# Patient Record
Sex: Female | Born: 1970
Health system: Southern US, Community
[De-identification: ages and names within clinical notes are randomized; demographics above are authoritative.]

## PROBLEM LIST (undated history)

## (undated) DIAGNOSIS — F32A Depression, unspecified: Secondary | ICD-10-CM

## (undated) DIAGNOSIS — N809 Endometriosis, unspecified: Secondary | ICD-10-CM

## (undated) DIAGNOSIS — K219 Gastro-esophageal reflux disease without esophagitis: Secondary | ICD-10-CM

## (undated) DIAGNOSIS — N6019 Diffuse cystic mastopathy of unspecified breast: Secondary | ICD-10-CM

## (undated) DIAGNOSIS — J45909 Unspecified asthma, uncomplicated: Secondary | ICD-10-CM

## (undated) DIAGNOSIS — R51 Headache: Secondary | ICD-10-CM

## (undated) DIAGNOSIS — F419 Anxiety disorder, unspecified: Secondary | ICD-10-CM

## (undated) DIAGNOSIS — R519 Headache, unspecified: Secondary | ICD-10-CM

## (undated) DIAGNOSIS — F329 Major depressive disorder, single episode, unspecified: Secondary | ICD-10-CM

## (undated) HISTORY — PX: EYE SURGERY: SHX253

## (undated) HISTORY — DX: Gastro-esophageal reflux disease without esophagitis: K21.9

## (undated) HISTORY — PX: ABDOMINAL HYSTERECTOMY: SHX81

## (undated) HISTORY — DX: Endometriosis, unspecified: N80.9

## (undated) HISTORY — DX: Unspecified asthma, uncomplicated: J45.909

## (undated) HISTORY — PX: OTHER SURGICAL HISTORY: SHX169

## (undated) HISTORY — DX: Diffuse cystic mastopathy of unspecified breast: N60.19

---

## 2008-10-23 ENCOUNTER — Ambulatory Visit: Payer: Self-pay | Admitting: Family Medicine

## 2009-12-12 DIAGNOSIS — J45909 Unspecified asthma, uncomplicated: Secondary | ICD-10-CM

## 2009-12-12 HISTORY — DX: Unspecified asthma, uncomplicated: J45.909

## 2009-12-16 DIAGNOSIS — K219 Gastro-esophageal reflux disease without esophagitis: Secondary | ICD-10-CM | POA: Insufficient documentation

## 2010-02-02 ENCOUNTER — Ambulatory Visit: Payer: Self-pay | Admitting: Family Medicine

## 2010-05-03 ENCOUNTER — Emergency Department: Payer: Self-pay | Admitting: Emergency Medicine

## 2011-10-31 ENCOUNTER — Ambulatory Visit: Payer: Self-pay | Admitting: Family Medicine

## 2012-07-17 ENCOUNTER — Ambulatory Visit: Payer: Self-pay | Admitting: Family Medicine

## 2012-07-19 HISTORY — PX: UPPER GI ENDOSCOPY: SHX6162

## 2012-09-12 DIAGNOSIS — H16409 Unspecified corneal neovascularization, unspecified eye: Secondary | ICD-10-CM | POA: Insufficient documentation

## 2013-01-07 HISTORY — PX: CORNEAL TRANSPLANT: SHX108

## 2013-10-02 ENCOUNTER — Encounter: Payer: Self-pay | Admitting: General Surgery

## 2013-10-28 ENCOUNTER — Ambulatory Visit: Payer: Self-pay | Admitting: General Surgery

## 2013-11-21 ENCOUNTER — Ambulatory Visit: Payer: Self-pay | Admitting: Family Medicine

## 2013-11-21 LAB — HM MAMMOGRAPHY
HM Mammogram: ABNORMAL
HM Mammogram: ABNORMAL

## 2014-04-09 ENCOUNTER — Ambulatory Visit: Payer: Self-pay | Admitting: Family Medicine

## 2014-08-04 ENCOUNTER — Ambulatory Visit: Payer: Self-pay | Admitting: Family Medicine

## 2014-08-08 LAB — LIPID PANEL
Cholesterol: 197 mg/dL (ref 0–200)
HDL: 58 mg/dL (ref 35–70)
LDL Cholesterol: 124 mg/dL
Triglycerides: 74 mg/dL (ref 40–160)

## 2014-08-08 LAB — HM PAP SMEAR: HM PAP: NORMAL

## 2014-09-29 ENCOUNTER — Ambulatory Visit: Payer: Self-pay | Admitting: Family Medicine

## 2015-03-31 ENCOUNTER — Encounter: Payer: Self-pay | Admitting: Family Medicine

## 2015-05-13 ENCOUNTER — Ambulatory Visit (INDEPENDENT_AMBULATORY_CARE_PROVIDER_SITE_OTHER): Payer: Managed Care, Other (non HMO) | Admitting: Family Medicine

## 2015-05-13 ENCOUNTER — Encounter: Payer: Self-pay | Admitting: Family Medicine

## 2015-05-13 VITALS — BP 122/78 | HR 79 | Temp 98.0°F | Resp 16 | Ht 65.0 in | Wt 123.7 lb

## 2015-05-13 DIAGNOSIS — R350 Frequency of micturition: Secondary | ICD-10-CM | POA: Diagnosis not present

## 2015-05-13 DIAGNOSIS — R1010 Upper abdominal pain, unspecified: Secondary | ICD-10-CM

## 2015-05-13 DIAGNOSIS — R109 Unspecified abdominal pain: Secondary | ICD-10-CM | POA: Insufficient documentation

## 2015-05-13 DIAGNOSIS — R110 Nausea: Secondary | ICD-10-CM

## 2015-05-13 LAB — POCT URINALYSIS DIPSTICK
BILIRUBIN UA: NEGATIVE
GLUCOSE UA: NEGATIVE
KETONES UA: NEGATIVE
Leukocytes, UA: NEGATIVE
Nitrite, UA: NEGATIVE
PH UA: 6
Protein, UA: NEGATIVE
RBC UA: NEGATIVE
Spec Grav, UA: 1.01
Urobilinogen, UA: 0.2

## 2015-05-13 MED ORDER — CIPROFLOXACIN HCL 500 MG PO TABS: 500.0000 mg | ORAL_TABLET | Freq: Two times a day (BID) | ORAL | Status: DC

## 2015-05-13 MED ORDER — NITROFURANTOIN MONOHYD MACRO 100 MG PO CAPS: 100.0000 mg | ORAL_CAPSULE | Freq: Two times a day (BID) | ORAL | Status: DC

## 2015-05-13 MED ORDER — PROMETHAZINE HCL 25 MG PO TABS: 25.0000 mg | ORAL_TABLET | Freq: Two times a day (BID) | ORAL | Status: DC | PRN

## 2015-05-13 NOTE — Patient Instructions (Signed)
Pyelonephritis, Adult °Pyelonephritis is a kidney infection. In general, there are 2 main types of pyelonephritis: °· Infections that come on quickly without any warning (acute pyelonephritis). °· Infections that persist for a long period of time (chronic pyelonephritis). °CAUSES  °Two main causes of pyelonephritis are: °· Bacteria traveling from the bladder to the kidney. This is a problem especially in pregnant women. The urine in the bladder can become filled with bacteria from multiple causes, including: °¨ Inflammation of the prostate gland (prostatitis). °¨ Sexual intercourse in females. °¨ Bladder infection (cystitis). °· Bacteria traveling from the bloodstream to the tissue part of the kidney. °Problems that may increase your risk of getting a kidney infection include: °· Diabetes. °· Kidney stones or bladder stones. °· Cancer. °· Catheters placed in the bladder. °· Other abnormalities of the kidney or ureter. °SYMPTOMS  °· Abdominal pain. °· Pain in the side or flank area. °· Fever. °· Chills. °· Upset stomach. °· Blood in the urine (dark urine). °· Frequent urination. °· Strong or persistent urge to urinate. °· Burning or stinging when urinating. °DIAGNOSIS  °Your caregiver may diagnose your kidney infection based on your symptoms. A urine sample may also be taken. °TREATMENT  °In general, treatment depends on how severe the infection is.  °· If the infection is mild and caught early, your caregiver may treat you with oral antibiotics and send you home. °· If the infection is more severe, the bacteria may have gotten into the bloodstream. This will require intravenous (IV) antibiotics and a hospital stay. Symptoms may include: °¨ High fever. °¨ Severe flank pain. °¨ Shaking chills. °· Even after a hospital stay, your caregiver may require you to be on oral antibiotics for a period of time. °· Other treatments may be required depending upon the cause of the infection. °HOME CARE INSTRUCTIONS  °· Take your  antibiotics as directed. Finish them even if you start to feel better. °· Make an appointment to have your urine checked to make sure the infection is gone. °· Drink enough fluids to keep your urine clear or pale yellow. °· Take medicines for the bladder if you have urgency and frequency of urination as directed by your caregiver. °SEEK IMMEDIATE MEDICAL CARE IF:  °· You have a fever or persistent symptoms for more than 2-3 days. °· You have a fever and your symptoms suddenly get worse. °· You are unable to take your antibiotics or fluids. °· You develop shaking chills. °· You experience extreme weakness or fainting. °· There is no improvement after 2 days of treatment. °MAKE SURE YOU: °· Understand these instructions. °· Will watch your condition. °· Will get help right away if you are not doing well or get worse. °Document Released: 11/28/2005 Document Revised: 05/29/2012 Document Reviewed: 05/04/2011 °[employer]® Patient Information ©2015 [employer]. This information is not intended to replace advice given to you by your health care provider. Make sure you discuss any questions you have with your health care provider. ° °

## 2015-05-13 NOTE — Progress Notes (Signed)
Name: Jessica Carroll   MRN: [id number]    DOB: 12/06/1971   Date:05/13/2015       Progress Note  Subjective  Chief Complaint  Chief Complaint  Patient presents with  . Urinary Tract Infection    3 days    HPI  Jessica Carroll came in today complaining of burning when she voids that started three days ago. Yesterday the symptoms got worse, including urinary frequency and incomplete void. She got up to void three times. No blood in urine but she has noticed back pain and low abdominal pain and nausea.    History  Substance Use Topics  . Smoking status: Never Smoker   . Smokeless tobacco: Never Used  . Alcohol Use: No     Current outpatient prescriptions:  .  albuterol (PROVENTIL HFA;VENTOLIN HFA) 108 (90 BASE) MCG/ACT inhaler, Inhale 2 puffs into the lungs as needed., Disp: , Rfl:  .  ALPRAZolam (XANAX) 0.5 MG tablet, Take 1 tablet by mouth as needed. For sleep, Disp: , Rfl:  .  Cyanocobalamin 2500 MCG SUBL, Place 1 tablet under the tongue daily., Disp: , Rfl:  .  cyclobenzaprine (FLEXERIL) 10 MG tablet, Take 1 tablet by mouth as needed., Disp: , Rfl:  .  dexlansoprazole (DEXILANT) 60 MG capsule, Take 1 tablet by mouth daily., Disp: , Rfl:  .  diclofenac sodium (VOLTAREN) 1 % GEL, Place 1 Bottle onto the skin as needed. Apply to back for pain, Disp: , Rfl:  .  fluticasone (FLONASE) 50 MCG/ACT nasal spray, Place 2 sprays into the nose as needed., Disp: , Rfl:  .  Fluticasone-Salmeterol (ADVAIR) 250-50 MCG/DOSE AEPB, Inhale 2 puffs into the lungs 2 (two) times daily., Disp: , Rfl:  .  ibuprofen (ADVIL,MOTRIN) 800 MG tablet, Take 1 tablet by mouth 3 (three) times daily as needed., Disp: , Rfl:  .  promethazine (PHENERGAN) 25 MG tablet, Take 1 tablet by mouth 2 (two) times daily as needed., Disp: , Rfl:  .  rizatriptan (MAXALT) 10 MG tablet, Take 1 tablet by mouth 2 (two) times daily as needed., Disp: , Rfl:  .  triamcinolone cream (KENALOG) 0.1 %, 1 application as needed., Disp: , Rfl:  .   MULTIPLE VITAMIN PO, Take 1 tablet by mouth daily., Disp: , Rfl:  .  tacrolimus (PROTOPIC) 0.03 % ointment, 1 drop 2 (two) times daily., Disp: , Rfl:  .  valACYclovir (VALTREX) 1000 MG tablet, Take 1 tablet by mouth 2 (two) times daily., Disp: , Rfl:   Allergies  Allergen Reactions  . Penicillins Nausea Only    ROS  Ten systems reviewed and is negative except as mentioned in HPI.  Objective  Filed Vitals:   05/13/15 1411  BP: 122/78  Pulse: 79  Temp: 98 F (36.7 C)  TempSrc: Oral  Resp: 16  Height: 5\' 5"  (1.651 m)  Weight: 123 lb 11.2 oz (56.11 kg)  SpO2: 98%     Physical Exam Constitutional: Patient appears well-developed and well-nourished. No distress.  Neck: Normal range of motion. Neck supple. No JVD present. No thyromegaly present.  Cardiovascular: Normal rate, regular rhythm and normal heart sounds.  No murmur heard. No BLE edema. Pulmonary/Chest: Effort normal and breath sounds normal. No respiratory distress. Abdominal: Soft with tenderness to palpation of epigastric area, suprapubic and also left flank, positive left CVA tenderness Musculoskeletal: Normal range of motion, no joint effusions. No gross deformities Neurological: he is alert and oriented to person, place, and time. No cranial nerve deficit. Coordination, balance,  strength, speech and gait are normal.  Skin: Skin is warm and dry. No rash noted. No erythema.  Psychiatric: Patient has a normal mood and affect. behavior is normal. Judgment and thought content normal.  Recent Results (from the past 2160 hour(s))  POCT urinalysis dipstick     Status: Normal   Collection Time: 05/13/15  2:43 PM  Result Value Ref Range   Color, UA yellow    Clarity, UA clear    Glucose, UA neg    Bilirubin, UA neg    Ketones, UA neg    Spec Grav, UA 1.010    Blood, UA neg    pH, UA 6.0    Protein, UA neg    Urobilinogen, UA 0.2    Nitrite, UA neg    Leukocytes, UA Negative      Assessment & Plan  1. Urinary  frequency Likely UTI, possible pyelonephritis. She has CVA tenderness on left side, nausea and left flank pain during exam. , we will send antibiotics and urine for culture , advised patient to call back if no improvement in 24 hours and we will order CT scan - POCT urinalysis dipstick - Urine culture  2. Flank pain, acute Possible pyelonephritis, versus mass, versus stone, go to [facility] if not better  3. Nausea We will give her prn medication call back if no improvement

## 2015-05-14 ENCOUNTER — Other Ambulatory Visit: Payer: Self-pay | Admitting: Family Medicine

## 2015-05-17 LAB — URINE CULTURE: Organism ID, Bacteria: NO GROWTH

## 2015-05-18 ENCOUNTER — Ambulatory Visit (INDEPENDENT_AMBULATORY_CARE_PROVIDER_SITE_OTHER): Payer: Managed Care, Other (non HMO) | Admitting: Family Medicine

## 2015-05-18 VITALS — BP 116/78 | HR 73 | Temp 98.1°F | Resp 14 | Ht 66.0 in | Wt 124.5 lb

## 2015-05-18 DIAGNOSIS — R110 Nausea: Secondary | ICD-10-CM

## 2015-05-18 DIAGNOSIS — Z3002 Counseling and instruction in natural family planning to avoid pregnancy: Secondary | ICD-10-CM

## 2015-05-18 DIAGNOSIS — M549 Dorsalgia, unspecified: Secondary | ICD-10-CM | POA: Diagnosis not present

## 2015-05-18 DIAGNOSIS — H168 Other keratitis: Secondary | ICD-10-CM | POA: Insufficient documentation

## 2015-05-18 DIAGNOSIS — R39859 Costovertebral (angle) tenderness, unspecified side: Secondary | ICD-10-CM

## 2015-05-18 DIAGNOSIS — H02409 Unspecified ptosis of unspecified eyelid: Secondary | ICD-10-CM | POA: Insufficient documentation

## 2015-05-18 DIAGNOSIS — D229 Melanocytic nevi, unspecified: Secondary | ICD-10-CM | POA: Insufficient documentation

## 2015-05-18 DIAGNOSIS — J45998 Other asthma: Secondary | ICD-10-CM

## 2015-05-18 DIAGNOSIS — E538 Deficiency of other specified B group vitamins: Secondary | ICD-10-CM

## 2015-05-18 DIAGNOSIS — L309 Dermatitis, unspecified: Secondary | ICD-10-CM | POA: Insufficient documentation

## 2015-05-18 DIAGNOSIS — G4700 Insomnia, unspecified: Secondary | ICD-10-CM | POA: Insufficient documentation

## 2015-05-18 DIAGNOSIS — J309 Allergic rhinitis, unspecified: Secondary | ICD-10-CM | POA: Insufficient documentation

## 2015-05-18 DIAGNOSIS — R300 Dysuria: Secondary | ICD-10-CM

## 2015-05-18 DIAGNOSIS — R1012 Left upper quadrant pain: Secondary | ICD-10-CM

## 2015-05-18 DIAGNOSIS — Z947 Corneal transplant status: Secondary | ICD-10-CM | POA: Insufficient documentation

## 2015-05-18 DIAGNOSIS — G43009 Migraine without aura, not intractable, without status migrainosus: Secondary | ICD-10-CM | POA: Insufficient documentation

## 2015-05-18 DIAGNOSIS — F419 Anxiety disorder, unspecified: Secondary | ICD-10-CM | POA: Diagnosis not present

## 2015-05-18 DIAGNOSIS — B9789 Other viral agents as the cause of diseases classified elsewhere: Secondary | ICD-10-CM | POA: Insufficient documentation

## 2015-05-18 DIAGNOSIS — J45909 Unspecified asthma, uncomplicated: Secondary | ICD-10-CM | POA: Insufficient documentation

## 2015-05-18 MED ORDER — MEDROXYPROGESTERONE ACETATE 150 MG/ML IM SUSP: 150.0000 mg | Freq: Once | INTRAMUSCULAR | Status: DC

## 2015-05-18 MED ORDER — RANITIDINE HCL 300 MG PO CAPS: 300.0000 mg | ORAL_CAPSULE | Freq: Every evening | ORAL | Status: DC

## 2015-05-18 NOTE — Patient Instructions (Signed)

## 2015-05-18 NOTE — Progress Notes (Signed)
Name: Jessica Carroll   MRN: [id number]    DOB: 03/27/1971   Date:05/18/2015       Progress Note  Subjective  Chief Complaint  Chief Complaint  Patient presents with  . Medication Refill    3 month F/U  . Anxiety    Stopped Paxil since last visit, unchanged takes Xanax as needed  . Migraine    1 in the past month  . Asthma    Improving  . Urinary Tract Infection    Still having back pain and a little burning    HPI  Dysuria:  Symptoms started 6 days ago, and it was associated with frequency, had back pain and also nausea.  She is on Cipro since last week, still has some discomfort but has improved. She still has right CVA tenderness. No fever  Nausea: having some epigastric pain that radiates to LUQ, associated with nausea after meals. She is taking Dexilant but does not seem to help with symptoms. No previous history of pancreatitis. No change in bowel movements. Pain radiates to her back at times  Asthma Moderate Persistent: it is under control with Advair that she is very compliant, but symptoms present when she skips medications. Denies any recent exacerbation  Depression/anxiety: she failed PHQ9, but she does not want to take an antidepressant, afraid of side effects, she is only taking prn Alprazolam now Prn sleep   Contraception: she was on Depo and stopped because of irregular cycles, changed to ocp's but it caused nausea, discussed IUD and Nexplanon - she wants to try Depo again.   Patient Active Problem List   Diagnosis Date Noted  . B12 deficiency 05/18/2015  . Back ache 05/18/2015  . Asthma, moderate 05/18/2015  . Anxiety 05/18/2015  . Insomnia, persistent 05/18/2015  . Difficult or painful urination 05/18/2015  . Eczema of hand 05/18/2015  . Viral keratitis 05/18/2015  . Common migraine with intractable migraine 05/18/2015  . Nausea 05/18/2015  . Numerous moles 05/18/2015  . Blepharoptosis 05/18/2015  . Allergic rhinitis 05/18/2015  . History of corneal  transplant 05/18/2015  . Gastro-esophageal reflux disease without esophagitis 12/16/2009    Past Surgical History  Procedure Laterality Date  . Upper gi endoscopy  07/19/2012  . Corneal transplant Right 01/07/2013    Family History  Problem Relation Age of Onset  . Adopted: Yes  . Hyperlipidemia Mother   . Hypertension Mother   . Diabetes Father   . Heart failure Father 51    hear attack    History   Social History  . Marital Status: Single    Spouse Name: N/A  . Number of Children: N/A  . Years of Education: 12   Occupational History  . textile    Social History Main Topics  . Smoking status: Never Smoker   . Smokeless tobacco: Never Used  . Alcohol Use: No  . Drug Use: No  . Sexual Activity:    Partners: Male   Other Topics Concern  . Not on file   Social History Narrative     Current outpatient prescriptions:  .  albuterol (PROVENTIL HFA;VENTOLIN HFA) 108 (90 BASE) MCG/ACT inhaler, Inhale 2 puffs into the lungs as needed., Disp: , Rfl:  .  ALPRAZolam (XANAX) 0.5 MG tablet, Take 1 tablet by mouth as needed. For sleep, Disp: , Rfl:  .  ciprofloxacin (CIPRO) 500 MG tablet, Take 1 tablet (500 mg total) by mouth 2 (two) times daily., Disp: 20 tablet, Rfl: 0 .  Cyanocobalamin  2500 MCG SUBL, Place 1 tablet under the tongue daily., Disp: , Rfl:  .  cyclobenzaprine (FLEXERIL) 10 MG tablet, Take 1 tablet by mouth as needed., Disp: , Rfl:  .  dexlansoprazole (DEXILANT) 60 MG capsule, Take 1 tablet by mouth daily., Disp: , Rfl:  .  diclofenac sodium (VOLTAREN) 1 % GEL, Place 1 Bottle onto the skin as needed. Apply to back for pain, Disp: , Rfl:  .  fluticasone (FLONASE) 50 MCG/ACT nasal spray, Place 2 sprays into the nose as needed., Disp: , Rfl:  .  Fluticasone-Salmeterol (ADVAIR) 250-50 MCG/DOSE AEPB, Inhale 2 puffs into the lungs 2 (two) times daily., Disp: , Rfl:  .  ibuprofen (ADVIL,MOTRIN) 800 MG tablet, Take 1 tablet by mouth 3 (three) times daily as needed.,  Disp: , Rfl:  .  MULTIPLE VITAMIN PO, Take 1 tablet by mouth daily., Disp: , Rfl:  .  promethazine (PHENERGAN) 25 MG tablet, Take 1 tablet (25 mg total) by mouth 2 (two) times daily as needed for nausea., Disp: 30 tablet, Rfl: 1 .  rizatriptan (MAXALT) 10 MG tablet, Take 1 tablet by mouth 2 (two) times daily as needed., Disp: , Rfl:  .  tacrolimus (PROTOPIC) 0.03 % ointment, 1 drop 2 (two) times daily., Disp: , Rfl:  .  triamcinolone cream (KENALOG) 0.1 %, 1 application as needed., Disp: , Rfl:  .  valACYclovir (VALTREX) 1000 MG tablet, Take 1 tablet by mouth 2 (two) times daily., Disp: , Rfl:   Allergies  Allergen Reactions  . Penicillins Nausea Only     ROS  Constitutional: Negative for fever or weight change.  Respiratory: Negative for cough and shortness of breath.   Cardiovascular: Negative for chest pain or palpitations.  Gastrointestinal: per hpi  Musculoskeletal: Negative for gait problem or joint swelling. Back pain  Skin: Negative for rash.  Neurological: Negative for dizziness or headache - migraine is under control .  No other specific complaints in a complete review of systems (except as listed in HPI above).  Objective  Filed Vitals:   05/18/15 1426  BP: 116/78  Pulse: 73  Temp: 98.1 F (36.7 C)  TempSrc: Oral  Resp: 14  Height: 5\' 6"  (1.676 m)  Weight: 124 lb 8 oz (56.473 kg)  SpO2: 98%    Physical Exam   Constitutional: Patient appears well-developed and well-nourished. No distress.  HENT: Head: Normocephalic and atraumatic.Nose: Nose normal. Mouth/Throat: Oropharynx is clear and moist. No oropharyngeal exudate.  Eyes: Conjunctivae and EOM are normal. Pupils are equal, round, and reactive to light. No scleral icterus. Ptosis of right eye Neck: Normal range of motion. Neck supple. No JVD present. No thyromegaly present.  Cardiovascular: Normal rate, regular rhythm and normal heart sounds.  No murmur heard. No BLE edema. Pulmonary/Chest: Effort normal and  breath sounds normal. No respiratory distress. Abdominal: Soft, tender on epigastric and LUQ, also Left CVA pain to palpation Musculoskeletal: Normal range of motion, no joint effusions. No gross deformities Neurological: he is alert and oriented to person, place, and time. No cranial nerve deficit. Coordination, balance, strength, speech and gait are normal.  Skin: Skin is warm and dry. No rash noted. No erythema.  Psychiatric: Patient has a normal mood and affect. behavior is normal. Judgment and thought content normal.  Recent Results (from the past 2160 hour(s))  POCT urinalysis dipstick     Status: Normal   Collection Time: 05/13/15  2:43 PM  Result Value Ref Range   Color, UA yellow  Clarity, UA clear    Glucose, UA neg    Bilirubin, UA neg    Ketones, UA neg    Spec Grav, UA 1.010    Blood, UA neg    pH, UA 6.0    Protein, UA neg    Urobilinogen, UA 0.2    Nitrite, UA neg    Leukocytes, UA Negative    Depression screen PHQ 2/9 05/13/2015  Decreased Interest 2  Down, Depressed, Hopeless 1  PHQ - 2 Score 3  Altered sleeping 1  Tired, decreased energy 3  Change in appetite 2  Feeling bad or failure about yourself  0  Trouble concentrating 0  Moving slowly or fidgety/restless 1  Suicidal thoughts 0  PHQ-9 Score 10  Difficult doing work/chores Somewhat difficult     Assessment & Plan    1. Difficult or painful urination She also has CVA tenderness, and nausea, kidney  stone versus pancreatic disease, previous gallbladder US neg for stones - neg urine culture therefore not pyelonephritis - CT Abd and pelvis with contrast; Future  2. Anxiety Stable, does not want antidepressants, taking medication prn   3. Nausea  Continue Dexilant and add Ranitidine qpm for now Check labs - Lipase - CBC - Comprehensive metabolic panel  4. Asthma, moderate Stable   5. B12 deficiency Recheck level, taking otc medication - B12  6. CVA tenderness  - CT Abd Wo & W Cm;  Future  7. Contraception Discussed options, she wants to try taking Depo again for now

## 2015-05-19 ENCOUNTER — Telehealth: Payer: Self-pay | Admitting: Family Medicine

## 2015-05-19 ENCOUNTER — Ambulatory Visit
Admission: RE | Admit: 2015-05-19 | Discharge: 2015-05-19 | Disposition: A | Discharge status: Home / Self Care | Payer: Managed Care, Other (non HMO) | Source: Ambulatory Visit | Attending: Family Medicine | Admitting: Family Medicine

## 2015-05-19 ENCOUNTER — Other Ambulatory Visit: Payer: Self-pay

## 2015-05-19 ENCOUNTER — Telehealth: Payer: Self-pay

## 2015-05-19 VITALS — Ht 65.5 in | Wt 123.0 lb

## 2015-05-19 DIAGNOSIS — I517 Cardiomegaly: Secondary | ICD-10-CM

## 2015-05-19 DIAGNOSIS — R110 Nausea: Secondary | ICD-10-CM

## 2015-05-19 DIAGNOSIS — M545 Low back pain, unspecified: Secondary | ICD-10-CM

## 2015-05-19 DIAGNOSIS — R300 Dysuria: Secondary | ICD-10-CM | POA: Diagnosis not present

## 2015-05-19 DIAGNOSIS — R1013 Epigastric pain: Secondary | ICD-10-CM | POA: Diagnosis present

## 2015-05-19 DIAGNOSIS — I340 Nonrheumatic mitral (valve) insufficiency: Secondary | ICD-10-CM | POA: Insufficient documentation

## 2015-05-19 DIAGNOSIS — R1012 Left upper quadrant pain: Secondary | ICD-10-CM

## 2015-05-19 LAB — CBC
Hematocrit: 33.5 % — ABNORMAL LOW (ref 34.0–46.6)
Hemoglobin: 11.4 g/dL (ref 11.1–15.9)
MCH: 31.3 pg (ref 26.6–33.0)
MCHC: 34 g/dL (ref 31.5–35.7)
MCV: 92 fL (ref 79–97)
Platelets: 199 10*3/uL (ref 150–379)
RBC: 3.64 x10E6/uL — ABNORMAL LOW (ref 3.77–5.28)
RDW: 13.2 % (ref 12.3–15.4)
WBC: 7.2 10*3/uL (ref 3.4–10.8)

## 2015-05-19 LAB — COMPREHENSIVE METABOLIC PANEL
ALT: 12 IU/L (ref 0–32)
AST: 17 IU/L (ref 0–40)
Albumin/Globulin Ratio: 2.1 (ref 1.1–2.5)
Albumin: 4.7 g/dL (ref 3.5–5.5)
Alkaline Phosphatase: 53 IU/L (ref 39–117)
BILIRUBIN TOTAL: 0.7 mg/dL (ref 0.0–1.2)
BUN / CREAT RATIO: 18 (ref 9–23)
BUN: 12 mg/dL (ref 6–24)
CALCIUM: 9.3 mg/dL (ref 8.7–10.2)
CO2: 22 mmol/L (ref 18–29)
CREATININE: 0.67 mg/dL (ref 0.57–1.00)
Chloride: 103 mmol/L (ref 97–108)
GFR calc Af Amer: 124 mL/min/{1.73_m2} (ref >59)
GFR calc non Af Amer: 107 mL/min/{1.73_m2} (ref >59)
GLOBULIN, TOTAL: 2.2 g/dL (ref 1.5–4.5)
Glucose: 98 mg/dL (ref 65–99)
POTASSIUM: 4.1 mmol/L (ref 3.5–5.2)
Sodium: 141 mmol/L (ref 134–144)
Total Protein: 6.9 g/dL (ref 6.0–8.5)

## 2015-05-19 LAB — LIPASE: Lipase: 43 U/L (ref 0–59)

## 2015-05-19 LAB — VITAMIN B12: Vitamin B-12: 335 pg/mL (ref 211–946)

## 2015-05-19 MED ORDER — IOHEXOL 300 MG/ML  SOLN
100.0000 mL | Freq: Once | INTRAMUSCULAR | Status: AC | PRN
  Administered 2015-05-19: 100 mL via INTRAVENOUS

## 2015-05-19 NOTE — Telephone Encounter (Signed)
ITEM COMPLETED BY SHELLEY

## 2015-05-19 NOTE — Telephone Encounter (Signed)
Patient notified all labs normal.

## 2015-05-20 ENCOUNTER — Telehealth: Payer: Self-pay

## 2015-05-20 NOTE — Telephone Encounter (Signed)
Patient notified CT scan normal. But we will setup echo due to cardiomegaly found on CT

## 2015-05-20 NOTE — Telephone Encounter (Signed)
-----   Message from Steele Sizer, MD sent at 05/19/2015 11:58 AM EDT ----- CT was normal, except that she was found to have mild cardiomegaly - heart is a little enlarged. We need to order and Echo, but not likely the cause of her symptoms

## 2015-06-05 ENCOUNTER — Other Ambulatory Visit: Payer: Self-pay | Admitting: Family Medicine

## 2015-06-05 NOTE — Telephone Encounter (Signed)
Patient requesting refill. 

## 2015-06-08 ENCOUNTER — Ambulatory Visit
Admission: RE | Admit: 2015-06-08 | Discharge: 2015-06-08 | Disposition: A | Discharge status: Home / Self Care | Payer: Managed Care, Other (non HMO) | Source: Ambulatory Visit | Attending: Family Medicine | Admitting: Family Medicine

## 2015-06-08 DIAGNOSIS — I517 Cardiomegaly: Secondary | ICD-10-CM

## 2015-06-08 NOTE — Progress Notes (Signed)
*  PRELIMINARY RESULTS* Echocardiogram 2D Echocardiogram has been performed.  Jessica Carroll 06/08/2015, 11:50 AM

## 2015-06-09 ENCOUNTER — Telehealth: Payer: Self-pay

## 2015-06-09 NOTE — Telephone Encounter (Signed)
Patient notified of results.

## 2015-06-11 ENCOUNTER — Telehealth: Payer: Self-pay | Admitting: Family Medicine

## 2015-06-11 NOTE — Telephone Encounter (Signed)
Yes she can continue all medications, incidental finding. Not to worry about that at all. Thank you.

## 2015-06-11 NOTE — Telephone Encounter (Signed)
Please advise 

## 2015-06-11 NOTE — Telephone Encounter (Signed)
PT WANTS TO KNOW IF DUE TO THE FINDINGS ABOUT HER HEART IF SHE CAN STILL HAVE THE DEPO SHOT OR NOT. PLEASE ADVISE

## 2015-06-12 NOTE — Telephone Encounter (Signed)
Patient notified

## 2015-06-16 ENCOUNTER — Ambulatory Visit: Payer: Managed Care, Other (non HMO)

## 2015-06-19 HISTORY — PX: OTHER SURGICAL HISTORY: SHX169

## 2015-08-14 ENCOUNTER — Encounter: Payer: Self-pay | Admitting: Family Medicine

## 2015-08-14 ENCOUNTER — Ambulatory Visit (INDEPENDENT_AMBULATORY_CARE_PROVIDER_SITE_OTHER): Payer: Managed Care, Other (non HMO) | Admitting: Family Medicine

## 2015-08-14 VITALS — BP 116/64 | HR 80 | Temp 98.0°F | Resp 16 | Ht 66.0 in | Wt 120.4 lb

## 2015-08-14 DIAGNOSIS — M67431 Ganglion, right wrist: Secondary | ICD-10-CM | POA: Insufficient documentation

## 2015-08-14 DIAGNOSIS — Z3042 Encounter for surveillance of injectable contraceptive: Secondary | ICD-10-CM | POA: Diagnosis not present

## 2015-08-14 DIAGNOSIS — Z13220 Encounter for screening for lipoid disorders: Secondary | ICD-10-CM

## 2015-08-14 DIAGNOSIS — Z79899 Other long term (current) drug therapy: Secondary | ICD-10-CM | POA: Diagnosis not present

## 2015-08-14 DIAGNOSIS — M67432 Ganglion, left wrist: Secondary | ICD-10-CM

## 2015-08-14 DIAGNOSIS — E538 Deficiency of other specified B group vitamins: Secondary | ICD-10-CM

## 2015-08-14 DIAGNOSIS — Z0000 Encounter for general adult medical examination without abnormal findings: Secondary | ICD-10-CM

## 2015-08-14 DIAGNOSIS — G43009 Migraine without aura, not intractable, without status migrainosus: Secondary | ICD-10-CM | POA: Diagnosis not present

## 2015-08-14 DIAGNOSIS — Z124 Encounter for screening for malignant neoplasm of cervix: Secondary | ICD-10-CM | POA: Diagnosis not present

## 2015-08-14 DIAGNOSIS — B373 Candidiasis of vulva and vagina: Secondary | ICD-10-CM

## 2015-08-14 DIAGNOSIS — G4700 Insomnia, unspecified: Secondary | ICD-10-CM

## 2015-08-14 DIAGNOSIS — K5909 Other constipation: Secondary | ICD-10-CM | POA: Insufficient documentation

## 2015-08-14 DIAGNOSIS — Z01419 Encounter for gynecological examination (general) (routine) without abnormal findings: Secondary | ICD-10-CM

## 2015-08-14 DIAGNOSIS — K5900 Constipation, unspecified: Secondary | ICD-10-CM | POA: Diagnosis not present

## 2015-08-14 DIAGNOSIS — Z1239 Encounter for other screening for malignant neoplasm of breast: Secondary | ICD-10-CM | POA: Diagnosis not present

## 2015-08-14 DIAGNOSIS — Z309 Encounter for contraceptive management, unspecified: Secondary | ICD-10-CM | POA: Insufficient documentation

## 2015-08-14 DIAGNOSIS — B3731 Acute candidiasis of vulva and vagina: Secondary | ICD-10-CM | POA: Insufficient documentation

## 2015-08-14 MED ORDER — RIZATRIPTAN BENZOATE 10 MG PO TABS: 10.0000 mg | ORAL_TABLET | Freq: Two times a day (BID) | ORAL | Status: DC | PRN

## 2015-08-14 MED ORDER — CYANOCOBALAMIN 1000 MCG/ML IJ SOLN
1000.0000 ug | Freq: Once | INTRAMUSCULAR | Status: AC
  Administered 2015-08-14: 1000 ug via INTRAMUSCULAR

## 2015-08-14 MED ORDER — FLUCONAZOLE 150 MG PO TABS
150.0000 mg | ORAL_TABLET | ORAL | Status: DC
Start: 2015-08-14 — End: 2015-10-13

## 2015-08-14 MED ORDER — MEDROXYPROGESTERONE ACETATE 150 MG/ML IM SUSP
150.0000 mg | Freq: Once | INTRAMUSCULAR | Status: AC
  Administered 2015-08-14: 150 mg via INTRAMUSCULAR

## 2015-08-14 MED ORDER — LINACLOTIDE 145 MCG PO CAPS: 145.0000 ug | ORAL_CAPSULE | Freq: Every day | ORAL | Status: DC

## 2015-08-14 MED ORDER — ALPRAZOLAM 0.5 MG PO TABS: 0.5000 mg | ORAL_TABLET | Freq: Every evening | ORAL | Status: DC | PRN

## 2015-08-14 NOTE — Progress Notes (Signed)
Name: Jessica Carroll   MRN: [id number]    DOB: 1971-03-12   Date:08/14/2015       Progress Note  Subjective  Chief Complaint  Chief Complaint  Patient presents with  . Annual Exam  also here for her regular follow up and refill of medications  HPI  Well Woman: she has not started Depo yet, but will get first shot today. Discussed risk of osteoporosis once more. The importance of increasing dietary calcium and vitamin D intake. Cycles are regular, she has some vaginal dryness.  Constipation: she used to take Linzess but has been off medication, stools are hard, Bristol scale of 1-2, and has noticed bloating over the past month, bowel movements are every other day, would like to resume medication  Migraine: she has episodes without aura, about once or twice monthly, Maxalt usually improves the symptoms within a few hours. She has photophobia and phonophobia, she also has nausea at times.   B12 deficiency: taking otc but level was still low and we will start injections  Insomnia: takes Alprazolam prn and is doing well.   Stress: boyfriend recently diagnosed with NASH and mild cirrhosis of the liver this week, mother's dog was diagnosed with cancer and will be put to sleep today. She is upset about it.   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 Chronic constipation 08/14/2015  . Contraception management 08/14/2015  . Yeast vaginitis 08/14/2015  . Cardiomegaly 05/19/2015  . B12 deficiency 05/18/2015  . Back ache 05/18/2015  . Asthma, moderate 05/18/2015  . Anxiety 05/18/2015  . Insomnia, persistent 05/18/2015  . Eczema of hand 05/18/2015  . Viral keratitis  05/18/2015  . Migraine without aura and without status migrainosus, not intractable 05/18/2015  . Numerous moles 05/18/2015  . Allergic rhinitis 05/18/2015  . History of corneal transplant 05/18/2015  . Gastro-esophageal reflux disease without esophagitis 12/16/2009    Past Surgical History  Procedure Laterality Date  . Upper gi endoscopy  07/19/2012  . Corneal transplant Right 01/07/2013  . Eyelid surgery Right June 19, 2015    [facility] by Dr. Norlene Duel    Family History  Problem Relation Age of Onset  . Adopted: Yes  . Hyperlipidemia Mother   . Hypertension Mother   . Diabetes Father   . Heart failure Father 17    hear attack    Social History   Social History  . Marital Status: Single    Spouse Name: N/A  . Number of Children: N/A  . Years of Education: 12   Occupational History  . textile    Social History Main Topics  . Smoking status: Never Smoker   . Smokeless tobacco: Never Used  . Alcohol Use: No  . Drug Use: No  . Sexual Activity:    Partners: Male    Birth Control/ Protection: None   Other Topics Concern  . Not on file   Social History Narrative     Current outpatient prescriptions:  .  albuterol (PROVENTIL HFA;VENTOLIN HFA) 108 (90 BASE) MCG/ACT inhaler, Inhale 2 puffs into the lungs as needed., Disp: , Rfl:  .  ALPRAZolam Duanne Moron)  0.5 MG tablet, Take 1 tablet (0.5 mg total) by mouth at bedtime as needed. For sleep, Disp: 30 tablet, Rfl: 0 .  Cyanocobalamin 2500 MCG SUBL, Place 1 tablet under the tongue daily., Disp: , Rfl:  .  cyclobenzaprine (FLEXERIL) 10 MG tablet, Take 1 tablet by mouth as needed., Disp: , Rfl:  .  DEXILANT 60 MG capsule, take 1 capsule by mouth once daily IN PLACE OF NEXIUM, Disp: 30 capsule, Rfl: 6 .  diclofenac sodium (VOLTAREN) 1 % GEL, Place 1 Bottle onto the skin as needed. Apply to back for pain, Disp: , Rfl:  .  fluconazole (DIFLUCAN) 150 MG tablet, Take 1 tablet (150 mg total) by mouth once a week., Disp: 4 tablet,  Rfl: 2 .  fluticasone (FLONASE) 50 MCG/ACT nasal spray, Place 2 sprays into the nose as needed., Disp: , Rfl:  .  Fluticasone-Salmeterol (ADVAIR) 250-50 MCG/DOSE AEPB, Inhale 2 puffs into the lungs 2 (two) times daily., Disp: , Rfl:  .  ibuprofen (ADVIL,MOTRIN) 800 MG tablet, Take 1 tablet by mouth 3 (three) times daily as needed., Disp: , Rfl:  .  medroxyPROGESTERone (DEPO-PROVERA) 150 MG/ML injection, Inject 1 mL (150 mg total) into the muscle once., Disp: 1 mL, Rfl: 12 .  MULTIPLE VITAMIN PO, Take 1 tablet by mouth daily., Disp: , Rfl:  .  PROCTOFOAM HC rectal foam, , Disp: , Rfl: 0 .  promethazine (PHENERGAN) 25 MG tablet, Take 1 tablet (25 mg total) by mouth 2 (two) times daily as needed for nausea., Disp: 30 tablet, Rfl: 1 .  ranitidine (ZANTAC) 300 MG capsule, Take 1 capsule (300 mg total) by mouth every evening., Disp: 30 capsule, Rfl: 0 .  rizatriptan (MAXALT) 10 MG tablet, Take 1 tablet (10 mg total) by mouth 2 (two) times daily as needed., Disp: 10 tablet, Rfl: 2 .  triamcinolone cream (KENALOG) 0.1 %, 1 application as needed., Disp: , Rfl:  .  valACYclovir (VALTREX) 1000 MG tablet, Take 1 tablet by mouth 2 (two) times daily., Disp: , Rfl:  .  Linaclotide (LINZESS) 145 MCG CAPS capsule, Take 1 capsule (145 mcg total) by mouth daily., Disp: 30 capsule, Rfl: 2  Allergies  Allergen Reactions  . Penicillins Nausea Only     ROS  Constitutional: Negative for fever or weight change.  Respiratory: Negative for cough and shortness of breath.   Cardiovascular: Negative for chest pain or palpitations.  Gastrointestinal: positive  for abdominal pain, and constipation is worse.  Musculoskeletal: Negative for gait problem or joint swelling.  Skin: Negative for rash.  Neurological: Negative for dizziness or headache.  No other specific complaints in a complete review of systems (except as listed in HPI above).   Objective  Filed Vitals:   08/14/15 0821  BP: 116/64  Pulse: 80  Temp:  98 F (36.7 C)  TempSrc: Oral  Resp: 16  Height: 5\' 6"  (1.676 m)  Weight: 120 lb 6.4 oz (54.613 kg)  SpO2: 97%    Body mass index is 19.44 kg/(m^2).  Physical Exam  Constitutional: Patient appears well-developed and well-nourished. No distress.  HENT: Head: Normocephalic and atraumatic. Ears: B TMs ok, no erythema or effusion; Nose: Nose normal. Mouth/Throat: Oropharynx is clear and moist. No oropharyngeal exudate.  Eyes: Conjunctivae and EOM are normal. Pupils are equal, round, and reactive to light. No scleral icterus. Ptosis corrected, small scar on palpebra  Neck: Normal range of motion. Neck supple. No JVD present. No thyromegaly present.  Cardiovascular: Normal rate, regular rhythm and  normal heart sounds.  No murmur heard. No BLE edema. Pulmonary/Chest: Effort normal and breath sounds normal. No respiratory distress. Abdominal: Soft. Bowel sounds are normal, no distension. There is no tenderness. no masses Breast: no lumps or masses, no nipple discharge or rashes FEMALE GENITALIA:  Not done, on her cycle RECTAL: not done Musculoskeletal: Normal range of motion, no joint effusions. No gross deformities Neurological: he is alert and oriented to person, place, and time. No cranial nerve deficit. Coordination, balance, strength, speech and gait are normal.  Skin: Skin is warm and dry. No rash noted. No erythema.  Psychiatric: Patient has a normal mood and affect. behavior is normal. Judgment and thought content normal.  Recent Results (from the past 2160 hour(s))  Lipase     Status: None   Collection Time: 05/18/15  3:42 PM  Result Value Ref Range   Lipase 43 0 - 59 U/L  CBC     Status: Abnormal   Collection Time: 05/18/15  3:42 PM  Result Value Ref Range   WBC 7.2 3.4 - 10.8 x10E3/uL   RBC 3.64 (L) 3.77 - 5.28 x10E6/uL   Hemoglobin 11.4 11.1 - 15.9 g/dL   Hematocrit 33.5 (L) 34.0 - 46.6 %   MCV 92 79 - 97 fL   MCH 31.3 26.6 - 33.0 pg   MCHC 34.0 31.5 - 35.7 g/dL   RDW  13.2 12.3 - 15.4 %   Platelets 199 150 - 379 x10E3/uL  Comprehensive metabolic panel     Status: None   Collection Time: 05/18/15  3:42 PM  Result Value Ref Range   Glucose 98 65 - 99 mg/dL   BUN 12 6 - 24 mg/dL   Creatinine, Ser 0.67 0.57 - 1.00 mg/dL   GFR calc non Af Amer 107 >59 mL/min/1.73   GFR calc Af Amer 124 >59 mL/min/1.73   BUN/Creatinine Ratio 18 9 - 23   Sodium 141 134 - 144 mmol/L   Potassium 4.1 3.5 - 5.2 mmol/L   Chloride 103 97 - 108 mmol/L   CO2 22 18 - 29 mmol/L   Calcium 9.3 8.7 - 10.2 mg/dL   Total Protein 6.9 6.0 - 8.5 g/dL   Albumin 4.7 3.5 - 5.5 g/dL   Globulin, Total 2.2 1.5 - 4.5 g/dL   Albumin/Globulin Ratio 2.1 1.1 - 2.5   Bilirubin Total 0.7 0.0 - 1.2 mg/dL   Alkaline Phosphatase 53 39 - 117 IU/L   AST 17 0 - 40 IU/L   ALT 12 0 - 32 IU/L  B12     Status: None   Collection Time: 05/18/15  3:42 PM  Result Value Ref Range   Vitamin B-12 335 211 - 946 pg/mL    PHQ2/9: Depression screen [facility] 2/9 08/14/2015 05/13/2015  Decreased Interest 0 2  Down, Depressed, Hopeless 0 1  PHQ - 2 Score 0 3  Altered sleeping - 1  Tired, decreased energy - 3  Change in appetite - 2  Feeling bad or failure about yourself  - 0  Trouble concentrating - 0  Moving slowly or fidgety/restless - 1  Suicidal thoughts - 0  PHQ-9 Score - 10  Difficult doing work/chores - Somewhat difficult   Recent bad news, but situational, does not want medication    Fall Risk: Fall Risk  08/14/2015 05/13/2015  Falls in the past year? No No  Risk for fall due to : - Impaired vision      Assessment & Plan  1. Well woman exam  2. Migraine without aura and without status migrainosus, not intractable  - rizatriptan (MAXALT) 10 MG tablet; Take 1 tablet (10 mg total) by mouth 2 (two) times daily as needed.  Dispense: 10 tablet; Refill: 2  3. Insomnia, persistent  - ALPRAZolam (XANAX) 0.5 MG tablet; Take 1 tablet (0.5 mg total) by mouth at bedtime as needed. For sleep  Dispense: 30  tablet; Refill: 0  4. B12 deficiency B12 shot today  5. Chronic constipation Resume medication, if abdominal pain does not resolve return sooner for follow up - Linaclotide (LINZESS) 145 MCG CAPS capsule; Take 1 capsule (145 mcg total) by mouth daily.  Dispense: 30 capsule; Refill: 2  6. Encounter for surveillance of injectable contraceptive -Depo today - Vit D  25 hydroxy (rtn osteoporosis monitoring)  7. Yeast vaginitis  - fluconazole (DIFLUCAN) 150 MG tablet; Take 1 tablet (150 mg total) by mouth once a week.  Dispense: 4 tablet; Refill: 2  8. Cervical cancer screening Normal pap in 07/2014 repeat in 3 years  42. Breast cancer screening  - MM Digital Screening; Future  10. Long-term use of high-risk medication  - Comprehensive metabolic panel - CBC with Differential/Platelet  11. Lipid screening  - Lipid panel

## 2015-08-15 LAB — CBC WITH DIFFERENTIAL/PLATELET
BASOS: 1 %
Basophils Absolute: 0.1 10*3/uL (ref 0.0–0.2)
EOS (ABSOLUTE): 0.1 10*3/uL (ref 0.0–0.4)
Eos: 2 %
HEMATOCRIT: 35.2 % (ref 34.0–46.6)
Hemoglobin: 11.7 g/dL (ref 11.1–15.9)
Immature Grans (Abs): 0 10*3/uL (ref 0.0–0.1)
Immature Granulocytes: 0 %
LYMPHS ABS: 2.2 10*3/uL (ref 0.7–3.1)
Lymphs: 37 %
MCH: 31.4 pg (ref 26.6–33.0)
MCHC: 33.2 g/dL (ref 31.5–35.7)
MCV: 94 fL (ref 79–97)
MONOS ABS: 0.4 10*3/uL (ref 0.1–0.9)
Monocytes: 7 %
Neutrophils Absolute: 3.3 10*3/uL (ref 1.4–7.0)
Neutrophils: 53 %
Platelets: 227 10*3/uL (ref 150–379)
RBC: 3.73 x10E6/uL — ABNORMAL LOW (ref 3.77–5.28)
RDW: 13.9 % (ref 12.3–15.4)
WBC: 6.1 10*3/uL (ref 3.4–10.8)

## 2015-08-15 LAB — COMPREHENSIVE METABOLIC PANEL
ALBUMIN: 4.9 g/dL (ref 3.5–5.5)
ALK PHOS: 53 IU/L (ref 39–117)
ALT: 11 IU/L (ref 0–32)
AST: 17 IU/L (ref 0–40)
Albumin/Globulin Ratio: 2 (ref 1.1–2.5)
BUN / CREAT RATIO: 18 (ref 9–23)
BUN: 12 mg/dL (ref 6–24)
Bilirubin Total: 1.3 mg/dL — ABNORMAL HIGH (ref 0.0–1.2)
CALCIUM: 9.5 mg/dL (ref 8.7–10.2)
CO2: 23 mmol/L (ref 18–29)
CREATININE: 0.67 mg/dL (ref 0.57–1.00)
Chloride: 100 mmol/L (ref 97–108)
GFR calc Af Amer: 124 mL/min/{1.73_m2} (ref >59)
GFR, EST NON AFRICAN AMERICAN: 107 mL/min/{1.73_m2} (ref >59)
Globulin, Total: 2.4 g/dL (ref 1.5–4.5)
Glucose: 97 mg/dL (ref 65–99)
Potassium: 4.2 mmol/L (ref 3.5–5.2)
SODIUM: 140 mmol/L (ref 134–144)
TOTAL PROTEIN: 7.3 g/dL (ref 6.0–8.5)

## 2015-08-15 LAB — LIPID PANEL
CHOLESTEROL TOTAL: 196 mg/dL (ref 100–199)
Chol/HDL Ratio: 3 ratio units (ref 0.0–4.4)
HDL: 65 mg/dL (ref >39)
LDL Calculated: 120 mg/dL — ABNORMAL HIGH (ref 0–99)
Triglycerides: 54 mg/dL (ref 0–149)
VLDL CHOLESTEROL CAL: 11 mg/dL (ref 5–40)

## 2015-08-15 LAB — VITAMIN D 25 HYDROXY (VIT D DEFICIENCY, FRACTURES): Vit D, 25-Hydroxy: 34.5 ng/mL (ref 30.0–100.0)

## 2015-08-18 NOTE — Progress Notes (Signed)
Patient notified

## 2015-09-14 ENCOUNTER — Ambulatory Visit (INDEPENDENT_AMBULATORY_CARE_PROVIDER_SITE_OTHER): Payer: Managed Care, Other (non HMO)

## 2015-09-14 DIAGNOSIS — E538 Deficiency of other specified B group vitamins: Secondary | ICD-10-CM

## 2015-09-14 MED ORDER — CYANOCOBALAMIN 1000 MCG/ML IJ SOLN
1000.0000 ug | Freq: Once | INTRAMUSCULAR | Status: AC
  Administered 2015-09-14: 1000 ug via INTRAMUSCULAR

## 2015-09-30 ENCOUNTER — Ambulatory Visit
Admission: RE | Admit: 2015-09-30 | Discharge: 2015-09-30 | Disposition: A | Discharge status: Home / Self Care | Payer: Managed Care, Other (non HMO) | Source: Ambulatory Visit | Attending: Family Medicine | Admitting: Family Medicine

## 2015-09-30 ENCOUNTER — Ambulatory Visit (INDEPENDENT_AMBULATORY_CARE_PROVIDER_SITE_OTHER): Payer: Managed Care, Other (non HMO) | Admitting: Family Medicine

## 2015-09-30 ENCOUNTER — Encounter: Payer: Self-pay | Admitting: Family Medicine

## 2015-09-30 VITALS — BP 114/62 | HR 66 | Temp 97.6°F | Resp 16 | Ht 66.0 in | Wt 122.9 lb

## 2015-09-30 DIAGNOSIS — M79662 Pain in left lower leg: Secondary | ICD-10-CM | POA: Diagnosis not present

## 2015-09-30 DIAGNOSIS — R252 Cramp and spasm: Secondary | ICD-10-CM | POA: Diagnosis not present

## 2015-09-30 NOTE — Progress Notes (Signed)
Name: Jessica Carroll   MRN: [id number]    DOB: 1971/09/30   Date:09/30/2015       Progress Note  Subjective  Chief Complaint  Chief Complaint  Patient presents with  . Muscle Pain    had a cramp in her left calf on monday, sore tuesday and hardly able to put pressure on it today. has took tylenol and used heat with no relief. Patient describes as tightness and swollen    HPI  Depo side effects: she started on Depo one month ago and has developed recurrent muscle cramps, worse at night, spotting since started on medication.   Left calf pain: pain started abruptly on Monday night, she woke up and was walking her to her room and felt a sharp pain and spasms/cramp on left calf, her mother heard a popping sound, cramp resolved and symptoms went away, however this morning she work up and left calf is tense, tender to touch, and also when walking, no ecchymosis, no redness, feels tight.   Patient Active Problem List   Diagnosis Date Noted  . Chronic constipation 08/14/2015  . Contraception management 08/14/2015  . Yeast vaginitis 08/14/2015  . Bilateral ganglion cysts of wrists 08/14/2015  . Mild mitral regurgitation 05/19/2015  . B12 deficiency 05/18/2015  . Back ache 05/18/2015  . Asthma, moderate 05/18/2015  . Anxiety 05/18/2015  . Insomnia, persistent 05/18/2015  . Eczema of hand 05/18/2015  . Viral keratitis 05/18/2015  . Migraine without aura and without status migrainosus, not intractable 05/18/2015  . Numerous moles 05/18/2015  . Allergic rhinitis 05/18/2015  . History of corneal transplant 05/18/2015  . Gastro-esophageal reflux disease without esophagitis 12/16/2009    Past Surgical History  Procedure Laterality Date  . Upper gi endoscopy  07/19/2012  . Corneal transplant Right 01/07/2013  . Eyelid surgery Right June 19, 2015    [facility] by Dr. Norlene Duel    Family History  Problem Relation Age of Onset  . Adopted: Yes  . Hyperlipidemia Mother   . Hypertension  Mother   . Diabetes Father   . Heart failure Father 84    hear attack    Social History   Social History  . Marital Status: Single    Spouse Name: N/A  . Number of Children: N/A  . Years of Education: 12   Occupational History  . textile    Social History Main Topics  . Smoking status: Never Smoker   . Smokeless tobacco: Never Used  . Alcohol Use: No  . Drug Use: No  . Sexual Activity:    Partners: Male    Birth Control/ Protection: None   Other Topics Concern  . Not on file   Social History Narrative     Current outpatient prescriptions:  .  albuterol (PROVENTIL HFA;VENTOLIN HFA) 108 (90 BASE) MCG/ACT inhaler, Inhale 2 puffs into the lungs as needed., Disp: , Rfl:  .  ALPRAZolam (XANAX) 0.5 MG tablet, Take 1 tablet (0.5 mg total) by mouth at bedtime as needed. For sleep, Disp: 30 tablet, Rfl: 0 .  Cyanocobalamin 2500 MCG SUBL, Place 1 tablet under the tongue daily., Disp: , Rfl:  .  cyclobenzaprine (FLEXERIL) 10 MG tablet, Take 1 tablet by mouth as needed., Disp: , Rfl:  .  diclofenac sodium (VOLTAREN) 1 % GEL, Place 1 Bottle onto the skin as needed. Apply to back for pain, Disp: , Rfl:  .  fluconazole (DIFLUCAN) 150 MG tablet, Take 1 tablet (150 mg total) by mouth  once a week., Disp: 4 tablet, Rfl: 2 .  fluticasone (FLONASE) 50 MCG/ACT nasal spray, Place 2 sprays into the nose as needed., Disp: , Rfl:  .  Fluticasone-Salmeterol (ADVAIR) 250-50 MCG/DOSE AEPB, Inhale 2 puffs into the lungs 2 (two) times daily., Disp: , Rfl:  .  ibuprofen (ADVIL,MOTRIN) 800 MG tablet, Take 1 tablet by mouth 3 (three) times daily as needed., Disp: , Rfl:  .  medroxyPROGESTERone (DEPO-PROVERA) 150 MG/ML injection, Inject 1 mL (150 mg total) into the muscle once., Disp: 1 mL, Rfl: 12 .  MULTIPLE VITAMIN PO, Take 1 tablet by mouth daily., Disp: , Rfl:  .  PROCTOFOAM HC rectal foam, , Disp: , Rfl: 0 .  promethazine (PHENERGAN) 25 MG tablet, Take 1 tablet (25 mg total) by mouth 2 (two) times  daily as needed for nausea., Disp: 30 tablet, Rfl: 1 .  ranitidine (ZANTAC) 300 MG capsule, Take 1 capsule (300 mg total) by mouth every evening., Disp: 30 capsule, Rfl: 0 .  rizatriptan (MAXALT) 10 MG tablet, Take 1 tablet (10 mg total) by mouth 2 (two) times daily as needed., Disp: 10 tablet, Rfl: 2 .  triamcinolone cream (KENALOG) 0.1 %, 1 application as needed., Disp: , Rfl:  .  valACYclovir (VALTREX) 1000 MG tablet, Take 1 tablet by mouth 2 (two) times daily., Disp: , Rfl:   Allergies  Allergen Reactions  . Penicillins Nausea Only     ROS  Constitutional: Negative for fever or weight change.  Respiratory: Negative for cough and shortness of breath.   Cardiovascular: Negative for chest pain or palpitations.  Gastrointestinal: Negative for abdominal pain, no bowel changes.  Musculoskeletal: Positive for gait problem no  joint swelling.  Skin: Negative for rash.  Neurological: Negative for dizziness or headache.  No other specific complaints in a complete review of systems (except as listed in HPI above).  Objective  Filed Vitals:   09/30/15 1153  BP: 114/62  Pulse: 66  Temp: 97.6 F (36.4 C)  TempSrc: Oral  Resp: 16  Height: 5\' 6"  (1.676 m)  Weight: 122 lb 14.4 oz (55.747 kg)  SpO2: 97%    Body mass index is 19.85 kg/(m^2).  Physical Exam  Constitutional: Patient appears well-developed and well-nourished. No distress.  HEENT: head atraumatic, normocephalic, pupils equal and reactive to light,  neck supple, throat within normal limits Cardiovascular: Normal rate, regular rhythm and normal heart sounds.  No murmur heard. No BLE edema. Pulmonary/Chest: Effort normal and breath sounds normal. No respiratory distress. Abdominal: Soft.  There is no tenderness. Psychiatric: Patient has a normal mood and affect. behavior is normal. Judgment and thought content normal. Muscular Skeletal: left calf is tender to touch and dorsiflexion of left foot, no redness , ecchymosis or  increase in warmth.   Recent Results (from the past 2160 hour(s))  Lipid panel     Status: Abnormal   Collection Time: 08/14/15  9:43 AM  Result Value Ref Range   Cholesterol, Total 196 100 - 199 mg/dL   Triglycerides 54 0 - 149 mg/dL   HDL 65 >39 mg/dL    Comment: According to ATP-III Guidelines, HDL-C >59 mg/dL is considered a negative risk factor for CHD.    VLDL Cholesterol Cal 11 5 - 40 mg/dL   LDL Calculated 120 (H) 0 - 99 mg/dL   Chol/HDL Ratio 3.0 0.0 - 4.4 ratio units    Comment:  T. Chol/HDL Ratio                                             Men  Women                               1/2 Avg.Risk  3.4    3.3                                   Avg.Risk  5.0    4.4                                2X Avg.Risk  9.6    7.1                                3X Avg.Risk 23.4   11.0   Comprehensive metabolic panel     Status: Abnormal   Collection Time: 08/14/15  9:43 AM  Result Value Ref Range   Glucose 97 65 - 99 mg/dL   BUN 12 6 - 24 mg/dL   Creatinine, Ser 0.67 0.57 - 1.00 mg/dL   GFR calc non Af Amer 107 >59 mL/min/1.73   GFR calc Af Amer 124 >59 mL/min/1.73   BUN/Creatinine Ratio 18 9 - 23   Sodium 140 134 - 144 mmol/L   Potassium 4.2 3.5 - 5.2 mmol/L   Chloride 100 97 - 108 mmol/L   CO2 23 18 - 29 mmol/L   Calcium 9.5 8.7 - 10.2 mg/dL   Total Protein 7.3 6.0 - 8.5 g/dL   Albumin 4.9 3.5 - 5.5 g/dL   Globulin, Total 2.4 1.5 - 4.5 g/dL   Albumin/Globulin Ratio 2.0 1.1 - 2.5   Bilirubin Total 1.3 (H) 0.0 - 1.2 mg/dL   Alkaline Phosphatase 53 39 - 117 IU/L   AST 17 0 - 40 IU/L   ALT 11 0 - 32 IU/L  CBC with Differential/Platelet     Status: Abnormal   Collection Time: 08/14/15  9:43 AM  Result Value Ref Range   WBC 6.1 3.4 - 10.8 x10E3/uL   RBC 3.73 (L) 3.77 - 5.28 x10E6/uL   Hemoglobin 11.7 11.1 - 15.9 g/dL   Hematocrit 35.2 34.0 - 46.6 %   MCV 94 79 - 97 fL   MCH 31.4 26.6 - 33.0 pg   MCHC 33.2 31.5 - 35.7 g/dL   RDW 13.9 12.3 -  15.4 %   Platelets 227 150 - 379 x10E3/uL   Neutrophils 53 %   Lymphs 37 %   Monocytes 7 %   Eos 2 %   Basos 1 %   Neutrophils Absolute 3.3 1.4 - 7.0 x10E3/uL   Lymphocytes Absolute 2.2 0.7 - 3.1 x10E3/uL   Monocytes Absolute 0.4 0.1 - 0.9 x10E3/uL   EOS (ABSOLUTE) 0.1 0.0 - 0.4 x10E3/uL   Basophils Absolute 0.1 0.0 - 0.2 x10E3/uL   Immature Granulocytes 0 %   Immature Grans (Abs) 0.0 0.0 - 0.1 x10E3/uL  Vit D  25 hydroxy (rtn osteoporosis monitoring)     Status: None   Collection Time: 08/14/15  9:43 AM  Result Value Ref  Range   Vit D, 25-Hydroxy 34.5 30.0 - 100.0 ng/mL    Comment: Vitamin D deficiency has been defined by the [place] practice guideline as a level of serum 25-OH vitamin D less than 20 ng/mL (1,2). The Endocrine Society went on to further define vitamin D insufficiency as a level between 21 and 29 ng/mL (2). 1. IOM (Institute of Medicine). 2010. Dietary reference    intakes for calcium and D. [place]: The    [employer]. 2. Holick MF, Binkley [place], Bischoff-Ferrari HA, et al.    Evaluation, treatment, and prevention of vitamin D    deficiency: an Endocrine Society clinical practice    guideline. JCEM. 2011 Jul; 96(7):1911-30.     PHQ2/9: Depression screen [facility] 2/9 08/14/2015 05/13/2015  Decreased Interest 0 2  Down, Depressed, Hopeless 0 1  PHQ - 2 Score 0 3  Altered sleeping - 1  Tired, decreased energy - 3  Change in appetite - 2  Feeling bad or failure about yourself  - 0  Trouble concentrating - 0  Moving slowly or fidgety/restless - 1  Suicidal thoughts - 0  PHQ-9 Score - 10  Difficult doing work/chores - Somewhat difficult     Fall Risk: Fall Risk  08/14/2015 05/13/2015  Falls in the past year? No No  Risk for fall due to : - Impaired vision    Assessment & Plan  1. Muscle cramp  We will stop Depo, next shot is due in December, but we will change medication   2. Calf pain, left  I was  worried about a muscle rupture, however pain started only two days later, we will rule out DVT ( although unlikely - but Depo can increase risk ) and if negative, use warm compresses, stretch and massage.  - US Venous Img Lower Unilateral Left; Future

## 2015-10-01 ENCOUNTER — Telehealth: Payer: Self-pay

## 2015-10-01 DIAGNOSIS — M79662 Pain in left lower leg: Secondary | ICD-10-CM

## 2015-10-01 NOTE — Telephone Encounter (Signed)
Patient states leg pain unchanged? Normal u/s

## 2015-10-01 NOTE — Telephone Encounter (Signed)
Sending referral to Ortho

## 2015-10-02 ENCOUNTER — Telehealth: Payer: Self-pay

## 2015-10-12 NOTE — Telephone Encounter (Signed)
error 

## 2015-10-13 ENCOUNTER — Ambulatory Visit (INDEPENDENT_AMBULATORY_CARE_PROVIDER_SITE_OTHER): Payer: Managed Care, Other (non HMO) | Admitting: Family Medicine

## 2015-10-13 ENCOUNTER — Encounter: Payer: Self-pay | Admitting: Family Medicine

## 2015-10-13 VITALS — BP 110/70 | HR 68 | Temp 98.8°F | Resp 16 | Ht 66.0 in | Wt 123.4 lb

## 2015-10-13 DIAGNOSIS — Z30011 Encounter for initial prescription of contraceptive pills: Secondary | ICD-10-CM | POA: Diagnosis not present

## 2015-10-13 DIAGNOSIS — B3731 Acute candidiasis of vulva and vagina: Secondary | ICD-10-CM

## 2015-10-13 DIAGNOSIS — B373 Candidiasis of vulva and vagina: Secondary | ICD-10-CM | POA: Diagnosis not present

## 2015-10-13 MED ORDER — FLUCONAZOLE 150 MG PO TABS
150.0000 mg | ORAL_TABLET | ORAL | Status: DC
Start: 2015-10-13 — End: 2016-01-20

## 2015-10-13 MED ORDER — NORGESTIMATE-ETH ESTRADIOL 0.25-35 MG-MCG PO TABS: 1.0000 | ORAL_TABLET | Freq: Every day | ORAL | Status: DC

## 2015-10-13 NOTE — Progress Notes (Signed)
Name: Jessica Carroll   MRN: [id number]    DOB: January 23, 1971   Date:10/13/2015       Progress Note  Subjective  Chief Complaint  Chief Complaint  Patient presents with  . Contraception    Patient is here, wants to stop depo due to abnormal bleeding. Would like to try the birth control pills.    HPI  Contraception: she used to take ocp's, but was forgetting to take it, therefore in September we changed to Depo, but she is not happy with side effects of spotting and also leg cramps. Boyfriend does not want to have a vasectomy.   Vaginitis: recurrent yeast episodes, taking one diflucan about every 5-6 days to control symptoms and needs a refill. Usually has itching and burning sensation, but not at this time. No vaginal discharge  Patient Active Problem List   Diagnosis Date Noted  . Chronic constipation 08/14/2015  . Contraception management 08/14/2015  . Yeast vaginitis 08/14/2015  . Bilateral ganglion cysts of wrists 08/14/2015  . Mild mitral regurgitation 05/19/2015  . B12 deficiency 05/18/2015  . Back ache 05/18/2015  . Asthma, moderate 05/18/2015  . Anxiety 05/18/2015  . Insomnia, persistent 05/18/2015  . Eczema of hand 05/18/2015  . Viral keratitis 05/18/2015  . Migraine without aura and without status migrainosus, not intractable 05/18/2015  . Numerous moles 05/18/2015  . Allergic rhinitis 05/18/2015  . History of corneal transplant 05/18/2015  . Gastro-esophageal reflux disease without esophagitis 12/16/2009    Past Surgical History  Procedure Laterality Date  . Upper gi endoscopy  07/19/2012  . Corneal transplant Right 01/07/2013  . Eyelid surgery Right June 19, 2015    [facility] by Dr. Norlene Duel    Family History  Problem Relation Age of Onset  . Adopted: Yes  . Hyperlipidemia Mother   . Hypertension Mother   . Diabetes Father   . Heart failure Father 73    hear attack    Social History   Social History  . Marital Status: Single    Spouse Name: N/A   . Number of Children: N/A  . Years of Education: 12   Occupational History  . textile    Social History Main Topics  . Smoking status: Never Smoker   . Smokeless tobacco: Never Used  . Alcohol Use: No  . Drug Use: No  . Sexual Activity:    Partners: Male    Birth Control/ Protection: None   Other Topics Concern  . Not on file   Social History Narrative     Current outpatient prescriptions:  .  prednisoLONE acetate (PRED FORTE) 1 % ophthalmic suspension, Administer 1 drop to the right eye daily., Disp: , Rfl:  .  albuterol (PROVENTIL HFA;VENTOLIN HFA) 108 (90 BASE) MCG/ACT inhaler, Inhale 2 puffs into the lungs as needed., Disp: , Rfl:  .  ALPRAZolam (XANAX) 0.5 MG tablet, Take 1 tablet (0.5 mg total) by mouth at bedtime as needed. For sleep, Disp: 30 tablet, Rfl: 0 .  Cyanocobalamin 2500 MCG SUBL, Place 1 tablet under the tongue daily., Disp: , Rfl:  .  cyclobenzaprine (FLEXERIL) 10 MG tablet, Take 1 tablet by mouth as needed., Disp: , Rfl:  .  DEXILANT 60 MG capsule, Take 1 tablet by mouth daily., Disp: , Rfl: 0 .  fluconazole (DIFLUCAN) 150 MG tablet, Take 1 tablet (150 mg total) by mouth once a week. And prn, Disp: 5 tablet, Rfl: 2 .  fluticasone (FLONASE) 50 MCG/ACT nasal spray, Place 2 sprays  into the nose as needed., Disp: , Rfl:  .  Fluticasone-Salmeterol (ADVAIR) 250-50 MCG/DOSE AEPB, Inhale 2 puffs into the lungs 2 (two) times daily., Disp: , Rfl:  .  ibuprofen (ADVIL,MOTRIN) 800 MG tablet, Take 1 tablet by mouth 3 (three) times daily as needed., Disp: , Rfl:  .  MULTIPLE VITAMIN PO, Take 1 tablet by mouth daily., Disp: , Rfl:  .  norgestimate-ethinyl estradiol (ORTHO-CYCLEN, 28,) 0.25-35 MG-MCG tablet, Take 1 tablet by mouth daily., Disp: 1 Package, Rfl: 11 .  PROCTOFOAM HC rectal foam, , Disp: , Rfl: 0 .  promethazine (PHENERGAN) 25 MG tablet, Take 1 tablet (25 mg total) by mouth 2 (two) times daily as needed for nausea., Disp: 30 tablet, Rfl: 1 .  ranitidine  (ZANTAC) 300 MG capsule, Take 1 capsule (300 mg total) by mouth every evening., Disp: 30 capsule, Rfl: 0 .  rizatriptan (MAXALT) 10 MG tablet, Take 1 tablet (10 mg total) by mouth 2 (two) times daily as needed., Disp: 10 tablet, Rfl: 2 .  triamcinolone cream (KENALOG) 0.1 %, 1 application as needed., Disp: , Rfl:  .  valACYclovir (VALTREX) 1000 MG tablet, Take 1 tablet by mouth 2 (two) times daily., Disp: , Rfl:   Allergies  Allergen Reactions  . Penicillins Nausea Only     ROS  Ten systems reviewed and is negative except as mentioned in HPI   Objective  Filed Vitals:   10/13/15 1600  BP: 110/70  Pulse: 68  Temp: 98.8 F (37.1 C)  TempSrc: Oral  Resp: 16  Height: 5\' 6"  (1.676 m)  Weight: 123 lb 6.4 oz (55.974 kg)  SpO2: 96%    Body mass index is 19.93 kg/(m^2).  Physical Exam  Constitutional: Patient appears well-developed and well-nourished.  No distress.  HEENT: head atraumatic, normocephalic, pupils equal and reactive to light,neck supple, throat within normal limits Cardiovascular: Normal rate, regular rhythm and normal heart sounds.  No murmur heard. No BLE edema. Pulmonary/Chest: Effort normal and breath sounds normal. No respiratory distress. Abdominal: Soft.  There is no tenderness. Psychiatric: Patient has a normal mood and affect. behavior is normal. Judgment and thought content normal.  Recent Results (from the past 2160 hour(s))  Lipid panel     Status: Abnormal   Collection Time: 08/14/15  9:43 AM  Result Value Ref Range   Cholesterol, Total 196 100 - 199 mg/dL   Triglycerides 54 0 - 149 mg/dL   HDL 65 >39 mg/dL    Comment: According to ATP-III Guidelines, HDL-C >59 mg/dL is considered a negative risk factor for CHD.    VLDL Cholesterol Cal 11 5 - 40 mg/dL   LDL Calculated 120 (H) 0 - 99 mg/dL   Chol/HDL Ratio 3.0 0.0 - 4.4 ratio units    Comment:                                   T. Chol/HDL Ratio                                             Men   Women                               1/2 Avg.Risk  3.4    3.3  Avg.Risk  5.0    4.4                                2X Avg.Risk  9.6    7.1                                3X Avg.Risk 23.4   11.0   Comprehensive metabolic panel     Status: Abnormal   Collection Time: 08/14/15  9:43 AM  Result Value Ref Range   Glucose 97 65 - 99 mg/dL   BUN 12 6 - 24 mg/dL   Creatinine, Ser 0.67 0.57 - 1.00 mg/dL   GFR calc non Af Amer 107 >59 mL/min/1.73   GFR calc Af Amer 124 >59 mL/min/1.73   BUN/Creatinine Ratio 18 9 - 23   Sodium 140 134 - 144 mmol/L   Potassium 4.2 3.5 - 5.2 mmol/L   Chloride 100 97 - 108 mmol/L   CO2 23 18 - 29 mmol/L   Calcium 9.5 8.7 - 10.2 mg/dL   Total Protein 7.3 6.0 - 8.5 g/dL   Albumin 4.9 3.5 - 5.5 g/dL   Globulin, Total 2.4 1.5 - 4.5 g/dL   Albumin/Globulin Ratio 2.0 1.1 - 2.5   Bilirubin Total 1.3 (H) 0.0 - 1.2 mg/dL   Alkaline Phosphatase 53 39 - 117 IU/L   AST 17 0 - 40 IU/L   ALT 11 0 - 32 IU/L  CBC with Differential/Platelet     Status: Abnormal   Collection Time: 08/14/15  9:43 AM  Result Value Ref Range   WBC 6.1 3.4 - 10.8 x10E3/uL   RBC 3.73 (L) 3.77 - 5.28 x10E6/uL   Hemoglobin 11.7 11.1 - 15.9 g/dL   Hematocrit 35.2 34.0 - 46.6 %   MCV 94 79 - 97 fL   MCH 31.4 26.6 - 33.0 pg   MCHC 33.2 31.5 - 35.7 g/dL   RDW 13.9 12.3 - 15.4 %   Platelets 227 150 - 379 x10E3/uL   Neutrophils 53 %   Lymphs 37 %   Monocytes 7 %   Eos 2 %   Basos 1 %   Neutrophils Absolute 3.3 1.4 - 7.0 x10E3/uL   Lymphocytes Absolute 2.2 0.7 - 3.1 x10E3/uL   Monocytes Absolute 0.4 0.1 - 0.9 x10E3/uL   EOS (ABSOLUTE) 0.1 0.0 - 0.4 x10E3/uL   Basophils Absolute 0.1 0.0 - 0.2 x10E3/uL   Immature Granulocytes 0 %   Immature Grans (Abs) 0.0 0.0 - 0.1 x10E3/uL  Vit D  25 hydroxy (rtn osteoporosis monitoring)     Status: None   Collection Time: 08/14/15  9:43 AM  Result Value Ref Range   Vit D, 25-Hydroxy 34.5 30.0 - 100.0 ng/mL    Comment:  Vitamin D deficiency has been defined by the Institute of Medicine and an Endocrine Society practice guideline as a level of serum 25-OH vitamin D less than 20 ng/mL (1,2). The Endocrine Society went on to further define vitamin D insufficiency as a level between 21 and 29 ng/mL (2). 1. IOM (Institute of Medicine). 2010. Dietary reference    intakes for calcium and D. [place]: The    [employer]. 2. Holick MF, Binkley [place], Bischoff-Ferrari HA, et al.    Evaluation, treatment, and prevention of vitamin D    deficiency: an Endocrine Society clinical practice    guideline.  JCEM. 2011 Jul; 96(7):1911-30.    PHQ2/9: Depression screen [facility] 2/9 08/14/2015 05/13/2015  Decreased Interest 0 2  Down, Depressed, Hopeless 0 1  PHQ - 2 Score 0 3  Altered sleeping - 1  Tired, decreased energy - 3  Change in appetite - 2  Feeling bad or failure about yourself  - 0  Trouble concentrating - 0  Moving slowly or fidgety/restless - 1  Suicidal thoughts - 0  PHQ-9 Score - 10  Difficult doing work/chores - Somewhat difficult    Fall Risk: Fall Risk  08/14/2015 05/13/2015  Falls in the past year? No No  Risk for fall due to : - Impaired vision    Assessment & Plan  1. Encounter for initial prescription of contraceptive pills  - norgestimate-ethinyl estradiol (ORTHO-CYCLEN, 28,) 0.25-35 MG-MCG tablet; Take 1 tablet by mouth daily.  Dispense: 1 Package; Refill: 11  2. Yeast vaginitis  - fluconazole (DIFLUCAN) 150 MG tablet; Take 1 tablet (150 mg total) by mouth once a week. And prn  Dispense: 5 tablet; Refill: 2

## 2015-11-11 ENCOUNTER — Ambulatory Visit: Payer: Managed Care, Other (non HMO) | Admitting: Obstetrics and Gynecology

## 2015-11-13 ENCOUNTER — Ambulatory Visit: Payer: Managed Care, Other (non HMO) | Admitting: Family Medicine

## 2015-12-17 ENCOUNTER — Ambulatory Visit (INDEPENDENT_AMBULATORY_CARE_PROVIDER_SITE_OTHER): Payer: Managed Care, Other (non HMO) | Admitting: Family Medicine

## 2015-12-17 ENCOUNTER — Encounter: Payer: Self-pay | Admitting: Family Medicine

## 2015-12-17 VITALS — BP 108/66 | HR 82 | Temp 98.1°F | Resp 16 | Ht 66.0 in | Wt 123.4 lb

## 2015-12-17 DIAGNOSIS — J45909 Unspecified asthma, uncomplicated: Secondary | ICD-10-CM

## 2015-12-17 DIAGNOSIS — K5900 Constipation, unspecified: Secondary | ICD-10-CM | POA: Diagnosis not present

## 2015-12-17 DIAGNOSIS — Z3009 Encounter for other general counseling and advice on contraception: Secondary | ICD-10-CM

## 2015-12-17 DIAGNOSIS — K5909 Other constipation: Secondary | ICD-10-CM

## 2015-12-17 DIAGNOSIS — Z113 Encounter for screening for infections with a predominantly sexual mode of transmission: Secondary | ICD-10-CM | POA: Diagnosis not present

## 2015-12-17 DIAGNOSIS — D519 Vitamin B12 deficiency anemia, unspecified: Secondary | ICD-10-CM

## 2015-12-17 DIAGNOSIS — G4700 Insomnia, unspecified: Secondary | ICD-10-CM

## 2015-12-17 DIAGNOSIS — K219 Gastro-esophageal reflux disease without esophagitis: Secondary | ICD-10-CM

## 2015-12-17 DIAGNOSIS — R110 Nausea: Secondary | ICD-10-CM

## 2015-12-17 DIAGNOSIS — G43009 Migraine without aura, not intractable, without status migrainosus: Secondary | ICD-10-CM

## 2015-12-17 DIAGNOSIS — J45998 Other asthma: Secondary | ICD-10-CM

## 2015-12-17 DIAGNOSIS — F419 Anxiety disorder, unspecified: Secondary | ICD-10-CM

## 2015-12-17 MED ORDER — ZOLPIDEM TARTRATE 5 MG PO TABS: 5.0000 mg | ORAL_TABLET | Freq: Every evening | ORAL | Status: DC | PRN

## 2015-12-17 MED ORDER — FLUTICASONE-SALMETEROL 250-50 MCG/DOSE IN AEPB: 2.0000 | INHALATION_SPRAY | Freq: Two times a day (BID) | RESPIRATORY_TRACT | Status: DC

## 2015-12-17 MED ORDER — DEXILANT 60 MG PO CPDR: 60.0000 mg | DELAYED_RELEASE_CAPSULE | Freq: Every day | ORAL | Status: DC

## 2015-12-17 MED ORDER — PROMETHAZINE HCL 25 MG PO TABS
25.0000 mg | ORAL_TABLET | Freq: Two times a day (BID) | ORAL | Status: DC | PRN
Start: 2015-12-17 — End: 2016-02-24

## 2015-12-17 MED ORDER — ALPRAZOLAM 0.5 MG PO TABS: 0.5000 mg | ORAL_TABLET | Freq: Two times a day (BID) | ORAL | Status: DC | PRN

## 2015-12-17 MED ORDER — CYANOCOBALAMIN 1000 MCG/ML IJ SOLN
1000.0000 ug | Freq: Once | INTRAMUSCULAR | Status: AC
  Administered 2015-12-17: 1000 ug via INTRAMUSCULAR

## 2015-12-17 MED ORDER — CYCLOBENZAPRINE HCL 10 MG PO TABS: 10.0000 mg | ORAL_TABLET | ORAL | Status: DC | PRN

## 2015-12-17 NOTE — Progress Notes (Signed)
Name: Jessica Carroll   MRN: [id number]    DOB: [birth year]   Date:12/17/2015       Progress Note  Subjective  Chief Complaint  Chief Complaint  Patient presents with  . Medication Management    2 month F/U  . Gastroesophageal Reflux    Well controlled  . Allergic Rhinitis   . Asthma  . Insomnia    Unable to sleep due to stress, sleeping 3 hours nightly  . Stress    Broke up with boyfriend around Christmas for a week and would like to be checked for STD's due to him being with someone else while there were broken up for a week.     HPI  Constipation: she used to take Linzess but has been off medication, and stools are back to normal now, no straining, she has been eating more fiber  Migraine: she has episodes without aura, about once or twice monthly, Maxalt usually improves the symptoms within a few hours. She has photophobia and phonophobia, she also has nausea at times. No recent changes.   B12 deficiency: taking otc but level was still low and we will start injections  Insomnia: she was doing well on Alprazolam, but she has been stressed since Christmas, boyfriend of 12 years broke up with her and she has not been sleeping well, she moved back in with him four days ago, they will go to counseling together. She would like something stronger for sleep  Anxiety: getting worse since Christmas, can't sleep, feeling nauseated like a knot in her stomach, shaky at times, mind is always busy and worried. Also concerned about STI, since he had sex with someone else when they are apart.   Contraception: she would like to see GYN and have tubes tied, not interested on IUD, can't tolerate Depo and did like taking ocp because of nausea   Patient Active Problem List   Diagnosis Date Noted  . Chronic constipation 08/14/2015  . Contraception management 08/14/2015  . Yeast vaginitis 08/14/2015  . Bilateral ganglion cysts of wrists 08/14/2015  . Mild mitral regurgitation 05/19/2015  . B12  deficiency 05/18/2015  . Back ache 05/18/2015  . Asthma, moderate 05/18/2015  . Anxiety 05/18/2015  . Insomnia, persistent 05/18/2015  . Eczema of hand 05/18/2015  . Viral keratitis 05/18/2015  . Migraine without aura and without status migrainosus, not intractable 05/18/2015  . Numerous moles 05/18/2015  . Allergic rhinitis 05/18/2015  . History of corneal transplant 05/18/2015  . Gastro-esophageal reflux disease without esophagitis 12/16/2009    Past Surgical History  Procedure Laterality Date  . Upper gi endoscopy  07/19/2012  . Corneal transplant Right 01/07/2013  . Eyelid surgery Right June 19, 2015    [facility] by Dr. Norlene Duel    Family History  Problem Relation Age of Onset  . Adopted: Yes  . Hyperlipidemia Mother   . Hypertension Mother   . Diabetes Father   . Heart failure Father 34    hear attack    Social History   Social History  . Marital Status: Single    Spouse Name: N/A  . Number of Children: N/A  . Years of Education: 12   Occupational History  . textile    Social History Main Topics  . Smoking status: Never Smoker   . Smokeless tobacco: Never Used  . Alcohol Use: No  . Drug Use: No  . Sexual Activity:    Partners: Male    Patent examiner Protection: None  Other Topics Concern  . Not on file   Social History Narrative     Current outpatient prescriptions:  .  albuterol (PROVENTIL HFA;VENTOLIN HFA) 108 (90 BASE) MCG/ACT inhaler, Inhale 2 puffs into the lungs as needed., Disp: , Rfl:  .  ALPRAZolam (XANAX) 0.5 MG tablet, Take 1 tablet (0.5 mg total) by mouth at bedtime as needed. For sleep, Disp: 30 tablet, Rfl: 0 .  cyclobenzaprine (FLEXERIL) 10 MG tablet, Take 1 tablet by mouth as needed., Disp: , Rfl:  .  DEXILANT 60 MG capsule, Take 1 tablet by mouth daily., Disp: , Rfl: 0 .  fluconazole (DIFLUCAN) 150 MG tablet, Take 1 tablet (150 mg total) by mouth once a week. And prn, Disp: 5 tablet, Rfl: 2 .  fluticasone (FLONASE) 50 MCG/ACT  nasal spray, Place 2 sprays into the nose as needed., Disp: , Rfl:  .  Fluticasone-Salmeterol (ADVAIR) 250-50 MCG/DOSE AEPB, Inhale 2 puffs into the lungs 2 (two) times daily., Disp: , Rfl:  .  ibuprofen (ADVIL,MOTRIN) 800 MG tablet, Take 1 tablet by mouth 3 (three) times daily as needed., Disp: , Rfl:  .  MULTIPLE VITAMIN PO, Take 1 tablet by mouth daily., Disp: , Rfl:  .  prednisoLONE acetate (PRED FORTE) 1 % ophthalmic suspension, Administer 1 drop to the right eye daily., Disp: , Rfl:  .  PROCTOFOAM HC rectal foam, , Disp: , Rfl: 0 .  promethazine (PHENERGAN) 25 MG tablet, Take 1 tablet (25 mg total) by mouth 2 (two) times daily as needed for nausea., Disp: 30 tablet, Rfl: 1 .  ranitidine (ZANTAC) 300 MG capsule, Take 1 capsule (300 mg total) by mouth every evening., Disp: 30 capsule, Rfl: 0 .  rizatriptan (MAXALT) 10 MG tablet, Take 1 tablet (10 mg total) by mouth 2 (two) times daily as needed., Disp: 10 tablet, Rfl: 2 .  tacrolimus (PROTOPIC) 0.03 % ointment, Apply topically., Disp: , Rfl:  .  triamcinolone cream (KENALOG) 0.1 %, 1 application as needed., Disp: , Rfl:  .  valACYclovir (VALTREX) 500 MG tablet, Take 1 tablet by mouth 2 (two) times daily., Disp: , Rfl: 0  Allergies  Allergen Reactions  . Penicillins Nausea Only     ROS  Constitutional: Negative for fever or weight change.  Respiratory: Negative for cough and shortness of breath.   Cardiovascular: Negative for chest pain or palpitations.  Gastrointestinal: Negative for abdominal pain, no bowel changes.  Musculoskeletal: Negative for gait problem or joint swelling.  Skin: Negative for rash.  Neurological: Negative for dizziness or headache.  No other specific complaints in a complete review of systems (except as listed in HPI above).  Objective  Filed Vitals:   12/17/15 1545  BP: 108/66  Pulse: 82  Temp: 98.1 F (36.7 C)  TempSrc: Oral  Resp: 16  Height: 5\' 6"  (1.676 m)  Weight: 123 lb 6.4 oz (55.974 kg)   SpO2: 96%    Body mass index is 19.93 kg/(m^2).  Physical Exam  Constitutional: Patient appears well-developed and well-nourished.  No distress.  HEENT: head atraumatic, normocephalic, pupils equal and reactive to light,, neck supple, throat within normal limits Cardiovascular: Normal rate, regular rhythm and normal heart sounds.  No murmur heard. No BLE edema. Pulmonary/Chest: Effort normal and breath sounds normal. No respiratory distress. Abdominal: Soft.  There is no tenderness. Psychiatric: Patient has a normal mood and affect. behavior is normal. Judgment and thought content normal.   PHQ2/9: Depression screen [facility] 2/9 08/14/2015 05/13/2015  Decreased Interest 0 2  Down, Depressed, Hopeless 0 1  PHQ - 2 Score 0 3  Altered sleeping - 1  Tired, decreased energy - 3  Change in appetite - 2  Feeling bad or failure about yourself  - 0  Trouble concentrating - 0  Moving slowly or fidgety/restless - 1  Suicidal thoughts - 0  PHQ-9 Score - 10  Difficult doing work/chores - Somewhat difficult    Fall Risk: Fall Risk  12/17/2015 08/14/2015 05/13/2015  Falls in the past year? No No No  Risk for fall due to : - - Impaired vision    Functional Status Survey: Is the patient deaf or have difficulty hearing?: No Does the patient have difficulty seeing, even when wearing glasses/contacts?: Yes (glasses) Does the patient have difficulty concentrating, remembering, or making decisions?: No Does the patient have difficulty walking or climbing stairs?: No Does the patient have difficulty dressing or bathing?: No Does the patient have difficulty doing errands alone such as visiting a doctor's office or shopping?: No    Assessment & Plan  1. Insomnia, persistent  - zolpidem (AMBIEN) 5 MG tablet; Take 1 tablet (5 mg total) by mouth at bedtime as needed for sleep.  Dispense: 15 tablet; Refill: 1  2. B12 deficiency anemia  - cyanocobalamin ((VITAMIN B-12)) injection 1,000 mcg; Inject 1 mL  (1,000 mcg total) into the muscle once.  3. Chronic constipation  Continue prn medication   4. Migraine without aura and without status migrainosus, not intractable  - promethazine (PHENERGAN) 25 MG tablet; Take 1 tablet (25 mg total) by mouth 2 (two) times daily as needed for nausea.  Dispense: 30 tablet; Refill: 1  5. Anxiety  Can take prn alprazolam for anxiety   6. Asthma, moderate  Doing well at this time, using Advair twice daily and is under control   7. Gastro-esophageal reflux disease without esophagitis  - DEXILANT 60 MG capsule; Take 1 capsule (60 mg total) by mouth daily.  Dispense: 30 capsule; Refill: 2  8. Encounter for other general counseling or advice on contraception  - Ambulatory referral to Obstetrics / Gynecology  9. Nausea  - promethazine (PHENERGAN) 25 MG tablet; Take 1 tablet (25 mg total) by mouth 2 (two) times daily as needed for nausea.  Dispense: 30 tablet; Refill: 1  10. Routine screening for STI (sexually transmitted infection)  - HIV antibody - RPR - HSV(herpes smplx)abs-1+2(IgG+IgM)-bld - GC/chlamydia probe amp, urine

## 2015-12-18 ENCOUNTER — Other Ambulatory Visit: Payer: Self-pay | Admitting: Family Medicine

## 2015-12-18 ENCOUNTER — Telehealth: Payer: Self-pay

## 2015-12-18 LAB — RPR: RPR Ser Ql: NONREACTIVE

## 2015-12-18 LAB — HIV ANTIBODY (ROUTINE TESTING W REFLEX): HIV Screen 4th Generation wRfx: NONREACTIVE

## 2015-12-18 NOTE — Telephone Encounter (Signed)
Patient called wanting to know about her referral (to have her tubes tied) and was informed that her referral to [facility] has been placed and that she was welcome to call them to schedule an appt.

## 2015-12-19 LAB — HSV(HERPES SMPLX)ABS-I+II(IGG+IGM)-BLD
HSV 2 Glycoprotein G Ab, IgG: 18.1 index — ABNORMAL HIGH (ref 0.00–0.90)
HSVI/II Comb IgM: 1.07 Ratio — ABNORMAL HIGH (ref 0.00–0.90)

## 2015-12-21 LAB — PLEASE NOTE

## 2015-12-22 LAB — GC/CHLAMYDIA PROBE AMP
CHLAMYDIA, DNA PROBE: NEGATIVE
NEISSERIA GONORRHOEAE BY PCR: NEGATIVE

## 2015-12-22 LAB — SPECIMEN STATUS REPORT

## 2015-12-30 ENCOUNTER — Ambulatory Visit (INDEPENDENT_AMBULATORY_CARE_PROVIDER_SITE_OTHER): Payer: Managed Care, Other (non HMO) | Admitting: Obstetrics and Gynecology

## 2015-12-30 ENCOUNTER — Encounter: Payer: Self-pay | Admitting: Obstetrics and Gynecology

## 2015-12-30 VITALS — BP 115/57 | HR 69 | Ht 65.0 in | Wt 123.2 lb

## 2015-12-30 DIAGNOSIS — N941 Unspecified dyspareunia: Secondary | ICD-10-CM | POA: Diagnosis not present

## 2015-12-30 DIAGNOSIS — Z302 Encounter for sterilization: Secondary | ICD-10-CM

## 2015-12-30 DIAGNOSIS — N946 Dysmenorrhea, unspecified: Secondary | ICD-10-CM

## 2015-12-30 NOTE — Progress Notes (Signed)
GYN ENCOUNTER NOTE  Subjective:       Jessica Carroll is a 45 y.o. G0P0000 female is here for gynecologic evaluation of the following issues:  1. Consultation regarding permanent sterilization.     Gynecologic History Menarche-age 52. Cycles monthly. Duration of flow 4 days. History of dysmenorrhea, moderate, colon typically does not take medications; does report perimenstrual low backache without radiation into the buttocks or thighs. History of deep thrusting dyspareunia. No family history of ovarian cancer. No family history of endometriosis No history of abnormal Pap smears. No history of PID/STI Patient's last menstrual period was 12/09/2015. Contraception: Depo-Provera injections Last Pap: Normal.   Obstetric History OB History  Gravida Para Term Preterm AB SAB TAB Ectopic Multiple Living  0 0 0 0 0 0 0 0 0 0         Past Medical History  Diagnosis Date  . Diffuse cystic mastopathy   . Asthma 2011  . GERD (gastroesophageal reflux disease)     Past Surgical History  Procedure Laterality Date  . Upper gi endoscopy  07/19/2012  . Corneal transplant Right 01/07/2013  . Eyelid surgery Right June 19, 2015    [facility] by Dr. Norlene Duel    Current Outpatient Prescriptions on File Prior to Visit  Medication Sig Dispense Refill  . albuterol (PROVENTIL HFA;VENTOLIN HFA) 108 (90 BASE) MCG/ACT inhaler Inhale 2 puffs into the lungs as needed.    . ALPRAZolam (XANAX) 0.5 MG tablet Take 1 tablet (0.5 mg total) by mouth 2 (two) times daily as needed for anxiety. For sleep 30 tablet 0  . DEXILANT 60 MG capsule Take 1 capsule (60 mg total) by mouth daily. 30 capsule 2  . fluconazole (DIFLUCAN) 150 MG tablet Take 1 tablet (150 mg total) by mouth once a week. And prn 5 tablet 2  . fluticasone (FLONASE) 50 MCG/ACT nasal spray Place 2 sprays into the nose as needed.    . Fluticasone-Salmeterol (ADVAIR) 250-50 MCG/DOSE AEPB Inhale 2 puffs into the lungs 2 (two) times daily. 60 each  5  . ibuprofen (ADVIL,MOTRIN) 800 MG tablet Take 1 tablet by mouth 3 (three) times daily as needed.    . MULTIPLE VITAMIN PO Take 1 tablet by mouth daily.    . prednisoLONE acetate (PRED FORTE) 1 % ophthalmic suspension Administer 1 drop to the right eye daily.    Marland Kitchen PROCTOFOAM HC rectal foam   0  . ranitidine (ZANTAC) 300 MG capsule Take 1 capsule (300 mg total) by mouth every evening. 30 capsule 0  . rizatriptan (MAXALT) 10 MG tablet Take 1 tablet (10 mg total) by mouth 2 (two) times daily as needed. 10 tablet 2  . tacrolimus (PROTOPIC) 0.03 % ointment Apply topically.    . triamcinolone cream (KENALOG) 0.1 % 1 application as needed.    . valACYclovir (VALTREX) 500 MG tablet Take 1 tablet by mouth 2 (two) times daily.  0  . zolpidem (AMBIEN) 5 MG tablet Take 1 tablet (5 mg total) by mouth at bedtime as needed for sleep. 15 tablet 1  . cyclobenzaprine (FLEXERIL) 10 MG tablet Take 1 tablet (10 mg total) by mouth as needed. 30 tablet 0  . promethazine (PHENERGAN) 25 MG tablet Take 1 tablet (25 mg total) by mouth 2 (two) times daily as needed for nausea. 30 tablet 1   No current facility-administered medications on file prior to visit.    Allergies  Allergen Reactions  . Penicillins Nausea Only    Social History  Social History  . Marital Status: Single    Spouse Name: N/A  . Number of Children: N/A  . Years of Education: 12   Occupational History  . textile    Social History Main Topics  . Smoking status: Never Smoker   . Smokeless tobacco: Never Used  . Alcohol Use: No  . Drug Use: No  . Sexual Activity:    Partners: Male    Birth Control/ Protection: None   Other Topics Concern  . Not on file   Social History Narrative    Family History  Problem Relation Age of Onset  . Adopted: Yes  . Hyperlipidemia Mother   . Hypertension Mother   . Diabetes Father   . Heart failure Father 57    hear attack  . Cancer Neg Hx     The following portions of the patient's  history were reviewed and updated as appropriate: allergies, current medications, past family history, past medical history, past social history, past surgical history and problem list.  Review of Systems Review of Systems - General ROS: negative for - chills, fatigue, fever, hot flashes, malaise or night sweats Hematological and Lymphatic ROS: negative for - bleeding problems or swollen lymph nodes Gastrointestinal ROS: negative for - abdominal pain, blood in stools, change in bowel habits and nausea/vomiting Musculoskeletal ROS: negative for - joint pain, muscle pain or muscular weakness Genito-Urinary ROS: negative for - change in menstrual cycle, dysmenorrhea, dyspareunia, dysuria, genital discharge, genital ulcers, hematuria, incontinence, irregular/heavy menses, nocturia or pelvic painjj  Objective:   BP 115/57 mmHg  Pulse 69  Ht 5\' 5"  (1.651 m)  Wt 123 lb 3.2 oz (55.883 kg)  BMI 20.50 kg/m2  LMP 12/09/2015 Physical exam-deferred  Assessment:   1.  Patient desires sterilization.  Partner declines vasectomy. 2.  History of dysmenorrhea. 3.  History of deep thrusting dyspareunia   Plan:   1.  Pros and cons of permanent sterilization were reviewed.  Patient is understanding of the benefit of bilateral salpingectomy to reduce risk of ovarian cancer, Specifically Epithelial ovarian tumors. 2.  Patient is scheduled for laparoscopic bilateral salpingectomy on 01/18/2016. 3.  Patient will return for preop appointment on 01/14/2016 4.  Patient understands that at the time of laparoscopy.  We will look for evidence of endometriosis, which could be contributing to her dysmenorrhea and deep thrusting dyspareunia.  Jessica Slim [name], MD  A total of 20 minutes were spent face-to-face with the patient during the encounter with greater than 50% dealing with counseling and coordination of care.  Note: This dictation was prepared with Dragon dictation along with smaller phrase technology. Any  transcriptional errors that result from this process are unintentional.

## 2015-12-30 NOTE — Patient Instructions (Signed)
Laparoscopic Tubal Ligation, Care After Refer to this sheet in the next few weeks. These instructions provide you with information about caring for yourself after your procedure. Your health care provider may also give you more specific instructions. Your treatment has been planned according to current medical practices, but problems sometimes occur. Call your health care provider if you have any problems or questions after your procedure. WHAT TO EXPECT AFTER THE PROCEDURE After your procedure, it is common to have:  Sore throat.  Soreness at the incision site.  Mild cramping.  Tiredness.  Mild nausea or vomiting.  Shoulder pain. HOME CARE INSTRUCTIONS  Rest for the remainder of the day.  Take medicines only as directed by your health care provider. These include over-the-counter medicines and prescription medicines. Do not take aspirin, which can cause bleeding.  Over the next few days, gradually return to your normal activities and your normal diet.  Avoid sexual intercourse for 2 weeks or as directed by your health care provider.  Do not use tampons, and do not douche.  Do not drive or operate heavy machinery while taking pain medicine.  Do not lift anything that is heavier than 5 lb (2.3 kg) for 2 weeks or as directed by your health care provider.  Do not take baths. Take showers only. Ask your health care provider when you can start taking baths.  Take your temperature twice each day and write it down.  Try to have help for your household needs for the first 7-10 days.  There are many different ways to close and cover an incision, including stitches (sutures), skin glue, and adhesive strips. Follow instructions from your health care provider about:  Incision care.  Bandage (dressing) changes and removal.  Incision closure removal.  Check your incision area every day for signs of infection. Watch for:  Redness, swelling, or pain.  Fluid, blood, or pus.  Keep  all follow-up visits as directed by your health care provider. SEEK MEDICAL CARE IF:  You have redness, swelling, or increasing pain in your incision area.  You have fluid, blood, or pus coming from your incision for longer than 1 day.  You notice a bad smell coming from your incision or your dressing.  The edges of your incision break open after the sutures have been removed.  Your pain does not decrease after 2-3 days.  You have a rash.  You repeatedly become dizzy or light-headed.  You have a reaction to your medicine.  Your pain medicine is not helping.  You are constipated. SEEK IMMEDIATE MEDICAL CARE IF:  You have a fever.  You faint.  You have increasing pain in your abdomen.  You have severe pain in one or both of your shoulders.  You have bleeding or drainage from your suture sites or your vagina after surgery.  You have shortness of breath or have difficulty breathing.  You have chest pain or leg pain.  You have ongoing nausea, vomiting, or diarrhea.   This information is not intended to replace advice given to you by your health care provider. Make sure you discuss any questions you have with your health care provider.   Document Released: 06/17/2005 Document Revised: 04/14/2015 Document Reviewed: 03/10/2012 Elsevier Interactive Patient Education 2016 Elsevier Inc. Laparoscopic Tubal Ligation Laparoscopic tubal ligation is a procedure that closes the fallopian tubes at a time other than right after childbirth. When the fallopian tubes are closed, the eggs that are released from the ovaries cannot enter the uterus, and  sperm cannot reach the egg. Tubal ligation is also known as getting your "tubes tied." Tubal ligation is done so you will not be able to get pregnant or have a baby. Although this procedure may be undone (reversed), it should be considered permanent and irreversible. If you want to have future pregnancies, you should not have this  procedure. LET [facility] CARE PROVIDER KNOW ABOUT:  Any allergies you have.  All medicines you are taking, including vitamins, herbs, eye drops, creams, and over-the-counter medicines. This includes any use of steroids, either by mouth or in cream form.  Previous problems you or members of your family have had with the use of anesthetics.  Any blood disorders you have.  Previous surgeries you have had.  Any medical conditions you may have.  Possibility of pregnancy, if this applies.  Any past pregnancies. RISKS AND COMPLICATIONS  Infection.  Bleeding.  Injury to surrounding organs.  Side effects from anesthetics.  Failure of the procedure.  Ectopic pregnancy.  Future regret about having the procedure done. BEFORE THE PROCEDURE  Ask your health care provider about:  Changing or stopping your regular medicines. This is especially important if you are taking diabetes medicines or blood thinners.  Taking medicines such as aspirin and ibuprofen. These medicines can thin your blood. Do not take these medicines before your procedure if your health care provider instructs you not to.  Follow instructions from your health care provider about eating and drinking restrictions.  Plan to have someone take you home after the procedure.  If you go home right after the procedure, plan to have someone with you for 24 hours. PROCEDURE  You will be given one or more of the following:  A medicine that helps you relax (sedative).  A medicine that numbs the area (local anesthetic).  A medicine that makes you fall asleep (general anesthetic).  A medicine that is injected into an area of your body that numbs everything below the injection site (regional anesthetic).  If you have been given general anesthetic, a tube will be put down your throat to help you breathe.  Two small cuts (incisions) will be made in the lower abdominal area and near the belly button.  Your bladder may  be emptied with a small tube (catheter).  Your abdomen will be inflated with a safe gas (carbon dioxide). This will help to give the surgeon room to operate and visualize, and it will help the surgeon to avoid other organs.  A thin, lighted tube (laparoscope) with a camera attached will be inserted into your abdomen through one of the incisions near the belly button. Other small instruments will be inserted through the other abdominal incision.  The fallopian tubes will be tied off or burned (cauterized), or they will be blocked with a clip, ring, or clamp. In many cases, a small portion in the center of each fallopian tube will also be removed.  After the fallopian tubes are blocked, the gas will be released from the abdomen.  The incisions will be closed with stitches (sutures).  A bandage (dressing) will be placed over the incisions. The procedure may vary among health care providers and hospitals. AFTER THE PROCEDURE  Your blood pressure, heart rate, breathing rate, and blood oxygen level will be monitored often until the medicines you were given have worn off.  You will be given pain medicine as needed.  If you had general anesthetic, you may have some mild discomfort in your throat. This is from  the breathing tube that was placed in your throat while you were sleeping.  You may experience discomfort in the shoulder area from some trapped air between your liver and your diaphragm. This sensation is normal, and it will slowly go away on its own.  You will have some mild abdominal discomfort for 3--7 days.   This information is not intended to replace advice given to you by your health care provider. Make sure you discuss any questions you have with your health care provider.   Document Released: 03/06/2001 Document Revised: 04/14/2015 Document Reviewed: 03/10/2012 Elsevier Interactive Patient Education [employer].

## 2016-01-07 ENCOUNTER — Telehealth: Payer: Self-pay

## 2016-01-07 DIAGNOSIS — Z1239 Encounter for other screening for malignant neoplasm of breast: Secondary | ICD-10-CM

## 2016-01-07 NOTE — Telephone Encounter (Signed)
Patient notified

## 2016-01-07 NOTE — Telephone Encounter (Signed)
Patient states she needs a referral to have a Mammogram done, since her work doesn't offer them anymore. She feels like her last one was in October 2015 but is not sure of this.

## 2016-01-07 NOTE — Telephone Encounter (Signed)
She still needs to call them, but I entered the order

## 2016-01-20 ENCOUNTER — Encounter
Admission: RE | Admit: 2016-01-20 | Discharge: 2016-01-20 | Disposition: A | Discharge status: Home / Self Care | Payer: Managed Care, Other (non HMO) | Source: Ambulatory Visit | Attending: Obstetrics and Gynecology | Admitting: Obstetrics and Gynecology

## 2016-01-20 ENCOUNTER — Telehealth: Payer: Self-pay | Admitting: Family Medicine

## 2016-01-20 ENCOUNTER — Ambulatory Visit (INDEPENDENT_AMBULATORY_CARE_PROVIDER_SITE_OTHER): Payer: Managed Care, Other (non HMO) | Admitting: Obstetrics and Gynecology

## 2016-01-20 ENCOUNTER — Encounter: Payer: Self-pay | Admitting: Obstetrics and Gynecology

## 2016-01-20 VITALS — BP 117/70 | HR 73 | Temp 98.4°F | Ht 65.0 in | Wt 124.4 lb

## 2016-01-20 DIAGNOSIS — Z01818 Encounter for other preprocedural examination: Secondary | ICD-10-CM

## 2016-01-20 DIAGNOSIS — Z01812 Encounter for preprocedural laboratory examination: Secondary | ICD-10-CM | POA: Insufficient documentation

## 2016-01-20 DIAGNOSIS — Z302 Encounter for sterilization: Secondary | ICD-10-CM

## 2016-01-20 HISTORY — DX: Headache, unspecified: R51.9

## 2016-01-20 HISTORY — DX: Anxiety disorder, unspecified: F41.9

## 2016-01-20 HISTORY — DX: Headache: R51

## 2016-01-20 LAB — CBC WITH DIFFERENTIAL/PLATELET
BASOS ABS: 0.1 10*3/uL (ref 0–0.1)
BASOS PCT: 1 %
EOS ABS: 0.1 10*3/uL (ref 0–0.7)
EOS PCT: 2 %
HCT: 34.1 % — ABNORMAL LOW (ref 35.0–47.0)
Hemoglobin: 11.2 g/dL — ABNORMAL LOW (ref 12.0–16.0)
LYMPHS PCT: 37 %
Lymphs Abs: 2 10*3/uL (ref 1.0–3.6)
MCH: 31.1 pg (ref 26.0–34.0)
MCHC: 32.8 g/dL (ref 32.0–36.0)
MCV: 94.8 fL (ref 80.0–100.0)
Monocytes Absolute: 0.3 10*3/uL (ref 0.2–0.9)
Monocytes Relative: 6 %
Neutro Abs: 2.8 10*3/uL (ref 1.4–6.5)
Neutrophils Relative %: 54 %
PLATELETS: 191 10*3/uL (ref 150–440)
RBC: 3.59 MIL/uL — AB (ref 3.80–5.20)
RDW: 12.6 % (ref 11.5–14.5)
WBC: 5.3 10*3/uL (ref 3.6–11.0)

## 2016-01-20 LAB — RAPID HIV SCREEN (HIV 1/2 AB+AG)
HIV 1/2 ANTIBODIES: NONREACTIVE
HIV-1 P24 ANTIGEN - HIV24: NONREACTIVE

## 2016-01-20 NOTE — Pre-Procedure Instructions (Signed)
Called for orders. 

## 2016-01-20 NOTE — Telephone Encounter (Signed)
Pt would like a call back about a medication. °

## 2016-01-20 NOTE — Patient Instructions (Signed)
1.  Return in 1 week for postop check 

## 2016-01-20 NOTE — H&P (Signed)
GYN ENCOUNTER NOTE  Subjective:       Jessica Carroll is a 45 y.o. G0P0000 female scheduled for laparoscopic bilateral salpingectomy. Patient seeking permanent sterilization.  Patient with mild headache due to sinus pressures. States neck does feel slightly swollen. Denies fever, chills, night sweats. Denies SOB, DOE, cough. Denies ear pain, nasal congestion, post nasal drip.  Patient with history of asthma, no recent exacerbations or hospitalizations.    Gynecologic History Patient's last menstrual period was 12/06/2015. Menarche-age 68. Cycles monthly. Duration of flow 4 days. History of dysmenorrhea, moderate, colon typically does not take medications; does report perimenstrual low backache without radiation into the buttocks or thighs. History of deep thrusting dyspareunia. No family history of ovarian cancer. No family history of endometriosis No history of abnormal Pap smears. No history of PID/STI Patient's last menstrual period was 12/09/2015. Contraception: Depo-Provera injections Last Pap: Normal.   Obstetric History OB History  Gravida Para Term Preterm AB SAB TAB Ectopic Multiple Living  0 0 0 0 0 0 0 0 0 0         Past Medical History  Diagnosis Date  . Diffuse cystic mastopathy   . Asthma 2011  . GERD (gastroesophageal reflux disease)     Past Surgical History  Procedure Laterality Date  . Upper gi endoscopy  07/19/2012  . Corneal transplant Right 01/07/2013  . Eyelid surgery Right June 19, 2015    [facility] by Dr. Norlene Duel    Current Outpatient Prescriptions on File Prior to Visit  Medication Sig Dispense Refill  . albuterol (PROVENTIL HFA;VENTOLIN HFA) 108 (90 BASE) MCG/ACT inhaler Inhale 2 puffs into the lungs as needed.    . ALPRAZolam (XANAX) 0.5 MG tablet Take 1 tablet (0.5 mg total) by mouth 2 (two) times daily as needed for anxiety. For sleep 30 tablet 0  . cyclobenzaprine (FLEXERIL) 10 MG tablet Take 1 tablet (10 mg total) by mouth as  needed. 30 tablet 0  . DEXILANT 60 MG capsule Take 1 capsule (60 mg total) by mouth daily. 30 capsule 2  . fluticasone (FLONASE) 50 MCG/ACT nasal spray Place 2 sprays into the nose as needed.    . Fluticasone-Salmeterol (ADVAIR) 250-50 MCG/DOSE AEPB Inhale 2 puffs into the lungs 2 (two) times daily. 60 each 5  . ibuprofen (ADVIL,MOTRIN) 800 MG tablet Take 1 tablet by mouth 3 (three) times daily as needed.    . MULTIPLE VITAMIN PO Take 1 tablet by mouth daily.    . prednisoLONE acetate (PRED FORTE) 1 % ophthalmic suspension Administer 1 drop to the right eye daily.    Marland Kitchen PROCTOFOAM HC rectal foam   0  . promethazine (PHENERGAN) 25 MG tablet Take 1 tablet (25 mg total) by mouth 2 (two) times daily as needed for nausea. 30 tablet 1  . ranitidine (ZANTAC) 300 MG capsule Take 1 capsule (300 mg total) by mouth every evening. 30 capsule 0  . rizatriptan (MAXALT) 10 MG tablet Take 1 tablet (10 mg total) by mouth 2 (two) times daily as needed. 10 tablet 2  . tacrolimus (PROTOPIC) 0.03 % ointment Apply topically.    . triamcinolone cream (KENALOG) 0.1 % 1 application as needed.    . valACYclovir (VALTREX) 500 MG tablet Take 1 tablet by mouth 2 (two) times daily.  0  . zolpidem (AMBIEN) 5 MG tablet Take 1 tablet (5 mg total) by mouth at bedtime as needed for sleep. 15 tablet 1   No current facility-administered medications on file prior to  visit.    Allergies  Allergen Reactions  . Penicillins Nausea Only    Social History   Social History  . Marital Status: Single    Spouse Name: N/A  . Number of Children: N/A  . Years of Education: 12   Occupational History  . textile    Social History Main Topics  . Smoking status: Never Smoker   . Smokeless tobacco: Never Used  . Alcohol Use: No  . Drug Use: No  . Sexual Activity:    Partners: Male    Birth Control/ Protection: None   Other Topics Concern  . Not on file   Social History Narrative    Family History  Problem Relation Age of  Onset  . Adopted: Yes  . Hyperlipidemia Mother   . Hypertension Mother   . Diabetes Father   . Heart failure Father 15    hear attack  . Cancer Neg Hx     The following portions of the patient's history were reviewed and updated as appropriate: allergies, current medications, past family history, past medical history, past social history, past surgical history and problem list.  Review of Systems See above.   Objective:   BP 117/70 mmHg  Pulse 73  Ht 5\' 5"  (1.651 m)  Wt 124 lb 6.4 oz (56.427 kg)  BMI 20.70 kg/m2  LMP 12/06/2015 Temp 98.58F CONSTITUTIONAL: Well-developed, well-nourished female in no acute distress.  HENT:  Normocephalic, atraumatic.  NECK: Normal range of motion, supple, no masses.  Normal thyroid. No lymphadenopathy.  THROAT: No evidence of post nasal drip, no erythema.  SKIN: Skin is warm and dry. No rash noted. Not diaphoretic. No erythema. No pallor. [place]: Alert and oriented to person, place, and time.  PSYCHIATRIC: Normal mood and affect. Normal behavior. Normal judgment and thought content. CARDIOVASCULAR: Normal S1, S2. No r/m/g. RESPIRATORY: Clear to auscultation. No wheezing, crackles.  BREASTS: Not Examined ABDOMEN: Deferred till OR PELVIC: Not Examined      Assessment:   1. Patient desires sterilization. Partner declines vasectomy. 2. History of dysmenorrhea. 3. History of deep thrusting dyspareunia  Plan:   1. Follow up for surgery as scheduled -  laparoscopic bilateral salpingectomy 2. Pre op counseling: Patient is to undergo laparoscopic bilateral salpingectomy for Desired sterilization  On 01/25/2016.  She is understanding of the planned procedures and is aware of and is accepting of all surgical risks which include but are not limited to bleeding, infection, pelvic organ injury with need for repair, blood clot disorders, anesthesia risks, etc.  Patient does understand that the salpingectomy will not alter her menstrual cycles.   All questions have been answered.  Informed consent is given.   Cathlean Sauer, PA-S [name], MD   I have seen, interviewed, and examined the patient in conjunction with the [facility].A. student and affirm the diagnosis and management plan. Braley Luckenbaugh A. Justeen Hehr, MD, FACOG  Note: This dictation was prepared with Dragon dictation along with smaller phrase technology. Any transcriptional errors that result from this process are unintentional.

## 2016-01-20 NOTE — Patient Instructions (Signed)
  Your procedure is scheduled on: 01/25/16 Mon  Report to Day Surgery.2nd floor medical mall To find out your arrival time please call [phone] between 1PM - 3PM on 01/22/16 Fri .  Remember: Instructions that are not followed completely may result in serious medical risk, up to and including death, or upon the discretion of your surgeon and anesthesiologist your surgery may need to be rescheduled.    __x__ 1. Do not eat food or drink liquids after midnight. No gum chewing or hard candies.     __x__ 2. No Alcohol for 24 hours before or after surgery.   ____ 3. Bring all medications with you on the day of surgery if instructed.    __x__ 4. Notify your doctor if there is any change in your medical condition     (cold, fever, infections).     Do not wear jewelry, make-up, hairpins, clips or nail polish.  Do not wear lotions, powders, or perfumes. You may wear deodorant.  Do not shave 48 hours prior to surgery. Men may shave face and neck.  Do not bring valuables to the hospital.    [facility] is not responsible for any belongings or valuables.               Contacts, dentures or bridgework may not be worn into surgery.  Leave your suitcase in the car. After surgery it may be brought to your room.  For patients admitted to the hospital, discharge time is determined by your                treatment team.   Patients discharged the day of surgery will not be allowed to drive home.   Please read over the following fact sheets that you were given:      __x__ Take these medicines the morning of surgery with A SIP OF WATER:    1. DEXILANT 60 MG capsule  2. Fluticasone-Salmeterol (ADVAIR) 250-50 MCG/DOSE AEPB  3.   4.  5.  6.  ____ Fleet Enema (as directed)   __x__ Use CHG Soap as directed  ____ Use inhalers on the day of surgery  ____ Stop metformin 2 days prior to surgery    ____ Take 1/2 of usual insulin dose the night before surgery and none on the morning of surgery.    ____ Stop Coumadin/Plavix/aspirin on   _x___ Stop Anti-inflammatories on stop ibuprofen now until after surgery   ____ Stop supplements until after surgery.    ____ Bring C-Pap to the hospital.

## 2016-01-21 ENCOUNTER — Other Ambulatory Visit: Payer: Self-pay

## 2016-01-21 LAB — RPR: RPR Ser Ql: NONREACTIVE

## 2016-01-21 MED ORDER — PROCTOFOAM HC 1-1 % RE FOAM: Freq: Three times a day (TID) | RECTAL | Status: DC

## 2016-01-21 NOTE — Telephone Encounter (Signed)
Patient wanted to see if you would fill this medication since she no longer see Melody Burr at [facility].

## 2016-01-21 NOTE — Progress Notes (Signed)
Preoperative history and physical is completed. Preoperative counseling is completed. See H&P dictation In admission orders module.

## 2016-01-25 ENCOUNTER — Ambulatory Visit
Admission: RE | Admit: 2016-01-25 | Discharge: 2016-01-25 | Disposition: A | Discharge status: Home / Self Care | Payer: Managed Care, Other (non HMO) | Source: Ambulatory Visit | Attending: Obstetrics and Gynecology | Admitting: Obstetrics and Gynecology

## 2016-01-25 ENCOUNTER — Ambulatory Visit: Payer: Managed Care, Other (non HMO) | Admitting: Anesthesiology

## 2016-01-25 ENCOUNTER — Encounter: Payer: Self-pay | Admitting: Obstetrics and Gynecology

## 2016-01-25 ENCOUNTER — Encounter
Admission: RE | Disposition: A | Discharge status: Home / Self Care | Payer: Self-pay | Source: Ambulatory Visit | Attending: Obstetrics and Gynecology

## 2016-01-25 DIAGNOSIS — G8929 Other chronic pain: Secondary | ICD-10-CM | POA: Diagnosis not present

## 2016-01-25 DIAGNOSIS — Z947 Corneal transplant status: Secondary | ICD-10-CM | POA: Insufficient documentation

## 2016-01-25 DIAGNOSIS — J45909 Unspecified asthma, uncomplicated: Secondary | ICD-10-CM | POA: Diagnosis not present

## 2016-01-25 DIAGNOSIS — N9419 Other specified dyspareunia: Secondary | ICD-10-CM

## 2016-01-25 DIAGNOSIS — Z302 Encounter for sterilization: Secondary | ICD-10-CM | POA: Insufficient documentation

## 2016-01-25 DIAGNOSIS — Z7951 Long term (current) use of inhaled steroids: Secondary | ICD-10-CM | POA: Insufficient documentation

## 2016-01-25 DIAGNOSIS — Z8489 Family history of other specified conditions: Secondary | ICD-10-CM | POA: Diagnosis not present

## 2016-01-25 DIAGNOSIS — N809 Endometriosis, unspecified: Secondary | ICD-10-CM | POA: Diagnosis not present

## 2016-01-25 DIAGNOSIS — R51 Headache: Secondary | ICD-10-CM | POA: Insufficient documentation

## 2016-01-25 DIAGNOSIS — N946 Dysmenorrhea, unspecified: Secondary | ICD-10-CM | POA: Diagnosis not present

## 2016-01-25 DIAGNOSIS — K219 Gastro-esophageal reflux disease without esophagitis: Secondary | ICD-10-CM | POA: Diagnosis not present

## 2016-01-25 DIAGNOSIS — N941 Unspecified dyspareunia: Secondary | ICD-10-CM | POA: Diagnosis not present

## 2016-01-25 DIAGNOSIS — R102 Pelvic and perineal pain: Secondary | ICD-10-CM | POA: Diagnosis not present

## 2016-01-25 DIAGNOSIS — Z8249 Family history of ischemic heart disease and other diseases of the circulatory system: Secondary | ICD-10-CM | POA: Insufficient documentation

## 2016-01-25 DIAGNOSIS — Z79899 Other long term (current) drug therapy: Secondary | ICD-10-CM | POA: Insufficient documentation

## 2016-01-25 DIAGNOSIS — Z833 Family history of diabetes mellitus: Secondary | ICD-10-CM | POA: Diagnosis not present

## 2016-01-25 HISTORY — PX: LAPAROSCOPIC BILATERAL SALPINGECTOMY: SHX5889

## 2016-01-25 LAB — POCT PREGNANCY, URINE: Preg Test, Ur: NEGATIVE

## 2016-01-25 SURGERY — SALPINGECTOMY, BILATERAL, LAPAROSCOPIC
Anesthesia: General | Site: Abdomen | Wound class: Clean Contaminated

## 2016-01-25 MED ORDER — MIDAZOLAM HCL 2 MG/2ML IJ SOLN
INTRAMUSCULAR | Status: DC | PRN
  Administered 2016-01-25: 2 mg via INTRAVENOUS

## 2016-01-25 MED ORDER — DEXAMETHASONE SODIUM PHOSPHATE 10 MG/ML IJ SOLN
INTRAMUSCULAR | Status: DC | PRN
  Administered 2016-01-25: 5 mg via INTRAVENOUS

## 2016-01-25 MED ORDER — GLYCOPYRROLATE 0.2 MG/ML IJ SOLN
INTRAMUSCULAR | Status: DC | PRN
  Administered 2016-01-25: 0.6 mg via INTRAVENOUS

## 2016-01-25 MED ORDER — CITRIC ACID-SODIUM CITRATE 334-500 MG/5ML PO SOLN
30.0000 mL | ORAL | Status: DC
  Filled 2016-01-25: qty 30

## 2016-01-25 MED ORDER — FENTANYL CITRATE (PF) 100 MCG/2ML IJ SOLN
INTRAMUSCULAR | Status: DC | PRN
  Administered 2016-01-25: 50 ug via INTRAVENOUS
  Administered 2016-01-25: 25 ug via INTRAVENOUS
  Administered 2016-01-25: 50 ug via INTRAVENOUS
  Administered 2016-01-25: 25 ug via INTRAVENOUS

## 2016-01-25 MED ORDER — LIDOCAINE HCL (CARDIAC) 20 MG/ML IV SOLN
INTRAVENOUS | Status: DC | PRN
  Administered 2016-01-25: 60 mg via INTRAVENOUS

## 2016-01-25 MED ORDER — NEOSTIGMINE METHYLSULFATE 10 MG/10ML IV SOLN
INTRAVENOUS | Status: DC | PRN
  Administered 2016-01-25: 3 mg via INTRAVENOUS

## 2016-01-25 MED ORDER — ROCURONIUM BROMIDE 100 MG/10ML IV SOLN
INTRAVENOUS | Status: DC | PRN
  Administered 2016-01-25: 25 mg via INTRAVENOUS

## 2016-01-25 MED ORDER — OXYCODONE-ACETAMINOPHEN 5-325 MG PO TABS: 1.0000 | ORAL_TABLET | ORAL | Status: DC | PRN

## 2016-01-25 MED ORDER — LACTATED RINGERS IV SOLN
INTRAVENOUS | Status: DC
  Administered 2016-01-25 (×2): via INTRAVENOUS

## 2016-01-25 MED ORDER — IBUPROFEN 800 MG PO TABS: 800.0000 mg | ORAL_TABLET | Freq: Three times a day (TID) | ORAL | Status: DC

## 2016-01-25 MED ORDER — BUPIVACAINE-EPINEPHRINE (PF) 0.5% -1:200000 IJ SOLN
INTRAMUSCULAR | Status: AC
  Filled 2016-01-25: qty 30

## 2016-01-25 MED ORDER — PROPOFOL 10 MG/ML IV BOLUS
INTRAVENOUS | Status: DC | PRN
  Administered 2016-01-25: 100 mg via INTRAVENOUS
  Administered 2016-01-25: 30 mg via INTRAVENOUS

## 2016-01-25 MED ORDER — KETOROLAC TROMETHAMINE 30 MG/ML IJ SOLN
INTRAMUSCULAR | Status: DC | PRN
  Administered 2016-01-25: 30 mg via INTRAVENOUS

## 2016-01-25 MED ORDER — LACTATED RINGERS IV SOLN: INTRAVENOUS | Status: DC

## 2016-01-25 SURGICAL SUPPLY — 31 items
ANCHOR TIS RET SYS 235ML (MISCELLANEOUS) IMPLANT
BLADE SURG SZ11 CARB STEEL (BLADE) ×2 IMPLANT
CANISTER SUCT 1200ML W/VALVE (MISCELLANEOUS) ×2 IMPLANT
CATH ROBINSON RED A/P 16FR (CATHETERS) ×2 IMPLANT
CHLORAPREP W/TINT 26ML (MISCELLANEOUS) ×2 IMPLANT
GLOVE BIO SURGEON STRL SZ8 (GLOVE) ×4 IMPLANT
GLOVE INDICATOR 8.0 STRL GRN (GLOVE) ×2 IMPLANT
GOWN STRL REUS W/ TWL LRG LVL3 (GOWN DISPOSABLE) ×2 IMPLANT
GOWN STRL REUS W/ TWL XL LVL3 (GOWN DISPOSABLE) ×1 IMPLANT
GOWN STRL REUS W/TWL LRG LVL3 (GOWN DISPOSABLE) ×2
GOWN STRL REUS W/TWL XL LVL3 (GOWN DISPOSABLE) ×1
IRRIGATION STRYKERFLOW (MISCELLANEOUS) ×1 IMPLANT
IRRIGATOR STRYKERFLOW (MISCELLANEOUS) ×2
IV LACTATED RINGERS 1000ML (IV SOLUTION) ×2 IMPLANT
KIT RM TURNOVER CYSTO AR (KITS) ×2 IMPLANT
LABEL OR SOLS (LABEL) ×2 IMPLANT
LIQUID BAND (GAUZE/BANDAGES/DRESSINGS) ×2 IMPLANT
NS IRRIG 1000ML POUR BTL (IV SOLUTION) ×2 IMPLANT
NS IRRIG 500ML POUR BTL (IV SOLUTION) ×2 IMPLANT
PACK GYN LAPAROSCOPIC (MISCELLANEOUS) ×2 IMPLANT
PAD PREP 24X41 OB/GYN DISP (PERSONAL CARE ITEMS) ×2 IMPLANT
POUCH ENDO CATCH 10MM SPEC (MISCELLANEOUS) ×2 IMPLANT
SCISSORS METZENBAUM CVD 33 (INSTRUMENTS) ×2 IMPLANT
SHEARS HARMONIC ACE PLUS 36CM (ENDOMECHANICALS) ×4 IMPLANT
SLEEVE ENDOPATH XCEL 5M (ENDOMECHANICALS) ×6 IMPLANT
SUT PLAIN 4 0 FS 2 27 (SUTURE) ×4 IMPLANT
SUT VIC AB 0 UR5 27 (SUTURE) ×2 IMPLANT
TROCAR 5M 150ML BLDLS (TROCAR) ×2 IMPLANT
TROCAR ENDO BLADELESS 11MM (ENDOMECHANICALS) ×2 IMPLANT
TROCAR XCEL NON-BLD 5MMX100MML (ENDOMECHANICALS) ×2 IMPLANT
TUBING INSUFFLATOR HI FLOW (MISCELLANEOUS) ×2 IMPLANT

## 2016-01-25 NOTE — Discharge Instructions (Signed)

## 2016-01-25 NOTE — Op Note (Signed)
Jessica Carroll PROCEDURE DATE: 01/25/2016 10:22 AM  PREOPERATIVE DIAGNOSIS: DESIRES STERILIZATION,DYSMENORRHEA,DYSPAREUNIA POSTOPERATIVE DIAGNOSIS: DESIRES STERILIZATION, CHRONIC PELVIC PAIN, ENDOMETRIOSIS PROCEDURE: Procedure(s): LAPAROSCOPIC BILATERAL SALPINGECTOMY, ADHESIOLYSIS, LAPAROSCOPIC EXCISION/FULGERATION ENDOMETRIOSIS  (N/A) SURGEON:  Dr. Hassell Done [name] ASSISTANT: None ANESTHESIA: General Endotracheal  INDICATIONS: 45 y.o. G0P0000 with history of chronic pelvic pain concerning for endometriosis desiring surgical evaluation and surgical sterilization.   Please see preoperative notes for further details.   FINDINGS:   1. Gynecoid pelvis; excellent uterine descensus with tenaculum traction  2. Small uterus, normal ovaries and fallopian tubes bilaterally.  3. Powder burn implants on the bladder flap, right side, and lower uterine segment, left side were excised and cauterized with Kleppinger bipolar forceps.  4. Left adnexal adhesions were taken down with the Harmonic scalpel.  Normal upper abdomen.   I/O's: Total I/O In: 1000 [I.V.:1000] Out: 400 [Urine:400] SPECIMENS: Peritoneal biopsies 2 (anterior bladder flap, right; lower uterine segment, left COMPLICATIONS: None immediate COUNTS:  YES  PROCEDURE IN DETAIL: The patient was brought to the operating room where she was placed in the supine position.  General endotracheal anesthesia was induced without difficulty.  She was placed in the dorsal lithotomy position using bumblebee stirrups.  A ChloraPrep and Betadine, abdominal, perineal, intravaginal prep and drape was performed in standard fashion.  The timeout was completed.  The red Robison catheter was used to drain the bladder of clear urine.  A weighted speculum was placed into the vagina and a Hulka tenaculum was placed onto the cervix to facilitate uterine manipulation. Bimanual exam demonstrated a gynecoid pelvis and excellent uterine descensus with tenaculum  traction. Laparoscopy was performed in standard fashion.  The Optiview 5 mm trocar and sleeve were placed through a 5 mm subumbilical incision directly into the abdominal pelvic cavity, without evidence of bowel or vascular injury. Two Additional 5 mm ports were placed in the lower quadrants, respectively, under direct visualization.  The above noted findings were photo documented. The Ace Harmonic scalpel was used to take down the left-sided adnexal/bowel adhesions in order to restore normal anatomy in the left adnexa. Using grasping forceps as well as the Ace Harmonic scalpel, right and left fallopian tubes were grasped mobilized and cut with the aid of the Harmonic scalpel along the mesosalpinx. Both specimens were removed through the 5 mm lower abdominal port sites. The powder burn implants on the bladder flap and uterine segment were removed with biopsy forceps. These were sent to pathology. The biopsy sites were cauterized using Kleppinger bipolar forceps. Good hemostasis was noted. Following completion of the procedures, all instrumentation was removed from the abdominal pelvic cavity. Pneumoperitoneum was released. Incisions were closed with 3-0 Monocryl simple interrupted sutures. Telfa/Tegaderm dressings were applied. Patient was then mobilized extubated and taken to recovery in satisfactory condition Estimated blood loss was minimal All instruments,  Needles, sponge counts were verified as correct.  [name], MD [facility]  ADDENDUM:   The patient is an excellent transvaginal hysterectomy candidate.

## 2016-01-25 NOTE — Anesthesia Preprocedure Evaluation (Addendum)
Anesthesia Evaluation  Patient identified by MRN, date of birth, ID band Patient awake    Reviewed: Allergy & Precautions, H&P , NPO status , Patient's Chart, lab work & pertinent test results, reviewed documented beta blocker date and time   History of Anesthesia Complications (+) PONV  Airway Mallampati: II  TM Distance: >3 FB Neck ROM: full    Dental  (+) Teeth Intact   Pulmonary neg pulmonary ROS, asthma ,    Pulmonary exam normal breath sounds clear to auscultation       Cardiovascular negative cardio ROS Normal cardiovascular exam Rhythm:regular Rate:Normal     Neuro/Psych  Headaches, negative neurological ROS  negative psych ROS   GI/Hepatic negative GI ROS, Neg liver ROS, GERD  ,  Endo/Other  negative endocrine ROS  Renal/GU negative Renal ROS  negative genitourinary   Musculoskeletal   Abdominal   Peds  Hematology negative hematology ROS (+)   Anesthesia Other Findings Past Medical History:   Diffuse cystic mastopathy                                    Asthma                                          2011         GERD (gastroesophageal reflux disease)                       Anxiety                                                      Headache                                                   Past Surgical History:   UPPER GI ENDOSCOPY                               07/19/2012   CORNEAL TRANSPLANT                              Right 01/07/2013    eyelid surgery                                  Right July 8, 2*     Comment:[place] by Dr. Norlene Duel BMI    Body Mass Index   20.63 kg/m 2     Reproductive/Obstetrics negative OB ROS                            Anesthesia Physical Anesthesia Plan  ASA: II  Anesthesia Plan: General ETT   Post-op Pain Management:    Induction:   Airway Management Planned:   Additional Equipment:   Intra-op Plan:   Post-operative Plan:  Informed Consent: I have reviewed the patients History and Physical, chart, labs and discussed the procedure including the risks, benefits and alternatives for the proposed anesthesia with the patient or authorized representative who has indicated his/her understanding and acceptance.   Dental Advisory Given  Plan Discussed with: CRNA  Anesthesia Plan Comments:         Anesthesia Quick Evaluation

## 2016-01-25 NOTE — H&P (View-Only) (Signed)
GYN ENCOUNTER NOTE  Subjective:       Jessica Carroll is a 45 y.o. G0P0000 female scheduled for laparoscopic bilateral salpingectomy. Patient seeking permanent sterilization.  Patient with mild headache due to sinus pressures. States neck does feel slightly swollen. Denies fever, chills, night sweats. Denies SOB, DOE, cough. Denies ear pain, nasal congestion, post nasal drip.  Patient with history of asthma, no recent exacerbations or hospitalizations.    Gynecologic History Patient's last menstrual period was 12/06/2015. Menarche-age 33. Cycles monthly. Duration of flow 4 days. History of dysmenorrhea, moderate, colon typically does not take medications; does report perimenstrual low backache without radiation into the buttocks or thighs. History of deep thrusting dyspareunia. No family history of ovarian cancer. No family history of endometriosis No history of abnormal Pap smears. No history of PID/STI Patient's last menstrual period was 12/09/2015. Contraception: Depo-Provera injections Last Pap: Normal.   Obstetric History OB History  Gravida Para Term Preterm AB SAB TAB Ectopic Multiple Living  0 0 0 0 0 0 0 0 0 0         Past Medical History  Diagnosis Date  . Diffuse cystic mastopathy   . Asthma 2011  . GERD (gastroesophageal reflux disease)     Past Surgical History  Procedure Laterality Date  . Upper gi endoscopy  07/19/2012  . Corneal transplant Right 01/07/2013  . Eyelid surgery Right June 19, 2015    [facility] by Dr. Norlene Duel    Current Outpatient Prescriptions on File Prior to Visit  Medication Sig Dispense Refill  . albuterol (PROVENTIL HFA;VENTOLIN HFA) 108 (90 BASE) MCG/ACT inhaler Inhale 2 puffs into the lungs as needed.    . ALPRAZolam (XANAX) 0.5 MG tablet Take 1 tablet (0.5 mg total) by mouth 2 (two) times daily as needed for anxiety. For sleep 30 tablet 0  . cyclobenzaprine (FLEXERIL) 10 MG tablet Take 1 tablet (10 mg total) by mouth as  needed. 30 tablet 0  . DEXILANT 60 MG capsule Take 1 capsule (60 mg total) by mouth daily. 30 capsule 2  . fluticasone (FLONASE) 50 MCG/ACT nasal spray Place 2 sprays into the nose as needed.    . Fluticasone-Salmeterol (ADVAIR) 250-50 MCG/DOSE AEPB Inhale 2 puffs into the lungs 2 (two) times daily. 60 each 5  . ibuprofen (ADVIL,MOTRIN) 800 MG tablet Take 1 tablet by mouth 3 (three) times daily as needed.    . MULTIPLE VITAMIN PO Take 1 tablet by mouth daily.    . prednisoLONE acetate (PRED FORTE) 1 % ophthalmic suspension Administer 1 drop to the right eye daily.    Marland Kitchen PROCTOFOAM HC rectal foam   0  . promethazine (PHENERGAN) 25 MG tablet Take 1 tablet (25 mg total) by mouth 2 (two) times daily as needed for nausea. 30 tablet 1  . ranitidine (ZANTAC) 300 MG capsule Take 1 capsule (300 mg total) by mouth every evening. 30 capsule 0  . rizatriptan (MAXALT) 10 MG tablet Take 1 tablet (10 mg total) by mouth 2 (two) times daily as needed. 10 tablet 2  . tacrolimus (PROTOPIC) 0.03 % ointment Apply topically.    . triamcinolone cream (KENALOG) 0.1 % 1 application as needed.    . valACYclovir (VALTREX) 500 MG tablet Take 1 tablet by mouth 2 (two) times daily.  0  . zolpidem (AMBIEN) 5 MG tablet Take 1 tablet (5 mg total) by mouth at bedtime as needed for sleep. 15 tablet 1   No current facility-administered medications on file prior to  visit.    Allergies  Allergen Reactions  . Penicillins Nausea Only    Social History   Social History  . Marital Status: Single    Spouse Name: N/A  . Number of Children: N/A  . Years of Education: 12   Occupational History  . textile    Social History Main Topics  . Smoking status: Never Smoker   . Smokeless tobacco: Never Used  . Alcohol Use: No  . Drug Use: No  . Sexual Activity:    Partners: Male    Birth Control/ Protection: None   Other Topics Concern  . Not on file   Social History Narrative    Family History  Problem Relation Age of  Onset  . Adopted: Yes  . Hyperlipidemia Mother   . Hypertension Mother   . Diabetes Father   . Heart failure Father 41    hear attack  . Cancer Neg Hx     The following portions of the patient's history were reviewed and updated as appropriate: allergies, current medications, past family history, past medical history, past social history, past surgical history and problem list.  Review of Systems See above.   Objective:   BP 117/70 mmHg  Pulse 73  Ht 5\' 5"  (1.651 m)  Wt 124 lb 6.4 oz (56.427 kg)  BMI 20.70 kg/m2  LMP 12/06/2015 Temp 98.66F CONSTITUTIONAL: Well-developed, well-nourished female in no acute distress.  HENT:  Normocephalic, atraumatic.  NECK: Normal range of motion, supple, no masses.  Normal thyroid. No lymphadenopathy.  THROAT: No evidence of post nasal drip, no erythema.  SKIN: Skin is warm and dry. No rash noted. Not diaphoretic. No erythema. No pallor. [place]: Alert and oriented to person, place, and time.  PSYCHIATRIC: Normal mood and affect. Normal behavior. Normal judgment and thought content. CARDIOVASCULAR: Normal S1, S2. No r/m/g. RESPIRATORY: Clear to auscultation. No wheezing, crackles.  BREASTS: Not Examined ABDOMEN: Deferred till OR PELVIC: Not Examined      Assessment:   1. Patient desires sterilization. Partner declines vasectomy. 2. History of dysmenorrhea. 3. History of deep thrusting dyspareunia  Plan:   1. Follow up for surgery as scheduled -  laparoscopic bilateral salpingectomy 2. Pre op counseling: Patient is to undergo laparoscopic bilateral salpingectomy for Desired sterilization  On 01/25/2016.  She is understanding of the planned procedures and is aware of and is accepting of all surgical risks which include but are not limited to bleeding, infection, pelvic organ injury with need for repair, blood clot disorders, anesthesia risks, etc.  Patient does understand that the salpingectomy will not alter her menstrual cycles.   All questions have been answered.  Informed consent is given.   Cathlean Sauer, PA-S [name], MD   I have seen, interviewed, and examined the patient in conjunction with the [facility].A. student and affirm the diagnosis and management plan. Yajaira Doffing A. Merla Sawka, MD, FACOG  Note: This dictation was prepared with Dragon dictation along with smaller phrase technology. Any transcriptional errors that result from this process are unintentional.

## 2016-01-25 NOTE — Transfer of Care (Signed)
Immediate Anesthesia Transfer of Care Note  Patient: ONA TELLEFSEN  Procedure(s) Performed: Procedure(s): LAPAROSCOPIC BILATERAL SALPINGECTOMY, ADHESIOLYSIS, LAPAROSCOPIC EXCISION/FULGERATION ENDOMETRIOSIS  (N/A)  Patient Location: PACU  Anesthesia Type:General  Level of Consciousness: awake, alert  and oriented  Airway & Oxygen Therapy: Patient Spontanous Breathing and Patient connected to nasal cannula oxygen  Post-op Assessment: Report given to RN and Post -op Vital signs reviewed and stable  Post vital signs: Reviewed and stable  Last Vitals:  Filed Vitals:   01/25/16 0744  BP: 121/62  Pulse: 67  Temp: 36.6 C  Resp: 16    Complications: No apparent anesthesia complications

## 2016-01-25 NOTE — Interval H&P Note (Signed)
History and Physical Interval Note:  01/25/2016 8:26 AM  Jessica Carroll  has presented today for surgery, with the diagnosis of DESIRES STERILIZATION,DYSMENORRHEA,DYSPAREUNIA  The various methods of treatment have been discussed with the patient and family. After consideration of risks, benefits and other options for treatment, the patient has consented to  Procedure(s): LAPAROSCOPIC BILATERAL SALPINGECTOMY (Bilateral) as a surgical intervention .  The patient's history has been reviewed, patient examined, no change in status, stable for surgery.  I have reviewed the patient's chart and labs.  Questions were answered to the patient's satisfaction.     Hassell Done A Dequavious Harshberger

## 2016-01-27 LAB — SURGICAL PATHOLOGY

## 2016-01-28 NOTE — Anesthesia Postprocedure Evaluation (Signed)
Anesthesia Post Note  Patient: Jessica Carroll  Procedure(s) Performed: Procedure(s) (LRB): LAPAROSCOPIC BILATERAL SALPINGECTOMY, ADHESIOLYSIS, LAPAROSCOPIC EXCISION/FULGERATION ENDOMETRIOSIS  (N/A)  Patient location during evaluation: PACU Anesthesia Type: General Level of consciousness: awake and alert Pain management: pain level controlled Vital Signs Assessment: post-procedure vital signs reviewed and stable Respiratory status: spontaneous breathing, nonlabored ventilation, respiratory function stable and patient connected to nasal cannula oxygen Cardiovascular status: blood pressure returned to baseline and stable Postop Assessment: no signs of nausea or vomiting Anesthetic complications: no    Last Vitals:  Filed Vitals:   01/25/16 1106 01/25/16 1114  BP: 102/61 102/59  Pulse: 59 53  Temp: 36.6 C 35.6 C  Resp: 16 136    Last Pain:  Filed Vitals:   01/26/16 1632  PainSc: 0-No pain                 Molli Barrows

## 2016-02-03 ENCOUNTER — Encounter: Payer: Self-pay | Admitting: Obstetrics and Gynecology

## 2016-02-03 ENCOUNTER — Ambulatory Visit (INDEPENDENT_AMBULATORY_CARE_PROVIDER_SITE_OTHER): Payer: Managed Care, Other (non HMO) | Admitting: Obstetrics and Gynecology

## 2016-02-03 VITALS — BP 113/74 | HR 73 | Wt 122.3 lb

## 2016-02-03 DIAGNOSIS — N941 Unspecified dyspareunia: Secondary | ICD-10-CM

## 2016-02-03 DIAGNOSIS — N809 Endometriosis, unspecified: Secondary | ICD-10-CM

## 2016-02-03 DIAGNOSIS — R309 Painful micturition, unspecified: Secondary | ICD-10-CM

## 2016-02-03 DIAGNOSIS — N946 Dysmenorrhea, unspecified: Secondary | ICD-10-CM

## 2016-02-03 DIAGNOSIS — Z09 Encounter for follow-up examination after completed treatment for conditions other than malignant neoplasm: Secondary | ICD-10-CM

## 2016-02-03 LAB — POCT URINALYSIS DIPSTICK
Bilirubin, UA: NEGATIVE
Blood, UA: NEGATIVE
Glucose, UA: NEGATIVE
KETONES UA: NEGATIVE
LEUKOCYTES UA: NEGATIVE
NITRITE UA: NEGATIVE
PH UA: 6
PROTEIN UA: NEGATIVE
Spec Grav, UA: 1.02
UROBILINOGEN UA: NEGATIVE

## 2016-02-03 NOTE — Patient Instructions (Signed)
1.  Resume activities as tolerated. 2.  You may return to work in 5 days. Note for work Is available if desired. 3.  Monitor menstrual cycles. 4.  If pelvic pain persists and periods remained painful and heavy, hysterectomy may be considered due to diagnosis of endometriosis.

## 2016-02-03 NOTE — Progress Notes (Signed)
Chief complaint: 1.  One week postop check. 2.  Status post laparoscopic bilateral salpingectomy and excision and fulguration of endometriosis   Surgical Pathology  CASE: [id number]  PATIENT: Jessica Carroll  Surgical Pathology Report      SPECIMEN SUBMITTED:  A. Bladder flap peritoneum  B. Segment serosa, lower uterine  C. Fallopian tubes, bilateral   CLINICAL HISTORY:  None provided   PRE-OPERATIVE DIAGNOSIS:  Desires sterilization, dysmenorrhea, dyspareunia   POST-OPERATIVE DIAGNOSIS:  Desires sterilization, chronic pelvic pain, endometriosis      DIAGNOSIS:  A. BLADDER FLAP PERITONEUM; BIOPSY:  - ENDOMETRIOSIS.   B. LOWER UTERINE SEGMENT SEROSA; BIOPSY:  - ENDOMETRIOSIS.   C. FALLOPIAN TUBES, BILATERAL; BILATERAL SALPINGECTOMY:  - ONE FALLOPIAN TUBE WITH BENIGN PARATUBAL CYST.  - BOTH FALLOPIAN TUBES WITH FULL CROSS SECTION OF THE LUMEN EXAMINED.  - NEGATIVE FOR ATYPIA AND MALIGNANCY.           Patient notes mild lower abdominal discomfort radiating into her vagina from the bladder.  She did have endometriosis excised from the bladder as well as the uterosacral ligament.  She does have some dysuria.  Bowel function is normal.  OBJECTIVE: BP 113/74 mmHg  Pulse 73  Wt 122 lb 5 oz (55.481 kg)  LMP 12/06/2015 Pleasant white female in no acute distress. Abdomen: Bandage is removed.  Laparoscopy incision sites are well-healed.  Single sutures are present without evidence of inflammation or induration.  No hernia. Pelvic exam: Deferred.  Urinalysis unremarkable. Urine culture is ordered.  ASSESSMENT: 1.  One week status post laparoscopic bilateral salpingectomy and excision and fulguration of endometriosis. 2.  Lower abdominal pain with UTI symptoms with normal urinalysis; cannot rule out UTI. 3.  Symptomatically endometriosis with dysmenorrhea and dyspareunia  PLAN: 1.  Resume activities as tolerated. 2.  Return to work on 02/08/2016. 3.  Return in 3  months for follow-up. 4.  Urine culture to rule out UTI  [name], MD  Note: This dictation was prepared with Dragon dictation along with smaller phrase technology. Any transcriptional errors that result from this process are unintentional.

## 2016-02-05 LAB — URINE CULTURE: Organism ID, Bacteria: NO GROWTH

## 2016-02-09 ENCOUNTER — Ambulatory Visit
Admission: RE | Admit: 2016-02-09 | Discharge: 2016-02-09 | Disposition: A | Discharge status: Home / Self Care | Payer: Managed Care, Other (non HMO) | Source: Ambulatory Visit | Attending: Family Medicine | Admitting: Family Medicine

## 2016-02-09 ENCOUNTER — Other Ambulatory Visit: Payer: Self-pay

## 2016-02-09 DIAGNOSIS — Z1231 Encounter for screening mammogram for malignant neoplasm of breast: Secondary | ICD-10-CM | POA: Diagnosis present

## 2016-02-09 DIAGNOSIS — Z1239 Encounter for other screening for malignant neoplasm of breast: Secondary | ICD-10-CM

## 2016-02-09 MED ORDER — FLUCONAZOLE 150 MG PO TABS: 150.0000 mg | ORAL_TABLET | ORAL | Status: DC

## 2016-02-09 NOTE — Telephone Encounter (Signed)
Patient requesting refill. Due to still having a yeast infection, patient states Dr. Ancil Boozer usually gives her refills on this medication.

## 2016-02-24 ENCOUNTER — Ambulatory Visit (INDEPENDENT_AMBULATORY_CARE_PROVIDER_SITE_OTHER): Payer: Managed Care, Other (non HMO) | Admitting: Family Medicine

## 2016-02-24 ENCOUNTER — Encounter: Payer: Self-pay | Admitting: Family Medicine

## 2016-02-24 VITALS — BP 110/70 | HR 121 | Temp 97.6°F | Resp 16 | Wt 121.8 lb

## 2016-02-24 DIAGNOSIS — R110 Nausea: Secondary | ICD-10-CM | POA: Diagnosis not present

## 2016-02-24 DIAGNOSIS — J111 Influenza due to unidentified influenza virus with other respiratory manifestations: Secondary | ICD-10-CM | POA: Diagnosis not present

## 2016-02-24 DIAGNOSIS — G43009 Migraine without aura, not intractable, without status migrainosus: Secondary | ICD-10-CM

## 2016-02-24 LAB — POCT INFLUENZA A/B
INFLUENZA A, POC: NEGATIVE
INFLUENZA B, POC: NEGATIVE

## 2016-02-24 MED ORDER — OSELTAMIVIR PHOSPHATE 75 MG PO CAPS: 75.0000 mg | ORAL_CAPSULE | Freq: Two times a day (BID) | ORAL | Status: DC

## 2016-02-24 MED ORDER — PROMETHAZINE HCL 25 MG PO TABS: 25.0000 mg | ORAL_TABLET | Freq: Two times a day (BID) | ORAL | Status: DC | PRN

## 2016-02-24 NOTE — Progress Notes (Signed)
Name: Jessica Carroll   MRN: [id number]    DOB: 1971/08/08   Date:02/24/2016       Progress Note  Subjective  Chief Complaint  Chief Complaint  Patient presents with  . URI    HPI  Patient is here today with concerns regarding the following symptoms congestion, non productive cough, achiness, nausea and vomiting and low grade fevers that started days ago.  Associated with fevers, chills, sweats, fatigue, nausea, diarrhea, mild abdominal pain and malaise.  Also has history of migraine headaches and feels that her acute illness has brought on a mild headache but not as bad as her typical headache symptoms. Has tried the following home remedies: tylenol. POSITIVE SICK CONTACT: Boyfriend diagnosed with influenza 2 weeks ago.    Past Medical History  Diagnosis Date  . Diffuse cystic mastopathy   . Asthma 2011  . GERD (gastroesophageal reflux disease)   . Anxiety   . Headache     Social History  Substance Use Topics  . Smoking status: Never Smoker   . Smokeless tobacco: Never Used  . Alcohol Use: 0.0 oz/week    0 Standard drinks or equivalent per week     Comment: occ. wine     Current outpatient prescriptions:  .  albuterol (PROVENTIL HFA;VENTOLIN HFA) 108 (90 BASE) MCG/ACT inhaler, Inhale 2 puffs into the lungs as needed., Disp: , Rfl:  .  ALPRAZolam (XANAX) 0.5 MG tablet, Take 1 tablet (0.5 mg total) by mouth 2 (two) times daily as needed for anxiety. For sleep, Disp: 30 tablet, Rfl: 0 .  cyclobenzaprine (FLEXERIL) 10 MG tablet, Take 1 tablet (10 mg total) by mouth as needed., Disp: 30 tablet, Rfl: 0 .  DEXILANT 60 MG capsule, Take 1 capsule (60 mg total) by mouth daily., Disp: 30 capsule, Rfl: 2 .  fluconazole (DIFLUCAN) 150 MG tablet, Take 1 tablet (150 mg total) by mouth every other day., Disp: 3 tablet, Rfl: 2 .  fluticasone (FLONASE) 50 MCG/ACT nasal spray, Place 2 sprays into the nose as needed., Disp: , Rfl:  .  Fluticasone-Salmeterol (ADVAIR) 250-50 MCG/DOSE AEPB,  Inhale 2 puffs into the lungs 2 (two) times daily., Disp: 60 each, Rfl: 5 .  ibuprofen (ADVIL,MOTRIN) 800 MG tablet, Take 1 tablet by mouth 3 (three) times daily as needed., Disp: , Rfl:  .  ibuprofen (ADVIL,MOTRIN) 800 MG tablet, Take 1 tablet (800 mg total) by mouth 3 (three) times daily., Disp: 30 tablet, Rfl: 1 .  MULTIPLE VITAMIN PO, Take 1 tablet by mouth daily., Disp: , Rfl:  .  oxyCODONE-acetaminophen (ROXICET) 5-325 MG tablet, Take 1-2 tablets by mouth every 4 (four) hours as needed for moderate pain or severe pain., Disp: 30 tablet, Rfl: 0 .  prednisoLONE acetate (PRED FORTE) 1 % ophthalmic suspension, Administer 1 drop to the right eye daily., Disp: , Rfl:  .  PROCTOFOAM HC rectal foam, Place rectally 3 (three) times daily., Disp: 10 g, Rfl: 0 .  promethazine (PHENERGAN) 25 MG tablet, Take 1 tablet (25 mg total) by mouth 2 (two) times daily as needed for nausea., Disp: 30 tablet, Rfl: 1 .  ranitidine (ZANTAC) 300 MG capsule, Take 1 capsule (300 mg total) by mouth every evening., Disp: 30 capsule, Rfl: 0 .  rizatriptan (MAXALT) 10 MG tablet, Take 1 tablet (10 mg total) by mouth 2 (two) times daily as needed., Disp: 10 tablet, Rfl: 2 .  tacrolimus (PROTOPIC) 0.03 % ointment, Apply topically., Disp: , Rfl:  .  triamcinolone cream (KENALOG)  0.1 %, 1 application as needed., Disp: , Rfl:  .  valACYclovir (VALTREX) 500 MG tablet, Take 1 tablet by mouth 2 (two) times daily., Disp: , Rfl: 0 .  zolpidem (AMBIEN) 5 MG tablet, Take 1 tablet (5 mg total) by mouth at bedtime as needed for sleep., Disp: 15 tablet, Rfl: 1  Allergies  Allergen Reactions  . Penicillins Nausea Only    ROS  Positive for fatigue, nasal congestion, myalgia, fevers, ear fullness, abdominal pain, diarrhea as mentioned in HPI, otherwise all systems reviewed and are negative.  Objective  Filed Vitals:   02/24/16 0907  BP: 110/70  Pulse: 121  Temp: 97.6 F (36.4 C)  TempSrc: Oral  Resp: 16  Weight: 121 lb 12.8 oz  (55.248 kg)  SpO2: 98%   Body mass index is 20.27 kg/(m^2).   Physical Exam  Constitutional: Patient appears well-developed and well-nourished. In no acute distress. HEENT:  - Head: Normocephalic and atraumatic.  - Ears: Right and Left TM pearly grey without retractions or erythema.   - Nose: Nasal mucosa boggy and congested.  - Mouth/Throat: Oropharynx is moist with slight erythema of bilateral tonsils without hypertrophy or exudates. Post nasal drainage present.  - Eyes: Conjunctivae clear, EOM movements normal. PERRLA. No scleral icterus.  Neck: Normal range of motion. Neck supple. No JVD present. No thyromegaly present. No local lymphadenopathy. Cardiovascular: Regular rate, regular rhythm with no murmurs heard.  Pulmonary/Chest: Effort normal and breath sounds clear in all lung fields.  Abdomen: Soft, ND, normal BS, no guarding or rebound but she does report mild tenderness to palpation over epigastric area.  Musculoskeletal: Normal range of motion bilateral UE and LE, no joint effusions. Skin: Skin is warm and dry. No rash noted. Psychiatric: Patient has a normal mood and affect. Behavior is normal in office today. Judgment and thought content normal in office today.  Results for orders placed or performed in visit on 02/24/16 (from the past 24 hour(s))  POCT Influenza A/B     Status: Normal   Collection Time: 02/24/16  9:08 AM  Result Value Ref Range   Influenza A, POC Negative Negative   Influenza B, POC Negative Negative    Assessment & Plan  1. Influenza caused by unspecified influenza virus Rapid flu testing accuracy 50-70% and due to positive exposure history and clinical symptoms of Influenza I am diagnosing her with the seasonal flu. I have however expressed to patient that tamiflu would likely not be as effective for treatment due to exposure duration and symptoms duration. She opted to still take tamiflu along with supportive therapy as discussed in office.   -  POCT Influenza A/B - oseltamivir (TAMIFLU) 75 MG capsule; Take 1 capsule (75 mg total) by mouth 2 (two) times daily.  Dispense: 10 capsule; Refill: 0  2. Nausea Keep well hydrated.  - promethazine (PHENERGAN) 25 MG tablet; Take 1 tablet (25 mg total) by mouth 2 (two) times daily as needed for nausea.  Dispense: 30 tablet; Refill: 1  4. Migraine without aura and without status migrainosus, not intractable Stable  - promethazine (PHENERGAN) 25 MG tablet; Take 1 tablet (25 mg total) by mouth 2 (two) times daily as needed for nausea.  Dispense: 30 tablet; Refill: 1

## 2016-02-24 NOTE — Patient Instructions (Signed)

## 2016-03-08 ENCOUNTER — Encounter: Payer: Self-pay | Admitting: Obstetrics and Gynecology

## 2016-03-17 ENCOUNTER — Encounter: Payer: Self-pay | Admitting: Family Medicine

## 2016-03-17 ENCOUNTER — Ambulatory Visit (INDEPENDENT_AMBULATORY_CARE_PROVIDER_SITE_OTHER): Payer: 59 | Admitting: Family Medicine

## 2016-03-17 VITALS — BP 106/68 | HR 74 | Temp 98.1°F | Resp 16 | Ht 65.0 in | Wt 124.6 lb

## 2016-03-17 DIAGNOSIS — G4700 Insomnia, unspecified: Secondary | ICD-10-CM

## 2016-03-17 DIAGNOSIS — M545 Low back pain, unspecified: Secondary | ICD-10-CM

## 2016-03-17 DIAGNOSIS — K219 Gastro-esophageal reflux disease without esophagitis: Secondary | ICD-10-CM

## 2016-03-17 DIAGNOSIS — K5909 Other constipation: Secondary | ICD-10-CM

## 2016-03-17 DIAGNOSIS — G43009 Migraine without aura, not intractable, without status migrainosus: Secondary | ICD-10-CM | POA: Diagnosis not present

## 2016-03-17 DIAGNOSIS — N809 Endometriosis, unspecified: Secondary | ICD-10-CM

## 2016-03-17 DIAGNOSIS — B3731 Acute candidiasis of vulva and vagina: Secondary | ICD-10-CM

## 2016-03-17 DIAGNOSIS — F419 Anxiety disorder, unspecified: Secondary | ICD-10-CM | POA: Diagnosis not present

## 2016-03-17 DIAGNOSIS — D519 Vitamin B12 deficiency anemia, unspecified: Secondary | ICD-10-CM | POA: Diagnosis not present

## 2016-03-17 DIAGNOSIS — K5900 Constipation, unspecified: Secondary | ICD-10-CM | POA: Diagnosis not present

## 2016-03-17 DIAGNOSIS — J45998 Other asthma: Secondary | ICD-10-CM

## 2016-03-17 DIAGNOSIS — B373 Candidiasis of vulva and vagina: Secondary | ICD-10-CM

## 2016-03-17 DIAGNOSIS — J45909 Unspecified asthma, uncomplicated: Secondary | ICD-10-CM

## 2016-03-17 MED ORDER — FLUCONAZOLE 150 MG PO TABS: 150.0000 mg | ORAL_TABLET | ORAL | Status: DC

## 2016-03-17 MED ORDER — ALPRAZOLAM 0.5 MG PO TABS: 0.5000 mg | ORAL_TABLET | Freq: Two times a day (BID) | ORAL | Status: DC | PRN

## 2016-03-17 MED ORDER — CYANOCOBALAMIN 1000 MCG/ML IJ SOLN
1000.0000 ug | Freq: Once | INTRAMUSCULAR | Status: AC
  Administered 2016-03-17: 1000 ug via INTRAMUSCULAR

## 2016-03-17 MED ORDER — DEXILANT 60 MG PO CPDR: 60.0000 mg | DELAYED_RELEASE_CAPSULE | Freq: Every day | ORAL | Status: DC

## 2016-03-17 MED ORDER — ZOLPIDEM TARTRATE 5 MG PO TABS: 5.0000 mg | ORAL_TABLET | Freq: Every evening | ORAL | Status: DC | PRN

## 2016-03-17 MED ORDER — IBUPROFEN 800 MG PO TABS: 800.0000 mg | ORAL_TABLET | Freq: Three times a day (TID) | ORAL | Status: DC | PRN

## 2016-03-17 MED ORDER — MONTELUKAST SODIUM 10 MG PO TABS: 10.0000 mg | ORAL_TABLET | Freq: Every day | ORAL | Status: DC

## 2016-03-17 MED ORDER — ALBUTEROL SULFATE HFA 108 (90 BASE) MCG/ACT IN AERS: 2.0000 | INHALATION_SPRAY | RESPIRATORY_TRACT | Status: DC | PRN

## 2016-03-17 NOTE — Progress Notes (Signed)
Name: Jessica Carroll   MRN: [id number]    DOB: 02/21/1971   Date:03/17/2016       Progress Note  Subjective  Chief Complaint  Chief Complaint  Patient presents with  . Anxiety    patient is here for her 59-month f/u  . Insomnia  . Asthma  . Gastroesophageal Reflux  . Migraine  . Anemia    B12 deficiency  . Constipation    chronic    HPI  Constipation: she used to take Linzess but has been off medication,  no straining, she has been eating more fiber , she states had a flare after tubal ligation because of pain mediation, but is back to normal now  Migraine: she has episodes without aura, about once or twice monthly, Maxalt or Ibuprofen usually improves the symptoms within a few hours. She has photophobia and phonophobia, she also has nausea at times. Headache is described as throbbing and usually frontal and radiates to nuchal area.  B12 deficiency: she stopped otc B12 and has not been back for shots in a while  Insomnia/Anxiety: she was doing well on Alprazolam, but she has been stressed since Christmas, boyfriend of 12 years broke up with her and she has not been sleeping well, she moved back in with him four days ago, they will go to counseling together. She is doing well now, relationship is back to normal.   Endometriosis: found by gyn during laparoscopic surgery for tubal ligation, states cycles have been very heavy since procedure, but she used to take ocp before.   Intermittent low back : symptoms are sporadic, takes ibuprofen and or flexeril prn with control of symptoms. , usually lower back and sometimes radiates to mid back, worse with lifting at work.   Asthma Moderate: she used Advair daily, she still has intermittent chest tightness, no SOB but has noticed increase in dry cough over the past few days,  she has not tried rescue inhaler, symptoms worse in the Spring we will add Singulair.    Patient Active Problem List   Diagnosis Date Noted  . Endometriosis  03/17/2016  . Nausea in adult patient 02/24/2016  . Endometriosis determined by laparoscopy 01/25/2016  . Dysmenorrhea 12/30/2015  . Dyspareunia, female 12/30/2015  . Chronic constipation 08/14/2015  . Yeast vaginitis 08/14/2015  . Bilateral ganglion cysts of wrists 08/14/2015  . Mild mitral regurgitation 05/19/2015  . B12 deficiency 05/18/2015  . Asthma, moderate 05/18/2015  . Anxiety 05/18/2015  . Insomnia, persistent 05/18/2015  . Eczema of hand 05/18/2015  . Viral keratitis 05/18/2015  . Migraine without aura and without status migrainosus, not intractable 05/18/2015  . Numerous moles 05/18/2015  . Allergic rhinitis 05/18/2015  . History of corneal transplant 05/18/2015  . Gastro-esophageal reflux disease without esophagitis 12/16/2009    Past Surgical History  Procedure Laterality Date  . Upper gi endoscopy  07/19/2012  . Corneal transplant Right 01/07/2013  . Eyelid surgery Right June 19, 2015    [facility] by Dr. Norlene Duel  . Laparoscopic excision and fulguration of endometriosis N/A     bladder flap and uterosacral ligament endometriosis, excised and cauterized  . Laparoscopic bilateral salpingectomy N/A 01/25/2016    Procedure: LAPAROSCOPIC BILATERAL SALPINGECTOMY, ADHESIOLYSIS, LAPAROSCOPIC EXCISION/FULGERATION ENDOMETRIOSIS ;  Surgeon: [name], MD;  Location: [facility] ORS;  Service: Gynecology;  Laterality: N/A;    Family History  Problem Relation Age of Onset  . Adopted: Yes  . Hyperlipidemia Mother   . Hypertension Mother   .  Diabetes Father   . Heart failure Father 51    hear attack  . Cancer Neg Hx     Social History   Social History  . Marital Status: Single    Spouse Name: N/A  . Number of Children: N/A  . Years of Education: 12   Occupational History  . textile    Social History Main Topics  . Smoking status: Never Smoker   . Smokeless tobacco: Never Used  . Alcohol Use: 0.0 oz/week    0 Standard drinks or equivalent per week      Comment: occ. wine  . Drug Use: No  . Sexual Activity:    Partners: Male    Birth Control/ Protection: None, Surgical   Other Topics Concern  . Not on file   Social History Narrative     Current outpatient prescriptions:  .  albuterol (PROVENTIL HFA;VENTOLIN HFA) 108 (90 Base) MCG/ACT inhaler, Inhale 2 puffs into the lungs as needed., Disp: 1 Inhaler, Rfl: 0 .  ALPRAZolam (XANAX) 0.5 MG tablet, Take 1 tablet (0.5 mg total) by mouth 2 (two) times daily as needed for anxiety. For sleep, Disp: 30 tablet, Rfl: 0 .  cyclobenzaprine (FLEXERIL) 10 MG tablet, Take 1 tablet (10 mg total) by mouth as needed., Disp: 30 tablet, Rfl: 0 .  DEXILANT 60 MG capsule, Take 1 capsule (60 mg total) by mouth daily., Disp: 30 capsule, Rfl: 2 .  fluconazole (DIFLUCAN) 150 MG tablet, Take 1 tablet (150 mg total) by mouth every other day., Disp: 3 tablet, Rfl: 2 .  fluticasone (FLONASE) 50 MCG/ACT nasal spray, Place 2 sprays into the nose as needed., Disp: , Rfl:  .  Fluticasone-Salmeterol (ADVAIR) 250-50 MCG/DOSE AEPB, Inhale 2 puffs into the lungs 2 (two) times daily., Disp: 60 each, Rfl: 5 .  ibuprofen (ADVIL,MOTRIN) 800 MG tablet, Take 1 tablet (800 mg total) by mouth 3 (three) times daily as needed., Disp: 90 tablet, Rfl: 0 .  MULTIPLE VITAMIN PO, Take 1 tablet by mouth daily., Disp: , Rfl:  .  prednisoLONE acetate (PRED FORTE) 1 % ophthalmic suspension, Administer 1 drop to the right eye daily., Disp: , Rfl:  .  PROCTOFOAM HC rectal foam, Place rectally 3 (three) times daily., Disp: 10 g, Rfl: 0 .  ranitidine (ZANTAC) 300 MG capsule, Take 1 capsule (300 mg total) by mouth every evening., Disp: 30 capsule, Rfl: 0 .  rizatriptan (MAXALT) 10 MG tablet, Take 1 tablet (10 mg total) by mouth 2 (two) times daily as needed., Disp: 10 tablet, Rfl: 2 .  triamcinolone cream (KENALOG) 0.1 %, 1 application as needed., Disp: , Rfl:  .  valACYclovir (VALTREX) 500 MG tablet, Take 1 tablet by mouth 2 (two) times daily.,  Disp: , Rfl: 0 .  zolpidem (AMBIEN) 5 MG tablet, Take 1 tablet (5 mg total) by mouth at bedtime as needed for sleep., Disp: 30 tablet, Rfl: 2  Allergies  Allergen Reactions  . Penicillins Nausea Only     ROS  Ten systems reviewed and is negative except as mentioned in HPI   Objective  Filed Vitals:   03/17/16 1607  BP: 106/68  Pulse: 74  Temp: 98.1 F (36.7 C)  TempSrc: Oral  Resp: 16  Height: 5\' 5"  (1.651 m)  Weight: 124 lb 9.6 oz (56.518 kg)  SpO2: 98%    Body mass index is 20.73 kg/(m^2).  Physical Exam  Constitutional: Patient appears well-developed and well-nourished.  No distress.  HEENT: head atraumatic, normocephalic, pupils equal  and reactive to light, neck supple, throat within normal limits Cardiovascular: Normal rate, regular rhythm and normal heart sounds.  No murmur heard. No BLE edema. Pulmonary/Chest: Effort normal and breath sounds normal. No respiratory distress. Abdominal: Soft.  There is no tenderness. Psychiatric: Patient has a normal mood and affect. behavior is normal. Judgment and thought content normal.  Recent Results (from the past 2160 hour(s))  CBC WITH DIFFERENTIAL     Status: Abnormal   Collection Time: 01/20/16 11:41 AM  Result Value Ref Range   WBC 5.3 3.6 - 11.0 K/uL   RBC 3.59 (L) 3.80 - 5.20 MIL/uL   Hemoglobin 11.2 (L) 12.0 - 16.0 g/dL   HCT 34.1 (L) 35.0 - 47.0 %   MCV 94.8 80.0 - 100.0 fL   MCH 31.1 26.0 - 34.0 pg   MCHC 32.8 32.0 - 36.0 g/dL   RDW 12.6 11.5 - 14.5 %   Platelets 191 150 - 440 K/uL   Neutrophils Relative % 54 %   Neutro Abs 2.8 1.4 - 6.5 K/uL   Lymphocytes Relative 37 %   Lymphs Abs 2.0 1.0 - 3.6 K/uL   Monocytes Relative 6 %   Monocytes Absolute 0.3 0.2 - 0.9 K/uL   Eosinophils Relative 2 %   Eosinophils Absolute 0.1 0 - 0.7 K/uL   Basophils Relative 1 %   Basophils Absolute 0.1 0 - 0.1 K/uL  Rapid HIV screen (HIV 1/2 Ab+Ag)     Status: None   Collection Time: 01/20/16 11:41 AM  Result Value Ref  Range   HIV-1 P24 Antigen - HIV24 NON REACTIVE NON REACTIVE   HIV 1/2 Antibodies NON REACTIVE NON REACTIVE   Interpretation (HIV Ag Ab)      A non reactive test result means that HIV 1 or HIV 2 antibodies and HIV 1 p24 antigen were not detected in the specimen.  RPR     Status: None   Collection Time: 01/20/16 11:41 AM  Result Value Ref Range   RPR Ser Ql Non Reactive Non Reactive    Comment: (NOTE) Performed At: [facility] [place], [place] [id number] Lindon Romp MD [id number]   Pregnancy, urine POC     Status: None   Collection Time: 01/25/16  7:45 AM  Result Value Ref Range   Preg Test, Ur NEGATIVE NEGATIVE    Comment:        THE SENSITIVITY OF THIS METHODOLOGY IS >24 mIU/mL   Surgical pathology     Status: None   Collection Time: 01/25/16  9:41 AM  Result Value Ref Range   SURGICAL PATHOLOGY      Surgical Pathology CASE: [id number] PATIENT: Sheva Pilat Surgical Pathology Report     SPECIMEN SUBMITTED: A. Bladder flap peritoneum B. Segment serosa, lower uterine C. Fallopian tubes, bilateral  CLINICAL HISTORY: None provided  PRE-OPERATIVE DIAGNOSIS: Desires sterilization, dysmenorrhea, dyspareunia  POST-OPERATIVE DIAGNOSIS: Desires sterilization, chronic pelvic pain, endometriosis     DIAGNOSIS: A. BLADDER FLAP PERITONEUM; BIOPSY: - ENDOMETRIOSIS.  B. LOWER UTERINE SEGMENT SEROSA; BIOPSY: - ENDOMETRIOSIS.  C. FALLOPIAN TUBES, BILATERAL; BILATERAL SALPINGECTOMY: - ONE FALLOPIAN TUBE WITH BENIGN PARATUBAL CYST. - BOTH FALLOPIAN TUBES WITH FULL CROSS SECTION OF THE LUMEN EXAMINED. - NEGATIVE FOR ATYPIA AND MALIGNANCY.   GROSS DESCRIPTION:  A. Labeled: bladder flap peritoneum  Tissue fragment(s): 2  Size: aggregate, 0.5 x 0.2 x 0.1 cm  Description: pink-tan  Entirely submitted in one cassette(s).   B. Labeled: lower uterine seg ment serosa  Tissue  fragment(s): 1  Size: 0.3 x 0.1 x 0.1  cm  Description: tan fragment  Entirely submitted in one cassette(s).   C. Labeled: bilateral fallopian tubes  Size: 2 tubular fragments, 5.6 x 1.0 x 1.1 cm and 4.3 x 1.0 x 1.0 cm and unattached 1.5 x 0.5 x 0.1 cm fibrous fragment  Other findings: both fallopian tubes are fimbriated the smooth purple serosa, the first described has a 0.5 cm paratubal cyst  Block summary: 1- representative cross-sections first described fallopian tube with paratubal cyst and longitudinal fimbriated end 2-central cross-sections second described fallopian tube and longitudinal fimbriated end and fibrous fragment  Final Diagnosis performed by Quay Burow, MD.  Electronically signed 01/27/2016 9:31:44AM    The electronic signature indicates that the named Attending Pathologist has evaluated the specimen  Technical component performed at [place], [address], [place], [place] [address] Lab: [phone] Dir: Darrick Penna.  Evette Doffing, MD  Professional component performed at [facility], [facility], [place], [place], [place] [address] Lab: [phone] Dir: Dellia Nims. Rubinas, MD    POCT urinalysis dipstick     Status: None   Collection Time: 02/03/16  7:59 AM  Result Value Ref Range   Color, UA yellow    Clarity, UA clear    Glucose, UA negative    Bilirubin, UA negative    Ketones, UA negative    Spec Grav, UA 1.020    Blood, UA negative    pH, UA 6.0    Protein, UA negative    Urobilinogen, UA negative    Nitrite, UA negative    Leukocytes, UA Negative Negative  Urine culture     Status: None   Collection Time: 02/03/16  3:21 PM  Result Value Ref Range   Urine Culture, Routine Final report    Urine Culture result 1 No growth   POCT Influenza A/B     Status: Normal   Collection Time: 02/24/16  9:08 AM  Result Value Ref Range   Influenza A, POC Negative Negative   Influenza B, POC Negative Negative     PHQ2/9: Depression screen [facility] 2/9 03/17/2016 08/14/2015  05/13/2015  Decreased Interest 0 0 2  Down, Depressed, Hopeless 0 0 1  PHQ - 2 Score 0 0 3  Altered sleeping - - 1  Tired, decreased energy - - 3  Change in appetite - - 2  Feeling bad or failure about yourself  - - 0  Trouble concentrating - - 0  Moving slowly or fidgety/restless - - 1  Suicidal thoughts - - 0  PHQ-9 Score - - 10  Difficult doing work/chores - - Somewhat difficult     Fall Risk: Fall Risk  03/17/2016 12/17/2015 08/14/2015 05/13/2015  Falls in the past year? No No No No  Risk for fall due to : - - - Impaired vision    Functional Status Survey: Is the patient deaf or have difficulty hearing?: No Does the patient have difficulty seeing, even when wearing glasses/contacts?: No Does the patient have difficulty concentrating, remembering, or making decisions?: No Does the patient have difficulty walking or climbing stairs?: No Does the patient have difficulty dressing or bathing?: No Does the patient have difficulty doing errands alone such as visiting a doctor's office or shopping?: No    Assessment & Plan  1. Migraine without aura and without status migrainosus, not intractable  Taking ibuprofen prn   2. Insomnia, persistent   - zolpidem (AMBIEN) 5 MG tablet; Take 1 tablet (5 mg  total) by mouth at bedtime as needed for sleep.  Dispense: 30 tablet; Refill: 2  3. Chronic constipation  improved   4. B12 deficiency anemia  - cyanocobalamin ((VITAMIN B-12)) injection 1,000 mcg; Inject 1 mL (1,000 mcg total) into the muscle once.  5. Asthma, moderate  - albuterol (PROVENTIL HFA;VENTOLIN HFA) 108 (90 Base) MCG/ACT inhaler; Inhale 2 puffs into the lungs as needed.  Dispense: 1 Inhaler; Refill: 0 - montelukast (SINGULAIR) 10 MG tablet; Take 1 tablet (10 mg total) by mouth at bedtime.  Dispense: 30 tablet; Refill: 3  6. Gastro-esophageal reflux disease without esophagitis  - DEXILANT 60 MG capsule; Take 1 capsule (60 mg total) by mouth daily.  Dispense: 30  capsule; Refill: 2  7. Anxiety  - ALPRAZolam (XANAX) 0.5 MG tablet; Take 1 tablet (0.5 mg total) by mouth 2 (two) times daily as needed for anxiety. For sleep  Dispense: 30 tablet; Refill: 0  8. Yeast vaginitis  - fluconazole (DIFLUCAN) 150 MG tablet; Take 1 tablet (150 mg total) by mouth every other day.  Dispense: 3 tablet; Refill: 2  9. Intermittent low back pain  - ibuprofen (ADVIL,MOTRIN) 800 MG tablet; Take 1 tablet (800 mg total) by mouth 3 (three) times daily as needed.  Dispense: 90 tablet; Refill: 0  10. Endometriosis  Continue follow up with Dr. Enzo Bi

## 2016-03-18 ENCOUNTER — Telehealth: Payer: Self-pay

## 2016-03-18 NOTE — Telephone Encounter (Signed)
Hala, [facility] pharmacist, called the nurse line wanting clarity of this patient's rx for Proventil inhaler. She spoke with Dr. Miguel Aschoff (on-call provider) and he confirmed that it should be used as 1-2 puff every 4 hours as needed.

## 2016-03-18 NOTE — Telephone Encounter (Signed)
The previous message was referenced to the nurse on call line from 03/17/16 @ 7:39pm

## 2016-03-28 ENCOUNTER — Telehealth: Payer: Self-pay | Admitting: Family Medicine

## 2016-03-28 MED ORDER — PROCTOFOAM HC 1-1 % RE FOAM: Freq: Three times a day (TID) | RECTAL | Status: DC

## 2016-03-28 NOTE — Telephone Encounter (Signed)
Requesting refill on Proctofoam. Please send to [facility].

## 2016-05-03 ENCOUNTER — Ambulatory Visit: Payer: Managed Care, Other (non HMO) | Admitting: Obstetrics and Gynecology

## 2016-05-07 IMAGING — CT CT ABD-PELV W/O CM
2 of 4 series · 16 of 46 positions shown, 18 images · non-contrast
Comparison: Right upper quadrant ultrasound 04/09/2014. [REDACTED] report of CT Abdomen and Pelvis 05/07/2006 (no
images available).

CLINICAL DATA: 43-year-old female with mid epigastric pain
radiating to the left upper quadrant and flank for 1 week with
Dysuria. Initial encounter. Query kidney stone.

EXAM:
CT ABDOMEN AND PELVIS WITHOUT CONTRAST
TECHNIQUE: Multidetector CT imaging of the abdomen and pelvis was performed
following the standard protocol without IV contrast.

[Series 2: routine without · axial · non-contrast · 0.62mm/px · z∈[-957,-592]mm · 13 of 81 slices shown, 15 images]
[im 4/81  soft-tissue]
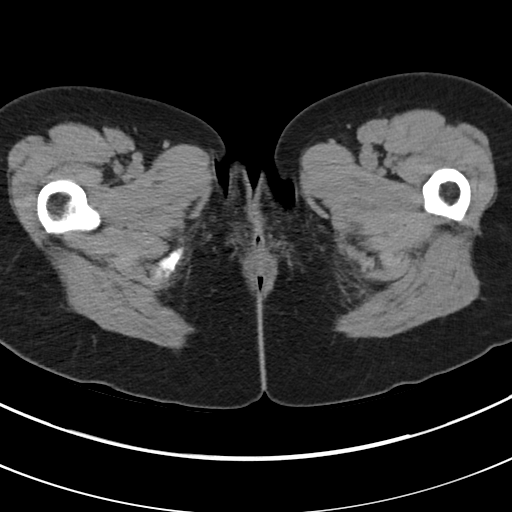
[im 4/81  bone]
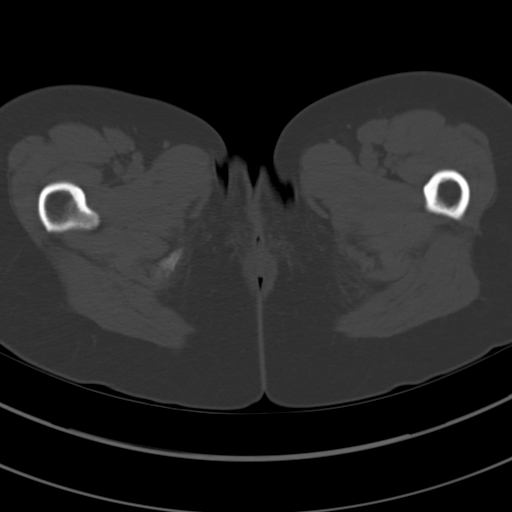
[im 10/81  soft-tissue]
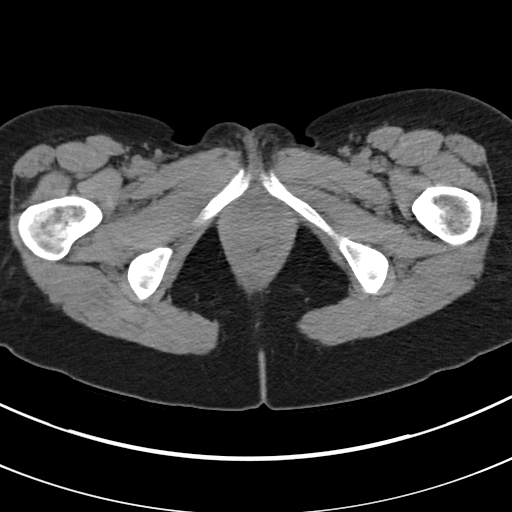
[im 17/81  soft-tissue]
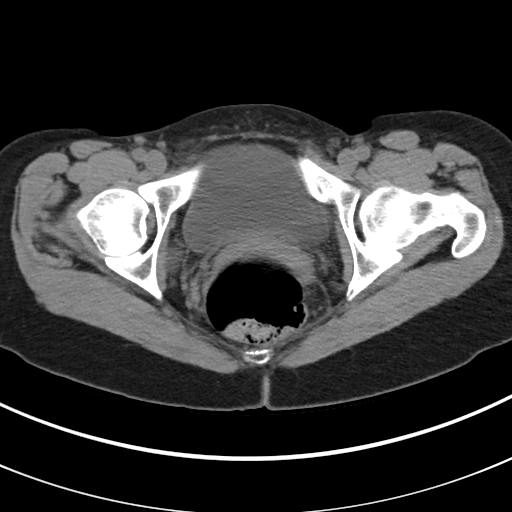
[im 23/81  soft-tissue]
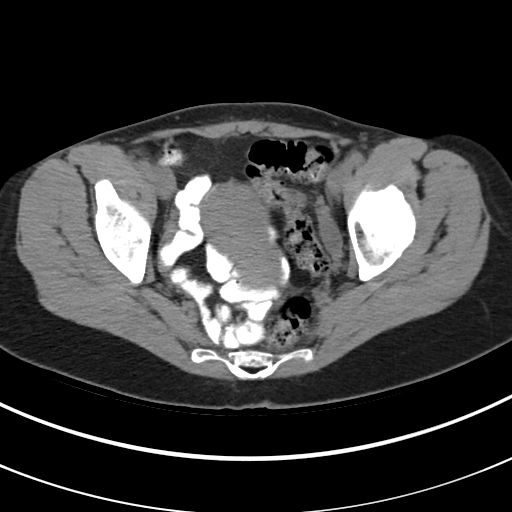
[im 29/81  soft-tissue]
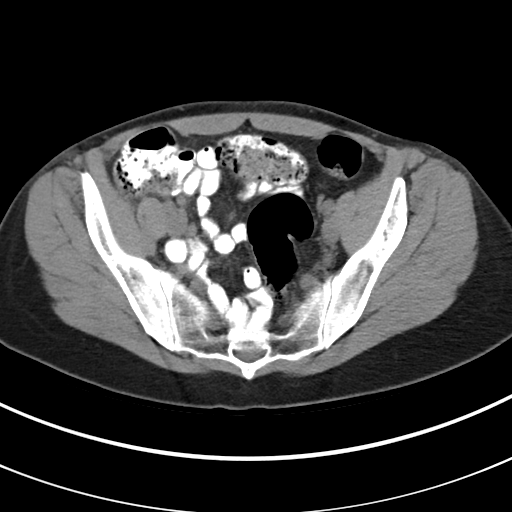
[im 36/81  soft-tissue]
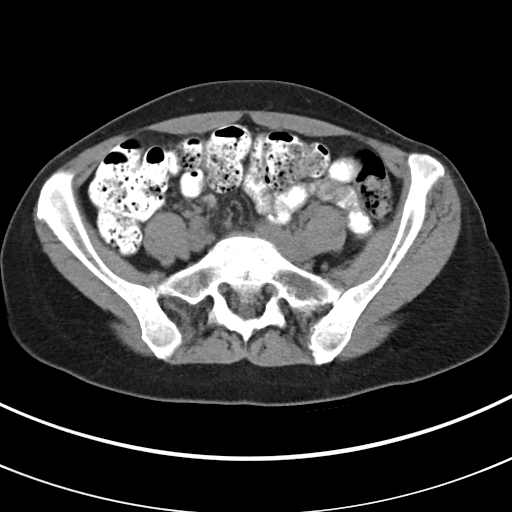
[im 42/81  soft-tissue]
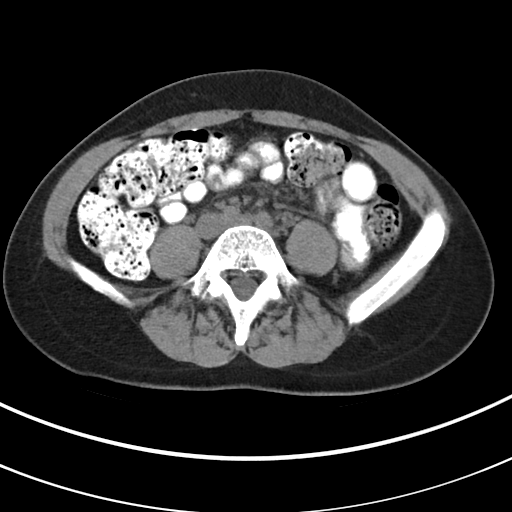
[im 45/81  soft-tissue]
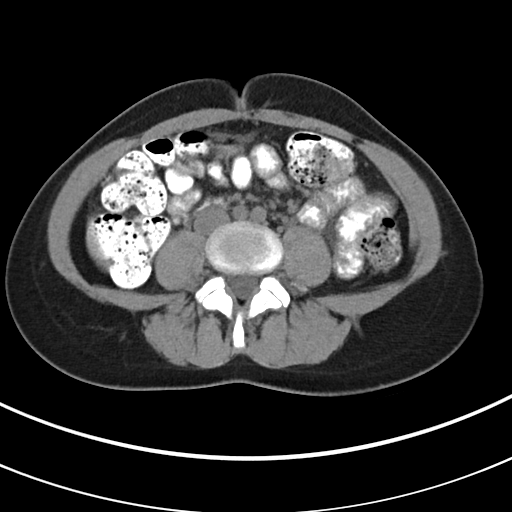
[im 52/81  soft-tissue]
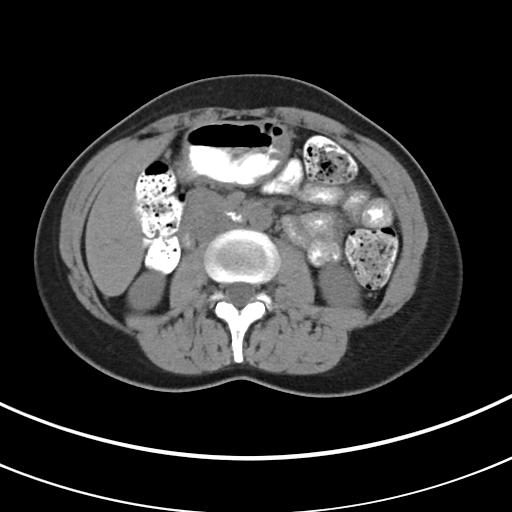
[im 52/81  bone]
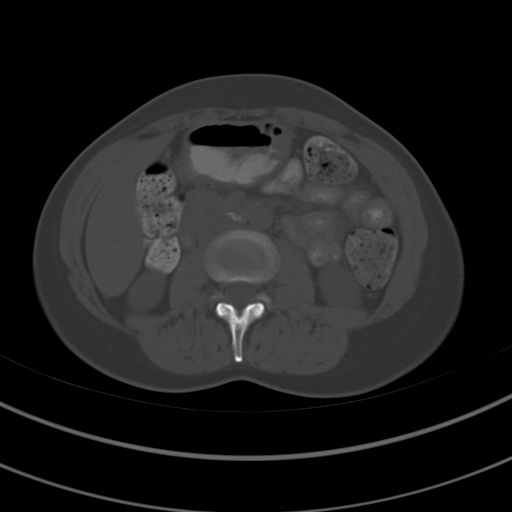
[im 58/81  soft-tissue]
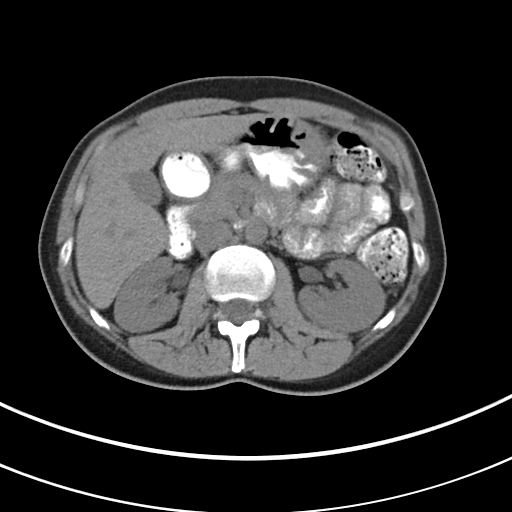
[im 65/81  soft-tissue]
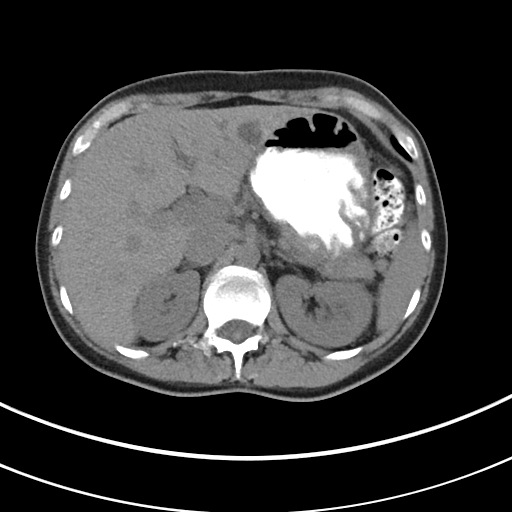
[im 71/81  soft-tissue]
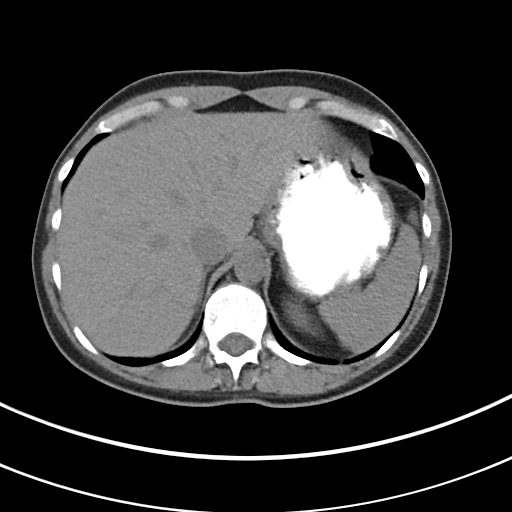
[im 77/81  soft-tissue]
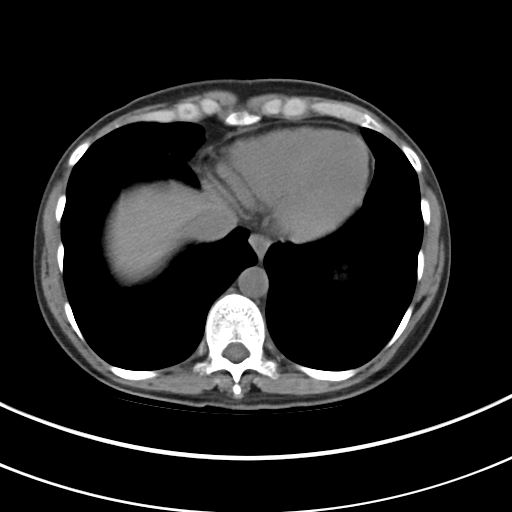

[Series 5: cor routine without · coronal · non-contrast · 0.66mm/px · 3 of 109 slices shown]
[im 37/109  soft-tissue]
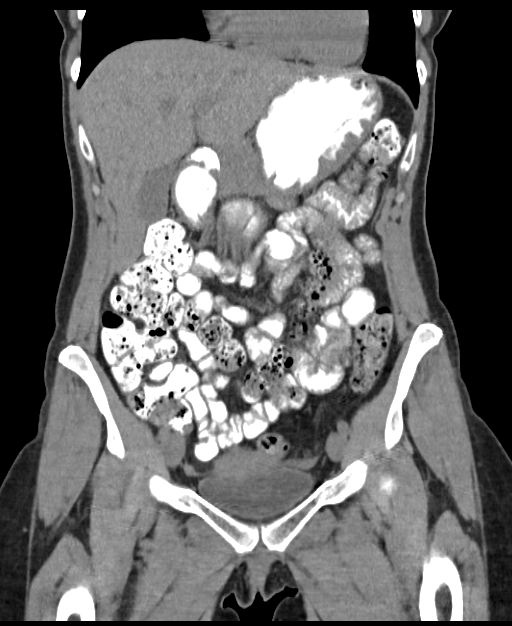
[im 49/109  soft-tissue]
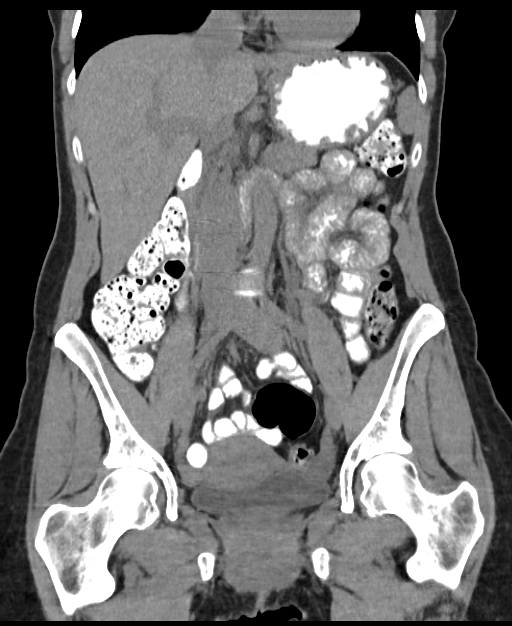
[im 61/109  soft-tissue]
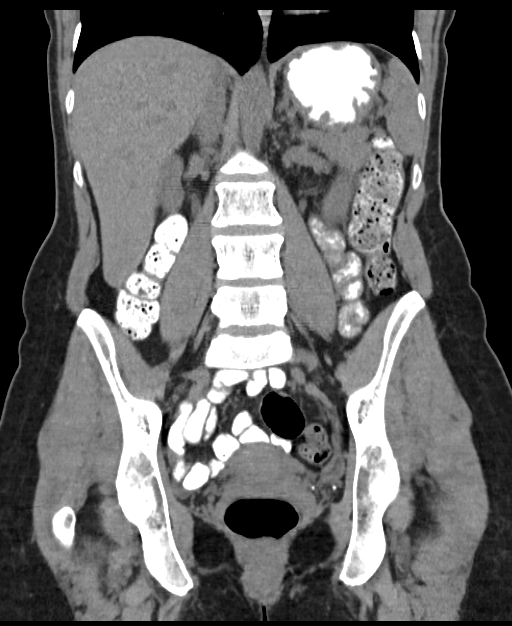

[16 of 46 positions shown; findings below may reference images not displayed]

FINDINGS: Borderline to mild cardiomegaly. No pericardial or pleural effusion.
Negative lung bases.

There is a degree of osteopenia for age. No acute osseous
abnormality identified.

No pelvic free fluid. Gas and stool mildly distending the rectum.
Negative non contrast uterus and adnexa. Redundant but otherwise
negative sigmoid colon.

Oral contrast was administered and has reached the splenic flexure.
Retained stool throughout much of the colon. No colonic
inflammation. The appendix does not contain air or contrast and
measures at the upper limits of normal. No definite periappendiceal
inflammatory stranding. Negative terminal ileum. Multiple nondilated
small bowel loops in the pelvis. No dilated small bowel. Negative
stomach and duodenum; there may be a small diverticulum of the
second portion of the duodenum.

Negative non contrast liver, gallbladder, spleen, pancreas and
adrenal glands. No abdominal free fluid.

No right nephrolithiasis or perinephric stranding. No right
hydronephrosis. The proximal right ureter appears within normal
limits. No right ureteral calculus identified. Unremarkable bladder.

No left nephrolithiasis, hydronephrosis or perinephric stranding.
Proximal left ureter appears within normal limits. Left hemipelvis
phleboliths suspected. No definite left ureteral calculus. No
lymphadenopathy identified.
IMPRESSION: 1. No urologic calculus or obstructive uropathy identified.
2. No acute or inflammatory process identified in the abdomen or
pelvis.

## 2016-05-19 ENCOUNTER — Encounter: Payer: Self-pay | Admitting: Obstetrics and Gynecology

## 2016-05-19 ENCOUNTER — Ambulatory Visit (INDEPENDENT_AMBULATORY_CARE_PROVIDER_SITE_OTHER): Payer: 59 | Admitting: Obstetrics and Gynecology

## 2016-05-19 ENCOUNTER — Ambulatory Visit: Payer: 59 | Admitting: Obstetrics and Gynecology

## 2016-05-19 VITALS — BP 103/63 | HR 76 | Ht 66.0 in | Wt 125.8 lb

## 2016-05-19 DIAGNOSIS — N941 Unspecified dyspareunia: Secondary | ICD-10-CM | POA: Diagnosis not present

## 2016-05-19 DIAGNOSIS — N946 Dysmenorrhea, unspecified: Secondary | ICD-10-CM | POA: Diagnosis not present

## 2016-05-19 DIAGNOSIS — R300 Dysuria: Secondary | ICD-10-CM | POA: Diagnosis not present

## 2016-05-19 DIAGNOSIS — N926 Irregular menstruation, unspecified: Secondary | ICD-10-CM

## 2016-05-19 DIAGNOSIS — N809 Endometriosis, unspecified: Secondary | ICD-10-CM

## 2016-05-19 LAB — POCT URINALYSIS DIPSTICK
Bilirubin, UA: NEGATIVE
Glucose, UA: NEGATIVE
Ketones, UA: NEGATIVE
LEUKOCYTES UA: NEGATIVE
NITRITE UA: NEGATIVE
PH UA: 8
PROTEIN UA: NEGATIVE
RBC UA: NEGATIVE
Spec Grav, UA: <=1.005
UROBILINOGEN UA: 0.2

## 2016-05-19 NOTE — Patient Instructions (Signed)
1. Monitor menstrual calendar bleeding 2. Follow-up in 6 months for reassessment and bleeding and pelvic pain

## 2016-05-19 NOTE — Progress Notes (Signed)
Chief complaint: 1.  Endometriosis.  Patient presents for 3 month follow-up after diagnosis of endometriosis is made by laparoscopic excision of bladder flap lesion.  She did have bilateral salpingectomy performed as well.  Patient states that she is having mild cramps, not requiring significant analgesic therapy.  She is not experiencing significant dyspareunia. Last menstrual period was 04/10/2016.  She is experiencing some mild night sweats.  No vasomotor symptoms in the form of hot flashes have been noted.  OBJECTIVE: BP 103/63 mmHg  Pulse 76  Ht 5\' 6"  (1.676 m)  Wt 125 lb 12.8 oz (57.063 kg)  BMI 20.31 kg/m2  LMP 04/10/2016 (Exact Date) Pleasant white female in no acute distress. Abdomen: Soft, nontender. Pelvic exam: External genitalia-normal.  , BUS-normal.  Vagina-normal.  Cervix-no lesions; 1/4.  Cervical motion tenderness.  Uterus-midplane, mobile, 1/4 tender.  Adnexa-nonpalpable; nontender  Rectovaginal-normal.  External exam.  ASSESSMENT: 1.  Endometriosis, minimally symptomatic. 2.  Amenorrhea. 3.  Status post bilateral salpingectomy. 4.  Mild hot flashes/night sweat  PLAN: 1. Maintain menstrual calendar.  Monitor for bleeding analysis. 2.  Return in 6 months for follow-up  A total of 15 minutes were spent face-to-face with the patient during this encounter and over half of that time dealt with counseling and coordination of care.  [name], MD  Note: This dictation was prepared with Dragon dictation along with smaller phrase technology. Any transcriptional errors that result from this process are unintentional.

## 2016-05-20 LAB — URINE CULTURE: ORGANISM ID, BACTERIA: NO GROWTH

## 2016-05-24 ENCOUNTER — Other Ambulatory Visit: Payer: Self-pay | Admitting: Family Medicine

## 2016-05-24 MED ORDER — PROCTOFOAM HC 1-1 % RE FOAM: Freq: Three times a day (TID) | RECTAL | Status: DC

## 2016-05-24 NOTE — Telephone Encounter (Signed)
Requesting refill on Proctofoam. Please send to [facility]

## 2016-05-24 NOTE — Telephone Encounter (Signed)
Refill request was sent to Dr. Krichna Sowles for approval and submission.  

## 2016-06-20 ENCOUNTER — Ambulatory Visit (INDEPENDENT_AMBULATORY_CARE_PROVIDER_SITE_OTHER): Payer: 59 | Admitting: Family Medicine

## 2016-06-20 ENCOUNTER — Encounter: Payer: Self-pay | Admitting: Family Medicine

## 2016-06-20 VITALS — BP 118/68 | HR 88 | Temp 98.3°F | Resp 16 | Ht 66.0 in | Wt 124.5 lb

## 2016-06-20 DIAGNOSIS — N760 Acute vaginitis: Secondary | ICD-10-CM

## 2016-06-20 DIAGNOSIS — R102 Pelvic and perineal pain: Secondary | ICD-10-CM

## 2016-06-20 DIAGNOSIS — R300 Dysuria: Secondary | ICD-10-CM | POA: Diagnosis not present

## 2016-06-20 DIAGNOSIS — N949 Unspecified condition associated with female genital organs and menstrual cycle: Secondary | ICD-10-CM

## 2016-06-20 DIAGNOSIS — G43009 Migraine without aura, not intractable, without status migrainosus: Secondary | ICD-10-CM

## 2016-06-20 DIAGNOSIS — F419 Anxiety disorder, unspecified: Secondary | ICD-10-CM | POA: Diagnosis not present

## 2016-06-20 DIAGNOSIS — D519 Vitamin B12 deficiency anemia, unspecified: Secondary | ICD-10-CM | POA: Diagnosis not present

## 2016-06-20 DIAGNOSIS — G4700 Insomnia, unspecified: Secondary | ICD-10-CM | POA: Diagnosis not present

## 2016-06-20 DIAGNOSIS — K219 Gastro-esophageal reflux disease without esophagitis: Secondary | ICD-10-CM | POA: Diagnosis not present

## 2016-06-20 DIAGNOSIS — J45998 Other asthma: Secondary | ICD-10-CM

## 2016-06-20 DIAGNOSIS — K5900 Constipation, unspecified: Secondary | ICD-10-CM | POA: Diagnosis not present

## 2016-06-20 DIAGNOSIS — Z113 Encounter for screening for infections with a predominantly sexual mode of transmission: Secondary | ICD-10-CM

## 2016-06-20 DIAGNOSIS — K5909 Other constipation: Secondary | ICD-10-CM

## 2016-06-20 DIAGNOSIS — J45909 Unspecified asthma, uncomplicated: Secondary | ICD-10-CM

## 2016-06-20 LAB — POCT WET PREP (WET MOUNT)

## 2016-06-20 LAB — POCT URINALYSIS DIPSTICK
Bilirubin, UA: NEGATIVE
GLUCOSE UA: NEGATIVE
KETONES UA: NEGATIVE
Leukocytes, UA: NEGATIVE
Nitrite, UA: NEGATIVE
Protein, UA: NEGATIVE
RBC UA: NEGATIVE
SPEC GRAV UA: 1.015
UROBILINOGEN UA: 0.2
pH, UA: 7.5

## 2016-06-20 MED ORDER — FLUTICASONE-SALMETEROL 250-50 MCG/DOSE IN AEPB: 2.0000 | INHALATION_SPRAY | Freq: Two times a day (BID) | RESPIRATORY_TRACT | Status: DC

## 2016-06-20 MED ORDER — DEXILANT 60 MG PO CPDR: 60.0000 mg | DELAYED_RELEASE_CAPSULE | Freq: Every day | ORAL | Status: DC

## 2016-06-20 MED ORDER — LINACLOTIDE 145 MCG PO CAPS: 145.0000 ug | ORAL_CAPSULE | Freq: Every day | ORAL | Status: DC

## 2016-06-20 MED ORDER — CYANOCOBALAMIN 1000 MCG/ML IJ SOLN
1000.0000 ug | Freq: Once | INTRAMUSCULAR | Status: AC
  Administered 2016-06-20: 1000 ug via INTRAMUSCULAR

## 2016-06-20 MED ORDER — FLUCONAZOLE 150 MG PO TABS: 150.0000 mg | ORAL_TABLET | Freq: Once | ORAL | Status: DC

## 2016-06-20 MED ORDER — ZOLPIDEM TARTRATE 5 MG PO TABS: 5.0000 mg | ORAL_TABLET | Freq: Every evening | ORAL | Status: DC | PRN

## 2016-06-20 MED ORDER — METRONIDAZOLE 0.75 % VA GEL: 1.0000 | Freq: Two times a day (BID) | VAGINAL | Status: DC

## 2016-06-20 NOTE — Addendum Note (Signed)
Addended by: Inda Coke on: 06/20/2016 04:17 PM   Modules accepted: Orders

## 2016-06-20 NOTE — Progress Notes (Signed)
Name: Jessica Carroll   MRN: [id number]    DOB: 23-Sep-1971   Date:06/20/2016       Progress Note  Subjective  Chief Complaint  Chief Complaint  Patient presents with  . Medication Refill    patient is here for her 78-month f/u  . Migraine  . Insomnia  . Constipation  . B12 deficiency anemia  . Asthma  . Anxiety  . Gastroesophageal Reflux  . Endometriosis  . Vaginitis    patient stated that she has been having some discharge along with some burning and pain. she also mentioned that her anfd her boyfriend has had some recent issues and is not sure if her has cut out onher or not.    HPI  Constipation: she used to take Linzess but has been off medication, no straining, she has been eating more fiber and is currently taking Miralax and coffee in am's to help with symptoms. She still strains and is willing to go back to Linzess  Migraine: she denies aura, about once or twice monthly, Maxalt or Ibuprofen usually improves the symptoms within a few hours. She has photophobia and phonophobia, she also has nausea at times. Headache is described as throbbing and usually frontal and radiates to nuchal area.  B12 deficiency: she is here and would like to have B12 today  Insomnia/Anxiety: she was doing well on Alprazolam, but she has been stressed since Christmas, boyfriend of 12 years broke up with her, they are back together, but she is not sure of the relationship. He is getting home late, going out with his guy friends.   Endometriosis: found by gyn during laparoscopic surgery for tubal ligation, cycles lasting a few days, not as heavy as it was right after procedure, denies abdominal pain/cramping  Intermittent low back : symptoms are sporadic, takes ibuprofen and or flexeril prn with control of symptoms. , usually lower back and sometimes radiates to mid back, worse with lifting at work.   Asthma Moderate: she used Advair daily, she still has intermittent chest tightness, no SOB, she  still has a dry cough multiple times weekly, no wheezing.  Vaginal discomfort and itching: she is very concerned about STI, feels different than yeast infection, and did not respond to Diflucan, no vaginal discharge, but had pain during intercourse.   Patient Active Problem List   Diagnosis Date Noted  . Endometriosis 03/17/2016  . Nausea in adult patient 02/24/2016  . Endometriosis determined by laparoscopy 01/25/2016  . Dysmenorrhea 12/30/2015  . Dyspareunia, female 12/30/2015  . Chronic constipation 08/14/2015  . Yeast vaginitis 08/14/2015  . Bilateral ganglion cysts of wrists 08/14/2015  . Mild mitral regurgitation 05/19/2015  . B12 deficiency 05/18/2015  . Asthma, moderate 05/18/2015  . Anxiety 05/18/2015  . Insomnia, persistent 05/18/2015  . Eczema of hand 05/18/2015  . Viral keratitis 05/18/2015  . Migraine without aura and without status migrainosus, not intractable 05/18/2015  . Numerous moles 05/18/2015  . Allergic rhinitis 05/18/2015  . History of corneal transplant 05/18/2015  . Gastro-esophageal reflux disease without esophagitis 12/16/2009    Past Surgical History  Procedure Laterality Date  . Upper gi endoscopy  07/19/2012  . Corneal transplant Right 01/07/2013  . Eyelid surgery Right June 19, 2015    [facility] by Dr. Norlene Duel  . Laparoscopic excision and fulguration of endometriosis N/A     bladder flap and uterosacral ligament endometriosis, excised and cauterized  . Laparoscopic bilateral salpingectomy N/A 01/25/2016    Procedure: LAPAROSCOPIC BILATERAL SALPINGECTOMY,  ADHESIOLYSIS, LAPAROSCOPIC EXCISION/FULGERATION ENDOMETRIOSIS ;  Surgeon: [name], MD;  Location: [facility] ORS;  Service: Gynecology;  Laterality: N/A;    Family History  Problem Relation Age of Onset  . Adopted: Yes  . Hyperlipidemia Mother   . Hypertension Mother   . Diabetes Father   . Heart failure Father 31    hear attack  . Cancer Neg Hx     Social History   Social  History  . Marital Status: Single    Spouse Name: N/A  . Number of Children: N/A  . Years of Education: 12   Occupational History  . textile    Social History Main Topics  . Smoking status: Never Smoker   . Smokeless tobacco: Never Used  . Alcohol Use: 0.0 oz/week    0 Standard drinks or equivalent per week     Comment: occ. wine  . Drug Use: No  . Sexual Activity:    Partners: Male    Birth Control/ Protection: None, Surgical   Other Topics Concern  . Not on file   Social History Narrative     Current outpatient prescriptions:  .  albuterol (PROVENTIL HFA;VENTOLIN HFA) 108 (90 Base) MCG/ACT inhaler, Inhale 2 puffs into the lungs as needed., Disp: 1 Inhaler, Rfl: 0 .  ALPRAZolam (XANAX) 0.5 MG tablet, Take 1 tablet (0.5 mg total) by mouth 2 (two) times daily as needed for anxiety. For sleep, Disp: 30 tablet, Rfl: 0 .  cyclobenzaprine (FLEXERIL) 10 MG tablet, Take 1 tablet (10 mg total) by mouth as needed., Disp: 30 tablet, Rfl: 0 .  DEXILANT 60 MG capsule, Take 1 capsule (60 mg total) by mouth daily., Disp: 30 capsule, Rfl: 2 .  fluticasone (FLONASE) 50 MCG/ACT nasal spray, Place 2 sprays into the nose as needed., Disp: , Rfl:  .  Fluticasone-Salmeterol (ADVAIR) 250-50 MCG/DOSE AEPB, Inhale 2 puffs into the lungs 2 (two) times daily., Disp: 60 each, Rfl: 5 .  ibuprofen (ADVIL,MOTRIN) 800 MG tablet, Take 1 tablet (800 mg total) by mouth 3 (three) times daily as needed., Disp: 90 tablet, Rfl: 0 .  MULTIPLE VITAMIN PO, Take 1 tablet by mouth daily., Disp: , Rfl:  .  prednisoLONE acetate (PRED FORTE) 1 % ophthalmic suspension, Administer 1 drop to the right eye daily., Disp: , Rfl:  .  PROCTOFOAM HC rectal foam, Place rectally 3 (three) times daily., Disp: 10 g, Rfl: 0 .  rizatriptan (MAXALT) 10 MG tablet, Take 1 tablet (10 mg total) by mouth 2 (two) times daily as needed., Disp: 10 tablet, Rfl: 2 .  triamcinolone cream (KENALOG) 0.1 %, 1 application as needed., Disp: , Rfl:  .   valACYclovir (VALTREX) 500 MG tablet, Take 1 tablet by mouth 2 (two) times daily., Disp: , Rfl: 0 .  zolpidem (AMBIEN) 5 MG tablet, Take 1 tablet (5 mg total) by mouth at bedtime as needed for sleep., Disp: 30 tablet, Rfl: 2  Allergies  Allergen Reactions  . Penicillins Nausea Only     ROS  Constitutional: Negative for fever or weight change.  Respiratory: Negative for cough and shortness of breath.   Cardiovascular: Negative for chest pain or palpitations.  Gastrointestinal: Negative for abdominal pain, no bowel changes.  Musculoskeletal: Negative for gait problem or joint swelling.  Skin: Negative for rash.  Neurological: Negative for dizziness or headache.  No other specific complaints in a complete review of systems (except as listed in HPI above).  Objective  Filed Vitals:   06/20/16 1517  BP: 118/68  Pulse: 88  Temp: 98.3 F (36.8 C)  TempSrc: Oral  Resp: 16  Height: 5\' 6"  (1.676 m)  Weight: 124 lb 8 oz (56.473 kg)  SpO2: 97%    Body mass index is 20.1 kg/(m^2).  Physical Exam  Constitutional: Patient appears well-developed and well-nourished.  No distress.  HEENT: head atraumatic, normocephalic, pupils equal and reactive to light, neck supple, throat within normal limits Cardiovascular: Normal rate, regular rhythm and normal heart sounds.  No murmur heard. No BLE edema. Pulmonary/Chest: Effort normal and breath sounds normal. No respiratory distress. Abdominal: Soft.  There is no tenderness. Psychiatric: Patient has a normal mood and affect. behavior is normal. Judgment and thought content normal. GYN clear vaginal discharge, mild supra pubic pain, no masses  Recent Results (from the past 2160 hour(s))  POCT urinalysis dipstick     Status: None   Collection Time: 05/19/16 11:35 AM  Result Value Ref Range   Color, UA yellow    Clarity, UA clear    Glucose, UA neg    Bilirubin, UA neg    Ketones, UA neg    Spec Grav, UA <=1.005    Blood, UA neg    pH, UA  8.0    Protein, UA neg    Urobilinogen, UA 0.2    Nitrite, UA neg    Leukocytes, UA Negative Negative  Urine culture     Status: None   Collection Time: 05/19/16 12:00 PM  Result Value Ref Range   Urine Culture, Routine Final report    Urine Culture result 1 No growth   POCT urinalysis dipstick     Status: Normal   Collection Time: 06/20/16  3:43 PM  Result Value Ref Range   Color, UA straw    Clarity, UA clear    Glucose, UA negative    Bilirubin, UA negative    Ketones, UA negative    Spec Grav, UA 1.015    Blood, UA negative    pH, UA 7.5    Protein, UA negative    Urobilinogen, UA 0.2    Nitrite, UA negative    Leukocytes, UA Negative Negative      PHQ2/9: Depression screen [facility] 2/9 06/20/2016 03/17/2016 08/14/2015 05/13/2015  Decreased Interest 0 0 0 2  Down, Depressed, Hopeless 0 0 0 1  PHQ - 2 Score 0 0 0 3  Altered sleeping - - - 1  Tired, decreased energy - - - 3  Change in appetite - - - 2  Feeling bad or failure about yourself  - - - 0  Trouble concentrating - - - 0  Moving slowly or fidgety/restless - - - 1  Suicidal thoughts - - - 0  PHQ-9 Score - - - 10  Difficult doing work/chores - - - Somewhat difficult     Fall Risk: Fall Risk  06/20/2016 03/17/2016 12/17/2015 08/14/2015 05/13/2015  Falls in the past year? No No No No No  Risk for fall due to : - - - - Impaired vision     Functional Status Survey: Is the patient deaf or have difficulty hearing?: No Does the patient have difficulty seeing, even when wearing glasses/contacts?: No Does the patient have difficulty concentrating, remembering, or making decisions?: No Does the patient have difficulty walking or climbing stairs?: No Does the patient have difficulty dressing or bathing?: No Does the patient have difficulty doing errands alone such as visiting a doctor's office or shopping?: No   Assessment & Plan  1. Vaginal burning  - POCT urinalysis dipstick - fluconazole (DIFLUCAN) 150 MG tablet; Take 1  tablet (150 mg total) by mouth once.  Dispense: 3 tablet; Refill: 2 - POCT Wet Prep [facility]) - Chlamydia/Gonococcus/Trichomonas, NAA  2. Vaginal pain  - POCT urinalysis dipstick - fluconazole (DIFLUCAN) 150 MG tablet; Take 1 tablet (150 mg total) by mouth once.  Dispense: 3 tablet; Refill: 2  3. Migraine without aura and without status migrainosus, not intractable  Doing well  4. Insomnia, persistent  - zolpidem (AMBIEN) 5 MG tablet; Take 1 tablet (5 mg total) by mouth at bedtime as needed for sleep.  Dispense: 30 tablet; Refill: 2  5. Chronic constipation  - linaclotide (LINZESS) 145 MCG CAPS capsule; Take 1 capsule (145 mcg total) by mouth daily before breakfast.  Dispense: 30 capsule; Refill: 2  6. B12 deficiency anemia  -B12 injection today  7. Asthma, moderate  - Fluticasone-Salmeterol (ADVAIR) 250-50 MCG/DOSE AEPB; Inhale 2 puffs into the lungs 2 (two) times daily.  Dispense: 60 each; Refill: 5  8. Gastro-esophageal reflux disease without esophagitis  - DEXILANT 60 MG capsule; Take 1 capsule (60 mg total) by mouth daily.  Dispense: 30 capsule; Refill: 2  9. Anxiety  Continue prn medication   10. Routine screening for STI (sexually transmitted infection)  - fluconazole (DIFLUCAN) 150 MG tablet; Take 1 tablet (150 mg total) by mouth once.  Dispense: 3 tablet; Refill: 2 - POCT Wet Prep [facility]) - Chlamydia/Gonococcus/Trichomonas, NAA   11. Vaginitis  - metroNIDAZOLE (METROGEL VAGINAL) 0.75 % vaginal gel; Place 1 Applicatorful vaginally 2 (two) times daily.  Dispense: 70 g; Refill: 0  12. Dysuria  - Urine culture

## 2016-06-21 LAB — URINE CULTURE

## 2016-06-23 LAB — CHLAMYDIA/GONOCOCCUS/TRICHOMONAS, NAA
Chlamydia by NAA: NEGATIVE
Gonococcus by NAA: NEGATIVE
TRICH VAG BY NAA: NEGATIVE

## 2016-07-23 ENCOUNTER — Other Ambulatory Visit: Payer: Self-pay | Admitting: Family Medicine

## 2016-07-23 DIAGNOSIS — M545 Low back pain, unspecified: Secondary | ICD-10-CM

## 2016-07-27 ENCOUNTER — Other Ambulatory Visit: Payer: Self-pay | Admitting: Family Medicine

## 2016-07-27 DIAGNOSIS — K219 Gastro-esophageal reflux disease without esophagitis: Secondary | ICD-10-CM

## 2016-08-17 ENCOUNTER — Other Ambulatory Visit: Payer: Self-pay | Admitting: Family Medicine

## 2016-08-17 ENCOUNTER — Ambulatory Visit (INDEPENDENT_AMBULATORY_CARE_PROVIDER_SITE_OTHER): Payer: 59 | Admitting: Family Medicine

## 2016-08-17 ENCOUNTER — Encounter: Payer: Self-pay | Admitting: Family Medicine

## 2016-08-17 ENCOUNTER — Other Ambulatory Visit: Payer: Self-pay

## 2016-08-17 VITALS — BP 122/74 | HR 73 | Temp 98.1°F | Resp 16 | Ht 66.0 in | Wt 123.4 lb

## 2016-08-17 DIAGNOSIS — Z0000 Encounter for general adult medical examination without abnormal findings: Secondary | ICD-10-CM

## 2016-08-17 DIAGNOSIS — Z131 Encounter for screening for diabetes mellitus: Secondary | ICD-10-CM

## 2016-08-17 DIAGNOSIS — Z01419 Encounter for gynecological examination (general) (routine) without abnormal findings: Secondary | ICD-10-CM

## 2016-08-17 DIAGNOSIS — B379 Candidiasis, unspecified: Secondary | ICD-10-CM

## 2016-08-17 DIAGNOSIS — K219 Gastro-esophageal reflux disease without esophagitis: Secondary | ICD-10-CM

## 2016-08-17 DIAGNOSIS — Z13220 Encounter for screening for lipoid disorders: Secondary | ICD-10-CM | POA: Diagnosis not present

## 2016-08-17 DIAGNOSIS — Z1239 Encounter for other screening for malignant neoplasm of breast: Secondary | ICD-10-CM

## 2016-08-17 DIAGNOSIS — Z124 Encounter for screening for malignant neoplasm of cervix: Secondary | ICD-10-CM | POA: Diagnosis not present

## 2016-08-17 MED ORDER — FLUCONAZOLE 150 MG PO TABS: 150.0000 mg | ORAL_TABLET | ORAL | 2 refills | Status: DC

## 2016-08-17 MED ORDER — DEXILANT 60 MG PO CPDR: 60.0000 mg | DELAYED_RELEASE_CAPSULE | Freq: Every day | ORAL | 5 refills | Status: DC

## 2016-08-17 NOTE — Progress Notes (Signed)
Name: Jessica Carroll   MRN: [id number]    DOB: May 01, 1971   Date:08/17/2016       Progress Note  Subjective  Chief Complaint  Chief Complaint  Patient presents with  . Annual Exam    HPI  Well Woman: living with boyfriend of 13 years, their relationship has been better, going to start counseling. No vaginal discharge or pain, she continues to have recurrent yeast infection . Mammogram is due in Feb. Feeling tired and still has recurrent yeast infections.    Patient Active Problem List   Diagnosis Date Noted  . Endometriosis 03/17/2016  . Nausea in adult patient 02/24/2016  . Endometriosis determined by laparoscopy 01/25/2016  . Dysmenorrhea 12/30/2015  . Dyspareunia, female 12/30/2015  . Chronic constipation 08/14/2015  . Yeast vaginitis 08/14/2015  . Bilateral ganglion cysts of wrists 08/14/2015  . Mild mitral regurgitation 05/19/2015  . B12 deficiency 05/18/2015  . Asthma, moderate 05/18/2015  . Anxiety 05/18/2015  . Insomnia, persistent 05/18/2015  . Eczema of hand 05/18/2015  . Viral keratitis 05/18/2015  . Migraine without aura and without status migrainosus, not intractable 05/18/2015  . Numerous moles 05/18/2015  . Allergic rhinitis 05/18/2015  . History of corneal transplant 05/18/2015  . Gastro-esophageal reflux disease without esophagitis 12/16/2009    Past Surgical History:  Procedure Laterality Date  . CORNEAL TRANSPLANT Right 01/07/2013  . eyelid surgery Right June 19, 2015   [facility] by Dr. Norlene Duel  . LAPAROSCOPIC BILATERAL SALPINGECTOMY N/A 01/25/2016   Procedure: LAPAROSCOPIC BILATERAL SALPINGECTOMY, ADHESIOLYSIS, LAPAROSCOPIC EXCISION/FULGERATION ENDOMETRIOSIS ;  Surgeon: [name], MD;  Location: [facility] ORS;  Service: Gynecology;  Laterality: N/A;  . Laparoscopic excision and fulguration of endometriosis N/A    bladder flap and uterosacral ligament endometriosis, excised and cauterized  . UPPER GI ENDOSCOPY  07/19/2012    Family  History  Problem Relation Age of Onset  . Adopted: Yes  . Hyperlipidemia Mother   . Hypertension Mother   . Diabetes Father   . Heart failure Father 44    hear attack  . Cancer Neg Hx     Social History   Social History  . Marital status: Single    Spouse name: N/A  . Number of children: N/A  . Years of education: 28   Occupational History  . textile    Social History Main Topics  . Smoking status: Never Smoker  . Smokeless tobacco: Never Used  . Alcohol use 0.0 oz/week     Comment: occ. wine  . Drug use: No  . Sexual activity: Yes    Partners: Male    Birth control/ protection: None, Surgical   Other Topics Concern  . Not on file   Social History Narrative  . No narrative on file     Current Outpatient Prescriptions:  .  acyclovir (ZOVIRAX) 400 MG tablet, Take 1 tablet by mouth twice day, Disp: , Rfl:  .  albuterol (PROVENTIL HFA;VENTOLIN HFA) 108 (90 Base) MCG/ACT inhaler, Inhale 2 puffs into the lungs as needed., Disp: 1 Inhaler, Rfl: 0 .  ALPRAZolam (XANAX) 0.5 MG tablet, Take 1 tablet (0.5 mg total) by mouth 2 (two) times daily as needed for anxiety. For sleep, Disp: 30 tablet, Rfl: 0 .  cyclobenzaprine (FLEXERIL) 10 MG tablet, Take 1 tablet (10 mg total) by mouth as needed., Disp: 30 tablet, Rfl: 0 .  DEXILANT 60 MG capsule, TAKE 1 CAPSULE(60 MG) BY MOUTH DAILY, Disp: 30 capsule, Rfl: 0 .  fluticasone (FLONASE) 50  MCG/ACT nasal spray, Place 2 sprays into the nose as needed., Disp: , Rfl:  .  Fluticasone-Salmeterol (ADVAIR) 250-50 MCG/DOSE AEPB, Inhale 2 puffs into the lungs 2 (two) times daily., Disp: 60 each, Rfl: 5 .  ibuprofen (ADVIL,MOTRIN) 800 MG tablet, TAKE 1 TABLET(800 MG) BY MOUTH THREE TIMES DAILY AS NEEDED, Disp: 90 tablet, Rfl: 0 .  linaclotide (LINZESS) 145 MCG CAPS capsule, Take 1 capsule (145 mcg total) by mouth daily before breakfast., Disp: 30 capsule, Rfl: 2 .  MULTIPLE VITAMIN PO, Take 1 tablet by mouth daily., Disp: , Rfl:  .  prednisoLONE  acetate (PRED FORTE) 1 % ophthalmic suspension, Administer 1 drop to the right eye daily., Disp: , Rfl:  .  PROCTOFOAM HC rectal foam, Place rectally 3 (three) times daily., Disp: 10 g, Rfl: 0 .  rizatriptan (MAXALT) 10 MG tablet, Take 1 tablet (10 mg total) by mouth 2 (two) times daily as needed., Disp: 10 tablet, Rfl: 2 .  triamcinolone cream (KENALOG) 0.1 %, 1 application as needed., Disp: , Rfl:  .  valACYclovir (VALTREX) 500 MG tablet, Take 1 tablet by mouth 2 (two) times daily., Disp: , Rfl: 0 .  zolpidem (AMBIEN) 5 MG tablet, Take 1 tablet (5 mg total) by mouth at bedtime as needed for sleep., Disp: 30 tablet, Rfl: 2  Allergies  Allergen Reactions  . Penicillins Nausea Only     ROS  Constitutional: Negative for fever or  significant weight change.  Respiratory: Positive  for cough and shortness of breath.   Cardiovascular: Negative for chest pain or palpitations.  Gastrointestinal: Negative for abdominal pain, no bowel changes.  Musculoskeletal: Negative for gait problem or joint swelling.  Right lower back pain ( started yesterday - had grandaughter for the weekend and carried her all the time ) Skin: Negative for rash.  Neurological: Negative for dizziness or headache.  No other specific complaints in a complete review of systems (except as listed in HPI above).  Objective  Vitals:   08/17/16 0830  BP: 122/74  Pulse: 73  Resp: 16  Temp: 98.1 F (36.7 C)  TempSrc: Oral  SpO2: 95%  Weight: 123 lb 6.4 oz (56 kg)  Height: 5\' 6"  (1.676 m)    Body mass index is 19.92 kg/m.  Physical Exam  Constitutional: Patient appears well-developed and well-nourished. No distress.  HENT: Head: Normocephalic and atraumatic. Ears: B TMs ok, no erythema or effusion; Nose: Nose normal. Mouth/Throat: Oropharynx is clear and moist. No oropharyngeal exudate.  Eyes: Conjunctivae and EOM are normal. Pupils are equal, round, and reactive to light. No scleral icterus.  Neck: Normal range of  motion. Neck supple. No JVD present. No thyromegaly present.  Cardiovascular: Normal rate, regular rhythm and normal heart sounds.  No murmur heard. No BLE edema. Pulmonary/Chest: Effort normal and breath sounds normal. No respiratory distress. Abdominal: Soft. Bowel sounds are normal, no distension. There is no tenderness. no masses Breast: no lumps or masses, no nipple discharge or rashes FEMALE GENITALIA:  External genitalia normal External urethra normal Vaginal vault normal without discharge or lesions Cervix normal without discharge or lesions Bimanual exam normal without masses RECTAL: not done Musculoskeletal: Normal range of motion, no joint effusions. No gross deformities Neurological: he is alert and oriented to person, place, and time. No cranial nerve deficit. Coordination, balance, strength, speech and gait are normal.  Skin: Skin is warm and dry. No rash noted. No erythema.  Psychiatric: Patient has a normal mood and affect. behavior is normal. Judgment  and thought content normal.  Recent Results (from the past 2160 hour(s))  POCT urinalysis dipstick     Status: None   Collection Time: 05/19/16 11:35 AM  Result Value Ref Range   Color, UA yellow    Clarity, UA clear    Glucose, UA neg    Bilirubin, UA neg    Ketones, UA neg    Spec Grav, UA <=1.005    Blood, UA neg    pH, UA 8.0    Protein, UA neg    Urobilinogen, UA 0.2    Nitrite, UA neg    Leukocytes, UA Negative Negative  Urine culture     Status: None   Collection Time: 05/19/16 12:00 PM  Result Value Ref Range   Urine Culture, Routine Final report    Urine Culture result 1 No growth   POCT urinalysis dipstick     Status: Normal   Collection Time: 06/20/16  3:43 PM  Result Value Ref Range   Color, UA straw    Clarity, UA clear    Glucose, UA negative    Bilirubin, UA negative    Ketones, UA negative    Spec Grav, UA 1.015    Blood, UA negative    pH, UA 7.5    Protein, UA negative    Urobilinogen,  UA 0.2    Nitrite, UA negative    Leukocytes, UA Negative Negative  POCT Wet Prep [facility])     Status: Abnormal   Collection Time: 06/20/16  4:08 PM  Result Value Ref Range   Source Wet Prep POC vaginal    WBC, Wet Prep HPF POC few    Bacteria Wet Prep HPF POC Few None, Few, Too numerous to count   BACTERIA WET PREP MORPHOLOGY POC     Clue Cells Wet Prep HPF POC Many (A) None, Too numerous to count   Clue Cells Wet Prep Whiff POC     Yeast Wet Prep HPF POC Moderate    KOH Wet Prep POC     Trichomonas Wet Prep HPF POC none   Urine culture     Status: None   Collection Time: 06/20/16  4:11 PM  Result Value Ref Range   Colony Count 2,000 COLONIES/ML    Organism ID, Bacteria Insignificant Growth   Chlamydia/Gonococcus/Trichomonas, NAA     Status: None   Collection Time: 06/21/16 12:00 AM  Result Value Ref Range   Chlamydia by NAA Negative Negative   Gonococcus by NAA Negative Negative   Trich vag by NAA Negative Negative      PHQ2/9: Depression screen [facility] 2/9 06/20/2016 03/17/2016 08/14/2015 05/13/2015  Decreased Interest 0 0 0 2  Down, Depressed, Hopeless 0 0 0 1  PHQ - 2 Score 0 0 0 3  Altered sleeping - - - 1  Tired, decreased energy - - - 3  Change in appetite - - - 2  Feeling bad or failure about yourself  - - - 0  Trouble concentrating - - - 0  Moving slowly or fidgety/restless - - - 1  Suicidal thoughts - - - 0  PHQ-9 Score - - - 10  Difficult doing work/chores - - - Somewhat difficult     Fall Risk: Fall Risk  06/20/2016 03/17/2016 12/17/2015 08/14/2015 05/13/2015  Falls in the past year? No No No No No  Risk for fall due to : - - - - Impaired vision     Assessment & Plan  1. Well woman exam  Discussed importance of 150 minutes of physical activity weekly, eat two servings of fish weekly, eat one serving of tree nuts ( cashews, pistachios, pecans, almonds.Marland Kitchen) every other day, eat 6 servings of fruit/vegetables daily and drink plenty of water and avoid sweet beverages.   - COMPLETE METABOLIC PANEL WITH GFR - CBC with Differential/Platelet - TSH - Vitamin B12 - VITAMIN D 25 Hydroxy (Vit-D Deficiency, Fractures)  2. Cervical cancer screening  - Pap IG, CT/NG NAA, and HPV (high risk)  3. Breast cancer screening  Repeat in Feb  4. Lipid screening  - Lipid panel  5. Diabetes mellitus screening  - Hemoglobin A1c  4. Lipid screening  - Lipid panel  5. Diabetes mellitus screening  - Hemoglobin A1c  6. Yeast infection  - fluconazole (DIFLUCAN) 150 MG tablet; Take 1 tablet (150 mg total) by mouth once a week.  Dispense: 4 tablet; Refill: 2

## 2016-08-18 LAB — TSH: TSH: 0.85 m[IU]/L

## 2016-08-18 LAB — VITAMIN D 25 HYDROXY (VIT D DEFICIENCY, FRACTURES): VIT D 25 HYDROXY: 36 ng/mL (ref 30–100)

## 2016-08-18 LAB — COMPLETE METABOLIC PANEL WITH GFR
ALBUMIN: 4.3 g/dL (ref 3.6–5.1)
ALK PHOS: 44 U/L (ref 33–115)
ALT: 10 U/L (ref 6–29)
AST: 16 U/L (ref 10–35)
BILIRUBIN TOTAL: 0.8 mg/dL (ref 0.2–1.2)
BUN: 14 mg/dL (ref 7–25)
CO2: 26 mmol/L (ref 20–31)
CREATININE: 0.67 mg/dL (ref 0.50–1.10)
Calcium: 9.1 mg/dL (ref 8.6–10.2)
Chloride: 104 mmol/L (ref 98–110)
GLUCOSE: 84 mg/dL (ref 65–99)
Potassium: 4.1 mmol/L (ref 3.5–5.3)
Sodium: 139 mmol/L (ref 135–146)
TOTAL PROTEIN: 7.2 g/dL (ref 6.1–8.1)

## 2016-08-18 LAB — CBC WITH DIFFERENTIAL/PLATELET
BASOS ABS: 76 {cells}/uL (ref 0–200)
Basophils Relative: 1 %
Eosinophils Absolute: 76 cells/uL (ref 15–500)
Eosinophils Relative: 1 %
HCT: 36.2 % (ref 35.0–45.0)
HEMOGLOBIN: 11.5 g/dL — AB (ref 11.7–15.5)
LYMPHS PCT: 27 %
Lymphs Abs: 2052 cells/uL (ref 850–3900)
MCH: 29.9 pg (ref 27.0–33.0)
MCHC: 31.8 g/dL — ABNORMAL LOW (ref 32.0–36.0)
MCV: 94 fL (ref 80.0–100.0)
MONO ABS: 380 {cells}/uL (ref 200–950)
MPV: 12.1 fL (ref 7.5–12.5)
Monocytes Relative: 5 %
NEUTROS PCT: 66 %
Neutro Abs: 5016 cells/uL (ref 1500–7800)
PLATELETS: 215 10*3/uL (ref 140–400)
RBC: 3.85 MIL/uL (ref 3.80–5.10)
RDW: 13 % (ref 11.0–15.0)
WBC: 7.6 10*3/uL (ref 3.8–10.8)

## 2016-08-18 LAB — LIPID PANEL
CHOLESTEROL: 179 mg/dL (ref 125–200)
HDL: 66 mg/dL (ref >=46)
LDL Cholesterol: 101 mg/dL (ref <130)
TRIGLYCERIDES: 58 mg/dL (ref <150)
Total CHOL/HDL Ratio: 2.7 Ratio (ref <=5.0)
VLDL: 12 mg/dL (ref <30)

## 2016-08-18 LAB — PAP IG, CT-NG NAA, HPV HIGH-RISK
CHLAMYDIA PROBE AMP: NOT DETECTED
GC PROBE AMP: NOT DETECTED
HPV DNA High Risk: NOT DETECTED

## 2016-08-18 LAB — HEMOGLOBIN A1C
Hgb A1c MFr Bld: 5.1 % (ref <5.7)
MEAN PLASMA GLUCOSE: 100 mg/dL

## 2016-08-18 LAB — VITAMIN B12: Vitamin B-12: 302 pg/mL (ref 200–1100)

## 2016-08-21 ENCOUNTER — Other Ambulatory Visit: Payer: Self-pay | Admitting: Family Medicine

## 2016-08-21 DIAGNOSIS — D649 Anemia, unspecified: Secondary | ICD-10-CM

## 2016-08-22 ENCOUNTER — Other Ambulatory Visit: Payer: Self-pay

## 2016-08-22 DIAGNOSIS — D649 Anemia, unspecified: Secondary | ICD-10-CM

## 2016-08-22 LAB — IRON,TIBC AND FERRITIN PANEL
%SAT: 16 % (ref 11–50)
Ferritin: 41 ng/mL (ref 10–232)
Iron: 56 ug/dL (ref 40–190)
TIBC: 356 ug/dL (ref 250–450)

## 2016-08-24 ENCOUNTER — Ambulatory Visit (INDEPENDENT_AMBULATORY_CARE_PROVIDER_SITE_OTHER): Payer: 59

## 2016-08-24 DIAGNOSIS — E538 Deficiency of other specified B group vitamins: Secondary | ICD-10-CM | POA: Diagnosis not present

## 2016-08-24 MED ORDER — CYANOCOBALAMIN 1000 MCG/ML IJ SOLN
1000.0000 ug | Freq: Once | INTRAMUSCULAR | Status: AC
  Administered 2016-08-24: 1000 ug via INTRAMUSCULAR

## 2016-09-02 ENCOUNTER — Ambulatory Visit (INDEPENDENT_AMBULATORY_CARE_PROVIDER_SITE_OTHER): Payer: 59 | Admitting: Family Medicine

## 2016-09-02 ENCOUNTER — Encounter: Payer: Self-pay | Admitting: Family Medicine

## 2016-09-02 ENCOUNTER — Telehealth: Payer: Self-pay | Admitting: Family Medicine

## 2016-09-02 DIAGNOSIS — M546 Pain in thoracic spine: Secondary | ICD-10-CM

## 2016-09-02 DIAGNOSIS — M549 Dorsalgia, unspecified: Secondary | ICD-10-CM | POA: Insufficient documentation

## 2016-09-02 MED ORDER — METAXALONE 800 MG PO TABS: 800.0000 mg | ORAL_TABLET | Freq: Three times a day (TID) | ORAL | 0 refills | Status: DC

## 2016-09-02 NOTE — Telephone Encounter (Signed)
Patient states that insurance will not cover metaxalone. It will cost her $100.

## 2016-09-02 NOTE — Progress Notes (Signed)
Name: Jessica Carroll   MRN: [id number]    DOB: [birth year]   Date:09/02/2016       Progress Note  Subjective  Chief Complaint  Chief Complaint  Patient presents with  . Acute Visit    pain between shoulder blades since yesterday 09/01/16  This patient is usually followed by Dr. Ancil Boozer, new to me  Back Pain  This is a new problem. The problem has been gradually worsening since onset. The pain is present in the thoracic spine. The pain is at a severity of 6/10. The symptoms are aggravated by bending and position (worse with raising arms, bending over). Associated symptoms include chest pain. She has tried analgesics and NSAIDs (Tylenol and ALeve) for the symptoms.    Past Medical History:  Diagnosis Date  . Anxiety   . Asthma 2011  . Diffuse cystic mastopathy   . GERD (gastroesophageal reflux disease)   . Headache     Past Surgical History:  Procedure Laterality Date  . CORNEAL TRANSPLANT Right 01/07/2013  . eyelid surgery Right June 19, 2015   [facility] by Dr. Norlene Duel  . LAPAROSCOPIC BILATERAL SALPINGECTOMY N/A 01/25/2016   Procedure: LAPAROSCOPIC BILATERAL SALPINGECTOMY, ADHESIOLYSIS, LAPAROSCOPIC EXCISION/FULGERATION ENDOMETRIOSIS ;  Surgeon: [name], MD;  Location: [facility] ORS;  Service: Gynecology;  Laterality: N/A;  . Laparoscopic excision and fulguration of endometriosis N/A    bladder flap and uterosacral ligament endometriosis, excised and cauterized  . UPPER GI ENDOSCOPY  07/19/2012    Family History  Problem Relation Age of Onset  . Adopted: Yes  . Hyperlipidemia Mother   . Hypertension Mother   . Diabetes Father   . Heart failure Father 24    hear attack  . Cancer Neg Hx     Social History   Social History  . Marital status: Single    Spouse name: N/A  . Number of children: N/A  . Years of education: 19   Occupational History  . textile    Social History Main Topics  . Smoking status: Never Smoker  . Smokeless tobacco: Never Used  .  Alcohol use 0.0 oz/week     Comment: occ. wine  . Drug use: No  . Sexual activity: Yes    Partners: Male    Birth control/ protection: None, Surgical   Other Topics Concern  . Not on file   Social History Narrative  . No narrative on file     Current Outpatient Prescriptions:  .  acyclovir (ZOVIRAX) 400 MG tablet, Take 1 tablet by mouth twice day, Disp: , Rfl:  .  albuterol (PROVENTIL HFA;VENTOLIN HFA) 108 (90 Base) MCG/ACT inhaler, Inhale 2 puffs into the lungs as needed., Disp: 1 Inhaler, Rfl: 0 .  ALPRAZolam (XANAX) 0.5 MG tablet, Take 1 tablet (0.5 mg total) by mouth 2 (two) times daily as needed for anxiety. For sleep, Disp: 30 tablet, Rfl: 0 .  DEXILANT 60 MG capsule, Take 1 capsule (60 mg total) by mouth daily., Disp: 30 capsule, Rfl: 5 .  fluconazole (DIFLUCAN) 150 MG tablet, Take 1 tablet (150 mg total) by mouth once a week., Disp: 4 tablet, Rfl: 2 .  fluticasone (FLONASE) 50 MCG/ACT nasal spray, Place 2 sprays into the nose as needed., Disp: , Rfl:  .  Fluticasone-Salmeterol (ADVAIR) 250-50 MCG/DOSE AEPB, Inhale 2 puffs into the lungs 2 (two) times daily., Disp: 60 each, Rfl: 5 .  ibuprofen (ADVIL,MOTRIN) 800 MG tablet, TAKE 1 TABLET(800 MG) BY MOUTH THREE TIMES DAILY AS NEEDED,  Disp: 90 tablet, Rfl: 0 .  linaclotide (LINZESS) 145 MCG CAPS capsule, Take 1 capsule (145 mcg total) by mouth daily before breakfast., Disp: 30 capsule, Rfl: 2 .  MULTIPLE VITAMIN PO, Take 1 tablet by mouth daily., Disp: , Rfl:  .  prednisoLONE acetate (PRED FORTE) 1 % ophthalmic suspension, Administer 1 drop to the right eye daily., Disp: , Rfl:  .  PROCTOFOAM HC rectal foam, Place rectally 3 (three) times daily., Disp: 10 g, Rfl: 0 .  rizatriptan (MAXALT) 10 MG tablet, Take 1 tablet (10 mg total) by mouth 2 (two) times daily as needed., Disp: 10 tablet, Rfl: 2 .  triamcinolone cream (KENALOG) 0.1 %, 1 application as needed., Disp: , Rfl:  .  valACYclovir (VALTREX) 500 MG tablet, Take 1 tablet by  mouth 2 (two) times daily., Disp: , Rfl: 0 .  zolpidem (AMBIEN) 5 MG tablet, Take 1 tablet (5 mg total) by mouth at bedtime as needed for sleep., Disp: 30 tablet, Rfl: 2 .  cyclobenzaprine (FLEXERIL) 10 MG tablet, Take 1 tablet (10 mg total) by mouth as needed. (Patient not taking: Reported on 09/02/2016), Disp: 30 tablet, Rfl: 0  Allergies  Allergen Reactions  . Penicillins Nausea Only     Review of Systems  Cardiovascular: Positive for chest pain.  Musculoskeletal: Positive for back pain.     Objective  Vitals:   09/02/16 1044  BP: 119/71  Pulse: 86  Resp: 16  Temp: 98 F (36.7 C)  TempSrc: Oral  SpO2: 96%  Weight: 123 lb (55.8 kg)  Height: 5\' 6"  (1.676 m)    Physical Exam  Constitutional: She is well-developed, well-nourished, and in no distress.  HENT:  Head: Normocephalic and atraumatic.  Cardiovascular: Normal rate, regular rhythm, S1 normal and S2 normal.   Pulmonary/Chest: Effort normal and breath sounds normal. She has no rhonchi.  Musculoskeletal:       Cervical back: She exhibits tenderness, pain and spasm.       Back:  Psychiatric: Mood, memory, affect and judgment normal.  Nursing note and vitals reviewed.      Assessment & Plan  1. Acute upper back pain Related to muscle spasm, start on seven days of Skelaxin, note provided for work absence for 3 days. - metaxalone (SKELAXIN) 800 MG tablet; Take 1 tablet (800 mg total) by mouth 3 (three) times daily.  Dispense: 21 tablet; Refill: 0   Talaya Lamprecht [facility] 09/02/2016 10:58 AM

## 2016-09-16 ENCOUNTER — Other Ambulatory Visit: Payer: Self-pay

## 2016-09-16 MED ORDER — PROCTOFOAM HC 1-1 % RE FOAM: Freq: Three times a day (TID) | RECTAL | 0 refills | Status: DC

## 2016-09-16 NOTE — Telephone Encounter (Signed)
Patient requesting refill of Proctofoam to [facility].

## 2016-09-26 ENCOUNTER — Ambulatory Visit (INDEPENDENT_AMBULATORY_CARE_PROVIDER_SITE_OTHER): Payer: 59 | Admitting: Family Medicine

## 2016-09-26 ENCOUNTER — Encounter: Payer: Self-pay | Admitting: Family Medicine

## 2016-09-26 VITALS — BP 118/68 | HR 73 | Temp 97.9°F | Resp 18 | Ht 66.0 in | Wt 122.6 lb

## 2016-09-26 DIAGNOSIS — F419 Anxiety disorder, unspecified: Secondary | ICD-10-CM

## 2016-09-26 DIAGNOSIS — R1310 Dysphagia, unspecified: Secondary | ICD-10-CM | POA: Diagnosis not present

## 2016-09-26 DIAGNOSIS — G4700 Insomnia, unspecified: Secondary | ICD-10-CM

## 2016-09-26 DIAGNOSIS — K644 Residual hemorrhoidal skin tags: Secondary | ICD-10-CM | POA: Insufficient documentation

## 2016-09-26 DIAGNOSIS — J4540 Moderate persistent asthma, uncomplicated: Secondary | ICD-10-CM | POA: Diagnosis not present

## 2016-09-26 DIAGNOSIS — K219 Gastro-esophageal reflux disease without esophagitis: Secondary | ICD-10-CM | POA: Diagnosis not present

## 2016-09-26 DIAGNOSIS — N760 Acute vaginitis: Secondary | ICD-10-CM | POA: Diagnosis not present

## 2016-09-26 DIAGNOSIS — G43009 Migraine without aura, not intractable, without status migrainosus: Secondary | ICD-10-CM | POA: Diagnosis not present

## 2016-09-26 DIAGNOSIS — K5909 Other constipation: Secondary | ICD-10-CM

## 2016-09-26 DIAGNOSIS — E538 Deficiency of other specified B group vitamins: Secondary | ICD-10-CM

## 2016-09-26 DIAGNOSIS — R1319 Other dysphagia: Secondary | ICD-10-CM

## 2016-09-26 MED ORDER — CYANOCOBALAMIN 1000 MCG/ML IJ SOLN
1000.0000 ug | Freq: Once | INTRAMUSCULAR | Status: AC
  Administered 2016-09-26: 1000 ug via INTRAMUSCULAR

## 2016-09-26 MED ORDER — RANITIDINE HCL 150 MG PO TABS: 150.0000 mg | ORAL_TABLET | Freq: Every day | ORAL | 2 refills | Status: DC

## 2016-09-26 MED ORDER — ZOLPIDEM TARTRATE 5 MG PO TABS: 5.0000 mg | ORAL_TABLET | Freq: Every evening | ORAL | 2 refills | Status: DC | PRN

## 2016-09-26 MED ORDER — FLUCONAZOLE 150 MG PO TABS: 150.0000 mg | ORAL_TABLET | ORAL | 2 refills | Status: DC

## 2016-09-26 MED ORDER — LINACLOTIDE 72 MCG PO CAPS: 72.0000 ug | ORAL_CAPSULE | Freq: Every day | ORAL | 2 refills | Status: DC

## 2016-09-26 MED ORDER — ALPRAZOLAM 0.5 MG PO TABS: 0.5000 mg | ORAL_TABLET | Freq: Two times a day (BID) | ORAL | 0 refills | Status: DC | PRN

## 2016-09-26 NOTE — Progress Notes (Signed)
Name: Jessica Carroll   MRN: [id number]    DOB: 1971/05/26   Date:09/26/2016       Progress Note  Subjective  Chief Complaint  Chief Complaint  Patient presents with  . Medication Refill    HPI  Asthma: she has noticed some SOB at rest, needs to take a deep breath, also has coughing spells at night and wakes up a couple times a week because of it. She is compliant with Advair, used rescue inhaler about two to three times a month and 30 minutes prior to activity. She has not raised the head of her bed. She states that over the past three month GERD has been much worse and that is when the cough started at night   09/26/16 0804  Asthma History  Symptoms >2 days/week  Nighttime Awakenings >1/wk but not nightly  Asthma interference with normal activity No limitations  SABA use (not for EIB) 0-2 days/wk  Risk: Exacerbations requiring oral systemic steroids 0-1 / year  Asthma Severity Moderate Persistent   GERD/dysphagia: she has noticed nausea, epigastric multiple times a week, dysphagia for solids, taking Dexilant but is not controlling her symptoms. No weight loss.   Hemorrhoids: she has noticed bulging of hemorrhoids, there is no bleeding but she has intermittent itching, uses otc medication with some improvement  Migraine: resolved, no recent episodes, does not need refill of medication  Recurrent vaginitis: controlled with diflucan weekly, currently no vaginal discharge or itching  Chronic constipation: she states she has daily bowel movement with medication, however has to rush and it is liquid stools, no blood or mucus. We will try going down on dose  Anemia: mild, normal iron storage, possibly from B12 deficiency, continue supplementation   Patient Active Problem List   Diagnosis Date Noted  . Hemorrhoids, external without complications 28/31/5176  . Nausea in adult patient 02/24/2016  . Endometriosis determined by laparoscopy 01/25/2016  . Dysmenorrhea 12/30/2015  .  Dyspareunia, female 12/30/2015  . Chronic constipation 08/14/2015  . Yeast vaginitis 08/14/2015  . Bilateral ganglion cysts of wrists 08/14/2015  . Mild mitral regurgitation 05/19/2015  . B12 deficiency 05/18/2015  . Asthma, moderate 05/18/2015  . Anxiety 05/18/2015  . Insomnia, persistent 05/18/2015  . Eczema of hand 05/18/2015  . Viral keratitis 05/18/2015  . Migraine without aura and without status migrainosus, not intractable 05/18/2015  . Numerous moles 05/18/2015  . Allergic rhinitis 05/18/2015  . History of corneal transplant 05/18/2015  . Gastroesophageal reflux disease without esophagitis 12/16/2009    Past Surgical History:  Procedure Laterality Date  . CORNEAL TRANSPLANT Right 01/07/2013  . eyelid surgery Right June 19, 2015   [facility] by Dr. Norlene Duel  . LAPAROSCOPIC BILATERAL SALPINGECTOMY N/A 01/25/2016   Procedure: LAPAROSCOPIC BILATERAL SALPINGECTOMY, ADHESIOLYSIS, LAPAROSCOPIC EXCISION/FULGERATION ENDOMETRIOSIS ;  Surgeon: [name], MD;  Location: [facility] ORS;  Service: Gynecology;  Laterality: N/A;  . Laparoscopic excision and fulguration of endometriosis N/A    bladder flap and uterosacral ligament endometriosis, excised and cauterized  . UPPER GI ENDOSCOPY  07/19/2012    Family History  Problem Relation Age of Onset  . Adopted: Yes  . Hyperlipidemia Mother   . Hypertension Mother   . Diabetes Father   . Heart failure Father 50    hear attack  . Cancer Neg Hx     Social History   Social History  . Marital status: Single    Spouse name: N/A  . Number of children: N/A  . Years of  education: 12   Occupational History  . textile    Social History Main Topics  . Smoking status: Never Smoker  . Smokeless tobacco: Never Used  . Alcohol use 0.0 oz/week     Comment: occ. wine  . Drug use: No  . Sexual activity: Yes    Partners: Male    Birth control/ protection: None, Surgical   Other Topics Concern  . Not on file   Social History  Narrative  . No narrative on file     Current Outpatient Prescriptions:  .  albuterol (PROVENTIL HFA;VENTOLIN HFA) 108 (90 Base) MCG/ACT inhaler, Inhale 2 puffs into the lungs as needed., Disp: 1 Inhaler, Rfl: 0 .  ALPRAZolam (XANAX) 0.5 MG tablet, Take 1 tablet (0.5 mg total) by mouth 2 (two) times daily as needed for anxiety., Disp: 30 tablet, Rfl: 0 .  DEXILANT 60 MG capsule, Take 1 capsule (60 mg total) by mouth daily., Disp: 30 capsule, Rfl: 5 .  fluconazole (DIFLUCAN) 150 MG tablet, Take 1 tablet (150 mg total) by mouth once a week., Disp: 4 tablet, Rfl: 2 .  Fluticasone-Salmeterol (ADVAIR) 250-50 MCG/DOSE AEPB, Inhale 2 puffs into the lungs 2 (two) times daily., Disp: 60 each, Rfl: 5 .  ibuprofen (ADVIL,MOTRIN) 800 MG tablet, TAKE 1 TABLET(800 MG) BY MOUTH THREE TIMES DAILY AS NEEDED, Disp: 90 tablet, Rfl: 0 .  linaclotide (LINZESS) 72 MCG capsule, Take 1 capsule (72 mcg total) by mouth daily before breakfast., Disp: 30 capsule, Rfl: 2 .  MULTIPLE VITAMIN PO, Take 1 tablet by mouth daily., Disp: , Rfl:  .  prednisoLONE acetate (PRED FORTE) 1 % ophthalmic suspension, Administer 1 drop to the right eye daily., Disp: , Rfl:  .  ranitidine (ZANTAC) 150 MG tablet, Take 1 tablet (150 mg total) by mouth at bedtime., Disp: 30 tablet, Rfl: 2 .  rizatriptan (MAXALT) 10 MG tablet, Take 1 tablet (10 mg total) by mouth 2 (two) times daily as needed., Disp: 10 tablet, Rfl: 2 .  triamcinolone cream (KENALOG) 0.1 %, 1 application as needed., Disp: , Rfl:  .  valACYclovir (VALTREX) 500 MG tablet, Take 1 tablet by mouth 2 (two) times daily., Disp: , Rfl: 0 .  zolpidem (AMBIEN) 5 MG tablet, Take 1 tablet (5 mg total) by mouth at bedtime as needed for sleep., Disp: 30 tablet, Rfl: 2  Allergies  Allergen Reactions  . Penicillins Nausea Only     ROS  Constitutional: Negative for fever or weight change.  Respiratory: Positive for cough and shortness of breath.   Cardiovascular: Negative for chest pain  or palpitations.  Gastrointestinal: positive  for epigastric  pain, no bowel changes.  Musculoskeletal: Negative for gait problem or joint swelling.  Skin: Negative for rash.   Neurological: Negative for dizziness or headache.  No other specific complaints in a complete review of systems (except as listed in HPI above).  Objective  Vitals:   09/26/16 0755  BP: 118/68  Pulse: 73  Resp: 18  Temp: 97.9 F (36.6 C)  TempSrc: Oral  SpO2: 96%  Weight: 122 lb 9.6 oz (55.6 kg)  Height: '5\' 6"'  (1.676 m)    Body mass index is 19.79 kg/m.  Physical Exam  Constitutional: Patient appears well-developed and well-nourished.No distress.  HEENT: head atraumatic, normocephalic, pupils equal and reactive to light, neck supple, throat within normal limits Cardiovascular: Normal rate, regular rhythm and normal heart sounds.  No murmur heard. No BLE edema. Pulmonary/Chest: Effort normal and breath sounds normal. No respiratory  distress. Abdominal: Soft.  There is no tenderness. Psychiatric: Patient has a normal mood and affect. behavior is normal. Judgment and thought content normal.  Recent Results (from the past 2160 hour(s))  Pap IG, CT/NG NAA, and HPV (high risk)     Status: None   Collection Time: 08/17/16 12:00 AM  Result Value Ref Range   HPV DNA High Risk Not Detected     Comment: HIGH RISK HPV types (16,18,31,33,35,39,45,51,52,56,58,59,66,68) were not detected. Other HPV types which cause anogenital lesions may be present. The significance of the other types of HPV in malignant  processes has not been established.                  ** Normal Reference Range: Not Detected **      HPV High Risk testing performed using the APTIMA HPV mRNA Assay.      Specimen adequacy:      Comment: SATISFACTORY.  Endocervical/transformation zone component present.   FINAL DIAGNOSIS:      Comment: - NEGATIVE FOR INTRAEPITHELIAL LESIONS OR MALIGNANCY.    COMMENTS:      Comment: This Pap test has  been evaluated with computer assisted technology.   Cytotechnologist:      Comment: EW, BA CT(ASCP)   Chlamydia Probe Amp NOT DETECTED     Comment:                    **Normal Reference Range: NOT DETECTED**   This test was performed using the APTIMA COMBO2 Assay ([place]   The analytical performance characteristics of this assay, when used to test SurePath specimens have been determined by [facility]      GC Probe Amp NOT DETECTED     Comment:                    **Normal Reference Range: NOT DETECTED**   This test was performed using the APTIMA COMBO2 Assay ([place]   The analytical performance characteristics of this assay, when used to test SurePath specimens have been determined by [employer]   *  The Pap is a screening test for cervical cancer. It is not a  diagnostic test and is subject to false negative and false positive  results. It is most reliable when a satisfactory sample, regularly  obtained, is submitted with relevant clinical findings and history,  and when the Pap result is evaluated along with historic and current  clinical information.   Lipid panel     Status: None   Collection Time: 08/17/16  9:28 AM  Result Value Ref Range   Cholesterol 179 125 - 200 mg/dL   Triglycerides 58 <150 mg/dL   HDL 66 >=46 mg/dL   Total CHOL/HDL Ratio 2.7 <=5.0 Ratio   VLDL 12 <30 mg/dL   LDL Cholesterol 101 <130 mg/dL    Comment:   Total Cholesterol/HDL Ratio:CHD Risk                        Coronary Heart Disease Risk Table                                        Men       Women          1/2 Average Risk              3.4  3.3              Average Risk              5.0        4.4           2X Average Risk              9.6        7.1           3X Average Risk             23.4       11.0 Use the calculated Patient Ratio above and the CHD Risk table  to determine the patient's CHD Risk.   COMPLETE METABOLIC PANEL WITH GFR     Status:  None   Collection Time: 08/17/16  9:28 AM  Result Value Ref Range   Sodium 139 135 - 146 mmol/L   Potassium 4.1 3.5 - 5.3 mmol/L   Chloride 104 98 - 110 mmol/L   CO2 26 20 - 31 mmol/L   Glucose, Bld 84 65 - 99 mg/dL   BUN 14 7 - 25 mg/dL   Creat 0.67 0.50 - 1.10 mg/dL   Total Bilirubin 0.8 0.2 - 1.2 mg/dL   Alkaline Phosphatase 44 33 - 115 U/L   AST 16 10 - 35 U/L   ALT 10 6 - 29 U/L   Total Protein 7.2 6.1 - 8.1 g/dL   Albumin 4.3 3.6 - 5.1 g/dL   Calcium 9.1 8.6 - 10.2 mg/dL   GFR, Est African American >89 >=60 mL/min   GFR, Est Non African American >89 >=60 mL/min  Hemoglobin A1c     Status: None   Collection Time: 08/17/16  9:28 AM  Result Value Ref Range   Hgb A1c MFr Bld 5.1 <5.7 %    Comment:   For the purpose of screening for the presence of diabetes:   <5.7%       Consistent with the absence of diabetes 5.7-6.4 %   Consistent with increased risk for diabetes (prediabetes) >=6.5 %     Consistent with diabetes   This assay result is consistent with a decreased risk of diabetes.   Currently, no consensus exists regarding use of hemoglobin A1c for diagnosis of diabetes in children.   According to American Diabetes Association (ADA) guidelines, hemoglobin A1c <7.0% represents optimal control in non-pregnant diabetic patients. Different metrics may apply to specific patient populations. Standards of Medical Care in Diabetes (ADA).      Mean Plasma Glucose 100 mg/dL  CBC with Differential/Platelet     Status: Abnormal   Collection Time: 08/17/16  9:28 AM  Result Value Ref Range   WBC 7.6 3.8 - 10.8 K/uL   RBC 3.85 3.80 - 5.10 MIL/uL   Hemoglobin 11.5 (L) 11.7 - 15.5 g/dL   HCT 36.2 35.0 - 45.0 %   MCV 94.0 80.0 - 100.0 fL   MCH 29.9 27.0 - 33.0 pg   MCHC 31.8 (L) 32.0 - 36.0 g/dL   RDW 13.0 11.0 - 15.0 %   Platelets 215 140 - 400 K/uL   MPV 12.1 7.5 - 12.5 fL   Neutro Abs 5,016 1,500 - 7,800 cells/uL   Lymphs Abs 2,052 850 - 3,900 cells/uL   Monocytes  Absolute 380 200 - 950 cells/uL   Eosinophils Absolute 76 15 - 500 cells/uL   Basophils Absolute 76 0 - 200 cells/uL   Neutrophils Relative % 66 %  Lymphocytes Relative 27 %   Monocytes Relative 5 %   Eosinophils Relative 1 %   Basophils Relative 1 %   Smear Review Criteria for review not met   TSH     Status: None   Collection Time: 08/17/16  9:28 AM  Result Value Ref Range   TSH 0.85 mIU/L    Comment:   Reference Range   > or = 20 Years  0.40-4.50   Pregnancy Range First trimester  0.26-2.66 Second trimester 0.55-2.73 Third trimester  0.43-2.91     Vitamin B12     Status: None   Collection Time: 08/17/16  9:28 AM  Result Value Ref Range   Vitamin B-12 302 200 - 1,100 pg/mL  VITAMIN D 25 Hydroxy (Vit-D Deficiency, Fractures)     Status: None   Collection Time: 08/17/16  9:28 AM  Result Value Ref Range   Vit D, 25-Hydroxy 36 30 - 100 ng/mL    Comment: Vitamin D Status           25-OH Vitamin D        Deficiency                <20 ng/mL        Insufficiency         20 - 29 ng/mL        Optimal             > or = 30 ng/mL   For 25-OH Vitamin D testing on patients on D2-supplementation and patients for whom quantitation of D2 and D3 fractions is required, the [facility] 25-OH VIT D, (D2,D3), LC/MS/MS is recommended: order code [id number] (patients > 2 yrs).   Iron, TIBC and Ferritin Panel     Status: None   Collection Time: 08/17/16  9:28 AM  Result Value Ref Range   Ferritin 41 10 - 232 ng/mL   Iron 56 40 - 190 ug/dL   TIBC 356 250 - 450 ug/dL   %SAT 16 11 - 50 %  Iron, TIBC and Ferritin Panel     Status: None   Collection Time: 08/22/16  9:31 AM  Result Value Ref Range   Ferritin SEE NOTE 10 - 232 ng/mL    Comment: Test cancelled per client request.      Iron SEE NOTE 40 - 190 ug/dL    Comment: Test cancelled per client request.      TIBC CANCELED 250 - 450 ug/dL    Comment: Result canceled by the ancillary   %SAT CANCELED 11 - 50 %    Comment: Result  canceled by the ancillary      PHQ2/9: Depression screen [facility] 2/9 09/02/2016 06/20/2016 03/17/2016 08/14/2015 05/13/2015  Decreased Interest 0 0 0 0 2  Down, Depressed, Hopeless 0 0 0 0 1  PHQ - 2 Score 0 0 0 0 3  Altered sleeping - - - - 1  Tired, decreased energy - - - - 3  Change in appetite - - - - 2  Feeling bad or failure about yourself  - - - - 0  Trouble concentrating - - - - 0  Moving slowly or fidgety/restless - - - - 1  Suicidal thoughts - - - - 0  PHQ-9 Score - - - - 10  Difficult doing work/chores - - - - Somewhat difficult     Fall Risk: Fall Risk  09/02/2016 06/20/2016 03/17/2016 12/17/2015 08/14/2015  Falls in the past year? No No No No No  Risk for fall due to : - - - - -       Assessment & Plan  1. Gastro-esophageal reflux disease without esophagitis  - ranitidine (ZANTAC) 150 MG tablet; Take 1 tablet (150 mg total) by mouth at bedtime.  Dispense: 30 tablet; Refill: 2 - Ambulatory referral to Gastroenterology  2. Recurrent vaginitis  - fluconazole (DIFLUCAN) 150 MG tablet; Take 1 tablet (150 mg total) by mouth once a week.  Dispense: 4 tablet; Refill: 2  3. Chronic constipation  Linzess dose will be decreased since she has liquid stools about one hour after taking medication  - linaclotide (LINZESS) 72 MCG capsule; Take 1 capsule (72 mcg total) by mouth daily before breakfast.  Dispense: 30 capsule; Refill: 2  4. Migraine without aura and without status migrainosus, not intractable  Doing well   5. Moderate persistent asthma without complication  - Spirometry: Peak  6. Insomnia, persistent  - zolpidem (AMBIEN) 5 MG tablet; Take 1 tablet (5 mg total) by mouth at bedtime as needed for sleep.  Dispense: 30 tablet; Refill: 2  7. Anxiety  - ALPRAZolam (XANAX) 0.5 MG tablet; Take 1 tablet (0.5 mg total) by mouth 2 (two) times daily as needed for anxiety.  Dispense: 30 tablet; Refill: 0  8. B12 deficiency  - cyanocobalamin ((VITAMIN B-12)) injection 1,000  mcg; Inject 1 mL (1,000 mcg total) into the muscle once.  9. Hemorrhoids, external without complications  - Ambulatory referral to Gastroenterology  10. Esophageal dysphagia  - Ambulatory referral to Gastroenterology

## 2016-10-25 ENCOUNTER — Other Ambulatory Visit: Payer: Self-pay | Admitting: Family Medicine

## 2016-10-25 ENCOUNTER — Encounter: Payer: Self-pay | Admitting: Gastroenterology

## 2016-10-25 ENCOUNTER — Other Ambulatory Visit: Payer: Self-pay

## 2016-10-25 ENCOUNTER — Ambulatory Visit (INDEPENDENT_AMBULATORY_CARE_PROVIDER_SITE_OTHER): Payer: 59 | Admitting: Gastroenterology

## 2016-10-25 VITALS — BP 98/61 | HR 67 | Temp 98.3°F | Ht 66.0 in | Wt 123.2 lb

## 2016-10-25 DIAGNOSIS — K219 Gastro-esophageal reflux disease without esophagitis: Secondary | ICD-10-CM

## 2016-10-25 DIAGNOSIS — R1084 Generalized abdominal pain: Secondary | ICD-10-CM

## 2016-10-25 DIAGNOSIS — R1011 Right upper quadrant pain: Secondary | ICD-10-CM | POA: Diagnosis not present

## 2016-10-25 NOTE — Telephone Encounter (Signed)
Patient requesting refill of Dexilant to [facility].  

## 2016-10-25 NOTE — Progress Notes (Signed)
Gastroenterology Consultation  Referring Provider:     Steele Sizer, MD Primary Care Physician:  Loistine Chance, MD Primary Gastroenterologist:  Dr. Allen Norris     Reason for Consultation:     Abdominal pain        HPI:   Jessica Carroll is a 45 y.o. y/o female referred for consultation & management of Abdominal pain by Dr. Loistine Chance, MD.  This patient comes in today with a history of abdominal pain. The patient states that she has been taking Linzess for constipation for probably 2 years and states that she has hemorrhoids from her constipation. The patient now comes in with pain in the epigastric area that she reports is not helped by Zantac or by Dexilant. The patient denies any unexplained weight loss, fevers, chills or vomiting. The patient does report that she has nausea with her pain. Despite reporting epigastric pain she points to her right upper quadrant when asked where her pain is. It is no family history of colon cancer colon polyps. The patient has also not had any history of an EGD or colonoscopy in the past.  Past Medical History:  Diagnosis Date  . Anxiety   . Asthma 2011  . Diffuse cystic mastopathy   . GERD (gastroesophageal reflux disease)   . Headache     Past Surgical History:  Procedure Laterality Date  . CORNEAL TRANSPLANT Right 01/07/2013  . eyelid surgery Right June 19, 2015   [facility] by Dr. Norlene Duel  . LAPAROSCOPIC BILATERAL SALPINGECTOMY N/A 01/25/2016   Procedure: LAPAROSCOPIC BILATERAL SALPINGECTOMY, ADHESIOLYSIS, LAPAROSCOPIC EXCISION/FULGERATION ENDOMETRIOSIS ;  Surgeon: [name], MD;  Location: [facility] ORS;  Service: Gynecology;  Laterality: N/A;  . Laparoscopic excision and fulguration of endometriosis N/A    bladder flap and uterosacral ligament endometriosis, excised and cauterized  . UPPER GI ENDOSCOPY  07/19/2012    Prior to Admission medications   Medication Sig Start Date End Date Taking? Authorizing Provider  albuterol  (PROVENTIL HFA;VENTOLIN HFA) 108 (90 Base) MCG/ACT inhaler Inhale 2 puffs into the lungs as needed. 03/17/16  Yes Steele Sizer, MD  ALPRAZolam Duanne Moron) 0.5 MG tablet Take 1 tablet (0.5 mg total) by mouth 2 (two) times daily as needed for anxiety. 09/26/16  Yes Steele Sizer, MD  DEXILANT 60 MG capsule Take 1 capsule (60 mg total) by mouth daily. 08/17/16  Yes Steele Sizer, MD  Fluticasone-Salmeterol (ADVAIR) 250-50 MCG/DOSE AEPB Inhale 2 puffs into the lungs 2 (two) times daily. 06/20/16  Yes Steele Sizer, MD  ibuprofen (ADVIL,MOTRIN) 800 MG tablet TAKE 1 TABLET(800 MG) BY MOUTH THREE TIMES DAILY AS NEEDED 07/23/16  Yes Steele Sizer, MD  linaclotide [facility]) 72 MCG capsule Take 1 capsule (72 mcg total) by mouth daily before breakfast. 09/26/16  Yes Steele Sizer, MD  MULTIPLE VITAMIN PO Take 1 tablet by mouth daily.   Yes Historical Provider, MD  prednisoLONE acetate (PRED FORTE) 1 % ophthalmic suspension Administer 1 drop to the right eye daily. 10/05/15  Yes Historical Provider, MD  ranitidine (ZANTAC) 150 MG tablet Take 1 tablet (150 mg total) by mouth at bedtime. 09/26/16  Yes Steele Sizer, MD  rizatriptan (MAXALT) 10 MG tablet Take 1 tablet (10 mg total) by mouth 2 (two) times daily as needed. 08/14/15  Yes Steele Sizer, MD  triamcinolone cream (KENALOG) 0.1 % 1 application as needed. 10/21/13  Yes Historical Provider, MD  valACYclovir (VALTREX) 500 MG tablet Take 1 tablet by mouth 2 (two) times daily. 11/30/15  Yes Historical Provider, MD  zolpidem (AMBIEN) 5 MG tablet Take 1 tablet (5 mg total) by mouth at bedtime as needed for sleep. 09/26/16  Yes Steele Sizer, MD    Family History  Problem Relation Age of Onset  . Adopted: Yes  . Hyperlipidemia Mother   . Hypertension Mother   . Diabetes Father   . Heart failure Father 57    hear attack  . Cancer Neg Hx      Social History  Substance Use Topics  . Smoking status: Never Smoker  . Smokeless tobacco: Never Used  . Alcohol  use 0.0 oz/week     Comment: occ. wine    Allergies as of 10/25/2016 - Review Complete 10/25/2016  Allergen Reaction Noted  . Penicillins Nausea Only 03/31/2015    Review of Systems:    All systems reviewed and negative except where noted in HPI.   Physical Exam:  BP 98/61   Pulse 67   Temp 98.3 F (36.8 C) (Oral)   Ht 5\' 6"  (1.676 m)   Wt 123 lb 3.2 oz (55.9 kg)   BMI 19.89 kg/m  No LMP recorded. Psych:  Alert and cooperative. Normal mood and affect. General:   Alert,  Well-developed, well-nourished, pleasant and cooperative in NAD Head:  Normocephalic and atraumatic. Eyes:  Sclera clear, no icterus.   Conjunctiva pink. Ears:  Normal auditory acuity. Nose:  No deformity, discharge, or lesions. Mouth:  No deformity or lesions,oropharynx pink & moist. Neck:  Supple; no masses or thyromegaly. Lungs:  Respirations even and unlabored.  Clear throughout to auscultation.   No wheezes, crackles, or rhonchi. No acute distress. Heart:  Regular rate and rhythm; no murmurs, clicks, rubs, or gallops. Abdomen:  Normal bowel sounds.  No bruits.  Soft, positive right upper quadrant tenderness and questionable positive Murphy sign, non-distended without masses, hepatosplenomegaly or hernias noted.  No guarding or rebound tenderness.  Negative Carnett sign.   Rectal:  Deferred.  Msk:  Symmetrical without gross deformities.  Good, equal movement & strength bilaterally. Pulses:  Normal pulses noted. Extremities:  No clubbing or edema.  No cyanosis. Neurologic:  Alert and oriented x3;  grossly normal neurologically. Skin:  Intact without significant lesions or rashes.  No jaundice. Lymph Nodes:  No significant cervical adenopathy. Psych:  Alert and cooperative. Normal mood and affect.  Imaging Studies: No results found.  Assessment and Plan:   Jessica Carroll is a 45 y.o. y/o female who comes in today with a report of abdominal pain. The patient states that in the middle but on physical  exam is clearly in the right upper quadrant with a questionable positive Murphy sign. The patient will be set up for a right upper quadrant ultrasound and a gallbladder emptying study. The patient has been explained the plan and agrees with it.  Lucilla Lame, MD. Marval Regal   Note: This dictation was prepared with Dragon dictation along with smaller phrase technology. Any transcriptional errors that result from this process are unintentional.

## 2016-11-14 ENCOUNTER — Other Ambulatory Visit: Payer: Self-pay | Admitting: Family Medicine

## 2016-11-14 ENCOUNTER — Ambulatory Visit: Payer: 59

## 2016-11-14 DIAGNOSIS — K219 Gastro-esophageal reflux disease without esophagitis: Secondary | ICD-10-CM

## 2016-11-14 MED ORDER — DEXILANT 60 MG PO CPDR: DELAYED_RELEASE_CAPSULE | ORAL | 2 refills | Status: DC

## 2016-11-17 ENCOUNTER — Ambulatory Visit: Payer: 59 | Admitting: Obstetrics and Gynecology

## 2016-11-29 ENCOUNTER — Encounter: Payer: Self-pay | Admitting: Family Medicine

## 2016-11-30 ENCOUNTER — Other Ambulatory Visit: Payer: Self-pay | Admitting: Family Medicine

## 2016-11-30 MED ORDER — HYDROCORTISONE ACE-PRAMOXINE 1-1 % RE FOAM: 1.0000 | Freq: Two times a day (BID) | RECTAL | 0 refills | Status: DC

## 2016-12-07 ENCOUNTER — Ambulatory Visit (INDEPENDENT_AMBULATORY_CARE_PROVIDER_SITE_OTHER): Payer: 59

## 2016-12-07 DIAGNOSIS — E538 Deficiency of other specified B group vitamins: Secondary | ICD-10-CM

## 2016-12-07 MED ORDER — CYANOCOBALAMIN 1000 MCG/ML IJ SOLN
1000.0000 ug | Freq: Once | INTRAMUSCULAR | Status: AC
  Administered 2016-12-07: 1000 ug via INTRAMUSCULAR

## 2016-12-20 ENCOUNTER — Ambulatory Visit: Payer: 59 | Admitting: Obstetrics and Gynecology

## 2016-12-25 ENCOUNTER — Other Ambulatory Visit: Payer: Self-pay | Admitting: Family Medicine

## 2016-12-25 DIAGNOSIS — K5909 Other constipation: Secondary | ICD-10-CM

## 2016-12-26 ENCOUNTER — Other Ambulatory Visit: Payer: Self-pay | Admitting: Family Medicine

## 2016-12-26 DIAGNOSIS — Z1231 Encounter for screening mammogram for malignant neoplasm of breast: Secondary | ICD-10-CM

## 2016-12-26 NOTE — Telephone Encounter (Signed)
Patient requesting refill of Linzess to [facility].

## 2016-12-28 ENCOUNTER — Ambulatory Visit: Payer: 59 | Admitting: Family Medicine

## 2017-01-09 ENCOUNTER — Encounter: Payer: Self-pay | Admitting: Family Medicine

## 2017-01-09 ENCOUNTER — Ambulatory Visit (INDEPENDENT_AMBULATORY_CARE_PROVIDER_SITE_OTHER): Payer: 59 | Admitting: Family Medicine

## 2017-01-09 VITALS — BP 108/60 | HR 84 | Temp 97.4°F | Resp 16 | Ht 66.0 in | Wt 120.0 lb

## 2017-01-09 DIAGNOSIS — K5909 Other constipation: Secondary | ICD-10-CM | POA: Diagnosis not present

## 2017-01-09 DIAGNOSIS — R61 Generalized hyperhidrosis: Secondary | ICD-10-CM | POA: Diagnosis not present

## 2017-01-09 DIAGNOSIS — L308 Other specified dermatitis: Secondary | ICD-10-CM | POA: Diagnosis not present

## 2017-01-09 DIAGNOSIS — G4700 Insomnia, unspecified: Secondary | ICD-10-CM

## 2017-01-09 DIAGNOSIS — F419 Anxiety disorder, unspecified: Secondary | ICD-10-CM | POA: Diagnosis not present

## 2017-01-09 DIAGNOSIS — N760 Acute vaginitis: Secondary | ICD-10-CM | POA: Diagnosis not present

## 2017-01-09 DIAGNOSIS — N898 Other specified noninflammatory disorders of vagina: Secondary | ICD-10-CM

## 2017-01-09 DIAGNOSIS — K644 Residual hemorrhoidal skin tags: Secondary | ICD-10-CM | POA: Diagnosis not present

## 2017-01-09 DIAGNOSIS — E538 Deficiency of other specified B group vitamins: Secondary | ICD-10-CM

## 2017-01-09 DIAGNOSIS — R110 Nausea: Secondary | ICD-10-CM

## 2017-01-09 DIAGNOSIS — G43009 Migraine without aura, not intractable, without status migrainosus: Secondary | ICD-10-CM | POA: Diagnosis not present

## 2017-01-09 DIAGNOSIS — J4540 Moderate persistent asthma, uncomplicated: Secondary | ICD-10-CM | POA: Diagnosis not present

## 2017-01-09 DIAGNOSIS — K219 Gastro-esophageal reflux disease without esophagitis: Secondary | ICD-10-CM

## 2017-01-09 MED ORDER — FLUCONAZOLE 150 MG PO TABS: 150.0000 mg | ORAL_TABLET | ORAL | 0 refills | Status: DC

## 2017-01-09 MED ORDER — PROMETHAZINE HCL 12.5 MG PO TABS: 12.5000 mg | ORAL_TABLET | Freq: Three times a day (TID) | ORAL | 0 refills | Status: DC | PRN

## 2017-01-09 MED ORDER — TRIAMCINOLONE ACETONIDE 0.1 % EX CREA: 1.0000 "application " | TOPICAL_CREAM | Freq: Two times a day (BID) | CUTANEOUS | 0 refills | Status: DC | PRN

## 2017-01-09 MED ORDER — FLUTICASONE-SALMETEROL 250-50 MCG/DOSE IN AEPB: 2.0000 | INHALATION_SPRAY | Freq: Two times a day (BID) | RESPIRATORY_TRACT | 5 refills | Status: DC

## 2017-01-09 MED ORDER — ALPRAZOLAM 0.5 MG PO TABS: 0.5000 mg | ORAL_TABLET | Freq: Two times a day (BID) | ORAL | 0 refills | Status: DC | PRN

## 2017-01-09 MED ORDER — DEXILANT 60 MG PO CPDR: DELAYED_RELEASE_CAPSULE | ORAL | 2 refills | Status: DC

## 2017-01-09 MED ORDER — HYDROCORTISONE ACE-PRAMOXINE 1-1 % RE FOAM: 1.0000 | Freq: Two times a day (BID) | RECTAL | 0 refills | Status: DC

## 2017-01-09 MED ORDER — CYANOCOBALAMIN 1000 MCG/ML IJ SOLN
1000.0000 ug | Freq: Once | INTRAMUSCULAR | Status: AC
  Administered 2017-01-09: 1000 ug via INTRAMUSCULAR

## 2017-01-09 MED ORDER — RIZATRIPTAN BENZOATE 10 MG PO TABS: 10.0000 mg | ORAL_TABLET | Freq: Two times a day (BID) | ORAL | 2 refills | Status: DC | PRN

## 2017-01-09 NOTE — Progress Notes (Signed)
Name: Jessica Carroll   MRN: [id number]    DOB: 1971-03-19   Date:01/09/2017       Progress Note  Subjective  Chief Complaint  Chief Complaint  Patient presents with  . Asthma    controlled  . Gastroesophageal Reflux    still have the occ flare  . Migraine    controlled  . Constipation    controlled while on medication  . Hemorrhoids    patient stated that the problems persists  . Medication Refill  . Vaginal Pain    patient stated that she has been having problems for 3 days now, but has appt with gyn  . Orders    B12 injection    HPI  Asthma: she states SOB has resolved, but continues to have a dry cough at night, but down to a few times a week instead of every night. She has been taking Advair twice daily and Ventolin a couple times a week. Doing better  GERD/dysphagia: she states no longer having dysphagia, just nausea, occasionally upper abdominal pain, it does not seem to be related to meals. Seen by Dr. Durwin Reges and he suspects gallbladder disease, but she needs to contact insurance to find out about coverage.    Hemorrhoids: she has noticed bulging of hemorrhoids, there is no bleeding but she has intermittent itching, uses otc medication with some improvement but would like a refill of rx medication   Migraine: resolved, no recent episodes, she needs refill of Maxalt, rx has expired. Headache is described as throbbing, usually frontal or nuchal, associated with photophobia, and phonophobia. She has nausea, no vomiting with episodes.   Recurrent vaginitis: controlled with diflucan weekly, currently no vaginal discharge or itching, she states symptoms recurs if she skips medication   Chronic constipation: she states she has daily bowel movement with medication, doing better with lower dose of Linzess , no longer has to rush to use the bathroom.   Anxiety: she worries a lot and when very stressed it causes her chest to hurt and she needs to leave and take alprazolam, 30  pills lasts about 3 months, discussed to use only prn. Discussed SSRI to help with hot flashes and anxiety, but she states it affected her sex drive in the past.   Patient Active Problem List   Diagnosis Date Noted  . Hemorrhoids, external without complications 0000000  . Nausea in adult patient 02/24/2016  . Endometriosis determined by laparoscopy 01/25/2016  . Dysmenorrhea 12/30/2015  . Dyspareunia, female 12/30/2015  . Chronic constipation 08/14/2015  . Yeast vaginitis 08/14/2015  . Bilateral ganglion cysts of wrists 08/14/2015  . Mild mitral regurgitation 05/19/2015  . B12 deficiency 05/18/2015  . Asthma, moderate 05/18/2015  . Anxiety 05/18/2015  . Insomnia, persistent 05/18/2015  . Eczema of hand 05/18/2015  . Viral keratitis 05/18/2015  . Migraine without aura and without status migrainosus, not intractable 05/18/2015  . Numerous moles 05/18/2015  . Allergic rhinitis 05/18/2015  . History of corneal transplant 05/18/2015  . Gastroesophageal reflux disease without esophagitis 12/16/2009    Past Surgical History:  Procedure Laterality Date  . CORNEAL TRANSPLANT Right 01/07/2013  . eyelid surgery Right June 19, 2015   [facility] by Dr. Norlene Duel  . LAPAROSCOPIC BILATERAL SALPINGECTOMY N/A 01/25/2016   Procedure: LAPAROSCOPIC BILATERAL SALPINGECTOMY, ADHESIOLYSIS, LAPAROSCOPIC EXCISION/FULGERATION ENDOMETRIOSIS ;  Surgeon: [name], MD;  Location: [facility] ORS;  Service: Gynecology;  Laterality: N/A;  . Laparoscopic excision and fulguration of endometriosis N/A    bladder  flap and uterosacral ligament endometriosis, excised and cauterized  . UPPER GI ENDOSCOPY  07/19/2012    Family History  Problem Relation Age of Onset  . Adopted: Yes  . Hyperlipidemia Mother   . Hypertension Mother   . Diabetes Father   . Heart failure Father 45    hear attack  . Cancer Neg Hx     Social History   Social History  . Marital status: Single    Spouse name: N/A  .  Number of children: N/A  . Years of education: 53   Occupational History  . textile    Social History Main Topics  . Smoking status: Never Smoker  . Smokeless tobacco: Never Used  . Alcohol use 0.0 oz/week     Comment: occ. wine  . Drug use: No  . Sexual activity: Yes    Partners: Male    Birth control/ protection: None, Surgical   Other Topics Concern  . Not on file   Social History Narrative  . No narrative on file     Current Outpatient Prescriptions:  .  ALPRAZolam (XANAX) 0.5 MG tablet, Take 1 tablet (0.5 mg total) by mouth 2 (two) times daily as needed for anxiety., Disp: 30 tablet, Rfl: 0 .  DEXILANT 60 MG capsule, TAKE 1 CAPSULE(60 MG) BY MOUTH DAILY, Disp: 30 capsule, Rfl: 2 .  Fluticasone-Salmeterol (ADVAIR) 250-50 MCG/DOSE AEPB, Inhale 2 puffs into the lungs 2 (two) times daily., Disp: 60 each, Rfl: 5 .  hydrocortisone-pramoxine (PROCTOFOAM HC) rectal foam, Place 1 applicator rectally 2 (two) times daily., Disp: 10 g, Rfl: 0 .  ibuprofen (ADVIL,MOTRIN) 800 MG tablet, TAKE 1 TABLET(800 MG) BY MOUTH THREE TIMES DAILY AS NEEDED, Disp: 90 tablet, Rfl: 0 .  LINZESS 72 MCG capsule, TAKE 1 CAPSULE(72 MCG) BY MOUTH DAILY BEFORE BREAKFAST, Disp: 30 capsule, Rfl: 0 .  MULTIPLE VITAMIN PO, Take 1 tablet by mouth daily., Disp: , Rfl:  .  rizatriptan (MAXALT) 10 MG tablet, Take 1 tablet (10 mg total) by mouth 2 (two) times daily as needed., Disp: 10 tablet, Rfl: 2 .  triamcinolone cream (KENALOG) 0.1 %, Apply 1 application topically 2 (two) times daily as needed., Disp: 30 g, Rfl: 0 .  zolpidem (AMBIEN) 5 MG tablet, Take 1 tablet (5 mg total) by mouth at bedtime as needed for sleep., Disp: 30 tablet, Rfl: 2 .  albuterol (PROVENTIL HFA;VENTOLIN HFA) 108 (90 Base) MCG/ACT inhaler, Inhale 2 puffs into the lungs as needed. (Patient not taking: Reported on 01/09/2017), Disp: 1 Inhaler, Rfl: 0 .  fluconazole (DIFLUCAN) 150 MG tablet, Take 1 tablet (150 mg total) by mouth every other day.,  Disp: 3 tablet, Rfl: 0 .  valACYclovir (VALTREX) 500 MG tablet, Take 1 tablet by mouth 2 (two) times daily., Disp: , Rfl: 0  Allergies  Allergen Reactions  . Penicillins Nausea Only     ROS  Constitutional: Negative for fever or weight change.  Respiratory: Negative for cough and shortness of breath.   Cardiovascular: Negative for chest pain or palpitations.  Gastrointestinal: Negative for abdominal pain, no bowel changes.  She is having pelvic pain and will see Dr. Enzo Bi tomorrow.  Musculoskeletal: Negative for gait problem or joint swelling.  Skin: Positive  For intermittent hand  rash.  Neurological: Negative for dizziness or headache.  No other specific complaints in a complete review of systems (except as listed in HPI above).  Objective  Vitals:   01/09/17 1536  BP: 108/60  Pulse: 84  Resp:  16  Temp: 97.4 F (36.3 C)  TempSrc: Oral  SpO2: 99%  Weight: 120 lb (54.4 kg)  Height: 5\' 6"  (1.676 m)    Body mass index is 19.37 kg/m.  Physical Exam  Constitutional: Patient appears well-developed and well-nourished. Obese No distress.  HEENT: head atraumatic, normocephalic, pupils equal and reactive to light,  neck supple, throat within normal limits Cardiovascular: Normal rate, regular rhythm and normal heart sounds.  No murmur heard. No BLE edema. Pulmonary/Chest: Effort normal and breath sounds normal. No respiratory distress. Abdominal: Soft.  There is tenderness RUQ. Psychiatric: Patient has a normal mood and affect. behavior is normal. Judgment and thought content normal.   PHQ2/9: Depression screen [facility] 2/9 09/02/2016 06/20/2016 03/17/2016 08/14/2015 05/13/2015  Decreased Interest 0 0 0 0 2  Down, Depressed, Hopeless 0 0 0 0 1  PHQ - 2 Score 0 0 0 0 3  Altered sleeping - - - - 1  Tired, decreased energy - - - - 3  Change in appetite - - - - 2  Feeling bad or failure about yourself  - - - - 0  Trouble concentrating - - - - 0  Moving slowly or fidgety/restless  - - - - 1  Suicidal thoughts - - - - 0  PHQ-9 Score - - - - 10  Difficult doing work/chores - - - - Somewhat difficult     Fall Risk: Fall Risk  09/02/2016 06/20/2016 03/17/2016 12/17/2015 08/14/2015  Falls in the past year? No No No No No  Risk for fall due to : - - - - -    Assessment & Plan  1. Migraine without aura and without status migrainosus, not intractable  - rizatriptan (MAXALT) 10 MG tablet; Take 1 tablet (10 mg total) by mouth 2 (two) times daily as needed.  Dispense: 10 tablet; Refill: 2  2. B12 deficiency  -B12 today injection   3. Gastro-esophageal reflux disease without esophagitis  - DEXILANT 60 MG capsule; TAKE 1 CAPSULE(60 MG) BY MOUTH DAILY  Dispense: 30 capsule; Refill: 2  4. Chronic constipation  Doing well at this time   5. Night sweats  Likely from perimenopause, discussed less clothing for sleep and avoid blankets  6. Vaginal dryness  Likely perimenopausal, advised foreplay and lube   7. Insomnia, persistent  She takes Ambien prn   8. Moderate persistent asthma without complication  - Fluticasone-Salmeterol (ADVAIR) 250-50 MCG/DOSE AEPB; Inhale 2 puffs into the lungs 2 (two) times daily.  Dispense: 60 each; Refill: 5  9. Recurrent vaginitis  - fluconazole (DIFLUCAN) 150 MG tablet; Take 1 tablet (150 mg total) by mouth every other day.  Dispense: 3 tablet; Refill: 0  10. Anxiety  - ALPRAZolam (XANAX) 0.5 MG tablet; Take 1 tablet (0.5 mg total) by mouth 2 (two) times daily as needed for anxiety.  Dispense: 30 tablet; Refill: 0  11. Other eczema  - triamcinolone cream (KENALOG) 0.1 %; Apply 1 application topically 2 (two) times daily as needed.  Dispense: 30 g; Refill: 0  12. Hemorrhoids, external  - hydrocortisone-pramoxine (PROCTOFOAM HC) rectal foam; Place 1 applicator rectally 2 (two) times daily.  Dispense: 10 g; Refill: 0  13. Nausea  - promethazine (PHENERGAN) 12.5 MG tablet; Take 1 tablet (12.5 mg total) by mouth every 8 (eight)  hours as needed for nausea or vomiting.  Dispense: 20 tablet; Refill: 0

## 2017-01-10 ENCOUNTER — Encounter: Payer: Self-pay | Admitting: Obstetrics and Gynecology

## 2017-01-10 ENCOUNTER — Ambulatory Visit (INDEPENDENT_AMBULATORY_CARE_PROVIDER_SITE_OTHER): Payer: 59 | Admitting: Obstetrics and Gynecology

## 2017-01-10 VITALS — BP 97/59 | HR 67 | Ht 66.0 in | Wt 122.5 lb

## 2017-01-10 DIAGNOSIS — R102 Pelvic and perineal pain: Secondary | ICD-10-CM | POA: Diagnosis not present

## 2017-01-10 DIAGNOSIS — N760 Acute vaginitis: Secondary | ICD-10-CM | POA: Diagnosis not present

## 2017-01-10 DIAGNOSIS — N809 Endometriosis, unspecified: Secondary | ICD-10-CM

## 2017-01-10 LAB — POCT URINALYSIS DIPSTICK
Bilirubin, UA: NEGATIVE
Glucose, UA: NEGATIVE
Ketones, UA: NEGATIVE
LEUKOCYTES UA: NEGATIVE
NITRITE UA: NEGATIVE
PROTEIN UA: NEGATIVE
RBC UA: NEGATIVE
Spec Grav, UA: 1.01
Urobilinogen, UA: NEGATIVE
pH, UA: 6

## 2017-01-10 NOTE — Progress Notes (Signed)
GYN ENCOUNTER NOTE  Subjective:       Jessica Carroll is a 46 y.o. G0P0000 female is here for gynecologic evaluation of the following issues:  1. History of endometriosis 2. Vaginal pain-patient reports four-day history of shooting vaginal pain lasting seconds; no exacerbating or alleviating factors. Patient denies UTI symptoms. She is not experiencing any significant vaginal discharge..     Obstetric History OB History  Gravida Para Term Preterm AB Living  0 0 0 0 0 0  SAB TAB Ectopic Multiple Live Births  0 0 0 0          Past Medical History:  Diagnosis Date  . Anxiety   . Asthma 2011  . Diffuse cystic mastopathy   . GERD (gastroesophageal reflux disease)   . Headache     Past Surgical History:  Procedure Laterality Date  . CORNEAL TRANSPLANT Right 01/07/2013  . eyelid surgery Right June 19, 2015   [facility] by Dr. Norlene Duel  . LAPAROSCOPIC BILATERAL SALPINGECTOMY N/A 01/25/2016   Procedure: LAPAROSCOPIC BILATERAL SALPINGECTOMY, ADHESIOLYSIS, LAPAROSCOPIC EXCISION/FULGERATION ENDOMETRIOSIS ;  Surgeon: [name], MD;  Location: [facility] ORS;  Service: Gynecology;  Laterality: N/A;  . Laparoscopic excision and fulguration of endometriosis N/A    bladder flap and uterosacral ligament endometriosis, excised and cauterized  . UPPER GI ENDOSCOPY  07/19/2012    Current Outpatient Prescriptions on File Prior to Visit  Medication Sig Dispense Refill  . ALPRAZolam (XANAX) 0.5 MG tablet Take 1 tablet (0.5 mg total) by mouth 2 (two) times daily as needed for anxiety. 30 tablet 0  . DEXILANT 60 MG capsule TAKE 1 CAPSULE(60 MG) BY MOUTH DAILY 30 capsule 2  . fluconazole (DIFLUCAN) 150 MG tablet Take 1 tablet (150 mg total) by mouth every other day. 3 tablet 0  . Fluticasone-Salmeterol (ADVAIR) 250-50 MCG/DOSE AEPB Inhale 2 puffs into the lungs 2 (two) times daily. 60 each 5  . hydrocortisone-pramoxine (PROCTOFOAM HC) rectal foam Place 1 applicator rectally 2 (two) times  daily. 10 g 0  . ibuprofen (ADVIL,MOTRIN) 800 MG tablet TAKE 1 TABLET(800 MG) BY MOUTH THREE TIMES DAILY AS NEEDED 90 tablet 0  . LINZESS 72 MCG capsule TAKE 1 CAPSULE(72 MCG) BY MOUTH DAILY BEFORE BREAKFAST 30 capsule 0  . MULTIPLE VITAMIN PO Take 1 tablet by mouth daily.    . promethazine (PHENERGAN) 12.5 MG tablet Take 1 tablet (12.5 mg total) by mouth every 8 (eight) hours as needed for nausea or vomiting. 20 tablet 0  . rizatriptan (MAXALT) 10 MG tablet Take 1 tablet (10 mg total) by mouth 2 (two) times daily as needed. 10 tablet 2  . triamcinolone cream (KENALOG) 0.1 % Apply 1 application topically 2 (two) times daily as needed. 30 g 0  . valACYclovir (VALTREX) 500 MG tablet Take 1 tablet by mouth 2 (two) times daily.  0  . zolpidem (AMBIEN) 5 MG tablet Take 1 tablet (5 mg total) by mouth at bedtime as needed for sleep. 30 tablet 2   No current facility-administered medications on file prior to visit.     Allergies  Allergen Reactions  . Penicillins Nausea Only    Social History   Social History  . Marital status: Single    Spouse name: N/A  . Number of children: N/A  . Years of education: 74   Occupational History  . textile    Social History Main Topics  . Smoking status: Never Smoker  . Smokeless tobacco: Never Used  . Alcohol  use 0.0 oz/week     Comment: occ. wine  . Drug use: No  . Sexual activity: Yes    Partners: Male    Birth control/ protection: None, Surgical   Other Topics Concern  . Not on file   Social History Narrative  . No narrative on file    Family History  Problem Relation Age of Onset  . Adopted: Yes  . Hyperlipidemia Mother   . Hypertension Mother   . Diabetes Father   . Heart failure Father 80    hear attack  . Cancer Neg Hx     The following portions of the patient's history were reviewed and updated as appropriate: allergies, current medications, past family history, past medical history, past social history, past surgical history  and problem list.  Review of Systems Review of Systems - As per the history of present illness; comprehensive review of systems otherwise negative Objective:   BP (!) 97/59   Pulse 67   Ht 5\' 6"  (1.676 m)   Wt 122 lb 8 oz (55.6 kg)   LMP 12/18/2016 (Exact Date)   BMI 19.77 kg/m  CONSTITUTIONAL: Well-developed, well-nourished female in no acute distress.  HENT:  Normocephalic, atraumatic.  NECK: Normal range of motion, supple, no masses.  Normal thyroid.  SKIN: Skin is warm and dry. No rash noted. Not diaphoretic. No erythema. No pallor. Villar[place]: Alert and oriented to person, place, and time. PSYCHIATRIC: Normal mood and affect. Normal behavior. Normal judgment and thought content. CARDIOVASCULAR:Not Examined RESPIRATORY: Not Examined BREASTS: Not Examined ABDOMEN: Soft, non distended; Non tender.  No Organomegaly. PELVIC:  External Genitalia: Normal  BUS: Normal  Vagina: Good support; no lesions; minimal white secretions; no palpable masses or tenderness  Cervix: Surgically absent  Uterus: Surgically absent  Adnexa: Normal; no palpable masses or tenderness  RV: Normal external exam  Bladder: Nontender MUSCULOSKELETAL: Normal range of motion. No tenderness.  No cyanosis, clubbing, or edema.  PROCEDURE: Wet prep:  KOH-negative  Normal saline-negative   Assessment:   1. Vaginal pain, Unclear etiology, nonacute - POCT urinalysis dipstick - Urine culture  2. Endometriosis determined by laparoscopy  3. Recurrent vaginitis, unclear etiology, with negative wet prep - NuSwab BV and Candida, NAA     Plan:  1. Wet prep 2. Nuswab 3. Urinalysis with urine culture 4. Return if symptoms persist over the next 2 weeks  A total of 15 minutes were spent face-to-face with the patient during this encounter and over half of that time dealt with counseling and coordination of care.  [name], MD  Note: This dictation was prepared with Dragon dictation along with  smaller phrase technology. Any transcriptional errors that result from this process are unintentional.

## 2017-01-10 NOTE — Patient Instructions (Signed)
1. New swab is obtained to rule out Monilia vaginitis and bacterial vaginosis 2. Urinalysis and urine culture is obtained CIN 3. Return in several weeks vaginal pain symptoms persist

## 2017-01-12 LAB — NUSWAB BV AND CANDIDA, NAA
CANDIDA ALBICANS, NAA: NEGATIVE
CANDIDA GLABRATA, NAA: NEGATIVE

## 2017-01-12 LAB — URINE CULTURE: Organism ID, Bacteria: NO GROWTH

## 2017-01-22 ENCOUNTER — Other Ambulatory Visit: Payer: Self-pay | Admitting: Family Medicine

## 2017-01-22 DIAGNOSIS — K5909 Other constipation: Secondary | ICD-10-CM

## 2017-01-23 ENCOUNTER — Other Ambulatory Visit: Payer: Self-pay | Admitting: Family Medicine

## 2017-01-23 DIAGNOSIS — K5909 Other constipation: Secondary | ICD-10-CM

## 2017-01-23 MED ORDER — LINACLOTIDE 72 MCG PO CAPS: ORAL_CAPSULE | ORAL | 5 refills | Status: DC

## 2017-01-23 NOTE — Telephone Encounter (Signed)
Patient requesting refill of Linzess to [facility].

## 2017-01-24 ENCOUNTER — Encounter: Payer: Self-pay | Admitting: Family Medicine

## 2017-01-24 ENCOUNTER — Ambulatory Visit (INDEPENDENT_AMBULATORY_CARE_PROVIDER_SITE_OTHER): Payer: 59 | Admitting: Family Medicine

## 2017-01-24 VITALS — BP 106/64 | HR 63 | Temp 97.6°F | Resp 18 | Ht 66.0 in | Wt 124.0 lb

## 2017-01-24 DIAGNOSIS — M545 Low back pain, unspecified: Secondary | ICD-10-CM

## 2017-01-24 LAB — POCT URINALYSIS DIPSTICK
BILIRUBIN UA: NEGATIVE
Glucose, UA: NEGATIVE
KETONES UA: NEGATIVE
Leukocytes, UA: NEGATIVE
Nitrite, UA: NEGATIVE
PH UA: 6
Protein, UA: NEGATIVE
RBC UA: NEGATIVE
Spec Grav, UA: 1.015
Urobilinogen, UA: 0.2

## 2017-01-24 MED ORDER — METAXALONE 800 MG PO TABS: 800.0000 mg | ORAL_TABLET | Freq: Three times a day (TID) | ORAL | 1 refills | Status: DC

## 2017-01-24 MED ORDER — CYCLOBENZAPRINE HCL 10 MG PO TABS: 10.0000 mg | ORAL_TABLET | Freq: Every day | ORAL | 0 refills | Status: DC

## 2017-01-24 NOTE — Progress Notes (Addendum)
Name: Jessica Carroll   MRN: [id number]    DOB: Jun 17, 1971   Date:01/24/2017       Progress Note  Subjective  Chief Complaint  Chief Complaint  Patient presents with  . Back Pain    Onset- Sunday morning, lower back intermittently sharp pain.     HPI  Acute right low back pain: she lifted up a heavy box at work on Saturday and woke up the following morning with sharp sensation on right lower back, pain radiates to right buttocks. Improved with two doses of prednisone, also massage at home, and stretching. Worse when walking and sitting or standing for a prolonged period of time. Pain level is 3/10. No dysuria or urinary frequency, no hematuria, no fever or rashes. Normal appetite    Patient Active Problem List   Diagnosis Date Noted  . Vaginal pain 01/10/2017  . Recurrent vaginitis 01/10/2017  . Hemorrhoids, external without complications 0000000  . Nausea in adult patient 02/24/2016  . Endometriosis determined by laparoscopy 01/25/2016  . Dysmenorrhea 12/30/2015  . Dyspareunia, female 12/30/2015  . Chronic constipation 08/14/2015  . Yeast vaginitis 08/14/2015  . Bilateral ganglion cysts of wrists 08/14/2015  . Mild mitral regurgitation 05/19/2015  . B12 deficiency 05/18/2015  . Asthma, moderate 05/18/2015  . Anxiety 05/18/2015  . Insomnia, persistent 05/18/2015  . Eczema of hand 05/18/2015  . Viral keratitis 05/18/2015  . Migraine without aura and without status migrainosus, not intractable 05/18/2015  . Numerous moles 05/18/2015  . Allergic rhinitis 05/18/2015  . History of corneal transplant 05/18/2015  . Gastroesophageal reflux disease without esophagitis 12/16/2009    Past Surgical History:  Procedure Laterality Date  . CORNEAL TRANSPLANT Right 01/07/2013  . eyelid surgery Right June 19, 2015   [facility] by Dr. Norlene Duel  . LAPAROSCOPIC BILATERAL SALPINGECTOMY N/A 01/25/2016   Procedure: LAPAROSCOPIC BILATERAL SALPINGECTOMY, ADHESIOLYSIS, LAPAROSCOPIC  EXCISION/FULGERATION ENDOMETRIOSIS ;  Surgeon: [name], MD;  Location: [facility] ORS;  Service: Gynecology;  Laterality: N/A;  . Laparoscopic excision and fulguration of endometriosis N/A    bladder flap and uterosacral ligament endometriosis, excised and cauterized  . UPPER GI ENDOSCOPY  07/19/2012    Family History  Problem Relation Age of Onset  . Adopted: Yes  . Hyperlipidemia Mother   . Hypertension Mother   . Diabetes Father   . Heart failure Father 57    hear attack  . Cancer Neg Hx     Social History   Social History  . Marital status: Single    Spouse name: N/A  . Number of children: N/A  . Years of education: 18   Occupational History  . textile    Social History Main Topics  . Smoking status: Never Smoker  . Smokeless tobacco: Never Used  . Alcohol use 0.0 oz/week     Comment: occ. wine  . Drug use: No  . Sexual activity: Yes    Partners: Male    Birth control/ protection: None, Surgical   Other Topics Concern  . Not on file   Social History Narrative  . No narrative on file     Current Outpatient Prescriptions:  .  ALPRAZolam (XANAX) 0.5 MG tablet, Take 1 tablet (0.5 mg total) by mouth 2 (two) times daily as needed for anxiety., Disp: 30 tablet, Rfl: 0 .  DEXILANT 60 MG capsule, TAKE 1 CAPSULE(60 MG) BY MOUTH DAILY, Disp: 30 capsule, Rfl: 2 .  fluconazole (DIFLUCAN) 150 MG tablet, Take 1 tablet (150 mg total) by  mouth every other day., Disp: 3 tablet, Rfl: 0 .  Fluticasone-Salmeterol (ADVAIR) 250-50 MCG/DOSE AEPB, Inhale 2 puffs into the lungs 2 (two) times daily., Disp: 60 each, Rfl: 5 .  hydrocortisone-pramoxine (PROCTOFOAM HC) rectal foam, Place 1 applicator rectally 2 (two) times daily., Disp: 10 g, Rfl: 0 .  ibuprofen (ADVIL,MOTRIN) 800 MG tablet, TAKE 1 TABLET(800 MG) BY MOUTH THREE TIMES DAILY AS NEEDED, Disp: 90 tablet, Rfl: 0 .  linaclotide (LINZESS) 72 MCG capsule, TAKE 1 CAPSULE(72 MCG) BY MOUTH DAILY BEFORE BREAKFAST, Disp: 30  capsule, Rfl: 5 .  MULTIPLE VITAMIN PO, Take 1 tablet by mouth daily., Disp: , Rfl:  .  prednisoLONE acetate (PRED FORTE) 1 % ophthalmic suspension, , Disp: , Rfl:  .  promethazine (PHENERGAN) 12.5 MG tablet, Take 1 tablet (12.5 mg total) by mouth every 8 (eight) hours as needed for nausea or vomiting., Disp: 20 tablet, Rfl: 0 .  rizatriptan (MAXALT) 10 MG tablet, Take 1 tablet (10 mg total) by mouth 2 (two) times daily as needed., Disp: 10 tablet, Rfl: 2 .  triamcinolone cream (KENALOG) 0.1 %, Apply 1 application topically 2 (two) times daily as needed., Disp: 30 g, Rfl: 0 .  valACYclovir (VALTREX) 500 MG tablet, Take 1 tablet by mouth 2 (two) times daily., Disp: , Rfl: 0 .  zolpidem (AMBIEN) 5 MG tablet, Take 1 tablet (5 mg total) by mouth at bedtime as needed for sleep., Disp: 30 tablet, Rfl: 2 .  metaxalone (SKELAXIN) 800 MG tablet, Take 1 tablet (800 mg total) by mouth 3 (three) times daily., Disp: 30 tablet, Rfl: 1  Allergies  Allergen Reactions  . Penicillins Nausea Only     ROS  Ten systems reviewed and is negative except as mentioned in HPI   Objective  Vitals:   01/24/17 1422  BP: 106/64  Pulse: 63  Resp: 18  Temp: 97.6 F (36.4 C)  TempSrc: Oral  SpO2: 94%  Weight: 124 lb (56.2 kg)  Height: 5\' 6"  (1.676 m)    Body mass index is 20.01 kg/m.  Physical Exam  Constitutional: Patient appears well-developed and well-nourished.  No distress.  HEENT: head atraumatic, normocephalic, pupils equal and reactive to light, , neck supple, throat within normal limits Cardiovascular: Normal rate, regular rhythm and normal heart sounds.  No murmur heard. No BLE edema. Pulmonary/Chest: Effort normal and breath sounds normal. No respiratory distress. Abdominal: Soft.  There is no tenderness. Psychiatric: Patient has a normal mood and affect. behavior is normal. Judgment and thought content normal. Muscular Skeletal: pain during palpation right lower back and radiation to right  sciatic notch but not down leg, negative straight leg raise. Normal rom   Recent Results (from the past 2160 hour(s))  POCT urinalysis dipstick     Status: None   Collection Time: 01/10/17  3:37 PM  Result Value Ref Range   Color, UA yellow    Clarity, UA clear    Glucose, UA neg    Bilirubin, UA neg    Ketones, UA neg    Spec Grav, UA 1.010    Blood, UA neg    pH, UA 6.0    Protein, UA neg    Urobilinogen, UA negative    Nitrite, UA neg    Leukocytes, UA Negative Negative  NuSwab BV and Candida, NAA     Status: None   Collection Time: 01/10/17  3:52 PM  Result Value Ref Range   Atopobium vaginae Low - 0 Score   BVAB 2 Moderate -  1 Score   Megasphaera 1 Low - 0 Score    Comment: Calculate total score by adding the 3 individual bacterial vaginosis (BV) marker scores together.  Total score is interpreted as follows: Total score 0-1: Indicates the absence of BV. Total score   2: Indeterminate for BV. Additional clinical                  data should be evaluated to establish a                  diagnosis. Total score 3-6: Indicates the presence of BV. This test was developed and its performance characteristics determined by [facility].  It has not been cleared or approved by the Food and Drug Administration.  The FDA has determined that such clearance or approval is not necessary.    Candida albicans, NAA Negative Negative   Candida glabrata, NAA Negative Negative    Comment: This test was developed and its performance characteristics determined by [facility].  It has not been cleared or approved by the Food and Drug Administration.  The FDA has determined that such clearance or approval is not necessary.   Urine culture     Status: None   Collection Time: 01/10/17  3:53 PM  Result Value Ref Range   Urine Culture, Routine Final report    Urine Culture result 1 No growth   POCT urinalysis dipstick     Status: Normal   Collection Time: 01/24/17  2:31 PM  Result Value Ref Range    Color, UA light yellow    Clarity, UA clear    Glucose, UA neg    Bilirubin, UA neg    Ketones, UA neg    Spec Grav, UA 1.015    Blood, UA neg    pH, UA 6.0    Protein, UA neg    Urobilinogen, UA 0.2    Nitrite, UA neg    Leukocytes, UA Negative Negative      PHQ2/9: Depression screen [facility] 2/9 01/24/2017 09/02/2016 06/20/2016 03/17/2016 08/14/2015  Decreased Interest 0 0 0 0 0  Down, Depressed, Hopeless 0 0 0 0 0  PHQ - 2 Score 0 0 0 0 0  Altered sleeping - - - - -  Tired, decreased energy - - - - -  Change in appetite - - - - -  Feeling bad or failure about yourself  - - - - -  Trouble concentrating - - - - -  Moving slowly or fidgety/restless - - - - -  Suicidal thoughts - - - - -  PHQ-9 Score - - - - -  Difficult doing work/chores - - - - -     Fall Risk: Fall Risk  01/24/2017 09/02/2016 06/20/2016 03/17/2016 12/17/2015  Falls in the past year? No No No No No  Risk for fall due to : - - - - -     Functional Status Survey: Is the patient deaf or have difficulty hearing?: No Does the patient have difficulty seeing, even when wearing glasses/contacts?: No Does the patient have difficulty concentrating, remembering, or making decisions?: No Does the patient have difficulty walking or climbing stairs?: No Does the patient have difficulty dressing or bathing?: No Does the patient have difficulty doing errands alone such as visiting a doctor's office or shopping?: No    Assessment & Plan  1. Acute right-sided low back pain without sciatica  Advised to resume Ibuprofen three times daily  - POCT urinalysis dipstick  Skelaxin was too expensive, we will change to Flexeril but can only take it at night - cyclobenzaprine (FLEXERIL) 10 MG tablet; Take 1 tablet (10 mg total) by mouth at bedtime.  Dispense: 30 tablet; Refill: 0

## 2017-01-24 NOTE — Addendum Note (Signed)
Addended by: Steele Sizer F on: 01/24/2017 04:27 PM   Modules accepted: Orders

## 2017-02-07 ENCOUNTER — Other Ambulatory Visit: Payer: Self-pay | Admitting: Family Medicine

## 2017-02-07 DIAGNOSIS — N760 Acute vaginitis: Secondary | ICD-10-CM

## 2017-02-09 ENCOUNTER — Ambulatory Visit
Admission: RE | Admit: 2017-02-09 | Discharge: 2017-02-09 | Disposition: A | Discharge status: Home / Self Care | Payer: 59 | Source: Ambulatory Visit | Attending: Family Medicine | Admitting: Family Medicine

## 2017-02-09 ENCOUNTER — Other Ambulatory Visit: Payer: Self-pay | Admitting: Family Medicine

## 2017-02-09 DIAGNOSIS — N760 Acute vaginitis: Secondary | ICD-10-CM

## 2017-02-09 DIAGNOSIS — Z1231 Encounter for screening mammogram for malignant neoplasm of breast: Secondary | ICD-10-CM | POA: Insufficient documentation

## 2017-02-09 MED ORDER — FLUCONAZOLE 150 MG PO TABS: 150.0000 mg | ORAL_TABLET | ORAL | 0 refills | Status: DC

## 2017-02-13 ENCOUNTER — Ambulatory Visit (INDEPENDENT_AMBULATORY_CARE_PROVIDER_SITE_OTHER): Payer: 59

## 2017-02-13 DIAGNOSIS — E538 Deficiency of other specified B group vitamins: Secondary | ICD-10-CM

## 2017-02-13 MED ORDER — CYANOCOBALAMIN 1000 MCG/ML IJ SOLN
1000.0000 ug | Freq: Once | INTRAMUSCULAR | Status: AC
  Administered 2017-02-13: 1000 ug via INTRAMUSCULAR

## 2017-03-02 ENCOUNTER — Other Ambulatory Visit: Payer: Self-pay | Admitting: Family Medicine

## 2017-03-02 DIAGNOSIS — K219 Gastro-esophageal reflux disease without esophagitis: Secondary | ICD-10-CM

## 2017-03-02 MED ORDER — DEXILANT 60 MG PO CPDR: DELAYED_RELEASE_CAPSULE | ORAL | 5 refills | Status: DC

## 2017-03-02 NOTE — Telephone Encounter (Signed)
Pt needs refill on Dexilant to be sent to [facility].

## 2017-03-08 ENCOUNTER — Other Ambulatory Visit: Payer: Self-pay | Admitting: Family Medicine

## 2017-03-08 DIAGNOSIS — K219 Gastro-esophageal reflux disease without esophagitis: Secondary | ICD-10-CM

## 2017-03-15 ENCOUNTER — Other Ambulatory Visit: Payer: Self-pay | Admitting: Family Medicine

## 2017-03-15 DIAGNOSIS — K644 Residual hemorrhoidal skin tags: Secondary | ICD-10-CM

## 2017-03-20 ENCOUNTER — Ambulatory Visit (INDEPENDENT_AMBULATORY_CARE_PROVIDER_SITE_OTHER): Payer: 59

## 2017-03-20 ENCOUNTER — Other Ambulatory Visit: Payer: Self-pay | Admitting: Family Medicine

## 2017-03-20 DIAGNOSIS — E538 Deficiency of other specified B group vitamins: Secondary | ICD-10-CM | POA: Diagnosis not present

## 2017-03-20 DIAGNOSIS — K644 Residual hemorrhoidal skin tags: Secondary | ICD-10-CM

## 2017-03-20 MED ORDER — CYANOCOBALAMIN 1000 MCG/ML IJ SOLN
1000.0000 ug | Freq: Once | INTRAMUSCULAR | Status: AC
  Administered 2017-03-20: 1000 ug via INTRAMUSCULAR

## 2017-03-20 MED ORDER — HYDROCORTISONE ACE-PRAMOXINE 1-1 % RE FOAM: 1.0000 | Freq: Two times a day (BID) | RECTAL | 0 refills | Status: DC

## 2017-03-24 ENCOUNTER — Other Ambulatory Visit: Payer: Self-pay | Admitting: Family Medicine

## 2017-03-24 DIAGNOSIS — N760 Acute vaginitis: Secondary | ICD-10-CM

## 2017-03-30 ENCOUNTER — Ambulatory Visit (INDEPENDENT_AMBULATORY_CARE_PROVIDER_SITE_OTHER): Payer: 59 | Admitting: Obstetrics and Gynecology

## 2017-03-30 ENCOUNTER — Encounter: Payer: Self-pay | Admitting: Obstetrics and Gynecology

## 2017-03-30 VITALS — BP 102/62 | HR 67 | Ht 66.0 in | Wt 125.8 lb

## 2017-03-30 DIAGNOSIS — R350 Frequency of micturition: Secondary | ICD-10-CM | POA: Diagnosis not present

## 2017-03-30 DIAGNOSIS — N760 Acute vaginitis: Secondary | ICD-10-CM | POA: Diagnosis not present

## 2017-03-30 DIAGNOSIS — N809 Endometriosis, unspecified: Secondary | ICD-10-CM | POA: Diagnosis not present

## 2017-03-30 DIAGNOSIS — N941 Unspecified dyspareunia: Secondary | ICD-10-CM | POA: Diagnosis not present

## 2017-03-30 DIAGNOSIS — N946 Dysmenorrhea, unspecified: Secondary | ICD-10-CM | POA: Diagnosis not present

## 2017-03-30 DIAGNOSIS — B379 Candidiasis, unspecified: Secondary | ICD-10-CM | POA: Diagnosis not present

## 2017-03-30 LAB — POCT URINALYSIS DIPSTICK
BILIRUBIN UA: NEGATIVE
Blood, UA: NEGATIVE
GLUCOSE UA: NEGATIVE
KETONES UA: NEGATIVE
Leukocytes, UA: NEGATIVE
NITRITE UA: NEGATIVE
PH UA: 6 (ref 5.0–8.0)
Protein, UA: NEGATIVE
Spec Grav, UA: <=1.005 — AB (ref 1.010–1.025)
Urobilinogen, UA: NEGATIVE E.U./dL — AB

## 2017-03-30 NOTE — Progress Notes (Signed)
Chief complaint: 1. Vaginal discharge 2. Dyspareunia  Patient presents for evaluation of the above complaints. Vaginal discharge is associated with some burning without itching or malodor. Symptoms tend to wax and wane.  Patient also is experiencing off again on-again discomfort in the pelvis especially associated with intercourse over the past 6 months. She does have history of endometriosis which was diagnosed at the time of her tubal ligation. Bowel function is normal. She does not have pain with bowel movements Bladder function is normal. She denies UTI symptoms.  OBJECTIVE: BP 102/62   Pulse 67   Ht 5\' 6"  (1.676 m)   Wt 125 lb 12.8 oz (57.1 kg)   LMP 03/09/2017 (Exact Date)   BMI 20.30 kg/m  Pleasant female in no acute distress. She is alert and oriented. Back: No CVA tenderness Abdomen: Soft, nontender without peritoneal signs; no hepatosplenomegaly. No hernia. No pelvic masses. Pelvic: External genitalia normal BUS normal Vagina-minimal white secretions in the vaginal vault; mild atrophic changes Cervix-mild cervical motion tenderness; no lesions Uterus-midplane to retroverted, 2/4 tender, decreased mobility, normal size and shape Adnexa-nonpalpable and nontender Rectovaginal-normal external exam  PROCEDURE: Wet prep Normal saline-negative-negative KOH-negative  ASSESSMENT: 1. Vaginal discharge, unclear etiology; wet prep-negative 2. Pelvic pain-dysmenorrhea and dyspareunia, nonacute 3. History of endometriosis status post excision and fulguration at the time of laparoscopy  PLAN: 1. Wet prep is noted 2. Swallow is sent 3. Continue to monitor pelvic pain symptoms 4. Return in 3 months for follow-up and consideration for possible hysterectomy  A total of 15 minutes were spent face-to-face with the patient during this encounter and over half of that time dealt with counseling and coordination of care.  [name], MD  Note: This dictation was prepared  with Dragon dictation along with smaller phrase technology. Any transcriptional errors that result from this process are unintentional.

## 2017-03-30 NOTE — Patient Instructions (Signed)
1. Wet prep-negative today for yeast infection 2. Nuswab is obtained 3. Monitor pelvic pain symptoms including dysmenorrhea and dyspareunia 4. Return in 3 months for follow-up and consideration for hysterectomy

## 2017-03-31 LAB — HEMOGLOBIN A1C
Est. average glucose Bld gHb Est-mCnc: 105 mg/dL
HEMOGLOBIN A1C: 5.3 % (ref 4.8–5.6)

## 2017-04-01 LAB — URINE CULTURE: Organism ID, Bacteria: NO GROWTH

## 2017-04-04 LAB — NUSWAB VAGINITIS PLUS (VG+)
CANDIDA GLABRATA, NAA: NEGATIVE
Candida albicans, NAA: NEGATIVE
Chlamydia trachomatis, NAA: NEGATIVE
Neisseria gonorrhoeae, NAA: NEGATIVE
Trich vag by NAA: NEGATIVE

## 2017-04-10 ENCOUNTER — Encounter: Payer: Self-pay | Admitting: Family Medicine

## 2017-04-10 ENCOUNTER — Ambulatory Visit (INDEPENDENT_AMBULATORY_CARE_PROVIDER_SITE_OTHER): Payer: 59 | Admitting: Family Medicine

## 2017-04-10 VITALS — BP 104/68 | HR 62 | Temp 97.8°F | Resp 16 | Ht 66.0 in | Wt 126.5 lb

## 2017-04-10 DIAGNOSIS — J4540 Moderate persistent asthma, uncomplicated: Secondary | ICD-10-CM | POA: Diagnosis not present

## 2017-04-10 DIAGNOSIS — G43009 Migraine without aura, not intractable, without status migrainosus: Secondary | ICD-10-CM | POA: Diagnosis not present

## 2017-04-10 DIAGNOSIS — R110 Nausea: Secondary | ICD-10-CM

## 2017-04-10 DIAGNOSIS — K219 Gastro-esophageal reflux disease without esophagitis: Secondary | ICD-10-CM | POA: Diagnosis not present

## 2017-04-10 DIAGNOSIS — N760 Acute vaginitis: Secondary | ICD-10-CM

## 2017-04-10 DIAGNOSIS — F419 Anxiety disorder, unspecified: Secondary | ICD-10-CM

## 2017-04-10 DIAGNOSIS — K644 Residual hemorrhoidal skin tags: Secondary | ICD-10-CM

## 2017-04-10 DIAGNOSIS — K5909 Other constipation: Secondary | ICD-10-CM

## 2017-04-10 MED ORDER — ALPRAZOLAM 0.5 MG PO TABS: 0.5000 mg | ORAL_TABLET | Freq: Two times a day (BID) | ORAL | 0 refills | Status: DC | PRN

## 2017-04-10 MED ORDER — HYDROCORTISONE ACE-PRAMOXINE 1-1 % RE FOAM: 1.0000 | Freq: Two times a day (BID) | RECTAL | 0 refills | Status: DC

## 2017-04-10 MED ORDER — FLUCONAZOLE 150 MG PO TABS: 150.0000 mg | ORAL_TABLET | ORAL | 0 refills | Status: DC

## 2017-04-10 NOTE — Progress Notes (Signed)
Name: Jessica Carroll   MRN: [id number]    DOB: 02-08-1971   Date:04/10/2017       Progress Note  Subjective  Chief Complaint  Chief Complaint  Patient presents with  . Migraine    3 month follow up none in last 3 months  . Gastroesophageal Reflux  . Pain    occasional  . Insomnia    no issues  . Anxiety    HPI  Asthma: No recent flares; dry cough has resolved. She has been taking Advair twice daily and Ventolin a couple of times per month. Doing much better.  GERD/dysphagia: No longer having dysphagia, just nausea, occasionally upper abdominal pain, it does not seem to be related to meals. Seen by Dr. Durwin Reges and he suspects gallbladder disease, but she needs to contact insurance to find out about coverage.    Hemorrhoids: she has noticed bulging of hemorrhoids that continue to flare; She lifts a lot of heavy items at work and does strain with BM's even with taking Linzess.  There is no bleeding but she has intermittent itching, uses otc medication with some improvement but would like a refill of rx medication   Migraine:No recent episodes, takes Maxalt PRN. Headache is described as throbbing, usually frontal or nuchal, associated with photophobia, and phonophobia. She has nausea, no vomiting with episodes.   Recurrent vaginitis: Saw  Dr. Enzo Bi on 03/30/17 and he discussed possibility of partial hysterectomy due to endometriosis - will be following up in 3 mos to discuss further/schedule. Controlled with diflucan weekly as needed, currently no vaginal discharge or itching, she states symptoms recurs if she skips medication   Chronic constipation: she states she has daily bowel movement with medication, doing well with lower dose of Linzess, no longer has to rush to use the bathroom. Continues to have hemorrhoids as above.  Anxiety: she worries a lot and when very stressed it causes her chest to hurt and some mild shortness of breath; she feels better after taking a  alprazolam, 30 pills lasts about 3 months - she is willing to cut down to 25 pills for the next 3 month period. Discussed to use only prn. Discussed SSRI to help with hot flashes and anxiety, but she states it affected her sex drive in the past.   Has been sleeping well, uses ambien once every 3-4 weeks. Works 10 hours a day 6 days a week; fiancee's son has caused stress too.  Patient Active Problem List   Diagnosis Date Noted  . Vaginal pain 01/10/2017  . Recurrent vaginitis 01/10/2017  . Hemorrhoids, external without complications 83/38/2505  . Nausea in adult patient 02/24/2016  . Endometriosis determined by laparoscopy 01/25/2016  . Dysmenorrhea 12/30/2015  . Dyspareunia, female 12/30/2015  . Chronic constipation 08/14/2015  . Yeast vaginitis 08/14/2015  . Bilateral ganglion cysts of wrists 08/14/2015  . Mild mitral regurgitation 05/19/2015  . B12 deficiency 05/18/2015  . Asthma, moderate 05/18/2015  . Anxiety 05/18/2015  . Insomnia, persistent 05/18/2015  . Eczema of hand 05/18/2015  . Viral keratitis 05/18/2015  . Migraine without aura and without status migrainosus, not intractable 05/18/2015  . Numerous moles 05/18/2015  . Allergic rhinitis 05/18/2015  . History of corneal transplant 05/18/2015  . Gastroesophageal reflux disease without esophagitis 12/16/2009    Past Surgical History:  Procedure Laterality Date  . CORNEAL TRANSPLANT Right 01/07/2013  . eyelid surgery Right June 19, 2015   [facility] by Dr. Norlene Duel  . LAPAROSCOPIC BILATERAL SALPINGECTOMY N/A 01/25/2016  Procedure: LAPAROSCOPIC BILATERAL SALPINGECTOMY, ADHESIOLYSIS, LAPAROSCOPIC EXCISION/FULGERATION ENDOMETRIOSIS ;  Surgeon: [name], MD;  Location: [facility] ORS;  Service: Gynecology;  Laterality: N/A;  . Laparoscopic excision and fulguration of endometriosis N/A    bladder flap and uterosacral ligament endometriosis, excised and cauterized  . UPPER GI ENDOSCOPY  07/19/2012    Family History   Problem Relation Age of Onset  . Adopted: Yes  . Hyperlipidemia Mother   . Hypertension Mother   . Diabetes Father   . Heart failure Father 52    hear attack  . Cancer Neg Hx   . Breast cancer Neg Hx     Social History   Social History  . Marital status: Single    Spouse name: N/A  . Number of children: N/A  . Years of education: 68   Occupational History  . textile    Social History Main Topics  . Smoking status: Never Smoker  . Smokeless tobacco: Never Used  . Alcohol use 0.0 oz/week     Comment: occ. wine  . Drug use: No  . Sexual activity: Yes    Partners: Male    Birth control/ protection: None, Surgical   Other Topics Concern  . Not on file   Social History Narrative  . No narrative on file     Current Outpatient Prescriptions:  .  ALPRAZolam (XANAX) 0.5 MG tablet, Take 1 tablet (0.5 mg total) by mouth 2 (two) times daily as needed for anxiety., Disp: 30 tablet, Rfl: 0 .  cyclobenzaprine (FLEXERIL) 10 MG tablet, Take 1 tablet (10 mg total) by mouth at bedtime., Disp: 30 tablet, Rfl: 0 .  DEXILANT 60 MG capsule, TAKE 1 CAPSULE(60 MG) BY MOUTH DAILY, Disp: 30 capsule, Rfl: 5 .  fluconazole (DIFLUCAN) 150 MG tablet, Take 1 tablet (150 mg total) by mouth every other day., Disp: 3 tablet, Rfl: 0 .  Fluticasone-Salmeterol (ADVAIR) 250-50 MCG/DOSE AEPB, Inhale 2 puffs into the lungs 2 (two) times daily., Disp: 60 each, Rfl: 5 .  hydrocortisone-pramoxine (PROCTOFOAM HC) rectal foam, Place 1 applicator rectally 2 (two) times daily., Disp: 10 g, Rfl: 0 .  ibuprofen (ADVIL,MOTRIN) 800 MG tablet, TAKE 1 TABLET(800 MG) BY MOUTH THREE TIMES DAILY AS NEEDED, Disp: 90 tablet, Rfl: 0 .  linaclotide (LINZESS) 72 MCG capsule, TAKE 1 CAPSULE(72 MCG) BY MOUTH DAILY BEFORE BREAKFAST, Disp: 30 capsule, Rfl: 5 .  MULTIPLE VITAMIN PO, Take 1 tablet by mouth daily., Disp: , Rfl:  .  prednisoLONE acetate (PRED FORTE) 1 % ophthalmic suspension, , Disp: , Rfl:  .  promethazine  (PHENERGAN) 12.5 MG tablet, Take 1 tablet (12.5 mg total) by mouth every 8 (eight) hours as needed for nausea or vomiting., Disp: 20 tablet, Rfl: 0 .  rizatriptan (MAXALT) 10 MG tablet, Take 1 tablet (10 mg total) by mouth 2 (two) times daily as needed., Disp: 10 tablet, Rfl: 2 .  triamcinolone cream (KENALOG) 0.1 %, Apply 1 application topically 2 (two) times daily as needed., Disp: 30 g, Rfl: 0 .  valACYclovir (VALTREX) 500 MG tablet, Take 1 tablet by mouth 2 (two) times daily., Disp: , Rfl: 0 .  zolpidem (AMBIEN) 5 MG tablet, Take 1 tablet (5 mg total) by mouth at bedtime as needed for sleep., Disp: 30 tablet, Rfl: 2  Allergies  Allergen Reactions  . Penicillins Nausea Only     ROS  Constitutional: Negative for fever or weight change.  Respiratory: Negative for cough and shortness of breath.   Cardiovascular: Negative for  chest pain or palpitations.  Gastrointestinal: Negative for abdominal pain, no bowel changes.  Musculoskeletal: Negative for gait problem or joint swelling.  Skin: Negative for rash.  Neurological: Negative for dizziness or headache.  No other specific complaints in a complete review of systems (except as listed in HPI above).  Objective  Vitals:   04/10/17 1423  BP: 104/68  Pulse: 62  Resp: 16  Temp: 97.8 F (36.6 C)  SpO2: 99%  Weight: 126 lb 8 oz (57.4 kg)  Height: 5\' 6"  (1.676 m)    Body mass index is 20.42 kg/m.  Physical Exam Constitutional: Patient appears well-developed and well-nourished. No distress.  HENT: Head: Normocephalic and atraumatic. Mouth/Throat: Oropharynx is clear and moist. No oropharyngeal exudate.  Eyes: Conjunctivae and EOM are normal. Pupils are equal, round, and reactive to light. No scleral icterus.  Neck: Normal range of motion. Neck supple. No JVD present. No thyromegaly present.  Cardiovascular: Normal rate, regular rhythm and normal heart sounds.  No murmur heard. No BLE edema. Pulmonary/Chest: Effort normal and  breath sounds normal. No respiratory distress. Abdominal: Soft. Bowel sounds are normal, no distension. There is no tenderness. no masses Musculoskeletal: Normal range of motion, no joint effusions. No gross deformities Neurological: he is alert and oriented to person, place, and time. No cranial nerve deficit. Coordination, balance, strength, speech and gait are normal.  Skin: Skin is warm and dry. No rash noted. No erythema.  Psychiatric: Patient has a normal mood and affect. behavior is normal. Judgment and thought content normal.  Recent Results (from the past 2160 hour(s))  POCT urinalysis dipstick     Status: None   Collection Time: 01/10/17  3:37 PM  Result Value Ref Range   Color, UA yellow    Clarity, UA clear    Glucose, UA neg    Bilirubin, UA neg    Ketones, UA neg    Spec Grav, UA 1.010    Blood, UA neg    pH, UA 6.0    Protein, UA neg    Urobilinogen, UA negative    Nitrite, UA neg    Leukocytes, UA Negative Negative  NuSwab BV and Candida, NAA     Status: None   Collection Time: 01/10/17  3:52 PM  Result Value Ref Range   Atopobium vaginae Low - 0 Score   BVAB 2 Moderate - 1 Score   Megasphaera 1 Low - 0 Score    Comment: Calculate total score by adding the 3 individual bacterial vaginosis (BV) marker scores together.  Total score is interpreted as follows: Total score 0-1: Indicates the absence of BV. Total score   2: Indeterminate for BV. Additional clinical                  data should be evaluated to establish a                  diagnosis. Total score 3-6: Indicates the presence of BV. This test was developed and its performance characteristics determined by [facility].  It has not been cleared or approved by the Food and Drug Administration.  The FDA has determined that such clearance or approval is not necessary.    Candida albicans, NAA Negative Negative   Candida glabrata, NAA Negative Negative    Comment: This test was developed and its performance  characteristics determined by [facility].  It has not been cleared or approved by the Food and Drug Administration.  The FDA has determined that such clearance  or approval is not necessary.   Urine culture     Status: None   Collection Time: 01/10/17  3:53 PM  Result Value Ref Range   Urine Culture, Routine Final report    Urine Culture result 1 No growth   POCT urinalysis dipstick     Status: Normal   Collection Time: 01/24/17  2:31 PM  Result Value Ref Range   Color, UA light yellow    Clarity, UA clear    Glucose, UA neg    Bilirubin, UA neg    Ketones, UA neg    Spec Grav, UA 1.015    Blood, UA neg    pH, UA 6.0    Protein, UA neg    Urobilinogen, UA 0.2    Nitrite, UA neg    Leukocytes, UA Negative Negative  POCT urinalysis dipstick     Status: Abnormal   Collection Time: 03/30/17  1:41 PM  Result Value Ref Range   Color, UA yellow    Clarity, UA clear    Glucose, UA neg    Bilirubin, UA neg    Ketones, UA neg    Spec Grav, UA <=1.005 (A) 1.010 - 1.025   Blood, UA neg    pH, UA 6.0 5.0 - 8.0   Protein, UA neg    Urobilinogen, UA negative (A) 0.2 or 1.0 E.U./dL   Nitrite, UA neg    Leukocytes, UA Negative Negative  Hemoglobin A1c     Status: None   Collection Time: 03/30/17  2:28 PM  Result Value Ref Range   Hgb A1c MFr Bld 5.3 4.8 - 5.6 %    Comment:          Pre-diabetes: 5.7 - 6.4          Diabetes: >6.4          Glycemic control for adults with diabetes: <7.0    Est. average glucose Bld gHb Est-mCnc 105 mg/dL  NuSwab Vaginitis Plus (VG+)     Status: None   Collection Time: 03/30/17  2:40 PM  Result Value Ref Range   Atopobium vaginae Low - 0 Score   BVAB 2 Moderate - 1 Score   Megasphaera 1 Low - 0 Score    Comment: Calculate total score by adding the 3 individual bacterial vaginosis (BV) marker scores together.  Total score is interpreted as follows: Total score 0-1: Indicates the absence of BV. Total score   2: Indeterminate for BV. Additional  clinical                  data should be evaluated to establish a                  diagnosis. Total score 3-6: Indicates the presence of BV. This test was developed and its performance characteristics determined by [facility].  It has not been cleared or approved by the Food and Drug Administration.  The FDA has determined that such clearance or approval is not necessary.    Candida albicans, NAA Negative Negative   Candida glabrata, NAA Negative Negative    Comment: This test was developed and its performance characteristics determined by [facility].  It has not been cleared or approved by the Food and Drug Administration.  The FDA has determined that such clearance or approval is not necessary.    Trich vag by NAA Negative Negative   Chlamydia trachomatis, NAA Negative Negative   Neisseria gonorrhoeae, NAA Negative Negative  Urine culture  Status: None   Collection Time: 03/30/17  2:40 PM  Result Value Ref Range   Urine Culture, Routine Final report    Urine Culture result 1 No growth     PHQ2/9: Depression screen [facility] 2/9 01/24/2017 09/02/2016 06/20/2016 03/17/2016 08/14/2015  Decreased Interest 0 0 0 0 0  Down, Depressed, Hopeless 0 0 0 0 0  PHQ - 2 Score 0 0 0 0 0  Altered sleeping - - - - -  Tired, decreased energy - - - - -  Change in appetite - - - - -  Feeling bad or failure about yourself  - - - - -  Trouble concentrating - - - - -  Moving slowly or fidgety/restless - - - - -  Suicidal thoughts - - - - -  PHQ-9 Score - - - - -  Difficult doing work/chores - - - - -    Fall Risk: Fall Risk  01/24/2017 09/02/2016 06/20/2016 03/17/2016 12/17/2015  Falls in the past year? No No No No No  Risk for fall due to : - - - - -    Assessment & Plan  1. Chronic constipation -Linzess as prescribed, consider elimination diet to determine irritants.  2. Hemorrhoids, external without complications - Ambulatory referral to General Surgery - hydrocortisone-pramoxine (PROCTOFOAM HC) rectal  foam; Place 1 applicator rectally 2 (two) times daily.  Dispense: 10 g; Refill: 0  3. Migraine without aura and without status migrainosus, not intractable -Maxalt PRN  4. Moderate persistent asthma without complication -Continue Advair and PRN Albuterol -Stable  5. Gastroesophageal reflux disease without esophagitis -Continue Dexilant, consider elimination diet to determine irritants.  6. Anxiety - ALPRAZolam (XANAX) 0.5 MG tablet; Take 1 tablet (0.5 mg total) by mouth 2 (two) times daily as needed for anxiety.  Dispense: 25 tablet; Refill: 0  7. Recurrent vaginitis - fluconazole (DIFLUCAN) 150 MG tablet; Take 1 tablet (150 mg total) by mouth every other day.  Dispense: 3 tablet; Refill: 0  8. Nausea in adult patient -Phenergan PRN  -Reviewed Health Maintenance: CPE scheduled for 08/2017  I saw the patient with ,Raelyn Ensign, FNP, NP-C. She was the scriber of the note  Steele Sizer, MD [facility] 04/10/2017, 3:38 PM

## 2017-04-10 NOTE — Patient Instructions (Addendum)
Low FodMap Diet - try to discover triggers.

## 2017-04-11 ENCOUNTER — Encounter: Payer: Self-pay | Admitting: General Surgery

## 2017-04-17 ENCOUNTER — Encounter: Payer: Self-pay | Admitting: Family Medicine

## 2017-04-18 ENCOUNTER — Other Ambulatory Visit: Payer: Self-pay | Admitting: Family Medicine

## 2017-04-18 DIAGNOSIS — J4540 Moderate persistent asthma, uncomplicated: Secondary | ICD-10-CM

## 2017-04-18 MED ORDER — ALBUTEROL SULFATE HFA 108 (90 BASE) MCG/ACT IN AERS: 2.0000 | INHALATION_SPRAY | Freq: Four times a day (QID) | RESPIRATORY_TRACT | 1 refills | Status: DC | PRN

## 2017-04-18 MED ORDER — FLUTICASONE-SALMETEROL 250-50 MCG/DOSE IN AEPB: 2.0000 | INHALATION_SPRAY | Freq: Two times a day (BID) | RESPIRATORY_TRACT | 5 refills | Status: DC

## 2017-04-19 ENCOUNTER — Ambulatory Visit (INDEPENDENT_AMBULATORY_CARE_PROVIDER_SITE_OTHER): Payer: 59 | Admitting: General Surgery

## 2017-04-19 ENCOUNTER — Encounter: Payer: Self-pay | Admitting: General Surgery

## 2017-04-19 VITALS — BP 110/62 | HR 66 | Resp 12 | Ht 66.0 in | Wt 124.0 lb

## 2017-04-19 DIAGNOSIS — L290 Pruritus ani: Secondary | ICD-10-CM

## 2017-04-19 NOTE — Progress Notes (Signed)
Patient ID: Jessica Carroll, female   DOB: [birth year], 46 y.o.   MRN: [id number]  Chief Complaint  Patient presents with  . Other    hemorrhoids    HPI Jessica Carroll is a 46 y.o. female. Here today for evalution of hemorrhoids that she has had for several years, referred by Dr Ancil Boozer. Bowels move daily with the help of linzess, no bleeding noted. Occasional rectal pain with swelling but not daily. She does notice itching and burning. She does use preparation H for cleaning. She states the proctofoam does help and has been using it on and off for 1-2 years.   HPI  Past Medical History:  Diagnosis Date  . Anxiety   . Asthma 2011  . Diffuse cystic mastopathy   . Endometriosis   . GERD (gastroesophageal reflux disease)   . Headache     Past Surgical History:  Procedure Laterality Date  . CORNEAL TRANSPLANT Right 01/07/2013  . eyelid surgery Right June 19, 2015   [facility] by Dr. Norlene Duel  . LAPAROSCOPIC BILATERAL SALPINGECTOMY N/A 01/25/2016   Procedure: LAPAROSCOPIC BILATERAL SALPINGECTOMY, ADHESIOLYSIS, LAPAROSCOPIC EXCISION/FULGERATION ENDOMETRIOSIS ;  Surgeon: [name], MD;  Location: [facility] ORS;  Service: Gynecology;  Laterality: N/A;  . Laparoscopic excision and fulguration of endometriosis N/A    bladder flap and uterosacral ligament endometriosis, excised and cauterized  . UPPER GI ENDOSCOPY  07/19/2012    Family History  Problem Relation Age of Onset  . Adopted: Yes  . Hyperlipidemia Mother   . Hypertension Mother   . Diabetes Father   . Heart failure Father 41       hear attack  . Cancer Neg Hx   . Breast cancer Neg Hx     Social History Social History  Substance Use Topics  . Smoking status: Never Smoker  . Smokeless tobacco: Never Used  . Alcohol use 0.0 oz/week     Comment: occ. wine    Allergies  Allergen Reactions  . Penicillins Nausea Only    Current Outpatient Prescriptions  Medication Sig Dispense Refill  . albuterol  (PROVENTIL HFA) 108 (90 Base) MCG/ACT inhaler Inhale 2 puffs into the lungs every 6 (six) hours as needed for wheezing or shortness of breath. 1 Inhaler 1  . ALPRAZolam (XANAX) 0.5 MG tablet Take 1 tablet (0.5 mg total) by mouth 2 (two) times daily as needed for anxiety. 25 tablet 0  . DEXILANT 60 MG capsule TAKE 1 CAPSULE(60 MG) BY MOUTH DAILY 30 capsule 5  . fluconazole (DIFLUCAN) 150 MG tablet Take 1 tablet (150 mg total) by mouth every other day. (Patient taking differently: Take 150 mg by mouth as needed. ) 3 tablet 0  . Fluticasone-Salmeterol (ADVAIR) 250-50 MCG/DOSE AEPB Inhale 2 puffs into the lungs 2 (two) times daily. 60 each 5  . hydrocortisone-pramoxine (PROCTOFOAM HC) rectal foam Place 1 applicator rectally 2 (two) times daily. 10 g 0  . ibuprofen (ADVIL,MOTRIN) 800 MG tablet TAKE 1 TABLET(800 MG) BY MOUTH THREE TIMES DAILY AS NEEDED 90 tablet 0  . linaclotide (LINZESS) 72 MCG capsule TAKE 1 CAPSULE(72 MCG) BY MOUTH DAILY BEFORE BREAKFAST 30 capsule 5  . MULTIPLE VITAMIN PO Take 1 tablet by mouth daily.    . prednisoLONE acetate (PRED FORTE) 1 % ophthalmic suspension Place 1 drop into the right eye daily.     . promethazine (PHENERGAN) 12.5 MG tablet Take 1 tablet (12.5 mg total) by mouth every 8 (eight) hours as needed for nausea or vomiting.  20 tablet 0  . rizatriptan (MAXALT) 10 MG tablet Take 1 tablet (10 mg total) by mouth 2 (two) times daily as needed. 10 tablet 2  . triamcinolone cream (KENALOG) 0.1 % Apply 1 application topically 2 (two) times daily as needed. 30 g 0  . valACYclovir (VALTREX) 500 MG tablet Take 1 tablet by mouth 2 (two) times daily.  0  . zolpidem (AMBIEN) 5 MG tablet Take 1 tablet (5 mg total) by mouth at bedtime as needed for sleep. 30 tablet 2   No current facility-administered medications for this visit.     Review of Systems Review of Systems  Constitutional: Negative.   Respiratory: Negative.   Gastrointestinal: Positive for constipation. Negative  for blood in stool and diarrhea.    Blood pressure 110/62, pulse 66, resp. rate 12, height 5\' 6"  (1.676 m), weight 124 lb (56.2 kg), last menstrual period 04/01/2017.  Physical Exam Physical Exam  Constitutional: She is oriented to person, place, and time. She appears well-developed and well-nourished.  HENT:  Mouth/Throat: Oropharynx is clear and moist.  Eyes: Conjunctivae are normal. No scleral icterus.  Neck: Neck supple.  Cardiovascular: Normal rate, regular rhythm and normal heart sounds.   Pulmonary/Chest: Effort normal and breath sounds normal.  Abdominal: Soft. Bowel sounds are normal. There is no tenderness.  Genitourinary:     Genitourinary Comments: Digital exam was not possible secondary to discomfort. No evidence of fissure, but the index finger could not be inserted through the anus.  Lymphadenopathy:    She has no cervical adenopathy.       Right: No inguinal adenopathy present.       Left: No inguinal adenopathy present.  Neurological: She is alert and oriented to person, place, and time.  Skin: Skin is warm and dry.  Psychiatric: Her behavior is normal.    Data Reviewed 03/30/2017 GYN evaluation.  Assessment    Pruritus ani,  no clear evidence of anal fissure or proximal anorectal disease.    Plan     No indication for surgical intervention.  Exam under anesthesia would be appropriate if the patient fails to show improvement with these simple measures of avoiding irritating activities. Were she do undergo a GYN procedure, I would like the opportunity to complete an exam under anesthesia at that time.    Discussed and recommended not using soap to the rectal area for cleaning. May use witch hazel or OTC  preparation H to the rectal area. May use benadryl cream for itching if needed.  Send a message with a status report in one month.     HPI, Physical Exam, Assessment and Plan have been scribed under the direction and in the presence of Robert Bellow, MD.  Karie Fetch, RN  I have completed the exam and reviewed the above documentation for accuracy and completeness.  I agree with the above.  Haematologist has been used and any errors in dictation or transcription are unintentional.  Hervey Ard, M.D., F.A.C.S.  Robert Bellow 04/22/2017, 4:18 PM

## 2017-04-19 NOTE — Patient Instructions (Addendum)
Discussed and recommended not using soap to the rectal area for cleaning. May use witch hazel or plain  preparation H to the rectal area. May use benadryl cream for itching if needed. Send a message with a status report in one month

## 2017-04-22 DIAGNOSIS — L290 Pruritus ani: Secondary | ICD-10-CM | POA: Insufficient documentation

## 2017-04-25 ENCOUNTER — Ambulatory Visit (INDEPENDENT_AMBULATORY_CARE_PROVIDER_SITE_OTHER): Payer: 59

## 2017-04-25 DIAGNOSIS — E538 Deficiency of other specified B group vitamins: Secondary | ICD-10-CM

## 2017-04-25 MED ORDER — CYANOCOBALAMIN 1000 MCG/ML IJ SOLN
1000.0000 ug | Freq: Once | INTRAMUSCULAR | Status: AC
  Administered 2017-04-25: 1000 ug via INTRAMUSCULAR

## 2017-05-11 ENCOUNTER — Encounter: Payer: Self-pay | Admitting: Family Medicine

## 2017-05-11 ENCOUNTER — Ambulatory Visit (INDEPENDENT_AMBULATORY_CARE_PROVIDER_SITE_OTHER): Payer: 59 | Admitting: Family Medicine

## 2017-05-11 VITALS — BP 106/64 | HR 80 | Temp 98.0°F | Resp 16 | Ht 66.0 in | Wt 125.4 lb

## 2017-05-11 DIAGNOSIS — K219 Gastro-esophageal reflux disease without esophagitis: Secondary | ICD-10-CM

## 2017-05-11 MED ORDER — PANTOPRAZOLE SODIUM 40 MG PO TBEC: 40.0000 mg | DELAYED_RELEASE_TABLET | Freq: Every day | ORAL | 0 refills | Status: DC | PRN

## 2017-05-11 NOTE — Patient Instructions (Addendum)
Food Choices for Gastroesophageal Reflux Disease, Adult When you have gastroesophageal reflux disease (GERD), the foods you eat and your eating habits are very important. Choosing the right foods can help ease your discomfort. What guidelines do I need to follow?  Choose fruits, vegetables, whole grains, and low-fat dairy products.  Choose low-fat meat, fish, and poultry.  Limit fats such as oils, salad dressings, butter, nuts, and avocado.  Keep a food diary. This helps you identify foods that cause symptoms.  Avoid foods that cause symptoms. These may be different for everyone.  Eat small meals often instead of 3 large meals a day.  Eat your meals slowly, in a place where you are relaxed.  Limit fried foods.  Cook foods using methods other than frying.  Avoid drinking alcohol.  Avoid drinking large amounts of liquids with your meals.  Avoid bending over or lying down until 2-3 hours after eating. What foods are not recommended? These are some foods and drinks that may make your symptoms worse: Vegetables Tomatoes. Tomato juice. Tomato and spaghetti sauce. Chili peppers. Onion and garlic. Horseradish. Fruits Oranges, grapefruit, and lemon (fruit and juice). Meats High-fat meats, fish, and poultry. This includes hot dogs, ribs, ham, sausage, salami, and bacon. Dairy Whole milk and chocolate milk. Sour cream. Cream. Butter. Ice cream. Cream cheese. Drinks Coffee and tea. Bubbly (carbonated) drinks or energy drinks. Condiments Hot sauce. Barbecue sauce. Sweets/Desserts Chocolate and cocoa. Donuts. Peppermint and spearmint. Fats and Oils High-fat foods. This includes French fries and potato chips. Other Vinegar. Strong spices. This includes black pepper, white pepper, red pepper, cayenne, curry powder, cloves, ginger, and chili powder. The items listed above may not be a complete list of foods and drinks to avoid. Contact your dietitian for more information. This  information is not intended to replace advice given to you by your health care provider. Make sure you discuss any questions you have with your health care provider. Document Released: 05/29/2012 Document Revised: 05/05/2016 Document Reviewed: 10/02/2013 Elsevier Interactive Patient Education  2017 [employer]  Gastroesophageal Reflux Disease, Adult Normally, food travels down the esophagus and stays in the stomach to be digested. If a person has gastroesophageal reflux disease (GERD), food and stomach acid move back up into the esophagus. When this happens, the esophagus becomes sore and swollen (inflamed). Over time, GERD can make small holes (ulcers) in the lining of the esophagus. Follow these instructions at home: Diet  Follow a diet as told by your doctor. You may need to avoid foods and drinks such as: ? Coffee and tea (with or without caffeine). ? Drinks that contain alcohol. ? Energy drinks and sports drinks. ? Carbonated drinks or sodas. ? Chocolate and cocoa. ? Peppermint and mint flavorings. ? Garlic and onions. ? Horseradish. ? Spicy and acidic foods, such as peppers, chili powder, curry powder, vinegar, hot sauces, and BBQ sauce. ? Citrus fruit juices and citrus fruits, such as oranges, lemons, and limes. ? Tomato-based foods, such as red sauce, chili, salsa, and pizza with red sauce. ? Fried and fatty foods, such as donuts, french fries, potato chips, and high-fat dressings. ? High-fat meats, such as hot dogs, rib eye steak, sausage, ham, and bacon. ? High-fat dairy items, such as whole milk, butter, and cream cheese.  Eat small meals often. Avoid eating large meals.  Avoid drinking large amounts of liquid with your meals.  Avoid eating meals during the 2-3 hours before bedtime.  Avoid lying down right after you eat.  Do   not exercise right after you eat. General instructions  Pay attention to any changes in your symptoms.  Take over-the-counter and prescription  medicines only as told by your doctor. Do not take aspirin, ibuprofen, or other NSAIDs unless your doctor says it is okay.  Do not use any tobacco products, including cigarettes, chewing tobacco, and e-cigarettes. If you need help quitting, ask your doctor.  Wear loose clothes. Do not wear anything tight around your waist.  Raise (elevate) the head of your bed about 6 inches (15 cm).  Try to lower your stress. If you need help doing this, ask your doctor.  If you are overweight, lose an amount of weight that is healthy for you. Ask your doctor about a safe weight loss goal.  Keep all follow-up visits as told by your doctor. This is important. Contact a doctor if:  You have new symptoms.  You lose weight and you do not know why it is happening.  You have trouble swallowing, or it hurts to swallow.  You have wheezing or a cough that keeps happening.  Your symptoms do not get better with treatment.  You have a hoarse voice. Get help right away if:  You have pain in your arms, neck, jaw, teeth, or back.  You feel sweaty, dizzy, or light-headed.  You have chest pain or shortness of breath.  You throw up (vomit) and your throw up looks like blood or coffee grounds.  You pass out (faint).  Your poop (stool) is bloody or black.  You cannot swallow, drink, or eat. This information is not intended to replace advice given to you by your health care provider. Make sure you discuss any questions you have with your health care provider. Document Released: 05/16/2008 Document Revised: 05/05/2016 Document Reviewed: 03/25/2015 Elsevier Interactive Patient Education  2018 Else[employer] Inc.  

## 2017-05-11 NOTE — Progress Notes (Addendum)
Name: Jessica Carroll   MRN: [id number]    DOB: [birth year]   Date:05/11/2017       Progress Note  Subjective  Chief Complaint  Chief Complaint  Patient presents with  . Gastroesophageal Reflux    medication is not helping pt has not adjusted her diet as doctor suggested    HPI  Pt presents with c/o GERD flare. She has been experiencing bloating, esophageal pain and reflux, also having dry cough for the last few days.  Has been taking Pantoprazole PRN that she borrowed from her boyfriend with good relief of symptoms and pepto-bismol. She takes Dexilant daily, takes Linzess for constipation daily and is doing well on this, takes Phenergan PRN for nausea and has been doing well with this.  Discussed diet, lifestyle modifications, angling her bed at night. Has tried Zantac in the past without relief of symptoms. Discussed risks associated with long-term PPI use, patient verbalizes understanding of risks.   Patient Active Problem List   Diagnosis Date Noted  . Pruritus ani 04/22/2017  . Vaginal pain 01/10/2017  . Recurrent vaginitis 01/10/2017  . Hemorrhoids, external without complications 06/06/9484  . Nausea in adult patient 02/24/2016  . Endometriosis determined by laparoscopy 01/25/2016  . Dysmenorrhea 12/30/2015  . Dyspareunia, female 12/30/2015  . Chronic constipation 08/14/2015  . Yeast vaginitis 08/14/2015  . Bilateral ganglion cysts of wrists 08/14/2015  . Mild mitral regurgitation 05/19/2015  . B12 deficiency 05/18/2015  . Asthma, moderate 05/18/2015  . Anxiety 05/18/2015  . Insomnia, persistent 05/18/2015  . Eczema of hand 05/18/2015  . Viral keratitis 05/18/2015  . Migraine without aura and without status migrainosus, not intractable 05/18/2015  . Numerous moles 05/18/2015  . Allergic rhinitis 05/18/2015  . History of corneal transplant 05/18/2015  . Gastroesophageal reflux disease without esophagitis 12/16/2009    Social History  Substance Use Topics  . Smoking  status: Never Smoker  . Smokeless tobacco: Never Used  . Alcohol use 0.0 oz/week     Comment: occ. wine     Current Outpatient Prescriptions:  .  albuterol (PROVENTIL HFA) 108 (90 Base) MCG/ACT inhaler, Inhale 2 puffs into the lungs every 6 (six) hours as needed for wheezing or shortness of breath., Disp: 1 Inhaler, Rfl: 1 .  ALPRAZolam (XANAX) 0.5 MG tablet, Take 1 tablet (0.5 mg total) by mouth 2 (two) times daily as needed for anxiety., Disp: 25 tablet, Rfl: 0 .  DEXILANT 60 MG capsule, TAKE 1 CAPSULE(60 MG) BY MOUTH DAILY, Disp: 30 capsule, Rfl: 5 .  fluconazole (DIFLUCAN) 150 MG tablet, Take 1 tablet (150 mg total) by mouth every other day. (Patient taking differently: Take 150 mg by mouth as needed. ), Disp: 3 tablet, Rfl: 0 .  Fluticasone-Salmeterol (ADVAIR) 250-50 MCG/DOSE AEPB, Inhale 2 puffs into the lungs 2 (two) times daily., Disp: 60 each, Rfl: 5 .  hydrocortisone-pramoxine (PROCTOFOAM HC) rectal foam, Place 1 applicator rectally 2 (two) times daily., Disp: 10 g, Rfl: 0 .  ibuprofen (ADVIL,MOTRIN) 800 MG tablet, TAKE 1 TABLET(800 MG) BY MOUTH THREE TIMES DAILY AS NEEDED, Disp: 90 tablet, Rfl: 0 .  linaclotide (LINZESS) 72 MCG capsule, TAKE 1 CAPSULE(72 MCG) BY MOUTH DAILY BEFORE BREAKFAST, Disp: 30 capsule, Rfl: 5 .  MULTIPLE VITAMIN PO, Take 1 tablet by mouth daily., Disp: , Rfl:  .  prednisoLONE acetate (PRED FORTE) 1 % ophthalmic suspension, Place 1 drop into the right eye daily. , Disp: , Rfl:  .  promethazine (PHENERGAN) 12.5 MG tablet, Take 1 tablet (  12.5 mg total) by mouth every 8 (eight) hours as needed for nausea or vomiting., Disp: 20 tablet, Rfl: 0 .  rizatriptan (MAXALT) 10 MG tablet, Take 1 tablet (10 mg total) by mouth 2 (two) times daily as needed., Disp: 10 tablet, Rfl: 2 .  triamcinolone cream (KENALOG) 0.1 %, Apply 1 application topically 2 (two) times daily as needed., Disp: 30 g, Rfl: 0 .  valACYclovir (VALTREX) 500 MG tablet, Take 1 tablet by mouth 2 (two) times  daily., Disp: , Rfl: 0 .  zolpidem (AMBIEN) 5 MG tablet, Take 1 tablet (5 mg total) by mouth at bedtime as needed for sleep., Disp: 30 tablet, Rfl: 2  Allergies  Allergen Reactions  . Penicillins Nausea Only    ROS  Constitutional: Negative for fever or weight change.  Respiratory: Negative for cough and shortness of breath.   Cardiovascular: Negative for chest pain or palpitations.  Gastrointestinal: See HPI Musculoskeletal: Negative for gait problem or joint swelling.  Skin: Negative for rash.  Neurological: Negative for dizziness or headache.  No other specific complaints in a complete review of systems (except as listed in HPI above).  Objective  Vitals:   05/11/17 1419  BP: 106/64  Pulse: 80  Resp: 16  Temp: 98 F (36.7 C)  SpO2: 98%  Weight: 125 lb 6 oz (56.9 kg)  Height: 5\' 6"  (1.676 m)    Body mass index is 20.24 kg/m.  Nursing Note and Vital Signs reviewed.  Physical Exam  Constitutional: Patient appears well-developed and well-nourished. No distress.  HEENT: head atraumatic, normocephalic Cardiovascular: Normal rate, regular rhythm, S1/S2 present.  No murmur or rub heard. No BLE edema. Pulmonary/Chest: Effort normal and breath sounds clear. No respiratory distress or retractions. Abdominal: Soft and mild tenderness to RLQ and epigastric areas, bowel sounds present x4 quadrants. Psychiatric: Patient has a normal mood and affect. behavior is normal. Judgment and thought content normal.  Recent Results (from the past 2160 hour(s))  POCT urinalysis dipstick     Status: Abnormal   Collection Time: 03/30/17  1:41 PM  Result Value Ref Range   Color, UA yellow    Clarity, UA clear    Glucose, UA neg    Bilirubin, UA neg    Ketones, UA neg    Spec Grav, UA <=1.005 (A) 1.010 - 1.025   Blood, UA neg    pH, UA 6.0 5.0 - 8.0   Protein, UA neg    Urobilinogen, UA negative (A) 0.2 or 1.0 E.U./dL   Nitrite, UA neg    Leukocytes, UA Negative Negative   Hemoglobin A1c     Status: None   Collection Time: 03/30/17  2:28 PM  Result Value Ref Range   Hgb A1c MFr Bld 5.3 4.8 - 5.6 %    Comment:          Pre-diabetes: 5.7 - 6.4          Diabetes: >6.4          Glycemic control for adults with diabetes: <7.0    Est. average glucose Bld gHb Est-mCnc 105 mg/dL  NuSwab Vaginitis Plus (VG+)     Status: None   Collection Time: 03/30/17  2:40 PM  Result Value Ref Range   Atopobium vaginae Low - 0 Score   BVAB 2 Moderate - 1 Score   Megasphaera 1 Low - 0 Score    Comment: Calculate total score by adding the 3 individual bacterial vaginosis (BV) marker scores together.  Total score is  interpreted as follows: Total score 0-1: Indicates the absence of BV. Total score   2: Indeterminate for BV. Additional clinical                  data should be evaluated to establish a                  diagnosis. Total score 3-6: Indicates the presence of BV. This test was developed and its performance characteristics determined by [facility].  It has not been cleared or approved by the Food and Drug Administration.  The FDA has determined that such clearance or approval is not necessary.    Candida albicans, NAA Negative Negative   Candida glabrata, NAA Negative Negative    Comment: This test was developed and its performance characteristics determined by [facility].  It has not been cleared or approved by the Food and Drug Administration.  The FDA has determined that such clearance or approval is not necessary.    Trich vag by NAA Negative Negative   Chlamydia trachomatis, NAA Negative Negative   Neisseria gonorrhoeae, NAA Negative Negative  Urine culture     Status: None   Collection Time: 03/30/17  2:40 PM  Result Value Ref Range   Urine Culture, Routine Final report    Urine Culture result 1 No growth      Assessment & Plan  1. Gastroesophageal reflux disease without esophagitis  - H. pylori breath test - pantoprazole (PROTONIX) 40 MG tablet; Take  1 tablet (40 mg total) by mouth daily as needed.  Dispense: 30 tablet; Refill: 0 -Advised patient to try Protonix and discontinue Dexilant for 4 weeks. If she is improving, she may request a refill of the Protonix or switch back to [place].  If she is not improving in 2-4 weeks, she will call to schedule a follow up appointment and will consider referral to GI. -Discussed lifestyle modifications at length including elevating HOB, avoiding certain foods (provided in AVS), and remaining upright after meals, not eating before bed, and avoiding caffeine/sodas/smoking.  -Red flags and when to present for emergency care or RTC including fever >101.59F, chest pain, shortness of breath, new/worsening/un-resolving symptoms, vomiting, severe abdominal pain reviewed with patient at time of visit. Follow up and care instructions discussed and provided in AVS.  I have reviewed this encounter including the documentation in this note and/or discussed this patient with the Johney Maine, FNP, NP-C. I am certifying that I agree with the content of this note as supervising physician.  Steele Sizer, MD [facility] 05/21/2017, 11:29 AM

## 2017-05-12 LAB — H. PYLORI BREATH TEST: H. pylori Breath Test: NOT DETECTED

## 2017-05-12 NOTE — Progress Notes (Signed)
Hello [name], I am writing to let you know that I have reviewed your lab results: Your H. Pylori test was negative, so you do not need antibiotics.  Please take the medications as we discussed and follow up as scheduled. Make sure you are avoiding trigger foods and elevating your head of bed. Please sit/stand upright for at least 30 minutes after eating.  Please let me know if you have any questions.  Please feel free to message me back or call the clinic with any questions you may have. Thank you so much, Raelyn Ensign NP-C

## 2017-05-15 ENCOUNTER — Encounter: Payer: Self-pay | Admitting: Family Medicine

## 2017-05-17 ENCOUNTER — Encounter: Payer: Self-pay | Admitting: Family Medicine

## 2017-05-17 ENCOUNTER — Telehealth: Payer: Self-pay | Admitting: Obstetrics and Gynecology

## 2017-05-17 DIAGNOSIS — N760 Acute vaginitis: Secondary | ICD-10-CM

## 2017-05-17 MED ORDER — FLUCONAZOLE 150 MG PO TABS: 150.0000 mg | ORAL_TABLET | Freq: Once | ORAL | 0 refills | Status: AC

## 2017-05-17 NOTE — Telephone Encounter (Signed)
Pt states she has a slight itch on the outside. NO d/c. No irritation on the outside. NO change in detergent or soaps. NO new sex partners. S/p 2 new swabs 12/2016 and 03/2017- both neg. Will erx 1 diflucan. Pt aware if sx persist she will need to make an appointment to be seen.

## 2017-05-17 NOTE — Telephone Encounter (Signed)
Patient was instructed by Dr Ruthine Dose to call Dr D for refill  Please call

## 2017-05-17 NOTE — Telephone Encounter (Signed)
lmtrc

## 2017-05-29 ENCOUNTER — Encounter: Payer: Self-pay | Admitting: Family Medicine

## 2017-05-29 ENCOUNTER — Ambulatory Visit (INDEPENDENT_AMBULATORY_CARE_PROVIDER_SITE_OTHER): Payer: 59 | Admitting: Family Medicine

## 2017-05-29 VITALS — BP 112/68 | HR 82 | Temp 97.9°F | Resp 16 | Ht 66.0 in | Wt 122.1 lb

## 2017-05-29 DIAGNOSIS — R0789 Other chest pain: Secondary | ICD-10-CM | POA: Diagnosis not present

## 2017-05-29 DIAGNOSIS — E538 Deficiency of other specified B group vitamins: Secondary | ICD-10-CM | POA: Diagnosis not present

## 2017-05-29 DIAGNOSIS — K219 Gastro-esophageal reflux disease without esophagitis: Secondary | ICD-10-CM

## 2017-05-29 MED ORDER — RANITIDINE HCL 300 MG PO CAPS: 300.0000 mg | ORAL_CAPSULE | Freq: Every evening | ORAL | 0 refills | Status: DC

## 2017-05-29 MED ORDER — CYANOCOBALAMIN 1000 MCG/ML IJ SOLN
1000.0000 ug | Freq: Once | INTRAMUSCULAR | Status: AC
  Administered 2017-05-29: 1000 ug via INTRAMUSCULAR

## 2017-05-29 MED ORDER — DEXILANT 60 MG PO CPDR: 60.0000 mg | DELAYED_RELEASE_CAPSULE | Freq: Every day | ORAL | 2 refills | Status: DC

## 2017-05-29 MED ORDER — BACLOFEN 20 MG PO TABS: 10.0000 mg | ORAL_TABLET | Freq: Three times a day (TID) | ORAL | 0 refills | Status: DC | PRN

## 2017-05-29 NOTE — Progress Notes (Signed)
Name: Jessica Carroll   MRN: [id number]    DOB: 1971-10-19   Date:05/29/2017       Progress Note  Subjective  Chief Complaint  Chief Complaint  Patient presents with  . Muscle Pain    pulled muscle in chest on right side for about a week,alot of heavy lifting at work may be the cause       HPI  GERD: she was seen for worsening of GERD by Raelyn Ensign one month ago and was switched from [place] to Pantoprazole, nausea has resolved, but pain on epigastric area and heart burn has been worse. Pain is described as burning, no nausea or vomiting, she has some odynophagia, but no dysphagia. She was seen by GI in the past but has been years. She has lost a few pounds. There is increase stress in her life  Chest Wall pain: symptoms started about one week ago, worse with movement of chest wall. 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 She states pain is intermittent, described as aching pain, can last up to one hour, radiates to right shoulder, not associated SOB, nausea or diaphoresis. Pain improves with rest.   Patient Active Problem List   Diagnosis Date Noted  . Pruritus ani 04/22/2017  . Vaginal pain 01/10/2017  . Recurrent vaginitis 01/10/2017  . Hemorrhoids, external without complications 37/09/6268  . Nausea in adult patient 02/24/2016  . Endometriosis determined by laparoscopy 01/25/2016  . Dysmenorrhea 12/30/2015  . Dyspareunia, female 12/30/2015  . Chronic constipation 08/14/2015  . Yeast vaginitis 08/14/2015  . Bilateral ganglion cysts of wrists 08/14/2015  . Mild mitral regurgitation 05/19/2015  . B12 deficiency 05/18/2015  . Asthma, moderate 05/18/2015  . Anxiety 05/18/2015  . Insomnia, persistent 05/18/2015  . Eczema of hand 05/18/2015  . Viral keratitis 05/18/2015  . Migraine without aura and without status migrainosus, not intractable 05/18/2015  . Numerous moles  05/18/2015  . Allergic rhinitis 05/18/2015  . History of corneal transplant 05/18/2015  . Gastroesophageal reflux disease without esophagitis 12/16/2009    Past Surgical History:  Procedure Laterality Date  . CORNEAL TRANSPLANT Right 01/07/2013  . eyelid surgery Right June 19, 2015   [facility] by Dr. Norlene Duel  . LAPAROSCOPIC BILATERAL SALPINGECTOMY N/A 01/25/2016   Procedure: LAPAROSCOPIC BILATERAL SALPINGECTOMY, ADHESIOLYSIS, LAPAROSCOPIC EXCISION/FULGERATION ENDOMETRIOSIS ;  Surgeon: [name], MD;  Location: [facility] ORS;  Service: Gynecology;  Laterality: N/A;  . Laparoscopic excision and fulguration of endometriosis N/A    bladder flap and uterosacral ligament endometriosis, excised and cauterized  . UPPER GI ENDOSCOPY  07/19/2012    Family History  Problem Relation Age of Onset  . Adopted: Yes  . Hyperlipidemia Mother   . Hypertension Mother   . Diabetes Father   . Heart failure Father 77       hear attack  . Cancer Neg Hx   . Breast cancer Neg Hx     Social History   Social History  . Marital status: Single    Spouse name: N/A  . Number of children: N/A  . Years of education: 35   Occupational History  . textile    Social History Main Topics  . Smoking status: Never Smoker  . Smokeless tobacco: Never Used  . Alcohol use 0.0 oz/week     Comment: occ. wine  . Drug use: No  . Sexual activity: Yes  Partners: Male    Birth control/ protection: None, Surgical   Other Topics Concern  . Not on file   Social History Narrative  . No narrative on file     Current Outpatient Prescriptions:  .  albuterol (PROVENTIL HFA) 108 (90 Base) MCG/ACT inhaler, Inhale 2 puffs into the lungs every 6 (six) hours as needed for wheezing or shortness of breath., Disp: 1 Inhaler, Rfl: 1 .  ALPRAZolam (XANAX) 0.5 MG tablet, Take 1 tablet (0.5 mg total) by mouth 2 (two) times daily as needed for anxiety., Disp: 25 tablet, Rfl: 0 .  DEXILANT 60 MG capsule, , Disp: , Rfl:  5 .  Fluticasone-Salmeterol (ADVAIR) 250-50 MCG/DOSE AEPB, Inhale 2 puffs into the lungs 2 (two) times daily., Disp: 60 each, Rfl: 5 .  hydrocortisone-pramoxine (PROCTOFOAM HC) rectal foam, Place 1 applicator rectally 2 (two) times daily., Disp: 10 g, Rfl: 0 .  ibuprofen (ADVIL,MOTRIN) 800 MG tablet, TAKE 1 TABLET(800 MG) BY MOUTH THREE TIMES DAILY AS NEEDED, Disp: 90 tablet, Rfl: 0 .  linaclotide (LINZESS) 72 MCG capsule, TAKE 1 CAPSULE(72 MCG) BY MOUTH DAILY BEFORE BREAKFAST, Disp: 30 capsule, Rfl: 5 .  MULTIPLE VITAMIN PO, Take 1 tablet by mouth daily., Disp: , Rfl:  .  prednisoLONE acetate (PRED FORTE) 1 % ophthalmic suspension, Place 1 drop into the right eye daily. , Disp: , Rfl:  .  promethazine (PHENERGAN) 12.5 MG tablet, Take 1 tablet (12.5 mg total) by mouth every 8 (eight) hours as needed for nausea or vomiting., Disp: 20 tablet, Rfl: 0 .  rizatriptan (MAXALT) 10 MG tablet, Take 1 tablet (10 mg total) by mouth 2 (two) times daily as needed., Disp: 10 tablet, Rfl: 2 .  triamcinolone cream (KENALOG) 0.1 %, Apply 1 application topically 2 (two) times daily as needed., Disp: 30 g, Rfl: 0 .  valACYclovir (VALTREX) 500 MG tablet, Take 1 tablet by mouth 2 (two) times daily., Disp: , Rfl: 0 .  zolpidem (AMBIEN) 5 MG tablet, Take 1 tablet (5 mg total) by mouth at bedtime as needed for sleep., Disp: 30 tablet, Rfl: 2  Allergies  Allergen Reactions  . Penicillins Nausea Only     ROS  Ten systems reviewed and is negative except as mentioned in HPI   Objective  Vitals:   05/29/17 1342  BP: 112/68  Pulse: 82  Resp: 16  Temp: 97.9 F (36.6 C)  SpO2: 96%  Weight: 122 lb 1 oz (55.4 kg)  Height: 5\' 6"  (1.676 m)    Body mass index is 19.7 kg/m.  Physical Exam  Constitutional: Patient appears well-developed and well-nourished. No distress.  HEENT: head atraumatic, normocephalic, pupils equal and reactive to light,  neck supple, throat within normal limits Cardiovascular: Normal  rate, regular rhythm and normal heart sounds.  No murmur heard. No BLE edema. Pulmonary/Chest: Effort normal and breath sounds normal. No respiratory distress. Abdominal: Soft.  There is tenderness on epigastric area. Muscular Skeletal: pain with internal rotation of right shoulder and during palpation of right upper chest wall, not over costochondral joint Psychiatric: Patient has a normal mood and affect. behavior is normal. Judgment and thought content normal.  Recent Results (from the past 2160 hour(s))  POCT urinalysis dipstick     Status: Abnormal   Collection Time: 03/30/17  1:41 PM  Result Value Ref Range   Color, UA yellow    Clarity, UA clear    Glucose, UA neg    Bilirubin, UA neg    Ketones, UA neg  Spec Grav, UA <=1.005 (A) 1.010 - 1.025   Blood, UA neg    pH, UA 6.0 5.0 - 8.0   Protein, UA neg    Urobilinogen, UA negative (A) 0.2 or 1.0 E.U./dL   Nitrite, UA neg    Leukocytes, UA Negative Negative  Hemoglobin A1c     Status: None   Collection Time: 03/30/17  2:28 PM  Result Value Ref Range   Hgb A1c MFr Bld 5.3 4.8 - 5.6 %    Comment:          Pre-diabetes: 5.7 - 6.4          Diabetes: >6.4          Glycemic control for adults with diabetes: <7.0    Est. average glucose Bld gHb Est-mCnc 105 mg/dL  NuSwab Vaginitis Plus (VG+)     Status: None   Collection Time: 03/30/17  2:40 PM  Result Value Ref Range   Atopobium vaginae Low - 0 Score   BVAB 2 Moderate - 1 Score   Megasphaera 1 Low - 0 Score    Comment: Calculate total score by adding the 3 individual bacterial vaginosis (BV) marker scores together.  Total score is interpreted as follows: Total score 0-1: Indicates the absence of BV. Total score   2: Indeterminate for BV. Additional clinical                  data should be evaluated to establish a                  diagnosis. Total score 3-6: Indicates the presence of BV. This test was developed and its performance characteristics determined by [facility].   It has not been cleared or approved by the Food and Drug Administration.  The FDA has determined that such clearance or approval is not necessary.    Candida albicans, NAA Negative Negative   Candida glabrata, NAA Negative Negative    Comment: This test was developed and its performance characteristics determined by [facility].  It has not been cleared or approved by the Food and Drug Administration.  The FDA has determined that such clearance or approval is not necessary.    Trich vag by NAA Negative Negative   Chlamydia trachomatis, NAA Negative Negative   Neisseria gonorrhoeae, NAA Negative Negative  Urine culture     Status: None   Collection Time: 03/30/17  2:40 PM  Result Value Ref Range   Urine Culture, Routine Final report    Organism ID, Bacteria No growth   H. pylori breath test     Status: None   Collection Time: 05/11/17  2:43 PM  Result Value Ref Range   H. pylori Breath Test NOT DETECTED Not Detected    Comment:   Antimicrobials, proton pump inhibitors, and bismuth preparations are known to suppress H. pylori, and ingestion of these prior to H. pylori diagnostic testing may lead to false negative results. If clinically indicated, the test may be repeated on a new specimen obtained two weeks after discontinuing treatment.         PHQ2/9: Depression screen [facility] 2/9 01/24/2017 09/02/2016 06/20/2016 03/17/2016 08/14/2015  Decreased Interest 0 0 0 0 0  Down, Depressed, Hopeless 0 0 0 0 0  PHQ - 2 Score 0 0 0 0 0  Altered sleeping - - - - -  Tired, decreased energy - - - - -  Change in appetite - - - - -  Feeling bad or failure about  yourself  - - - - -  Trouble concentrating - - - - -  Moving slowly or fidgety/restless - - - - -  Suicidal thoughts - - - - -  PHQ-9 Score - - - - -  Difficult doing work/chores - - - - -     Fall Risk: Fall Risk  01/24/2017 09/02/2016 06/20/2016 03/17/2016 12/17/2015  Falls in the past year? No No No No No  Risk for fall due to : - - - - -      Assessment & Plan  1. Gastroesophageal reflux disease without esophagitis  - DEXILANT 60 MG capsule; Take 1 capsule (60 mg total) by mouth daily.  Dispense: 30 capsule; Refill: 2 - ranitidine (ZANTAC) 300 MG capsule; Take 1 capsule (300 mg total) by mouth every evening.  Dispense: 30 capsule; Refill: 0 - Ambulatory referral to Gastroenterology  2. Chest wall pain  Likely from change of work station, advised her not to take NSAID's because of GERD, try Tylenol and we will add Baclofen prn   - baclofen (LIORESAL) 20 MG tablet; Take 0.5-1 tablets (10-20 mg total) by mouth 3 (three) times daily as needed for muscle spasms. When off work take more than qhs  Dispense: 30 each; Refill: 0

## 2017-05-29 NOTE — Addendum Note (Signed)
Addended by: Inda Coke on: 05/29/2017 02:31 PM   Modules accepted: Orders

## 2017-06-08 ENCOUNTER — Other Ambulatory Visit: Payer: Self-pay | Admitting: Family Medicine

## 2017-06-08 DIAGNOSIS — K219 Gastro-esophageal reflux disease without esophagitis: Secondary | ICD-10-CM

## 2017-06-11 NOTE — Telephone Encounter (Signed)
Pt referred to GI and switched back to Dexilant at last visit with PCP Dr. Ancil Boozer.

## 2017-06-13 ENCOUNTER — Encounter: Payer: 59 | Admitting: Obstetrics and Gynecology

## 2017-06-21 ENCOUNTER — Ambulatory Visit (INDEPENDENT_AMBULATORY_CARE_PROVIDER_SITE_OTHER): Payer: 59 | Admitting: Obstetrics and Gynecology

## 2017-06-21 ENCOUNTER — Encounter: Payer: Self-pay | Admitting: Obstetrics and Gynecology

## 2017-06-21 VITALS — BP 104/58 | HR 67 | Ht 66.0 in | Wt 126.1 lb

## 2017-06-21 DIAGNOSIS — N809 Endometriosis, unspecified: Secondary | ICD-10-CM | POA: Diagnosis not present

## 2017-06-21 DIAGNOSIS — N946 Dysmenorrhea, unspecified: Secondary | ICD-10-CM

## 2017-06-21 DIAGNOSIS — N941 Unspecified dyspareunia: Secondary | ICD-10-CM

## 2017-06-21 NOTE — Patient Instructions (Signed)
1. Transvaginal hysterectomy is to be scheduled for management of symptomatic endometriosis 2. Return for preop appointment the week before surgery   Vaginal Hysterectomy, Care After Refer to this sheet in the next few weeks. These instructions provide you with information about caring for yourself after your procedure. Your health care provider may also give you more specific instructions. Your treatment has been planned according to current medical practices, but problems sometimes occur. Call your health care provider if you have any problems or questions after your procedure. What can I expect after the procedure? After the procedure, it is common to have:  Pain.  Soreness and numbness in your incision areas.  Vaginal bleeding and discharge.  Constipation.  Temporary problems emptying the bladder.  Feelings of sadness or other emotions.  Follow these instructions at home: Medicines  Take over-the-counter and prescription medicines only as told by your health care provider.  If you were prescribed an antibiotic medicine, take it as told by your health care provider. Do not stop taking the antibiotic even if you start to feel better.  Do not drive or operate heavy machinery while taking prescription pain medicine. Activity  Return to your normal activities as told by your health care provider. Ask your health care provider what activities are safe for you.  Get regular exercise as told by your health care provider. You may be told to take short walks every day and go farther each time.  Do not lift anything that is heavier than 10 lb (4.5 kg). General instructions   Do not put anything in your vagina for 6 weeks after your surgery or as told by your health care provider. This includes tampons and douches.  Do not have sex until your health care provider says you can.  Do not take baths, swim, or use a hot tub until your health care provider approves.  Drink enough fluid  to keep your urine clear or pale yellow.  Do not drive for 24 hours if you were given a sedative.  Keep all follow-up visits as told by your health care provider. This is important. Contact a health care provider if:  Your pain medicine is not helping.  You have a fever.  You have redness, swelling, or pain at your incision site.  You have blood, pus, or a bad-smelling discharge from your vagina.  You continue to have difficulty urinating. Get help right away if:  You have severe abdominal or back pain.  You have heavy bleeding from your vagina.  You have chest pain or shortness of breath. This information is not intended to replace advice given to you by your health care provider. Make sure you discuss any questions you have with your health care provider. Document Released: 03/21/2016 Document Revised: 05/05/2016 Document Reviewed: 12/13/2015 Elsevier Interactive Patient Education  [employer].

## 2017-06-21 NOTE — Progress Notes (Signed)
Chief complaint: 1. Follow-up on endometriosis  Jessica Carroll presents in the company of her husband for follow-up on endometriosis. She is interested in proceeding with definitive surgery. Jessica Carroll was diagnosed with endometriosis by laparoscopy. She continues to have dysmenorrhea in the form of central cramping and perimenstrual low backache as well as experiencing deep thrusting dyspareunia. She has no lateralization of her pain. Her fallopian tubes were removed through bilateral salpingectomy for contraception; it is at that surgery when endometriosis was diagnosed.  OBJECTIVE: BP (!) 104/58   Pulse 67   Ht 5\' 6"  (1.676 m)   Wt 126 lb 1.6 oz (57.2 kg)   LMP 06/18/2017   BMI 20.35 kg/m  Pleasant well-appearing female in no acute distress. Alert and oriented. Abdomen: Soft, nontender without organomegaly Pelvic: External genitalia-normal BUS-normal Bimanual-cervical motion tenderness 3/4; uterine tenderness 3/4; uterus is midplane to retroverted, normal size and shape; adnexa are nonpalpable and nontender  ASSESSMENT: 1. Symptomatic endometriosis diagnosed at the time of prior laparoscopy 2. Patient desires definitive treatment.  PLAN: 1. Transvaginal hysterectomy is vaginal 2. Patient will return for preop appointment the week before surgery 3. Multiple questions regarding perioperative and postoperative management of her care were addressed.  A total of 25 minutes were spent face-to-face with the patient during this encounter and over half of that time involved counseling and coordination of care.  [name], MD  Note: This dictation was prepared with Dragon dictation along with smaller phrase technology. Any transcriptional errors that result from this process are unintentional.

## 2017-06-29 ENCOUNTER — Encounter: Payer: Self-pay | Admitting: Family Medicine

## 2017-06-29 ENCOUNTER — Encounter: Payer: 59 | Admitting: Obstetrics and Gynecology

## 2017-06-30 ENCOUNTER — Other Ambulatory Visit: Payer: Self-pay | Admitting: Family Medicine

## 2017-07-03 ENCOUNTER — Telehealth: Payer: Self-pay | Admitting: Obstetrics and Gynecology

## 2017-07-03 NOTE — Telephone Encounter (Signed)
Pt aware I have not seen any fmla paperwork. She states she gave it to [facility] last week. [place] is out of the office until tomorrow afternoon. Will send message to her and call pt asap.

## 2017-07-03 NOTE — Telephone Encounter (Signed)
Patient called wanting to speak with Joyice Faster in regards to her (disabliity) paper work she dropped off last week. Patient would just like a call to know when she will get the paperwork being that her job needs the forms.  Please advise.

## 2017-07-04 NOTE — Telephone Encounter (Signed)
I put them on your desk. I spoke with patient, she is aware she will get a call when they are ready.

## 2017-07-05 NOTE — Telephone Encounter (Signed)
LM - fmla complete and up front for p/u.

## 2017-07-13 ENCOUNTER — Ambulatory Visit (INDEPENDENT_AMBULATORY_CARE_PROVIDER_SITE_OTHER): Payer: 59 | Admitting: Gastroenterology

## 2017-07-13 ENCOUNTER — Telehealth: Payer: Self-pay

## 2017-07-13 ENCOUNTER — Other Ambulatory Visit: Payer: Self-pay

## 2017-07-13 ENCOUNTER — Encounter: Payer: Self-pay | Admitting: Gastroenterology

## 2017-07-13 VITALS — BP 113/68 | HR 68 | Temp 98.3°F | Ht 66.0 in | Wt 124.2 lb

## 2017-07-13 DIAGNOSIS — K5900 Constipation, unspecified: Secondary | ICD-10-CM

## 2017-07-13 DIAGNOSIS — R112 Nausea with vomiting, unspecified: Secondary | ICD-10-CM

## 2017-07-13 DIAGNOSIS — R110 Nausea: Secondary | ICD-10-CM

## 2017-07-13 DIAGNOSIS — R140 Abdominal distension (gaseous): Secondary | ICD-10-CM

## 2017-07-13 DIAGNOSIS — R1011 Right upper quadrant pain: Secondary | ICD-10-CM

## 2017-07-13 NOTE — Progress Notes (Signed)
Jonathon Bellows MD, MRCP(U.K) [address]  [place]  [place], [place] [address]  Main: [phone]  Fax: [phone]   Gastroenterology Consultation  Referring Provider:     Steele Sizer, MD Primary Care Physician:  Steele Sizer, MD Primary Gastroenterologist:  Dr. Jonathon Bellows  Reason for Consultation:     GERD        HPI:   Jessica Carroll is a 46 y.o. y/o female referred for consultation & management  by Dr. Steele Sizer, MD.    H Pylori breath test negative.   She says she has had nausea for a few months , some days has none. 2-3 times a week. It lasts a few hours, peptobismol helps. She takes Dexilant daily , does not help the nausea.   Denies any abdominal pain regularly , rarely has epigastric pain which or may not be associated with nausea. . Sometimes she wakes up nauseous, no worse after eating. She does not smokes, denies any exposure to marijuana. She does have migraines, when she has migraines gets nauseous. Denies any weight loss. Does not every vomit. She is going to undergo a cervical surgery in August . She does wake up in the night feeling nauseous. Denies use of NSAID's.   She has a bowel movement daily , she is on linzess, sometimes is soft and sometimes is hard. Says she can be nauseous even when not having any constipated. A bowel movement she feels better with less nasea . She does have bloating when she is nauseous.   Past Medical History:  Diagnosis Date  . Anxiety   . Asthma 2011  . Diffuse cystic mastopathy   . Endometriosis   . GERD (gastroesophageal reflux disease)   . Headache     Past Surgical History:  Procedure Laterality Date  . CORNEAL TRANSPLANT Right 01/07/2013  . eyelid surgery Right June 19, 2015   [facility] by Dr. Norlene Duel  . LAPAROSCOPIC BILATERAL SALPINGECTOMY N/A 01/25/2016   Procedure: LAPAROSCOPIC BILATERAL SALPINGECTOMY, ADHESIOLYSIS, LAPAROSCOPIC EXCISION/FULGERATION ENDOMETRIOSIS ;  Surgeon: [name], MD;  Location: [facility] ORS;  Service: Gynecology;  Laterality: N/A;  . Laparoscopic excision and fulguration of endometriosis N/A    bladder flap and uterosacral ligament endometriosis, excised and cauterized  . UPPER GI ENDOSCOPY  07/19/2012    Prior to Admission medications   Medication Sig Start Date End Date Taking? Authorizing Provider  albuterol (PROVENTIL HFA) 108 (90 Base) MCG/ACT inhaler Inhale 2 puffs into the lungs every 6 (six) hours as needed for wheezing or shortness of breath. 04/18/17  Yes Sowles, Drue Stager, MD  ALPRAZolam Duanne Moron) 0.5 MG tablet Take 1 tablet (0.5 mg total) by mouth 2 (two) times daily as needed for anxiety. 04/10/17  Yes Hubbard Hartshorn, FNP  baclofen (LIORESAL) 20 MG tablet Take 0.5-1 tablets (10-20 mg total) by mouth 3 (three) times daily as needed for muscle spasms. When off work take more than qhs 05/29/17  Yes Sowles, Drue Stager, MD  DEXILANT 60 MG capsule Take 1 capsule (60 mg total) by mouth daily. 05/29/17  Yes Sowles, Drue Stager, MD  Fluticasone-Salmeterol (ADVAIR) 250-50 MCG/DOSE AEPB Inhale 2 puffs into the lungs 2 (two) times daily. 04/18/17  Yes Sowles, Drue Stager, MD  ibuprofen (ADVIL,MOTRIN) 800 MG tablet TAKE 1 TABLET(800 MG) BY MOUTH THREE TIMES DAILY AS NEEDED 07/23/16  Yes Sowles, Drue Stager, MD  linaclotide [facility]) 72 MCG capsule TAKE 1 CAPSULE(72 MCG) BY MOUTH DAILY BEFORE BREAKFAST 01/23/17  Yes Steele Sizer, MD  MULTIPLE  VITAMIN PO Take 1 tablet by mouth daily.   Yes [provider]  prednisoLONE acetate (PRED FORTE) 1 % ophthalmic suspension Place 1 drop into the right eye daily.  01/19/17  Yes [provider]  promethazine (PHENERGAN) 12.5 MG tablet Take 1 tablet (12.5 mg total) by mouth every 8 (eight) hours as needed for nausea or vomiting. 01/09/17  Yes Sowles, Drue Stager, MD  rizatriptan (MAXALT) 10 MG tablet Take 1 tablet (10 mg total) by mouth 2 (two) times daily as needed. 01/09/17  Yes Sowles, Drue Stager, MD  triamcinolone cream  (KENALOG) 0.1 % Apply 1 application topically 2 (two) times daily as needed. 01/09/17  Yes Sowles, Drue Stager, MD  valACYclovir (VALTREX) 500 MG tablet Take 1 tablet by mouth 2 (two) times daily. 11/30/15  Yes Dionne Bucy, MD  ranitidine (ZANTAC) 300 MG capsule Take 1 capsule (300 mg total) by mouth every evening. Patient not taking: Reported on 07/13/2017 05/29/17   Steele Sizer, MD    Family History  Problem Relation Age of Onset  . Adopted: Yes  . Hyperlipidemia Mother   . Hypertension Mother   . Diabetes Father   . Heart failure Father 68       hear attack  . Cancer Neg Hx   . Breast cancer Neg Hx      Social History  Substance Use Topics  . Smoking status: Never Smoker  . Smokeless tobacco: Never Used  . Alcohol use 0.0 oz/week     Comment: occ. wine    Allergies as of 07/13/2017 - Review Complete 07/13/2017  Allergen Reaction Noted  . Penicillins Nausea Only 03/31/2015    Review of Systems:    All systems reviewed and negative except where noted in HPI.   Physical Exam:  BP 113/68   Pulse 68   Temp 98.3 F (36.8 C) (Oral)   Ht 5\' 6"  (1.676 m)   Wt 124 lb 3.2 oz (56.3 kg)   LMP 06/18/2017   BMI 20.05 kg/m  Patient's last menstrual period was 06/18/2017. Psych:  Alert and cooperative. Normal mood and affect. General:   Alert,  Well-developed, well-nourished, pleasant and cooperative in NAD Head:  Normocephalic and atraumatic. Eyes:  Sclera clear, no icterus.   Conjunctiva pink. Ears:  Normal auditory acuity. Nose:  No deformity, discharge, or lesions. Mouth:  No deformity or lesions,oropharynx pink & moist. Neck:  Supple; no masses or thyromegaly. Lungs:  Respirations even and unlabored.  Clear throughout to auscultation.   No wheezes, crackles, or rhonchi. No acute distress. Heart:  Regular rate and rhythm; no murmurs, clicks, rubs, or gallops. Abdomen:  Normal bowel sounds.  No bruits.  Soft, non-tender and non-distended without masses, hepatosplenomegaly  or hernias noted.  No guarding or rebound tenderness.    Extremities:  No clubbing or edema.  No cyanosis. Neurologic:  Alert and oriented x3;  grossly normal neurologically.Marland Kitchen Psych:  Alert and cooperative. Normal mood and affect.  Imaging Studies: No results found.  Assessment and Plan:   Jessica Carroll is a 46 y.o. y/o female has been referred for GERD. Her symptoms consist of bloating , nausea. She also has constipation . It is likely her symptoms are possibly from a combination of GERD +/- constipation +/- bloating  Plan 1. RUQ USG to r/o gall stones and HIDA 2. Increase Linzess to two tablets a day  3. Zantac 150 mg at night and continue Dexilant  4. GERD patient information 5. High fiber diet  6. If no  better at next visit will consider EGD  Follow up in 8 weeks   Dr Jonathon Bellows MD,MRCP(U.K)

## 2017-07-13 NOTE — Telephone Encounter (Signed)
Pt has not met her insurance deductible to have HIDA Scan and Abd ultrasound.  She is scheduled to have a hysterectomy at the end of the month and will meet her deductible then.  She will follow up with [place] after her hysterectomy and if her symptoms have not resolved she will have test done.

## 2017-07-14 ENCOUNTER — Encounter: Payer: Self-pay | Admitting: Family Medicine

## 2017-07-14 ENCOUNTER — Other Ambulatory Visit: Payer: Self-pay | Admitting: Family Medicine

## 2017-07-14 DIAGNOSIS — K5909 Other constipation: Secondary | ICD-10-CM

## 2017-07-14 MED ORDER — LINACLOTIDE 72 MCG PO CAPS: ORAL_CAPSULE | ORAL | 5 refills | Status: DC

## 2017-07-20 ENCOUNTER — Telehealth: Payer: Self-pay | Admitting: Gastroenterology

## 2017-07-20 NOTE — Telephone Encounter (Signed)
Patient left a voice message for you to call her regarding some medication. She didn't say the name of it. Please call

## 2017-07-21 ENCOUNTER — Other Ambulatory Visit: Payer: Self-pay

## 2017-07-21 DIAGNOSIS — K219 Gastro-esophageal reflux disease without esophagitis: Secondary | ICD-10-CM

## 2017-07-21 MED ORDER — RANITIDINE HCL 300 MG PO CAPS: 300.0000 mg | ORAL_CAPSULE | Freq: Every evening | ORAL | 0 refills | Status: DC

## 2017-07-25 ENCOUNTER — Telehealth: Payer: Self-pay | Admitting: Obstetrics and Gynecology

## 2017-07-25 ENCOUNTER — Encounter: Payer: Self-pay | Admitting: Obstetrics and Gynecology

## 2017-07-25 ENCOUNTER — Ambulatory Visit (INDEPENDENT_AMBULATORY_CARE_PROVIDER_SITE_OTHER): Payer: 59 | Admitting: Obstetrics and Gynecology

## 2017-07-25 VITALS — BP 89/52 | HR 68 | Ht 66.0 in | Wt 125.3 lb

## 2017-07-25 DIAGNOSIS — N809 Endometriosis, unspecified: Secondary | ICD-10-CM

## 2017-07-25 DIAGNOSIS — N946 Dysmenorrhea, unspecified: Secondary | ICD-10-CM

## 2017-07-25 DIAGNOSIS — N941 Unspecified dyspareunia: Secondary | ICD-10-CM

## 2017-07-25 DIAGNOSIS — Z01818 Encounter for other preprocedural examination: Secondary | ICD-10-CM

## 2017-07-25 NOTE — Telephone Encounter (Signed)
Patient called and stated that she has some concerns with her BP being "so low" The patient was also concerned that nothing was mentioned about it while she was here for her appointment 07/25/17. The patient did not disclose any other information other than wanting a call back. Please advise.

## 2017-07-25 NOTE — Patient Instructions (Signed)
Return one week after surgery for postop check on 08/08/2017

## 2017-07-25 NOTE — Progress Notes (Signed)
Subjective:  PREOPERATIVE HISTORY AND PHYSICAL  Date of surgery: 07/31/2017 Surgical procedure: Transvaginal hysterectomy Diagnosis: Symptomatic endometriosis   Patient is a 46 y.o. [id number] scheduled for transvaginal hysterectomy on 07/31/2017. She has previously undergone bilateral salpingectomy for sterilization. Because the patient is experiencing persistent chronic pelvic pain, she desires definitive treatment at this time.  Patient also is experiencing off again on-again discomfort in the pelvis especially associated with intercourse over the past 6 months. She does have history of endometriosis which was diagnosed at the time of her tubal ligation. Bowel function is normal. She does not have pain with bowel movements Bladder function is normal. She denies UTI symptoms.  Gynecologic History Menarche-age 35. Cycles monthly. Duration of flow 4 days. History of dysmenorrhea, moderate, colon typically does not take medications; does report perimenstrual low backache without radiation into the buttocks or thighs. History of deep thrusting dyspareunia. No family history of ovarian cancer. No family history of endometriosis No history of abnormal Pap smears. No history of PID/STI Patient's last menstrual period was 12/09/2015. Contraception: bilateral salpingectomy Last Pap: Normal.   OB History    Gravida Para Term Preterm AB Living   0 0 0 0 0 0   SAB TAB Ectopic Multiple Live Births   0 0 0 0        Obstetric Comments   1st Menstrual Cycle:  12         Patient's last menstrual period was 07/11/2017 (exact date).    Past Medical History:  Diagnosis Date  . Anxiety   . Asthma 2011  . Diffuse cystic mastopathy   . Endometriosis   . GERD (gastroesophageal reflux disease)   . Headache     Past Surgical History:  Procedure Laterality Date  . CORNEAL TRANSPLANT Right 01/07/2013  . eyelid surgery Right June 19, 2015   [facility] by Dr. Norlene Duel  .  LAPAROSCOPIC BILATERAL SALPINGECTOMY N/A 01/25/2016   Procedure: LAPAROSCOPIC BILATERAL SALPINGECTOMY, ADHESIOLYSIS, LAPAROSCOPIC EXCISION/FULGERATION ENDOMETRIOSIS ;  Surgeon: [name], MD;  Location: [facility] ORS;  Service: Gynecology;  Laterality: N/A;  . Laparoscopic excision and fulguration of endometriosis N/A    bladder flap and uterosacral ligament endometriosis, excised and cauterized  . UPPER GI ENDOSCOPY  07/19/2012    OB History  Gravida Para Term Preterm AB Living  0 0 0 0 0 0  SAB TAB Ectopic Multiple Live Births  0 0 0 0        Obstetric Comments  1st Menstrual Cycle:  12       Social History   Social History  . Marital status: Single    Spouse name: N/A  . Number of children: N/A  . Years of education: 7   Occupational History  . textile    Social History Main Topics  . Smoking status: Never Smoker  . Smokeless tobacco: Never Used  . Alcohol use 0.0 oz/week     Comment: occ. wine  . Drug use: No  . Sexual activity: Yes    Partners: Male    Birth control/ protection: None, Surgical   Other Topics Concern  . None   Social History Narrative  . None    Family History  Problem Relation Age of Onset  . Adopted: Yes  . Hyperlipidemia Mother   . Hypertension Mother   . Diabetes Father   . Heart failure Father 70       hear attack  . Cancer Neg Hx   . Breast cancer Neg Hx      (  Not in a hospital admission)  Allergies  Allergen Reactions  . Penicillins Nausea Only    Review of Systems Constitutional: No recent fever/chills/sweats Respiratory: No recent cough/bronchitis Cardiovascular: No chest pain Gastrointestinal: No recent nausea/vomiting/diarrhea Genitourinary: No UTI symptoms Hematologic/lymphatic:No history of coagulopathy or recent blood thinner use    Objective:    BP (!) 89/52   Pulse 68   Ht 5\' 6"  (1.676 m)   Wt 125 lb 4.8 oz (56.8 kg)   LMP 07/11/2017 (Exact Date)   BMI 20.22 kg/m   General:   Normal  Skin:    normal  HEENT:  Normal  Neck:  Supple without Adenopathy or Thyromegaly  Lungs:   Heart:              Breasts:   Abdomen:  Pelvis:  M/S   Extremeties:  Neuro:    clear to auscultation bilaterally   Normal without murmur   Not Examined   soft, non-tender; bowel sounds normal; no masses,  no organomegaly   Exam deferred to OR  No CVAT  Warm/Dry   Normal          Assessment:     symptomatic endometriosis   Plan:  TVH   Counseling: Procedure, risks, reasons, benefits and complications (including injury to bowel, bladder, major blood vessel, ureter, bleeding, possibility of transfusion, infection, or fistula formation) reviewed in detail. Consent signed. Preop testing ordered. Instructions reviewed, including NPO after midnight.  [name], MD  Note: This dictation was prepared with Dragon dictation along with smaller phrase technology. Any transcriptional errors that result from this process are unintentional.

## 2017-07-25 NOTE — H&P (Signed)
Subjective:  PREOPERATIVE HISTORY AND PHYSICAL  Date of surgery: 07/31/2017 Surgical procedure: Transvaginal hysterectomy Diagnosis: Symptomatic endometriosis   Patient is a 46 y.o. [id number] scheduled for transvaginal hysterectomy on 07/31/2017. She has previously undergone bilateral salpingectomy for sterilization. Because the patient is experiencing persistent chronic pelvic pain, she desires definitive treatment at this time.  Patient also is experiencing off again on-again discomfort in the pelvis especially associated with intercourse over the past 6 months. She does have history of endometriosis which was diagnosed at the time of her tubal ligation. Bowel function is normal. She does not have pain with bowel movements Bladder function is normal. She denies UTI symptoms.  Gynecologic History Menarche-age 75. Cycles monthly. Duration of flow 4 days. History of dysmenorrhea, moderate, colon typically does not take medications; does report perimenstrual low backache without radiation into the buttocks or thighs. History of deep thrusting dyspareunia. No family history of ovarian cancer. No family history of endometriosis No history of abnormal Pap smears. No history of PID/STI Patient's last menstrual period was 12/09/2015. Contraception: bilateral salpingectomy Last Pap: Normal.   OB History    Gravida Para Term Preterm AB Living   0 0 0 0 0 0   SAB TAB Ectopic Multiple Live Births   0 0 0 0        Obstetric Comments   1st Menstrual Cycle:  12         Patient's last menstrual period was 07/11/2017 (exact date).    Past Medical History:  Diagnosis Date  . Anxiety   . Asthma 2011  . Diffuse cystic mastopathy   . Endometriosis   . GERD (gastroesophageal reflux disease)   . Headache     Past Surgical History:  Procedure Laterality Date  . CORNEAL TRANSPLANT Right 01/07/2013  . eyelid surgery Right June 19, 2015   [facility] by Dr. Norlene Duel  .  LAPAROSCOPIC BILATERAL SALPINGECTOMY N/A 01/25/2016   Procedure: LAPAROSCOPIC BILATERAL SALPINGECTOMY, ADHESIOLYSIS, LAPAROSCOPIC EXCISION/FULGERATION ENDOMETRIOSIS ;  Surgeon: [name], MD;  Location: [facility] ORS;  Service: Gynecology;  Laterality: N/A;  . Laparoscopic excision and fulguration of endometriosis N/A    bladder flap and uterosacral ligament endometriosis, excised and cauterized  . UPPER GI ENDOSCOPY  07/19/2012    OB History  Gravida Para Term Preterm AB Living  0 0 0 0 0 0  SAB TAB Ectopic Multiple Live Births  0 0 0 0        Obstetric Comments  1st Menstrual Cycle:  12       Social History   Social History  . Marital status: Single    Spouse name: N/A  . Number of children: N/A  . Years of education: 49   Occupational History  . textile    Social History Main Topics  . Smoking status: Never Smoker  . Smokeless tobacco: Never Used  . Alcohol use 0.0 oz/week     Comment: occ. wine  . Drug use: No  . Sexual activity: Yes    Partners: Male    Birth control/ protection: None, Surgical   Other Topics Concern  . None   Social History Narrative  . None    Family History  Problem Relation Age of Onset  . Adopted: Yes  . Hyperlipidemia Mother   . Hypertension Mother   . Diabetes Father   . Heart failure Father 47       hear attack  . Cancer Neg Hx   . Breast cancer Neg Hx      (  Not in a hospital admission)  Allergies  Allergen Reactions  . Penicillins Nausea Only    Review of Systems Constitutional: No recent fever/chills/sweats Respiratory: No recent cough/bronchitis Cardiovascular: No chest pain Gastrointestinal: No recent nausea/vomiting/diarrhea Genitourinary: No UTI symptoms Hematologic/lymphatic:No history of coagulopathy or recent blood thinner use    Objective:    BP (!) 89/52   Pulse 68   Ht 5\' 6"  (1.676 m)   Wt 125 lb 4.8 oz (56.8 kg)   LMP 07/11/2017 (Exact Date)   BMI 20.22 kg/m   General:   Normal  Skin:    normal  HEENT:  Normal  Neck:  Supple without Adenopathy or Thyromegaly  Lungs:   Heart:              Breasts:   Abdomen:  Pelvis:  M/S   Extremeties:  Neuro:    clear to auscultation bilaterally   Normal without murmur   Not Examined   soft, non-tender; bowel sounds normal; no masses,  no organomegaly   Exam deferred to OR  No CVAT  Warm/Dry   Normal          Assessment:     symptomatic endometriosis   Plan:  TVH   Counseling: Procedure, risks, reasons, benefits and complications (including injury to bowel, bladder, major blood vessel, ureter, bleeding, possibility of transfusion, infection, or fistula formation) reviewed in detail. Consent signed. Preop testing ordered. Instructions reviewed, including NPO after midnight.  [name], MD  Note: This dictation was prepared with Dragon dictation along with smaller phrase technology. Any transcriptional errors that result from this process are unintentional.

## 2017-07-26 NOTE — Telephone Encounter (Signed)
Pt aware bp is a little lower than her normal. Advised pt to keep bp log. F/u with pcp.

## 2017-07-27 ENCOUNTER — Encounter
Admission: RE | Admit: 2017-07-27 | Discharge: 2017-07-27 | Disposition: A | Discharge status: Home / Self Care | Payer: 59 | Source: Ambulatory Visit | Attending: Obstetrics and Gynecology | Admitting: Obstetrics and Gynecology

## 2017-07-27 DIAGNOSIS — Z01812 Encounter for preprocedural laboratory examination: Secondary | ICD-10-CM | POA: Insufficient documentation

## 2017-07-27 DIAGNOSIS — N946 Dysmenorrhea, unspecified: Secondary | ICD-10-CM | POA: Insufficient documentation

## 2017-07-27 DIAGNOSIS — Z0183 Encounter for blood typing: Secondary | ICD-10-CM | POA: Insufficient documentation

## 2017-07-27 DIAGNOSIS — N941 Unspecified dyspareunia: Secondary | ICD-10-CM | POA: Insufficient documentation

## 2017-07-27 DIAGNOSIS — N809 Endometriosis, unspecified: Secondary | ICD-10-CM | POA: Diagnosis not present

## 2017-07-27 HISTORY — DX: Depression, unspecified: F32.A

## 2017-07-27 HISTORY — DX: Major depressive disorder, single episode, unspecified: F32.9

## 2017-07-27 LAB — CBC WITH DIFFERENTIAL/PLATELET
BASOS ABS: 0.1 10*3/uL (ref 0–0.1)
BASOS PCT: 1 %
EOS PCT: 1 %
Eosinophils Absolute: 0.1 10*3/uL (ref 0–0.7)
HCT: 34.6 % — ABNORMAL LOW (ref 35.0–47.0)
Hemoglobin: 11.5 g/dL — ABNORMAL LOW (ref 12.0–16.0)
Lymphocytes Relative: 38 %
Lymphs Abs: 2.3 10*3/uL (ref 1.0–3.6)
MCH: 30.9 pg (ref 26.0–34.0)
MCHC: 33.2 g/dL (ref 32.0–36.0)
MCV: 93 fL (ref 80.0–100.0)
MONO ABS: 0.3 10*3/uL (ref 0.2–0.9)
Monocytes Relative: 5 %
Neutro Abs: 3.4 10*3/uL (ref 1.4–6.5)
Neutrophils Relative %: 55 %
PLATELETS: 194 10*3/uL (ref 150–440)
RBC: 3.72 MIL/uL — ABNORMAL LOW (ref 3.80–5.20)
RDW: 12.5 % (ref 11.5–14.5)
WBC: 6.1 10*3/uL (ref 3.6–11.0)

## 2017-07-27 LAB — TYPE AND SCREEN
ABO/RH(D): A POS
Antibody Screen: NEGATIVE

## 2017-07-27 LAB — RAPID HIV SCREEN (HIV 1/2 AB+AG)
HIV 1/2 ANTIBODIES: NONREACTIVE
HIV-1 P24 Antigen - HIV24: NONREACTIVE

## 2017-07-27 NOTE — Patient Instructions (Signed)
Your procedure is scheduled on: July 31, 2017 [facility]) Report to Same Day Surgery 2nd floor medical mall ([place] Entrance-take elevator on left to 2nd floor.  Check in with surgery information desk.) To find out your arrival time please call [phone] between 1PM - 3PM on July 29, 2017 (FRIDAY)  Remember: Instructions that are not followed completely may result in serious medical risk, up to and including death, or upon the discretion of your surgeon and anesthesiologist your surgery may need to be rescheduled.    _x___ 1. Do not eat food or drink liquids after midnight. No gum chewing or hard candies                               __x__ 2. No Alcohol for 24 hours before or after surgery.   __x__3. No Smoking for 24 prior to surgery.   ____  4. Bring all medications with you on the day of surgery if instructed.    __x__ 5. Notify your doctor if there is any change in your medical condition     (cold, fever, infections).     Do not wear jewelry, make-up, hairpins, clips or nail polish.  Do not wear lotions, powders, or perfumes.   Do not shave 48 hours prior to surgery. Men may shave face and neck.  Do not bring valuables to the hospital.    [facility] is not responsible for any belongings or valuables.               Contacts, dentures or bridgework may not be worn into surgery.  Leave your suitcase in the car. After surgery it may be brought to your room.  For patients admitted to the hospital, discharge time is determined by your treatment team                        Patients discharged the day of surgery will not be allowed to drive home.  You will need someone to drive you home and stay with you the night of your procedure.    Please read over the following fact sheets that you were given:   [facility] Preparing for Surgery and or MRSA Information   TAKE THE FOLLOWING MEDICATIONS THE MORNING OF SURGERY WITH A SIP OF WATER :  1. DEXILANT  2. ALPRAZOLAM IF  NEEDED  3.  4.  5.  6.  ____Fleets enema or Magnesium Citrate as directed.   _x___ Use CHG Soap or sage wipes as directed on instruction sheet   _X___ Use inhalers on the day of surgery and bring to hospital day of surgery (USE ALBUTEROL AND [place] )  ____ Stop Metformin and Janumet 2 days prior to surgery.    ____ Take 1/2 of usual insulin dose the night before surgery and none on the morning surgery  _x___ Follow recommendations from Cardiologist, Pulmonologist or PCP regarding          stopping Aspirin, Coumadin, Plavix ,Eliquis, Effient, or Pradaxa, and Pletal.  X____Stop Anti-inflammatories such as Advil, Aleve, Ibuprofen, Motrin, Naproxen, Naprosyn, Goodies powders or aspirin products. OK to take Tylenol  (STOP IBUPROFEN NOW )   _x___ Stop supplements until after surgery.  But may continue Vitamin D, Vitamin B,and multivitamin          ____ Bring C-Pap to the hospital.

## 2017-07-28 LAB — RPR: RPR: NONREACTIVE

## 2017-07-30 MED ORDER — CEFAZOLIN SODIUM-DEXTROSE 2-4 GM/100ML-% IV SOLN
2.0000 g | INTRAVENOUS | Status: AC
  Administered 2017-07-31: 2 g via INTRAVENOUS

## 2017-07-31 ENCOUNTER — Ambulatory Visit: Payer: Commercial Managed Care - HMO | Admitting: Anesthesiology

## 2017-07-31 ENCOUNTER — Observation Stay
Admission: RE | Admit: 2017-07-31 | Discharge: 2017-08-01 | Disposition: A | Discharge status: Home / Self Care | Payer: Commercial Managed Care - HMO | Source: Ambulatory Visit | Attending: Obstetrics and Gynecology | Admitting: Obstetrics and Gynecology

## 2017-07-31 ENCOUNTER — Encounter
Admission: RE | Disposition: A | Discharge status: Home / Self Care | Payer: Self-pay | Source: Ambulatory Visit | Attending: Obstetrics and Gynecology

## 2017-07-31 ENCOUNTER — Encounter: Payer: Self-pay | Admitting: *Deleted

## 2017-07-31 DIAGNOSIS — Z90710 Acquired absence of both cervix and uterus: Secondary | ICD-10-CM | POA: Diagnosis present

## 2017-07-31 DIAGNOSIS — N809 Endometriosis, unspecified: Principal | ICD-10-CM | POA: Insufficient documentation

## 2017-07-31 DIAGNOSIS — D251 Intramural leiomyoma of uterus: Secondary | ICD-10-CM | POA: Diagnosis not present

## 2017-07-31 DIAGNOSIS — R102 Pelvic and perineal pain: Secondary | ICD-10-CM | POA: Diagnosis present

## 2017-07-31 DIAGNOSIS — G8929 Other chronic pain: Secondary | ICD-10-CM | POA: Diagnosis not present

## 2017-07-31 DIAGNOSIS — N9412 Deep dyspareunia: Secondary | ICD-10-CM | POA: Diagnosis not present

## 2017-07-31 DIAGNOSIS — N946 Dysmenorrhea, unspecified: Secondary | ICD-10-CM | POA: Insufficient documentation

## 2017-07-31 DIAGNOSIS — Z9079 Acquired absence of other genital organ(s): Secondary | ICD-10-CM | POA: Diagnosis not present

## 2017-07-31 DIAGNOSIS — Z947 Corneal transplant status: Secondary | ICD-10-CM | POA: Insufficient documentation

## 2017-07-31 HISTORY — PX: VAGINAL HYSTERECTOMY: SHX2639

## 2017-07-31 LAB — POCT PREGNANCY, URINE: PREG TEST UR: NEGATIVE

## 2017-07-31 LAB — ABO/RH: ABO/RH(D): A POS

## 2017-07-31 SURGERY — HYSTERECTOMY, VAGINAL
Anesthesia: General | Wound class: Clean Contaminated

## 2017-07-31 MED ORDER — KETOROLAC TROMETHAMINE 30 MG/ML IJ SOLN
30.0000 mg | Freq: Four times a day (QID) | INTRAMUSCULAR | Status: DC
  Administered 2017-07-31: 30 mg via INTRAVENOUS
  Filled 2017-07-31: qty 1

## 2017-07-31 MED ORDER — FAMOTIDINE 20 MG PO TABS
20.0000 mg | ORAL_TABLET | Freq: Once | ORAL | Status: AC
  Administered 2017-07-31: 20 mg via ORAL

## 2017-07-31 MED ORDER — IBUPROFEN 600 MG PO TABS: 600.0000 mg | ORAL_TABLET | Freq: Four times a day (QID) | ORAL | Status: DC | PRN

## 2017-07-31 MED ORDER — DOCUSATE SODIUM 100 MG PO CAPS
100.0000 mg | ORAL_CAPSULE | Freq: Two times a day (BID) | ORAL | Status: DC
  Administered 2017-07-31: 100 mg via ORAL
  Filled 2017-07-31: qty 1

## 2017-07-31 MED ORDER — MORPHINE SULFATE (PF) 2 MG/ML IV SOLN: 2.0000 mg | INTRAVENOUS | Status: DC | PRN

## 2017-07-31 MED ORDER — OXYCODONE-ACETAMINOPHEN 5-325 MG PO TABS: 1.0000 | ORAL_TABLET | ORAL | Status: DC | PRN

## 2017-07-31 MED ORDER — PROMETHAZINE HCL 25 MG/ML IJ SOLN
6.2500 mg | INTRAMUSCULAR | Status: DC | PRN
  Administered 2017-07-31: 12.5 mg via INTRAVENOUS

## 2017-07-31 MED ORDER — DOCUSATE SODIUM 100 MG PO CAPS: 100.0000 mg | ORAL_CAPSULE | Freq: Two times a day (BID) | ORAL | Status: DC | PRN

## 2017-07-31 MED ORDER — MOMETASONE FURO-FORMOTEROL FUM 200-5 MCG/ACT IN AERO
2.0000 | INHALATION_SPRAY | Freq: Two times a day (BID) | RESPIRATORY_TRACT | Status: DC
  Administered 2017-07-31 – 2017-08-01 (×2): 2 via RESPIRATORY_TRACT
  Filled 2017-07-31: qty 8.8

## 2017-07-31 MED ORDER — MIDAZOLAM HCL 2 MG/2ML IJ SOLN
INTRAMUSCULAR | Status: DC | PRN
  Administered 2017-07-31: 2 mg via INTRAVENOUS

## 2017-07-31 MED ORDER — KETOROLAC TROMETHAMINE 30 MG/ML IJ SOLN: 30.0000 mg | Freq: Four times a day (QID) | INTRAMUSCULAR | Status: DC

## 2017-07-31 MED ORDER — KETOROLAC TROMETHAMINE 30 MG/ML IJ SOLN
30.0000 mg | Freq: Four times a day (QID) | INTRAMUSCULAR | Status: DC
  Administered 2017-07-31 – 2017-08-01 (×2): 30 mg via INTRAVENOUS
  Filled 2017-07-31 (×3): qty 1

## 2017-07-31 MED ORDER — PROPOFOL 10 MG/ML IV BOLUS
INTRAVENOUS | Status: DC | PRN
  Administered 2017-07-31: 150 mg via INTRAVENOUS

## 2017-07-31 MED ORDER — PROMETHAZINE HCL 25 MG/ML IJ SOLN
INTRAMUSCULAR | Status: AC
  Filled 2017-07-31: qty 1

## 2017-07-31 MED ORDER — ACETAMINOPHEN 10 MG/ML IV SOLN
INTRAVENOUS | Status: DC | PRN
  Administered 2017-07-31: 1000 mg via INTRAVENOUS

## 2017-07-31 MED ORDER — MIDAZOLAM HCL 2 MG/2ML IJ SOLN
INTRAMUSCULAR | Status: AC
  Filled 2017-07-31: qty 2

## 2017-07-31 MED ORDER — LIDOCAINE HCL (CARDIAC) 20 MG/ML IV SOLN
INTRAVENOUS | Status: DC | PRN
  Administered 2017-07-31: 60 mg via INTRAVENOUS

## 2017-07-31 MED ORDER — SIMETHICONE 80 MG PO CHEW: 80.0000 mg | CHEWABLE_TABLET | Freq: Four times a day (QID) | ORAL | Status: DC | PRN

## 2017-07-31 MED ORDER — LACTATED RINGERS IV SOLN
INTRAVENOUS | Status: DC
  Administered 2017-07-31 (×2): via INTRAVENOUS

## 2017-07-31 MED ORDER — ONDANSETRON HCL 4 MG/2ML IJ SOLN
INTRAMUSCULAR | Status: DC | PRN
  Administered 2017-07-31: 4 mg via INTRAVENOUS

## 2017-07-31 MED ORDER — MORPHINE SULFATE (PF) 2 MG/ML IV SOLN: 1.0000 mg | INTRAVENOUS | Status: DC | PRN

## 2017-07-31 MED ORDER — FAMOTIDINE 20 MG PO TABS
ORAL_TABLET | ORAL | Status: AC
  Filled 2017-07-31: qty 1

## 2017-07-31 MED ORDER — NON FORMULARY
1.0000 [drp] | Freq: Two times a day (BID) | Status: DC
  Administered 2017-07-31: 1 [drp] via INTRAOCULAR

## 2017-07-31 MED ORDER — VALACYCLOVIR HCL 500 MG PO TABS
500.0000 mg | ORAL_TABLET | Freq: Two times a day (BID) | ORAL | Status: DC
  Administered 2017-07-31: 500 mg via ORAL
  Filled 2017-07-31: qty 1

## 2017-07-31 MED ORDER — SUGAMMADEX SODIUM 500 MG/5ML IV SOLN
INTRAVENOUS | Status: DC | PRN
  Administered 2017-07-31: 112.4 mg via INTRAVENOUS

## 2017-07-31 MED ORDER — NON FORMULARY: 1.0000 [drp] | Freq: Every morning | Status: DC

## 2017-07-31 MED ORDER — ONDANSETRON HCL 4 MG/2ML IJ SOLN
INTRAMUSCULAR | Status: AC
  Filled 2017-07-31: qty 2

## 2017-07-31 MED ORDER — ACETAMINOPHEN 325 MG PO TABS: 650.0000 mg | ORAL_TABLET | ORAL | Status: DC | PRN

## 2017-07-31 MED ORDER — FENTANYL CITRATE (PF) 100 MCG/2ML IJ SOLN
25.0000 ug | INTRAMUSCULAR | Status: DC | PRN
  Administered 2017-07-31 (×2): 50 ug via INTRAVENOUS

## 2017-07-31 MED ORDER — FENTANYL CITRATE (PF) 100 MCG/2ML IJ SOLN
INTRAMUSCULAR | Status: AC
  Filled 2017-07-31: qty 2

## 2017-07-31 MED ORDER — OXYCODONE-ACETAMINOPHEN 5-325 MG PO TABS
1.0000 | ORAL_TABLET | ORAL | Status: DC | PRN
  Administered 2017-07-31 – 2017-08-01 (×2): 1 via ORAL
  Filled 2017-07-31: qty 2
  Filled 2017-07-31: qty 1

## 2017-07-31 MED ORDER — SUCCINYLCHOLINE CHLORIDE 20 MG/ML IJ SOLN
INTRAMUSCULAR | Status: AC
  Filled 2017-07-31: qty 1

## 2017-07-31 MED ORDER — ROCURONIUM BROMIDE 50 MG/5ML IV SOLN
INTRAVENOUS | Status: AC
  Filled 2017-07-31: qty 1

## 2017-07-31 MED ORDER — PROPOFOL 10 MG/ML IV BOLUS
INTRAVENOUS | Status: AC
  Filled 2017-07-31: qty 20

## 2017-07-31 MED ORDER — ESTROGENS, CONJUGATED 0.625 MG/GM VA CREA
TOPICAL_CREAM | VAGINAL | Status: AC
  Filled 2017-07-31: qty 30

## 2017-07-31 MED ORDER — FENTANYL CITRATE (PF) 100 MCG/2ML IJ SOLN
INTRAMUSCULAR | Status: DC | PRN
  Administered 2017-07-31: 100 ug via INTRAVENOUS

## 2017-07-31 MED ORDER — SEVOFLURANE IN SOLN
RESPIRATORY_TRACT | Status: AC
  Filled 2017-07-31: qty 250

## 2017-07-31 MED ORDER — BISACODYL 10 MG RE SUPP: 10.0000 mg | Freq: Every day | RECTAL | Status: DC | PRN

## 2017-07-31 MED ORDER — ROCURONIUM BROMIDE 100 MG/10ML IV SOLN
INTRAVENOUS | Status: DC | PRN
  Administered 2017-07-31: 40 mg via INTRAVENOUS

## 2017-07-31 MED ORDER — DEXAMETHASONE SODIUM PHOSPHATE 10 MG/ML IJ SOLN
INTRAMUSCULAR | Status: DC | PRN
  Administered 2017-07-31: 10 mg via INTRAVENOUS

## 2017-07-31 MED ORDER — LACTATED RINGERS IV SOLN
INTRAVENOUS | Status: DC
  Administered 2017-07-31 (×2): via INTRAVENOUS

## 2017-07-31 SURGICAL SUPPLY — 29 items
BAG URO DRAIN 2000ML W/SPOUT (MISCELLANEOUS) ×2 IMPLANT
CANISTER SUCT 1200ML W/VALVE (MISCELLANEOUS) ×2 IMPLANT
CATH FOLEY 2WAY  5CC 16FR (CATHETERS) ×1
CATH URTH 16FR FL 2W BLN LF (CATHETERS) ×1 IMPLANT
DRAPE PERI LITHO V/GYN (MISCELLANEOUS) ×2 IMPLANT
DRAPE SHEET LG 3/4 BI-LAMINATE (DRAPES) ×2 IMPLANT
DRAPE UNDER BUTTOCK W/FLU (DRAPES) ×2 IMPLANT
ELECT REM PT RETURN 9FT ADLT (ELECTROSURGICAL) ×2
ELECTRODE REM PT RTRN 9FT ADLT (ELECTROSURGICAL) ×1 IMPLANT
GAUZE PACK 2X3YD (MISCELLANEOUS) ×2 IMPLANT
GLOVE BIO SURGEON STRL SZ8 (GLOVE) ×4 IMPLANT
GLOVE INDICATOR 8.0 STRL GRN (GLOVE) ×2 IMPLANT
GOWN STRL REUS W/ TWL LRG LVL3 (GOWN DISPOSABLE) ×2 IMPLANT
GOWN STRL REUS W/ TWL XL LVL3 (GOWN DISPOSABLE) ×1 IMPLANT
GOWN STRL REUS W/TWL LRG LVL3 (GOWN DISPOSABLE) ×2
GOWN STRL REUS W/TWL XL LVL3 (GOWN DISPOSABLE) ×1
KIT RM TURNOVER CYSTO AR (KITS) ×2 IMPLANT
LABEL OR SOLS (LABEL) ×2 IMPLANT
NS IRRIG 500ML POUR BTL (IV SOLUTION) ×2 IMPLANT
PACK BASIN MINOR ARMC (MISCELLANEOUS) ×2 IMPLANT
PAD OB MATERNITY 4.3X12.25 (PERSONAL CARE ITEMS) ×2 IMPLANT
PAD PREP 24X41 OB/GYN DISP (PERSONAL CARE ITEMS) ×2 IMPLANT
SUT CHROMIC 0 CT 1 (SUTURE) ×4 IMPLANT
SUT CHROMIC 1-0 (SUTURE) ×2 IMPLANT
SUT CHROMIC 2 0 CT 1 (SUTURE) ×8 IMPLANT
SUT VIC AB 0 CT1 27 (SUTURE) ×3
SUT VIC AB 0 CT1 27XCR 8 STRN (SUTURE) ×3 IMPLANT
SUT VIC AB 0 CT1 36 (SUTURE) ×6 IMPLANT
SYRINGE 10CC LL (SYRINGE) ×2 IMPLANT

## 2017-07-31 NOTE — OR Nursing (Signed)
Lab tech in for abo/rh draw 

## 2017-07-31 NOTE — Interval H&P Note (Signed)
History and Physical Interval Note:  07/31/2017 8:52 AM  Jessica Carroll  has presented today for surgery, with the diagnosis of ENDOMETRIOSIS, DYSMENORRHEA, DYSPAREUNIA  The various methods of treatment have been discussed with the patient and family. After consideration of risks, benefits and other options for treatment, the patient has consented to  Procedure(s): HYSTERECTOMY VAGINAL (N/A) as a surgical intervention .  The patient's history has been reviewed, patient examined, no change in status, stable for surgery.  I have reviewed the patient's chart and labs.  Questions were answered to the patient's satisfaction.     Hassell Done A Rosalind Guido

## 2017-07-31 NOTE — Anesthesia Procedure Notes (Addendum)
Procedure Name: Intubation Date/Time: 07/31/2017 10:17 AM Performed by: Allean Found Pre-anesthesia Checklist: Patient identified, Emergency Drugs available, Suction available, Patient being monitored and Timeout performed Patient Re-evaluated:Patient Re-evaluated prior to induction Oxygen Delivery Method: Circle system utilized Preoxygenation: Pre-oxygenation with 100% oxygen Induction Type: IV induction Ventilation: Mask ventilation without difficulty Laryngoscope Size: Mac and 3 Grade View: Grade I Tube type: Oral Tube size: 7.0 mm Number of attempts: 1 Airway Equipment and Method: Stylet Placement Confirmation: ETT inserted through vocal cords under direct vision,  positive ETCO2 and breath sounds checked- equal and bilateral Secured at: 21 cm Tube secured with: Tape Dental Injury: Teeth and Oropharynx as per pre-operative assessment

## 2017-07-31 NOTE — Anesthesia Postprocedure Evaluation (Signed)
Anesthesia Post Note  Patient: Jessica Carroll  Procedure(s) Performed: Procedure(s) (LRB): HYSTERECTOMY VAGINAL (N/A)  Patient location during evaluation: PACU Anesthesia Type: General Level of consciousness: awake and alert Pain management: pain level controlled Vital Signs Assessment: post-procedure vital signs reviewed and stable Respiratory status: spontaneous breathing, nonlabored ventilation, respiratory function stable and patient connected to nasal cannula oxygen Cardiovascular status: blood pressure returned to baseline and stable Postop Assessment: no signs of nausea or vomiting Anesthetic complications: no     Last Vitals:  Vitals:   07/31/17 1340 07/31/17 1440  BP: (!) 95/50 (!) 104/56  Pulse: 62 65  Resp: 16 16  Temp: 36.7 C 36.6 C  SpO2: 100% 100%    Last Pain:  Vitals:   07/31/17 1440  TempSrc: Oral  PainSc:                  Martha Clan

## 2017-07-31 NOTE — Transfer of Care (Signed)
Immediate Anesthesia Transfer of Care Note  Patient: Jessica Carroll  Procedure(s) Performed: Procedure(s): HYSTERECTOMY VAGINAL (N/A)  Patient Location: PACU  Anesthesia Type:General  Level of Consciousness: sedated  Airway & Oxygen Therapy: Patient Spontanous Breathing and Patient connected to face mask oxygen  Post-op Assessment: Report given to RN and Post -op Vital signs reviewed and stable  Post vital signs: Reviewed and stable  Last Vitals:  Vitals:   07/31/17 0830 07/31/17 1125  BP: 126/64 (!) 106/54  Pulse: 73 61  Resp: 16 19  Temp: (!) 35.4 C 37.5 C  SpO2: 100% 100%    Last Pain:  Vitals:   07/31/17 0830  TempSrc: Tympanic  PainSc: 1          Complications: No apparent anesthesia complications

## 2017-07-31 NOTE — H&P (View-Only) (Signed)
Subjective:  PREOPERATIVE HISTORY AND PHYSICAL  Date of surgery: 07/31/2017 Surgical procedure: Transvaginal hysterectomy Diagnosis: Symptomatic endometriosis   Patient is a 46 y.o. [id number] scheduled for transvaginal hysterectomy on 07/31/2017. She has previously undergone bilateral salpingectomy for sterilization. Because the patient is experiencing persistent chronic pelvic pain, she desires definitive treatment at this time.  Patient also is experiencing off again on-again discomfort in the pelvis especially associated with intercourse over the past 6 months. She does have history of endometriosis which was diagnosed at the time of her tubal ligation. Bowel function is normal. She does not have pain with bowel movements Bladder function is normal. She denies UTI symptoms.  Gynecologic History Menarche-age 85. Cycles monthly. Duration of flow 4 days. History of dysmenorrhea, moderate, colon typically does not take medications; does report perimenstrual low backache without radiation into the buttocks or thighs. History of deep thrusting dyspareunia. No family history of ovarian cancer. No family history of endometriosis No history of abnormal Pap smears. No history of PID/STI Patient's last menstrual period was 12/09/2015. Contraception: bilateral salpingectomy Last Pap: Normal.   OB History    Gravida Para Term Preterm AB Living   0 0 0 0 0 0   SAB TAB Ectopic Multiple Live Births   0 0 0 0        Obstetric Comments   1st Menstrual Cycle:  12         Patient's last menstrual period was 07/11/2017 (exact date).    Past Medical History:  Diagnosis Date  . Anxiety   . Asthma 2011  . Diffuse cystic mastopathy   . Endometriosis   . GERD (gastroesophageal reflux disease)   . Headache     Past Surgical History:  Procedure Laterality Date  . CORNEAL TRANSPLANT Right 01/07/2013  . eyelid surgery Right June 19, 2015   [facility] by Dr. Norlene Duel  .  LAPAROSCOPIC BILATERAL SALPINGECTOMY N/A 01/25/2016   Procedure: LAPAROSCOPIC BILATERAL SALPINGECTOMY, ADHESIOLYSIS, LAPAROSCOPIC EXCISION/FULGERATION ENDOMETRIOSIS ;  Surgeon: [name], MD;  Location: [facility] ORS;  Service: Gynecology;  Laterality: N/A;  . Laparoscopic excision and fulguration of endometriosis N/A    bladder flap and uterosacral ligament endometriosis, excised and cauterized  . UPPER GI ENDOSCOPY  07/19/2012    OB History  Gravida Para Term Preterm AB Living  0 0 0 0 0 0  SAB TAB Ectopic Multiple Live Births  0 0 0 0        Obstetric Comments  1st Menstrual Cycle:  11       Social History   Social History  . Marital status: Single    Spouse name: N/A  . Number of children: N/A  . Years of education: 35   Occupational History  . textile    Social History Main Topics  . Smoking status: Never Smoker  . Smokeless tobacco: Never Used  . Alcohol use 0.0 oz/week     Comment: occ. wine  . Drug use: No  . Sexual activity: Yes    Partners: Male    Birth control/ protection: None, Surgical   Other Topics Concern  . None   Social History Narrative  . None    Family History  Problem Relation Age of Onset  . Adopted: Yes  . Hyperlipidemia Mother   . Hypertension Mother   . Diabetes Father   . Heart failure Father 19       hear attack  . Cancer Neg Hx   . Breast cancer Neg Hx      (  Not in a hospital admission)  Allergies  Allergen Reactions  . Penicillins Nausea Only    Review of Systems Constitutional: No recent fever/chills/sweats Respiratory: No recent cough/bronchitis Cardiovascular: No chest pain Gastrointestinal: No recent nausea/vomiting/diarrhea Genitourinary: No UTI symptoms Hematologic/lymphatic:No history of coagulopathy or recent blood thinner use    Objective:    BP (!) 89/52   Pulse 68   Ht 5\' 6"  (1.676 m)   Wt 125 lb 4.8 oz (56.8 kg)   LMP 07/11/2017 (Exact Date)   BMI 20.22 kg/m   General:   Normal  Skin:    normal  HEENT:  Normal  Neck:  Supple without Adenopathy or Thyromegaly  Lungs:   Heart:              Breasts:   Abdomen:  Pelvis:  M/S   Extremeties:  Neuro:    clear to auscultation bilaterally   Normal without murmur   Not Examined   soft, non-tender; bowel sounds normal; no masses,  no organomegaly   Exam deferred to OR  No CVAT  Warm/Dry   Normal          Assessment:     symptomatic endometriosis   Plan:  TVH   Counseling: Procedure, risks, reasons, benefits and complications (including injury to bowel, bladder, major blood vessel, ureter, bleeding, possibility of transfusion, infection, or fistula formation) reviewed in detail. Consent signed. Preop testing ordered. Instructions reviewed, including NPO after midnight.  [name], MD  Note: This dictation was prepared with Dragon dictation along with smaller phrase technology. Any transcriptional errors that result from this process are unintentional.

## 2017-07-31 NOTE — Anesthesia Post-op Follow-up Note (Signed)
Anesthesia QCDR form completed.        

## 2017-07-31 NOTE — Op Note (Signed)
OPERATIVE NOTE:  Jessica Carroll PROCEDURE DATE: 07/31/2017   PREOPERATIVE DIAGNOSIS: Symptomatic endometriosis  POSTOPERATIVE DIAGNOSIS: Symptomatic endometriosis  PROCEDURE: Transvaginal hysterectomy   SURGEON:  [name], MD  ASSISTANTS: Jeannie Fend, M.D. ANESTHESIA: General INDICATIONS: 46 y.o. G0P0000 with symptomatic endometriosis, status post bilateral salpingectomy previously, presents for definitive surgery through transvaginal hysterectomy  FINDINGS:  Grossly normal-appearing uterus; fallopian tubes surgically absent; bilateral ovaries are normal   I/O's: Total I/O In: -  Out: 250 [Urine:200; Blood:50] COUNTS:  YES SPECIMENS: Uterus with cervix ANTIBIOTIC PROPHYLAXIS:Ancef 2 grams COMPLICATIONS: None immediate  PROCEDURE IN DETAIL: Patient was brought to the operating room in the supine position. General endotracheal anesthesia was induced without difficulty. She was placed in the dorsal lithotomy position using the candycane stirrups. A Betadine perineal intravaginal prep and drape was performed in standard fashion. Timeout was completed. Weighted speculum was placed in the vagina. Double-tooth tenaculum was placed onto the cervix to facilitate uterine manipulation. Posterior colpotomy was made with Mayo scissors. Uterosacral ligaments were clamped cut and stick tied using 0 Vicryl suture. These pedicles were tagged. The cervix was circumscribed with Bovie cautery and the anterior vagina was dissected off lower uterine segment through sharp and blunt dissection. Eventually the anterior cul-de-sac was entered. The cardinal broad ligament complexes were sequentially clamped cut and stick tied using 0 Vicryl suture. This was performed up to the uterine cornua where the last remaining pedicles were doubly ligated. The first tie was a free tie, followed by a stick tie. These pedicles were tagged. The specimen was removed from the operative field. The posterior cuff of  the vagina was run with a baseball stitch, running locking, of 0 Vicryl suture. The peritoneum was then reapproximated and closed with a pursestring stitch of 0 Vicryl suture. The vagina was then closed with simple interrupted sutures of 2-0 chromic. Following completion of the procedure all instrumentation was removed from the vagina. Patient was then awakened extubated and taken to recovery room in satisfactory condition.  Justice Milliron A. Zipporah Plants, MD, ACOG [facility]

## 2017-07-31 NOTE — Anesthesia Preprocedure Evaluation (Signed)
Anesthesia Evaluation  Patient identified by MRN, date of birth, ID band Patient awake    Reviewed: Allergy & Precautions, H&P , NPO status , Patient's Chart, lab work & pertinent test results, reviewed documented beta blocker date and time   History of Anesthesia Complications (+) PONV and history of anesthetic complications  Airway Mallampati: II  TM Distance: >3 FB Neck ROM: full    Dental  (+) Teeth Intact   Pulmonary neg shortness of breath, asthma , neg sleep apnea, neg recent URI,           Cardiovascular negative cardio ROS       Neuro/Psych  Headaches, neg Seizures PSYCHIATRIC DISORDERS (Depression)    GI/Hepatic Neg liver ROS, GERD  ,  Endo/Other  negative endocrine ROS  Renal/GU negative Renal ROS  negative genitourinary   Musculoskeletal   Abdominal   Peds  Hematology negative hematology ROS (+)   Anesthesia Other Findings Past Medical History:   Diffuse cystic mastopathy                                    Asthma                                          2011         GERD (gastroesophageal reflux disease)                       Anxiety                                                      Headache                                                   Past Surgical History:   UPPER GI ENDOSCOPY                               07/19/2012   CORNEAL TRANSPLANT                              Right 01/07/2013    eyelid surgery                                  Right July 8, 2*     Comment:[place] by Dr. Norlene Duel BMI    Body Mass Index   20.63 kg/m 2     Reproductive/Obstetrics negative OB ROS                             Anesthesia Physical  Anesthesia Plan  ASA: II  Anesthesia Plan: General   Post-op Pain Management:    Induction: Intravenous  PONV Risk Score and Plan: 4 or greater and Ondansetron, Dexamethasone and Midazolam  Airway Management Planned:  Oral  ETT  Additional Equipment:   Intra-op Plan:   Post-operative Plan: Extubation in OR  Informed Consent: I have reviewed the patients History and Physical, chart, labs and discussed the procedure including the risks, benefits and alternatives for the proposed anesthesia with the patient or authorized representative who has indicated his/her understanding and acceptance.   Dental Advisory Given  Plan Discussed with: CRNA  Anesthesia Plan Comments:         Anesthesia Quick Evaluation

## 2017-08-01 DIAGNOSIS — N809 Endometriosis, unspecified: Secondary | ICD-10-CM | POA: Diagnosis not present

## 2017-08-01 LAB — HEMOGLOBIN: HEMOGLOBIN: 10.2 g/dL — AB (ref 12.0–16.0)

## 2017-08-01 MED ORDER — IBUPROFEN 800 MG PO TABS: 800.0000 mg | ORAL_TABLET | Freq: Three times a day (TID) | ORAL | 1 refills | Status: DC

## 2017-08-01 MED ORDER — DOCUSATE SODIUM 100 MG PO CAPS: 100.0000 mg | ORAL_CAPSULE | Freq: Two times a day (BID) | ORAL | 0 refills | Status: DC

## 2017-08-01 MED ORDER — HYDROMORPHONE HCL 2 MG PO TABS: 2.0000 mg | ORAL_TABLET | ORAL | 0 refills | Status: DC | PRN

## 2017-08-01 NOTE — Progress Notes (Signed)
D/C instructions provided, pt states understanding, aware of follow up appt.  Prescription given to pt.   

## 2017-08-01 NOTE — Discharge Summary (Signed)
Physician Discharge Summary  Patient ID: Jessica Carroll MRN: [id number] DOB/AGE: [birth year] 46 y.o.  Admit date: 07/31/2017 Discharge date: 08/01/2017  Admission Diagnoses: Chronic Pelvic Pain and Endometriosis  Discharge Diagnoses:  Chronic Pelvic Pain and Endometriosis  Operative Procedures: Procedure(s): HYSTERECTOMY VAGINAL (N/A)  Hospital Course: Uncomplicated.   Significant Diagnostic Studies:  Lab Results  Component Value Date   HGB 10.2 (L) 08/01/2017   HGB 11.5 (L) 07/27/2017   HGB 11.5 (L) 08/17/2016   Lab Results  Component Value Date   HCT 34.6 (L) 07/27/2017   HCT 36.2 08/17/2016   HCT 34.1 (L) 01/20/2016   CBC Latest Ref Rng & Units 08/01/2017 07/27/2017 08/17/2016  WBC 3.6 - 11.0 K/uL - 6.1 7.6  Hemoglobin 12.0 - 16.0 g/dL 10.2(L) 11.5(L) 11.5(L)  Hematocrit 35.0 - 47.0 % - 34.6(L) 36.2  Platelets 150 - 440 K/uL - 194 215     Discharged Condition: good  Discharge Exam: Blood pressure (!) 105/51, pulse 74, temperature (!) 97.4 F (36.3 C), temperature source Oral, resp. rate 18, height 5\' 6"  (1.676 m), weight 124 lb (56.2 kg), last menstrual period 07/11/2017, SpO2 100 %. Incision/Wound: no bleeding  Disposition: 01-Home or Self Care  Discharge Instructions    Discharge patient    Complete by:  As directed    Discharge disposition:  01-Home or Self Care   Discharge patient date:  08/01/2017     Allergies as of 08/01/2017      Reactions   Penicillins Nausea Only      Medication List    TAKE these medications   albuterol 108 (90 Base) MCG/ACT inhaler Commonly known as:  PROVENTIL HFA Inhale 2 puffs into the lungs every 6 (six) hours as needed for wheezing or shortness of breath.   ALPRAZolam 0.5 MG tablet Commonly known as:  XANAX Take 1 tablet (0.5 mg total) by mouth 2 (two) times daily as needed for anxiety.   baclofen 20 MG tablet Commonly known as:  LIORESAL Take 0.5-1 tablets (10-20 mg total) by mouth 3 (three) times daily as needed  for muscle spasms. When off work take more than qhs   DEXILANT 60 MG capsule Generic drug:  dexlansoprazole Take 1 capsule (60 mg total) by mouth daily.   docusate sodium 100 MG capsule Commonly known as:  COLACE Take 1 capsule (100 mg total) by mouth 2 (two) times daily.   Fluticasone-Salmeterol 250-50 MCG/DOSE Aepb Commonly known as:  ADVAIR Inhale 2 puffs into the lungs 2 (two) times daily.   HYDROmorphone 2 MG tablet Commonly known as:  DILAUDID Take 1 tablet (2 mg total) by mouth every 4 (four) hours as needed for moderate pain or severe pain.   ibuprofen 800 MG tablet Commonly known as:  ADVIL,MOTRIN TAKE 1 TABLET(800 MG) BY MOUTH THREE TIMES DAILY AS NEEDED What changed:  Another medication with the same name was added. Make sure you understand how and when to take each.   ibuprofen 800 MG tablet Commonly known as:  ADVIL,MOTRIN Take 1 tablet (800 mg total) by mouth 3 (three) times daily. What changed:  You were already taking a medication with the same name, and this prescription was added. Make sure you understand how and when to take each.   linaclotide 72 MCG capsule Commonly known as:  LINZESS TAKE 1 CAPSULE(72 MCG) BY MOUTH DAILY BEFORE BREAKFAST   MULTIPLE VITAMIN PO Take 1 tablet by mouth daily.   prednisoLONE acetate 1 % ophthalmic suspension Commonly known as:  PRED FORTE  Place 1 drop into the right eye daily.   promethazine 12.5 MG tablet Commonly known as:  PHENERGAN Take 1 tablet (12.5 mg total) by mouth every 8 (eight) hours as needed for nausea or vomiting.   ranitidine 300 MG capsule Commonly known as:  ZANTAC Take 1 capsule (300 mg total) by mouth every evening.   rizatriptan 10 MG tablet Commonly known as:  MAXALT Take 1 tablet (10 mg total) by mouth 2 (two) times daily as needed.   Travoprost (BAK Free) 0.004 % Soln ophthalmic solution Commonly known as:  TRAVATAN Place 1 drop into the right eye at bedtime.   triamcinolone cream 0.1  % Commonly known as:  KENALOG Apply 1 application topically 2 (two) times daily as needed.   valACYclovir 500 MG tablet Commonly known as:  VALTREX Take 1 tablet by mouth 2 (two) times daily.      Follow-up Information    Nelva Hauk, Alanda Slim, MD. Go in 1 week(s).   Specialties:  Obstetrics and Gynecology, Radiology Why:  Post Op Check Contact information: [address] [place] [address] [phone]           Signed: Alanda Slim Tavarion Babington 08/01/2017, 8:10 AM

## 2017-08-01 NOTE — Progress Notes (Signed)
D/C home to car via auxiliary in wheelchair.  

## 2017-08-02 LAB — SURGICAL PATHOLOGY

## 2017-08-03 ENCOUNTER — Telehealth: Payer: Self-pay | Admitting: Obstetrics and Gynecology

## 2017-08-03 NOTE — Telephone Encounter (Signed)
Pt is 3 day s/p vag hyst. C/O of constipation. Small bm this am. H/o of constipation. Taking stool softener bid. Used dilaudid x1. Taking ibup 800 q8. Pain under control. May had tylenol 1000 q6. Advised to push fluids. Prune juice/apple juice. Fresh fruits and veggies. Pt c/o sinus h/a. May take tylenol sinus as needed. Only lite vag bleeding.  Pt states mad advised to continue with linzess. F/u at post-op appt.

## 2017-08-03 NOTE — Telephone Encounter (Signed)
Patient with c/o constipation even with taking the stool softener she was given.  Please call

## 2017-08-08 ENCOUNTER — Ambulatory Visit (INDEPENDENT_AMBULATORY_CARE_PROVIDER_SITE_OTHER): Payer: 59 | Admitting: Obstetrics and Gynecology

## 2017-08-08 ENCOUNTER — Encounter: Payer: Self-pay | Admitting: Obstetrics and Gynecology

## 2017-08-08 VITALS — BP 102/61 | HR 82 | Ht 66.0 in | Wt 119.3 lb

## 2017-08-08 DIAGNOSIS — Z90710 Acquired absence of both cervix and uterus: Secondary | ICD-10-CM

## 2017-08-08 DIAGNOSIS — Z09 Encounter for follow-up examination after completed treatment for conditions other than malignant neoplasm: Secondary | ICD-10-CM

## 2017-08-08 DIAGNOSIS — R110 Nausea: Secondary | ICD-10-CM

## 2017-08-08 NOTE — Patient Instructions (Signed)
1. Return in 4 weeks for final postop check 2. Continue with routine postoperative precautions

## 2017-08-08 NOTE — Progress Notes (Signed)
Chief complaint: 1. Two-week postop check 2. Status post TVH  Jessica Carroll presents for her two-week postoperative check following TVH for symptomatic endometriosis. Bowel and bladder function are normal. She is experiencing mild vaginal discharge with spotting without pelvic pain. She is not using any pain medication.  Jessica Carroll does report some mild right upper quadrant discomfort with nausea. No vomiting. Previously she was being worked up for possible gallbladder disease but the workup was discontinued prior to imaging studies.  Pathology:DIAGNOSIS:  A. UTERUS WITH CERVIX; HYSTERECTOMY:  - CERVIX WITHOUT PATHOLOGIC CHANGES.  - SECRETORY ENDOMETRIUM.  - INTRAMURAL LEIOMYOMAS (78 GRAM UTERUS).      OBJECTIVE: BP 102/61   Pulse 82   Ht 5\' 6"  (1.676 m)   Wt 119 lb 4.8 oz (54.1 kg)   LMP 07/11/2017 (Exact Date)   BMI 19.26 kg/m   Physical exam-deferred  ASSESSMENT: 1. Normal two-week postop check status post TVH 2. Postoperative nausea, unclear etiology  PLAN: 1. Monitor nausea symptoms over the next month 2. Return in 4 weeks for final postop check 3. Consider workup for gallbladder disease if patient's symptoms persist at her next visit  [name], MD  Note: This dictation was prepared with Dragon dictation along with smaller phrase technology. Any transcriptional errors that result from this process are unintentional.

## 2017-08-16 ENCOUNTER — Telehealth: Payer: Self-pay | Admitting: Gastroenterology

## 2017-08-16 NOTE — Telephone Encounter (Signed)
Patient would like for you to call her to see if you can set up some tests for her.

## 2017-08-18 ENCOUNTER — Other Ambulatory Visit: Payer: Self-pay

## 2017-08-18 DIAGNOSIS — R112 Nausea with vomiting, unspecified: Secondary | ICD-10-CM

## 2017-08-18 DIAGNOSIS — R1011 Right upper quadrant pain: Secondary | ICD-10-CM

## 2017-08-18 NOTE — Telephone Encounter (Signed)
Scheduled RUQ [place] & HIDA for 9/21 @ 945am [facility]. NPO midnight  Called and advised the patient.

## 2017-08-22 ENCOUNTER — Encounter: Payer: Self-pay | Admitting: Family Medicine

## 2017-08-22 ENCOUNTER — Ambulatory Visit (INDEPENDENT_AMBULATORY_CARE_PROVIDER_SITE_OTHER): Payer: 59 | Admitting: Family Medicine

## 2017-08-22 VITALS — BP 110/70 | HR 76 | Temp 98.0°F | Resp 16 | Ht 64.17 in | Wt 121.8 lb

## 2017-08-22 DIAGNOSIS — Z0000 Encounter for general adult medical examination without abnormal findings: Secondary | ICD-10-CM

## 2017-08-22 DIAGNOSIS — Z6710 Type A blood, Rh positive: Secondary | ICD-10-CM | POA: Diagnosis not present

## 2017-08-22 DIAGNOSIS — Z13220 Encounter for screening for lipoid disorders: Secondary | ICD-10-CM

## 2017-08-22 DIAGNOSIS — R110 Nausea: Secondary | ICD-10-CM

## 2017-08-22 DIAGNOSIS — D649 Anemia, unspecified: Secondary | ICD-10-CM | POA: Diagnosis not present

## 2017-08-22 DIAGNOSIS — E538 Deficiency of other specified B group vitamins: Secondary | ICD-10-CM

## 2017-08-22 DIAGNOSIS — Z1239 Encounter for other screening for malignant neoplasm of breast: Secondary | ICD-10-CM

## 2017-08-22 DIAGNOSIS — Z01419 Encounter for gynecological examination (general) (routine) without abnormal findings: Secondary | ICD-10-CM

## 2017-08-22 DIAGNOSIS — R1010 Upper abdominal pain, unspecified: Secondary | ICD-10-CM | POA: Diagnosis not present

## 2017-08-22 DIAGNOSIS — Z131 Encounter for screening for diabetes mellitus: Secondary | ICD-10-CM

## 2017-08-22 LAB — CBC WITH DIFFERENTIAL/PLATELET
BASOS ABS: 78 {cells}/uL (ref 0–200)
Basophils Relative: 1.1 %
EOS ABS: 128 {cells}/uL (ref 15–500)
Eosinophils Relative: 1.8 %
HEMATOCRIT: 33.7 % — AB (ref 35.0–45.0)
HEMOGLOBIN: 11.1 g/dL — AB (ref 11.7–15.5)
LYMPHS ABS: 1938 {cells}/uL (ref 850–3900)
MCH: 30.1 pg (ref 27.0–33.0)
MCHC: 32.9 g/dL (ref 32.0–36.0)
MCV: 91.3 fL (ref 80.0–100.0)
MONOS PCT: 7.2 %
MPV: 11.9 fL (ref 7.5–12.5)
NEUTROS ABS: 4445 {cells}/uL (ref 1500–7800)
Neutrophils Relative %: 62.6 %
Platelets: 222 10*3/uL (ref 140–400)
RBC: 3.69 10*6/uL — ABNORMAL LOW (ref 3.80–5.10)
RDW: 11.7 % (ref 11.0–15.0)
Total Lymphocyte: 27.3 %
WBC mixed population: 511 cells/uL (ref 200–950)
WBC: 7.1 10*3/uL (ref 3.8–10.8)

## 2017-08-22 LAB — IRON,TIBC AND FERRITIN PANEL
%SAT: 8 % (calc) — ABNORMAL LOW (ref 11–50)
Ferritin: 52 ng/mL (ref 10–232)
Iron: 27 ug/dL — ABNORMAL LOW (ref 40–190)
TIBC: 338 mcg/dL (calc) (ref 250–450)

## 2017-08-22 MED ORDER — PROMETHAZINE HCL 12.5 MG PO TABS: 12.5000 mg | ORAL_TABLET | Freq: Three times a day (TID) | ORAL | 0 refills | Status: DC | PRN

## 2017-08-22 NOTE — Progress Notes (Signed)
Name: Jessica Carroll   MRN: [id number]    DOB: 1971/04/01   Date:08/22/2017       Progress Note  Subjective  Chief Complaint  Chief Complaint  Patient presents with  . Annual Exam    HPI  Well Woman: sexually active with boyfriend of 86 years, guests are out of the house. She has been on short term disability since 07/31/2017 from recent hysterectomy. Breast has been sore since surgery, but no nipple discharge or masses, mammogram is up to date. Mild anemia during hospital stay, we will recheck levels. She has noticed that bp goes low at times and gets dizzy, discussed importance of getting up slowly and staying hydrated  Nausea /abdominal pain: going on for a long time, on Dexilant, but still has two episodes of nausea during per week and needs a refill of medication. No change in bowel movements or blood in stools, no vomiting. Sometimes has epigastric pain, described as sharp and aching and improves with heating pad. Seeing Dr. Vicente Males and will have gallbladder US and HIDA scan in 10 days.    Patient Active Problem List   Diagnosis Date Noted  . S/P vaginal hysterectomy 07/31/2017  . Recurrent vaginitis 01/10/2017  . Hemorrhoids, external without complications 78/29/5621  . Nausea in adult patient 02/24/2016  . Endometriosis determined by laparoscopy 01/25/2016  . Chronic constipation 08/14/2015  . Bilateral ganglion cysts of wrists 08/14/2015  . Mild mitral regurgitation 05/19/2015  . B12 deficiency 05/18/2015  . Asthma, moderate 05/18/2015  . Anxiety 05/18/2015  . Insomnia, persistent 05/18/2015  . Eczema of hand 05/18/2015  . Viral keratitis 05/18/2015  . Migraine without aura and without status migrainosus, not intractable 05/18/2015  . Numerous moles 05/18/2015  . Allergic rhinitis 05/18/2015  . History of corneal transplant 05/18/2015  . Gastroesophageal reflux disease without esophagitis 12/16/2009    Past Surgical History:  Procedure Laterality Date  . CORNEAL  TRANSPLANT Right 01/07/2013  . EYE SURGERY    . eyelid surgery Right June 19, 2015   [facility] by Dr. Norlene Duel  . LAPAROSCOPIC BILATERAL SALPINGECTOMY N/A 01/25/2016   Procedure: LAPAROSCOPIC BILATERAL SALPINGECTOMY, ADHESIOLYSIS, LAPAROSCOPIC EXCISION/FULGERATION ENDOMETRIOSIS ;  Surgeon: [name], MD;  Location: [facility] ORS;  Service: Gynecology;  Laterality: N/A;  . Laparoscopic excision and fulguration of endometriosis N/A    bladder flap and uterosacral ligament endometriosis, excised and cauterized  . UPPER GI ENDOSCOPY  07/19/2012  . VAGINAL HYSTERECTOMY N/A 07/31/2017   Procedure: HYSTERECTOMY VAGINAL;  Surgeon: [name], MD;  Location: [facility] ORS;  Service: Gynecology;  Laterality: N/A;    Family History  Problem Relation Age of Onset  . Adopted: Yes  . Hyperlipidemia Mother   . Hypertension Mother   . Diabetes Father   . Heart failure Father 33       hear attack  . Cancer Neg Hx   . Breast cancer Neg Hx     Social History   Social History  . Marital status: Single    Spouse name: N/A  . Number of children: N/A  . Years of education: 24   Occupational History  . textile    Social History Main Topics  . Smoking status: Never Smoker  . Smokeless tobacco: Never Used  . Alcohol use 0.0 oz/week     Comment: occ. wine  . Drug use: No  . Sexual activity: Yes    Partners: Male    Birth control/ protection: None, Surgical   Other Topics Concern  .  Not on file   Social History Narrative  . No narrative on file     Current Outpatient Prescriptions:  .  albuterol (PROVENTIL HFA) 108 (90 Base) MCG/ACT inhaler, Inhale 2 puffs into the lungs every 6 (six) hours as needed for wheezing or shortness of breath., Disp: 1 Inhaler, Rfl: 1 .  ALPRAZolam (XANAX) 0.5 MG tablet, Take 1 tablet (0.5 mg total) by mouth 2 (two) times daily as needed for anxiety., Disp: 25 tablet, Rfl: 0 .  baclofen (LIORESAL) 20 MG tablet, Take 0.5-1 tablets (10-20 mg total) by  mouth 3 (three) times daily as needed for muscle spasms. When off work take more than qhs, Disp: 30 each, Rfl: 0 .  DEXILANT 60 MG capsule, Take 1 capsule (60 mg total) by mouth daily., Disp: 30 capsule, Rfl: 2 .  Fluticasone-Salmeterol (ADVAIR) 250-50 MCG/DOSE AEPB, Inhale 2 puffs into the lungs 2 (two) times daily., Disp: 60 each, Rfl: 5 .  ibuprofen (ADVIL,MOTRIN) 800 MG tablet, , Disp: , Rfl: 1 .  linaclotide (LINZESS) 72 MCG capsule, TAKE 1 CAPSULE(72 MCG) BY MOUTH DAILY BEFORE BREAKFAST, Disp: 30 capsule, Rfl: 5 .  MULTIPLE VITAMIN PO, Take 1 tablet by mouth daily., Disp: , Rfl:  .  prednisoLONE acetate (PRED FORTE) 1 % ophthalmic suspension, Place 1 drop into the right eye daily. , Disp: , Rfl:  .  promethazine (PHENERGAN) 12.5 MG tablet, Take 1 tablet (12.5 mg total) by mouth every 8 (eight) hours as needed for nausea or vomiting., Disp: 20 tablet, Rfl: 0 .  rizatriptan (MAXALT) 10 MG tablet, Take 1 tablet (10 mg total) by mouth 2 (two) times daily as needed., Disp: 10 tablet, Rfl: 2 .  STOOL SOFTENER 100 MG capsule, , Disp: , Rfl: 0 .  Travoprost, BAK Free, (TRAVATAN) 0.004 % SOLN ophthalmic solution, Place 1 drop into the right eye at bedtime., Disp: , Rfl:  .  triamcinolone cream (KENALOG) 0.1 %, Apply 1 application topically 2 (two) times daily as needed., Disp: 30 g, Rfl: 0 .  valACYclovir (VALTREX) 500 MG tablet, Take 1 tablet by mouth 2 (two) times daily., Disp: , Rfl: 0 .  ranitidine (ZANTAC) 300 MG capsule, Take 1 capsule (300 mg total) by mouth every evening., Disp: 30 capsule, Rfl: 0  Allergies  Allergen Reactions  . Penicillins Nausea Only     ROS  Constitutional: Negative for fever or weight change.  Respiratory: Negative for cough and shortness of breath.   Cardiovascular: Negative for chest pain or palpitations.  Gastrointestinal: Positive for intermittent abdominal pain, but no bowel changes.  Musculoskeletal: Negative for gait problem or joint swelling.  Skin:  Negative for rash.  Neurological: positive for intermittent dizziness but no  headache.  No other specific complaints in a complete review of systems (except as listed in HPI above).  Objective  Vitals:   08/22/17 0825  BP: 110/70  Pulse: 76  Resp: 16  Temp: 98 F (36.7 C)  TempSrc: Oral  SpO2: 98%  Weight: 121 lb 12.8 oz (55.2 kg)  Height: 5' 4.17" (1.63 m)    Body mass index is 20.79 kg/m.  Physical Exam  Constitutional: Patient appears well-developed and well-nourished. No distress.  HENT: Head: Normocephalic and atraumatic. Ears: B TMs ok, no erythema or effusion; Nose: Nose normal. Mouth/Throat: Oropharynx is clear and moist. No oropharyngeal exudate.  Eyes: Conjunctivae and EOM are normal. Pupils are equal, round, and reactive to light. No scleral icterus.  Neck: Normal range of motion. Neck supple. No  JVD present. No thyromegaly present.  Cardiovascular: Normal rate, regular rhythm and normal heart sounds.  No murmur heard. No BLE edema. Pulmonary/Chest: Effort normal and breath sounds normal. No respiratory distress. Abdominal: Soft. Bowel sounds are normal, no distension. There is no tenderness. no masses Breast:lumpy and sore, she just had hysterectomy, she will call back for another exam if tenderness at 3 o'clock position of right breast persists, no nipple discharge or rashes FEMALE GENITALIA:  Not done, hysterectomy done 3 weeks ago  RECTAL: not done Musculoskeletal: Normal range of motion, no joint effusions. No gross deformities Neurological: he is alert and oriented to person, place, and time. No cranial nerve deficit. Coordination, balance, strength, speech and gait are normal.  Skin: Skin is warm and dry. No rash noted. No erythema.  Psychiatric: Patient has a normal mood and affect. behavior is normal. Judgment and thought content normal.  Recent Results (from the past 2160 hour(s))  CBC WITH DIFFERENTIAL     Status: Abnormal   Collection Time: 07/27/17   1:20 PM  Result Value Ref Range   WBC 6.1 3.6 - 11.0 K/uL   RBC 3.72 (L) 3.80 - 5.20 MIL/uL   Hemoglobin 11.5 (L) 12.0 - 16.0 g/dL   HCT 34.6 (L) 35.0 - 47.0 %   MCV 93.0 80.0 - 100.0 fL   MCH 30.9 26.0 - 34.0 pg   MCHC 33.2 32.0 - 36.0 g/dL   RDW 12.5 11.5 - 14.5 %   Platelets 194 150 - 440 K/uL   Neutrophils Relative % 55 %   Neutro Abs 3.4 1.4 - 6.5 K/uL   Lymphocytes Relative 38 %   Lymphs Abs 2.3 1.0 - 3.6 K/uL   Monocytes Relative 5 %   Monocytes Absolute 0.3 0.2 - 0.9 K/uL   Eosinophils Relative 1 %   Eosinophils Absolute 0.1 0 - 0.7 K/uL   Basophils Relative 1 %   Basophils Absolute 0.1 0 - 0.1 K/uL  Rapid HIV screen (HIV 1/2 Ab+Ag)     Status: None   Collection Time: 07/27/17  1:20 PM  Result Value Ref Range   HIV-1 P24 Antigen - HIV24 NON REACTIVE NON REACTIVE   HIV 1/2 Antibodies NON REACTIVE NON REACTIVE   Interpretation (HIV Ag Ab)      A non reactive test result means that HIV 1 or HIV 2 antibodies and HIV 1 p24 antigen were not detected in the specimen.  RPR     Status: None   Collection Time: 07/27/17  1:20 PM  Result Value Ref Range   RPR Ser Ql Non Reactive Non Reactive    Comment: (NOTE) Performed At: [facility] [address] [place], [place] [id number] Lindon Romp MD PX:[id number]   Type and screen     Status: None   Collection Time: 07/27/17  1:20 PM  Result Value Ref Range   ABO/RH(D) A POS    Antibody Screen NEG    Sample Expiration 08/10/2017    Extend sample reason NO TRANSFUSIONS OR PREGNANCY IN THE PAST 3 MONTHS   Pregnancy, urine POC     Status: None   Collection Time: 07/31/17  8:48 AM  Result Value Ref Range   Preg Test, Ur NEGATIVE NEGATIVE    Comment:        THE SENSITIVITY OF THIS METHODOLOGY IS >24 mIU/mL   ABO/Rh     Status: None   Collection Time: 07/31/17  8:54 AM  Result Value Ref Range   ABO/RH(D) A POS  Surgical pathology     Status: None   Collection Time: 07/31/17 10:51 AM  Result Value Ref Range    SURGICAL PATHOLOGY      Surgical Pathology CASE: [id number] PATIENT: Nyoka Umeda Surgical Pathology Report     SPECIMEN SUBMITTED: A. Uterus and cervix  CLINICAL HISTORY: None provided  PRE-OPERATIVE DIAGNOSIS: Endometriosis, dysmenorrhea, dyspareunia  POST-OPERATIVE DIAGNOSIS: Same as pre-op     DIAGNOSIS: A. UTERUS WITH CERVIX; HYSTERECTOMY: - CERVIX WITHOUT PATHOLOGIC CHANGES. - SECRETORY ENDOMETRIUM. - INTRAMURAL LEIOMYOMAS (78 GRAM UTERUS).   GROSS DESCRIPTION: A. The specimen is received in a formalin-filled container labeled with the patient's name and uterus and cervix. Weight: 78 grams Dimensions:      Fundus - 4.1 x 4.2 x 3.4 cm      Cervix - 4.0 x 3.7 cm a 0.5 cm slit like os Serosa: wrinkled pink to tan Cervix: smooth pink-tan Endocervix: trabecular pink-tan Endometrial cavity:      Dimensions - 2.6 x 1.8 cm      Thickness - 0.2 cm      Other findings - none noted Myometrium:     Thickness - 2.0 cm     Other findings - 3 intramural white whorled  nodules ranging from 0.5 to 1.1 cm intramural white whorled nodules  Block summary: 1-representative anterior cervix 2-representative posterior cervix 3-representative anterior endomyometrium 4-representative posterior endomyometrium 5-representative intramural nodules  Final Diagnosis performed by Bryan Lemma, MD.  Electronically signed 08/02/2017 4:19:54PM    The electronic signature indicates that the named Attending Pathologist has evaluated the specimen  Technical component performed at [facility], [address], [place], [place] [address] Lab: [phone] Dir: Darrick Penna. Evette Doffing, MD  Professional component performed at [facility], [facility], [place], [place],  [address] Lab: [phone] Dir: Dellia Nims. Rubinas, MD    Hemoglobin     Status: Abnormal   Collection Time: 08/01/17  5:28 AM  Result Value Ref Range   Hemoglobin 10.2 (L) 12.0 - 16.0 g/dL       PHQ2/9: Depression screen [facility] 2/9 08/22/2017 01/24/2017 09/02/2016 06/20/2016 03/17/2016  Decreased Interest 0 0 0 0 0  Down, Depressed, Hopeless 0 0 0 0 0  PHQ - 2 Score 0 0 0 0 0  Altered sleeping - - - - -  Tired, decreased energy - - - - -  Change in appetite - - - - -  Feeling bad or failure about yourself  - - - - -  Trouble concentrating - - - - -  Moving slowly or fidgety/restless - - - - -  Suicidal thoughts - - - - -  PHQ-9 Score - - - - -  Difficult doing work/chores - - - - -     Fall Risk: Fall Risk  08/22/2017 01/24/2017 09/02/2016 06/20/2016 03/17/2016  Falls in the past year? No No No No No  Risk for fall due to : - - - - -      Functional Status Survey: Is the patient deaf or have difficulty hearing?: No Does the patient have difficulty seeing, even when wearing glasses/contacts?: No Does the patient have difficulty concentrating, remembering, or making decisions?: No Does the patient have difficulty walking or climbing stairs?: No Does the patient have difficulty dressing or bathing?: No Does the patient have difficulty doing errands alone such as visiting a doctor's office or shopping?: No    Assessment & Plan  1. Well woman exam  Discussed importance of 150 minutes of physical  activity weekly, eat two servings of fish weekly, eat one serving of tree nuts ( cashews, pistachios, pecans, almonds.Marland Kitchen) every other day, eat 6 servings of fruit/vegetables daily and drink plenty of water and avoid sweet beverages.  - Lipid panel - COMPLETE METABOLIC PANEL WITH GFR - Hemoglobin A1c - VITAMIN D 25 Hydroxy (Vit-D Deficiency, Fractures) - Vitamin B12  2. B12 deficiency  - Vitamin B12  3. Lipid screening  - Lipid panel  4. Breast cancer screening  Repeat March 2018   5. Screening for diabetes mellitus  - Hemoglobin A1c  6. Pain of upper abdomen  Seeing Dr. Vicente Males, has HIDA scan and gallbladder US scheduled for 09/21 and if negative she will have  endoscopy done  7. Nausea  - promethazine (PHENERGAN) 12.5 MG tablet; Take 1 tablet (12.5 mg total) by mouth every 8 (eight) hours as needed for nausea or vomiting.  Dispense: 20 tablet; Refill: 0  8. Anemia, unspecified type  - CBC with Differential/Platelet - Iron, TIBC and Ferritin Panel

## 2017-08-22 NOTE — Patient Instructions (Signed)
Preventive Care 40-64 Years, Female Preventive care refers to lifestyle choices and visits with your health care provider that can promote health and wellness. What does preventive care include?  A yearly physical exam. This is also called an annual well check.  Dental exams once or twice a year.  Routine eye exams. Ask your health care provider how often you should have your eyes checked.  Personal lifestyle choices, including: ? Daily care of your teeth and gums. ? Regular physical activity. ? Eating a healthy diet. ? Avoiding tobacco and drug use. ? Limiting alcohol use. ? Practicing safe sex. ? Taking low-dose aspirin daily starting at age 47. ? Taking vitamin and mineral supplements as recommended by your health care provider. What happens during an annual well check? The services and screenings done by your health care provider during your annual well check will depend on your age, overall health, lifestyle risk factors, and family history of disease. Counseling Your health care provider may ask you questions about your:  Alcohol use.  Tobacco use.  Drug use.  Emotional well-being.  Home and relationship well-being.  Sexual activity.  Eating habits.  Work and work Statistician.  Method of birth control.  Menstrual cycle.  Pregnancy history.  Screening You may have the following tests or measurements:  Height, weight, and BMI.  Blood pressure.  Lipid and cholesterol levels. These may be checked every 5 years, or more frequently if you are over 47 years old.  Skin check.  Lung cancer screening. You may have this screening every year starting at age 47 if you have a 30-pack-year history of smoking and currently smoke or have quit within the past 15 years.  Fecal occult blood test (FOBT) of the stool. You may have this test every year starting at age 47.  Flexible sigmoidoscopy or colonoscopy. You may have a sigmoidoscopy every 5 years or a colonoscopy  every 10 years starting at age 30.  Hepatitis C blood test.  Hepatitis B blood test.  Sexually transmitted disease (STD) testing.  Diabetes screening. This is done by checking your blood sugar (glucose) after you have not eaten for a while (fasting). You may have this done every 1-3 years.  Mammogram. This may be done every 1-2 years. Talk to your health care provider about when you should start having regular mammograms. This may depend on whether you have a family history of breast cancer.  BRCA-related cancer screening. This may be done if you have a family history of breast, ovarian, tubal, or peritoneal cancers.  Pelvic exam and Pap test. This may be done every 3 years starting at age 47. Starting at age 47, this may be done every 5 years if you have a Pap test in combination with an HPV test.  Bone density scan. This is done to screen for osteoporosis. You may have this scan if you are at high risk for osteoporosis.  Discuss your test results, treatment options, and if necessary, the need for more tests with your health care provider. Vaccines Your health care provider may recommend certain vaccines, such as:  Influenza vaccine. This is recommended every year.  Tetanus, diphtheria, and acellular pertussis (Tdap, Td) vaccine. You may need a Td booster every 10 years.  Varicella vaccine. You may need this if you have not been vaccinated.  Zoster vaccine. You may need this after age 47.  Measles, mumps, and rubella (MMR) vaccine. You may need at least one dose of MMR if you were born in  1958 or later. You may also need a second dose.  Pneumococcal 13-valent conjugate (PCV13) vaccine. You may need this if you have certain conditions and were not previously vaccinated.  Pneumococcal polysaccharide (PPSV23) vaccine. You may need one or two doses if you smoke cigarettes or if you have certain conditions.  Meningococcal vaccine. You may need this if you have certain  conditions.  Hepatitis A vaccine. You may need this if you have certain conditions or if you travel or work in places where you may be exposed to hepatitis A.  Hepatitis B vaccine. You may need this if you have certain conditions or if you travel or work in places where you may be exposed to hepatitis B.  Haemophilus influenzae type b (Hib) vaccine. You may need this if you have certain conditions.  Talk to your health care provider about which screenings and vaccines you need and how often you need them. This information is not intended to replace advice given to you by your health care provider. Make sure you discuss any questions you have with your health care provider. Document Released: 12/25/2015 Document Revised: 08/17/2016 Document Reviewed: 09/29/2015 Elsevier Interactive Patient Education  2017 [employer].

## 2017-08-23 ENCOUNTER — Telehealth: Payer: Self-pay | Admitting: Obstetrics and Gynecology

## 2017-08-23 LAB — VITAMIN B12: Vitamin B-12: 256 pg/mL (ref 200–1100)

## 2017-08-23 LAB — COMPLETE METABOLIC PANEL WITH GFR
AG Ratio: 1.6 (calc) (ref 1.0–2.5)
ALBUMIN MSPROF: 4.3 g/dL (ref 3.6–5.1)
ALT: 6 U/L (ref 6–29)
AST: 10 U/L (ref 10–35)
Alkaline phosphatase (APISO): 61 U/L (ref 33–115)
BILIRUBIN TOTAL: 0.6 mg/dL (ref 0.2–1.2)
BUN: 11 mg/dL (ref 7–25)
CHLORIDE: 103 mmol/L (ref 98–110)
CO2: 24 mmol/L (ref 20–32)
Calcium: 9.4 mg/dL (ref 8.6–10.2)
Creat: 0.65 mg/dL (ref 0.50–1.10)
GFR, EST AFRICAN AMERICAN: 123 mL/min/{1.73_m2} (ref >=60)
GFR, Est Non African American: 106 mL/min/{1.73_m2} (ref >=60)
GLOBULIN: 2.7 g/dL (ref 1.9–3.7)
GLUCOSE: 86 mg/dL (ref 65–99)
Potassium: 4.5 mmol/L (ref 3.5–5.3)
SODIUM: 136 mmol/L (ref 135–146)
TOTAL PROTEIN: 7 g/dL (ref 6.1–8.1)

## 2017-08-23 LAB — HEMOGLOBIN A1C
Hgb A1c MFr Bld: 5.2 % of total Hgb (ref <5.7)
Mean Plasma Glucose: 103 (calc)
eAG (mmol/L): 5.7 (calc)

## 2017-08-23 LAB — LIPID PANEL
CHOLESTEROL: 172 mg/dL (ref <200)
HDL: 53 mg/dL (ref >50)
LDL Cholesterol (Calc): 102 mg/dL (calc) — ABNORMAL HIGH
Non-HDL Cholesterol (Calc): 119 mg/dL (calc) (ref <130)
Total CHOL/HDL Ratio: 3.2 (calc) (ref <5.0)
Triglycerides: 76 mg/dL (ref <150)

## 2017-08-23 LAB — VITAMIN D 25 HYDROXY (VIT D DEFICIENCY, FRACTURES): Vit D, 25-Hydroxy: 35 ng/mL (ref 30–100)

## 2017-08-23 NOTE — Telephone Encounter (Signed)
Pt states she has had right sided pelvic pain since yesterday. Seen in ED with normal CT. Dx with constipation. Advised to add stool softener and continue with linzess. Today pt has had diarrhea. Took ibup about 20 minutes ago. Has h/o of ibs. NO fever, nausea or vomiting. Lite vaginal spotting. Advised pt to monitor sx. If she develops fever, severe pain not relieved with ibup or n/v she needs to contact office asap.

## 2017-08-23 NOTE — Telephone Encounter (Signed)
Surgery 3 weeks ago and she had to go to ED last night due to right sided pain - CT showed that she had stool collected and she needs to get something to help her. She went to [facility] ED. She did start back on her stool softener this am. She has been having a bowel movement everyday so she's not sure how that made her side hurt the way does.   Please call

## 2017-08-28 ENCOUNTER — Telehealth: Payer: Self-pay | Admitting: Obstetrics and Gynecology

## 2017-08-28 NOTE — Telephone Encounter (Signed)
Pt states her pain is much better than last week. NO ibup today and her pain is a 2. She would like to stop taking ibup d/t stomach issues. Recommended tylenol as directed prn. Bm are normal with linzess and stool softener daily.

## 2017-08-28 NOTE — Telephone Encounter (Signed)
Patient would like to speak with CM about the medication she is taking for her right sided pain   Please call

## 2017-09-01 ENCOUNTER — Ambulatory Visit
Admission: RE | Admit: 2017-09-01 | Discharge: 2017-09-01 | Disposition: A | Discharge status: Home / Self Care | Payer: Commercial Managed Care - HMO | Source: Ambulatory Visit | Attending: Gastroenterology | Admitting: Gastroenterology

## 2017-09-01 ENCOUNTER — Encounter
Admission: RE | Admit: 2017-09-01 | Discharge: 2017-09-01 | Disposition: A | Discharge status: Home / Self Care | Payer: Commercial Managed Care - HMO | Source: Ambulatory Visit | Attending: Gastroenterology | Admitting: Gastroenterology

## 2017-09-01 DIAGNOSIS — R1011 Right upper quadrant pain: Secondary | ICD-10-CM

## 2017-09-01 DIAGNOSIS — K7689 Other specified diseases of liver: Secondary | ICD-10-CM | POA: Insufficient documentation

## 2017-09-01 DIAGNOSIS — R112 Nausea with vomiting, unspecified: Secondary | ICD-10-CM | POA: Diagnosis present

## 2017-09-01 MED ORDER — TECHNETIUM TC 99M MEBROFENIN IV KIT
5.0500 | PACK | Freq: Once | INTRAVENOUS | Status: AC | PRN
  Administered 2017-09-01: 5.05 via INTRAVENOUS

## 2017-09-05 ENCOUNTER — Encounter: Payer: Self-pay | Admitting: Obstetrics and Gynecology

## 2017-09-05 ENCOUNTER — Ambulatory Visit (INDEPENDENT_AMBULATORY_CARE_PROVIDER_SITE_OTHER): Payer: 59 | Admitting: Obstetrics and Gynecology

## 2017-09-05 VITALS — BP 109/65 | HR 70 | Ht 64.0 in | Wt 122.3 lb

## 2017-09-05 DIAGNOSIS — R300 Dysuria: Secondary | ICD-10-CM

## 2017-09-05 DIAGNOSIS — N898 Other specified noninflammatory disorders of vagina: Secondary | ICD-10-CM

## 2017-09-05 DIAGNOSIS — Z09 Encounter for follow-up examination after completed treatment for conditions other than malignant neoplasm: Secondary | ICD-10-CM

## 2017-09-05 LAB — POCT URINALYSIS DIPSTICK
Bilirubin, UA: NEGATIVE
Blood, UA: NEGATIVE
GLUCOSE UA: NEGATIVE
KETONES UA: NEGATIVE
Leukocytes, UA: NEGATIVE
Nitrite, UA: NEGATIVE
Protein, UA: NEGATIVE
SPEC GRAV UA: 1.015 (ref 1.010–1.025)
UROBILINOGEN UA: 0.2 U/dL
pH, UA: 6 (ref 5.0–8.0)

## 2017-09-05 NOTE — Progress Notes (Signed)
Chief complaint: 1. Final postop check 2. Status post transvaginal hysterectomy for symptomatic endometriosis  Jessica Carroll presents today for her final postop check proximal to 6 weeks after surgery. Bowel and bladder function are normal. The patient is having increased vaginal discharge. She has not had intercourse surgery. The patient is to return to work next week.  Past medical history, past surgical history, problem list, medications, and allergies are reviewed  OBJECTIVE: BP 109/65   Pulse 70   Ht 5\' 4"  (1.626 m)   Wt 122 lb 4.8 oz (55.5 kg)   LMP 07/11/2017 (Exact Date)   BMI 20.99 kg/m  Pleasant female in no acute distress. Alert and oriented. Abdomen: Soft, nontender Pelvic exam: External genitalia-normal BUS-normal Vagina-minimal bloody secretions the vaginal vault; vagina has good estrogen effect; the vaginal cuff is notable for moderate granulation tissue which is friable. Bimanual-mild vaginal cuff tenderness; vaginal cuff intact; no pelvic masses Rectovaginal-normal external exam  ASSESSMENT: 1. Status post TVH, 6 weeks ago 2. Vaginal granulation tissue  PLAN: 1. Monsel solution is applied to vaginal cuff 2. Avoid intercourse for another 2 weeks 3. Resume all activities without restriction 4. Return in 2 weeks for follow-up  [name], MD  Note: This dictation was prepared with Dragon dictation along with smaller phrase technology. Any transcriptional errors that result from this process are unintentional.

## 2017-09-05 NOTE — Patient Instructions (Signed)
1. Monsel's solution is applied to granulation tissue in the vagina today 2. No intercourse for 2 weeks 3. Resume all her activities as tolerated 4. Return in 2 weeks for follow-up

## 2017-09-06 ENCOUNTER — Telehealth: Payer: Self-pay | Admitting: Gastroenterology

## 2017-09-06 NOTE — Telephone Encounter (Signed)
Patient left a voice message that she would like to get her results.

## 2017-09-07 LAB — URINE CULTURE: Organism ID, Bacteria: NO GROWTH

## 2017-09-08 NOTE — Telephone Encounter (Signed)
Returned patients call.

## 2017-09-10 NOTE — Telephone Encounter (Signed)
Inform   1.HIDA scan was normal  2. Cyst seen in liver on ultrasound reported as benign but I would suggest a repeat in 6 months

## 2017-09-11 ENCOUNTER — Telehealth: Payer: Self-pay

## 2017-09-11 NOTE — Telephone Encounter (Signed)
Advised patient of results per Dr. Vicente Males.   Inform   1.HIDA scan was normal  2. Cyst seen in liver on ultrasound reported as benign but I would suggest a repeat in 6 months

## 2017-09-13 ENCOUNTER — Ambulatory Visit (INDEPENDENT_AMBULATORY_CARE_PROVIDER_SITE_OTHER): Payer: 59 | Admitting: Gastroenterology

## 2017-09-13 ENCOUNTER — Encounter: Payer: Self-pay | Admitting: Gastroenterology

## 2017-09-13 VITALS — BP 110/70 | HR 67 | Temp 97.6°F | Ht 64.0 in | Wt 124.8 lb

## 2017-09-13 DIAGNOSIS — R1013 Epigastric pain: Secondary | ICD-10-CM | POA: Diagnosis not present

## 2017-09-13 DIAGNOSIS — K5909 Other constipation: Secondary | ICD-10-CM

## 2017-09-13 DIAGNOSIS — G8929 Other chronic pain: Secondary | ICD-10-CM | POA: Diagnosis not present

## 2017-09-13 NOTE — Progress Notes (Signed)
Jonathon Bellows MD, MRCP(U.K) [address]  [place]  [place], [place] [address]  Main: [phone]  Fax: [phone]   Primary Care Physician: Steele Sizer, MD  Primary Gastroenterologist:  Dr. Jonathon Bellows   No chief complaint on file.   HPI: Jessica Carroll is a 46 y.o. female    She is here today for a follow up for nausea.  Summary of history : She says she has had nausea for a few months , some days has none. 2-3 times a week. It lasts a few hours, peptobismol helps. She takes Dexilant daily , does not help the nausea. H Pylori breath test negative.She does have migraines, when she has migraines gets nauseous. Denies any weight loss. Does not every vomit. She is going to undergo a cervical surgery in August . She does wake up in the night feeling nauseous. Denies use of NSAID's. She has a bowel movement daily , she is on linzess, sometimes is soft and sometimes is hard, after a bowel movement feels better with the nausea.    Interval history   07/13/2017-  09/13/2017    09/01/17- USG abdomen RUQ-benign cyst of the liver-mentioned as complex  , no biliary dilation . HIDA scan was normal .  She underwent a hysterectomy and was found to have leiomyoma  Since last visit has been having epigastric pain . On dexilant. She is having a bowel movement once every few days. She didn't increase her dose of linzess last visit to 145 mcg .Marland KitchenNever had  Colonoscopy.     Current Outpatient Prescriptions  Medication Sig Dispense Refill  . albuterol (PROVENTIL HFA) 108 (90 Base) MCG/ACT inhaler Inhale 2 puffs into the lungs every 6 (six) hours as needed for wheezing or shortness of breath. 1 Inhaler 1  . ALPRAZolam (XANAX) 0.5 MG tablet Take 1 tablet (0.5 mg total) by mouth 2 (two) times daily as needed for anxiety. 25 tablet 0  . baclofen (LIORESAL) 20 MG tablet Take 0.5-1 tablets (10-20 mg total) by mouth 3 (three) times daily as needed for muscle spasms. When off work take more than  qhs 30 each 0  . DEXILANT 60 MG capsule Take 1 capsule (60 mg total) by mouth daily. 30 capsule 2  . FLUARIX QUADRIVALENT 0.5 ML injection inject 0.5 milliliter intramuscularly  0  . Fluticasone-Salmeterol (ADVAIR) 250-50 MCG/DOSE AEPB Inhale 2 puffs into the lungs 2 (two) times daily. 60 each 5  . ibuprofen (ADVIL,MOTRIN) 800 MG tablet   1  . linaclotide (LINZESS) 72 MCG capsule TAKE 1 CAPSULE(72 MCG) BY MOUTH DAILY BEFORE BREAKFAST 30 capsule 5  . MULTIPLE VITAMIN PO Take 1 tablet by mouth daily.    . prednisoLONE acetate (PRED FORTE) 1 % ophthalmic suspension Place 1 drop into the right eye daily.     . promethazine (PHENERGAN) 12.5 MG tablet Take 1 tablet (12.5 mg total) by mouth every 8 (eight) hours as needed for nausea or vomiting. 20 tablet 0  . ranitidine (ZANTAC) 300 MG capsule Take 1 capsule (300 mg total) by mouth every evening. 30 capsule 0  . rizatriptan (MAXALT) 10 MG tablet Take 1 tablet (10 mg total) by mouth 2 (two) times daily as needed. 10 tablet 2  . STOOL SOFTENER 100 MG capsule   0  . Travoprost, BAK Free, (TRAVATAN) 0.004 % SOLN ophthalmic solution Place 1 drop into the right eye at bedtime.    . triamcinolone cream (KENALOG) 0.1 % Apply 1 application topically 2 (two)  times daily as needed. 30 g 0  . valACYclovir (VALTREX) 500 MG tablet Take 1 tablet by mouth 2 (two) times daily.  0   No current facility-administered medications for this visit.     Allergies as of 09/13/2017 - Review Complete 09/05/2017  Allergen Reaction Noted  . Penicillins Nausea Only 03/31/2015    ROS:  General: Negative for anorexia, weight loss, fever, chills, fatigue, weakness. ENT: Negative for hoarseness, difficulty swallowing , nasal congestion. CV: Negative for chest pain, angina, palpitations, dyspnea on exertion, peripheral edema.  Respiratory: Negative for dyspnea at rest, dyspnea on exertion, cough, sputum, wheezing.  GI: See history of present illness. GU:  Negative for dysuria,  hematuria, urinary incontinence, urinary frequency, nocturnal urination.  Endo: Negative for unusual weight change.    Physical Examination:   LMP 07/11/2017 (Exact Date)   General: Well-nourished, well-developed in no acute distress.  Eyes: No icterus. Conjunctivae pink. Mouth: Oropharyngeal mucosa moist and pink , no lesions erythema or exudate. Lungs: Clear to auscultation bilaterally. Non-labored. Heart: Regular rate and rhythm, no murmurs rubs or gallops.  Abdomen: Bowel sounds are normal, nontender, nondistended, no hepatosplenomegaly or masses, no abdominal bruits or hernia , no rebound or guarding.   Extremities: No lower extremity edema. No clubbing or deformities. Neuro: Alert and oriented x 3.  Grossly intact. Skin: Warm and dry, no jaundice.   Psych: Alert and cooperative, normal mood and affect.   Imaging Studies: Nm Hepato W/eject Fract  Result Date: 09/01/2017 CLINICAL DATA:  Nausea, chronic EXAM: NUCLEAR MEDICINE HEPATOBILIARY IMAGING WITH GALLBLADDER EF VIEWS: Anterior right upper quadrant RADIOPHARMACEUTICALS:  5.05 mCi Tc-24m  Choletec IV COMPARISON:  None. FINDINGS: Liver uptake of radiotracer is normal. There is prompt visualization of gallbladder and small bowel, indicating patency of the cystic and common bile ducts. The patient consumed 8 ounces of Ensure orally with calculation of the computer generated ejection fraction of radiotracer from the gallbladder. There is no report of clinical symptoms with the oral Ensure consumption. The computer generated ejection fraction of radiotracer from the gallbladder is normal at 46%, normal greater than 33% using the oral agent. IMPRESSION: Study within normal limits. Electronically Signed   By: Lowella Grip III M.D.   On: 09/01/2017 14:27   US Abdomen Limited Ruq  Result Date: 09/01/2017 CLINICAL DATA:  Right upper quadrant abdominal pain, nausea EXAM: ULTRASOUND ABDOMEN LIMITED RIGHT UPPER QUADRANT COMPARISON:  CT  08/23/2017 FINDINGS: Gallbladder: No gallstones or wall thickening visualized. No sonographic Murphy sign noted by sonographer. Common bile duct: Diameter: Normal caliber, 3 mm Liver: Complex cyst in the left hepatic lobe measures 2.3 x 2.0 x 1.9 cm. Internal septations noted which are thin. No internal blood flow or nodularity. Findings most compatible with mildly complex septated cysts, benign. 12 mm simple appearing cyst in the left lobe. Normal echotexture. No biliary ductal dilatation. Portal vein is patent on color Doppler imaging with normal direction of blood flow towards the liver. IMPRESSION: No acute findings.  Benign-appearing cysts in the left hepatic lobe. Electronically Signed   By: Rolm Baptise M.D.   On: 09/01/2017 12:30    Assessment and Plan:   Jessica Carroll is a 46 y.o. y/o female *here to follow up for  bloating , nausea. She also has constipation . It is likely her symptoms are possibly from a combination of GERD +/- constipation +/- bloating. Since last visit constipation no better since she didn't increase the dose. Still has epigastric pain.   Plan 1.  Increase Linzess to 290 mcg a day - samples for 2 weeks given  2. Complex liver cyst repeat USG liver in 6 months  3. EGD for abdominal pain and colonoscopy for constipation  4. Suprep sample provided  I have discussed alternative options, risks & benefits,  which include, but are not limited to, bleeding, infection, perforation,respiratory complication & drug reaction.  The patient agrees with this plan & written consent will be obtained.    Dr Jonathon Bellows  MD,MRCP [facility]) Follow up in 6 weeks

## 2017-09-13 NOTE — Addendum Note (Signed)
Addended by: Peggye Ley on: 09/13/2017 10:16 AM   Modules accepted: Orders, SmartSet

## 2017-09-20 ENCOUNTER — Encounter: Payer: Self-pay | Admitting: Obstetrics and Gynecology

## 2017-09-20 ENCOUNTER — Ambulatory Visit (INDEPENDENT_AMBULATORY_CARE_PROVIDER_SITE_OTHER): Payer: 59 | Admitting: Obstetrics and Gynecology

## 2017-09-20 VITALS — BP 101/69 | HR 69 | Ht 65.0 in | Wt 123.6 lb

## 2017-09-20 DIAGNOSIS — Z90710 Acquired absence of both cervix and uterus: Secondary | ICD-10-CM | POA: Diagnosis not present

## 2017-09-20 DIAGNOSIS — N939 Abnormal uterine and vaginal bleeding, unspecified: Secondary | ICD-10-CM

## 2017-09-20 DIAGNOSIS — L929 Granulomatous disorder of the skin and subcutaneous tissue, unspecified: Secondary | ICD-10-CM | POA: Diagnosis not present

## 2017-09-20 NOTE — Patient Instructions (Signed)
1. Persistent granulation tissue is removed today 2. Monsel solution is applied to help the healing process 3. Absolutely no intercourse for 2 weeks until follow-up appointment 4. Return in 2 weeks for follow-up

## 2017-09-22 LAB — PATHOLOGY

## 2017-09-27 NOTE — Progress Notes (Signed)
Chief complaint: 1. Vaginal granulation tissue 2. History of vaginal hysterectomy  Jessica Carroll presents today for follow-up. Previously she was noted to have granulation tissue at her postoperative check. This has since been treated with Monsel's 1. She does report some persistent spotting without pelvic pain.  Past medical history: 07/31/2017 TVH for endometriosis  Review of systems: [place] 0 review of systems is negative except for that noted in history of present illness OBJECTIVE: BP 101/69   Pulse 69   Ht 5\' 5"  (1.651 m)   Wt 123 lb 9.6 oz (56.1 kg)   LMP 07/11/2017 (Exact Date)   BMI 20.57 kg/m  Pleasant well-appearing female in no acute distress. Alert and oriented. Abdomen: Soft, nontender Pelvic exam: External genitalia-normal BUS-normal Vagina-minimal bloody secretions the vaginal vault; vagina has good estrogen effect; the vaginal cuff is notable for moderate granulation tissue which is friable. Bimanual-mild vaginal cuff tenderness; vaginal cuff intact; no pelvic masses Rectovaginal-normal external exam  PROCEDURE: Excision of granulation tissue and application of Monsel solution Verbal consent is given. Patient was placed in dorsal lithotomy position. A Graves' speculum was placed in the vagina. Tissue biopsy forceps is used to remove residual granulation tissue. Monsel solution is applied to the biopsy sites to effect hemostasis. Blood loss is minimal. Complications are none.  ASSESSMENT: 1. Symptomatic recurrent vaginal granulation tissue following TVH  PLAN: 1. Excision of granulation tissue and application of Monsel solution is noted 2. Return in 2 weeks for follow-up 3. Monitor for any pelvic pain, vaginal spotting, or vaginal discharge. 4. Avoid intercourse for another 2 weeks.  A total of 15 minutes were spent face-to-face with the patient during this encounter and over half of that time dealt with counseling and coordination of care.  [name], MD  Note: This dictation was prepared with Dragon dictation along with smaller phrase technology. Any transcriptional errors that result from this process are unintentional. .ma

## 2017-09-29 ENCOUNTER — Encounter: Payer: Self-pay | Admitting: *Deleted

## 2017-09-29 ENCOUNTER — Ambulatory Visit: Payer: Commercial Managed Care - HMO | Admitting: Certified Registered Nurse Anesthetist

## 2017-09-29 ENCOUNTER — Ambulatory Visit
Admission: RE | Admit: 2017-09-29 | Discharge: 2017-09-29 | Disposition: A | Discharge status: Home / Self Care | Payer: Commercial Managed Care - HMO | Source: Ambulatory Visit | Attending: Gastroenterology | Admitting: Gastroenterology

## 2017-09-29 ENCOUNTER — Encounter
Admission: RE | Disposition: A | Discharge status: Home / Self Care | Payer: Self-pay | Source: Ambulatory Visit | Attending: Gastroenterology

## 2017-09-29 DIAGNOSIS — Z90710 Acquired absence of both cervix and uterus: Secondary | ICD-10-CM | POA: Diagnosis not present

## 2017-09-29 DIAGNOSIS — K219 Gastro-esophageal reflux disease without esophagitis: Secondary | ICD-10-CM | POA: Insufficient documentation

## 2017-09-29 DIAGNOSIS — K640 First degree hemorrhoids: Secondary | ICD-10-CM | POA: Diagnosis not present

## 2017-09-29 DIAGNOSIS — F419 Anxiety disorder, unspecified: Secondary | ICD-10-CM | POA: Insufficient documentation

## 2017-09-29 DIAGNOSIS — Z8249 Family history of ischemic heart disease and other diseases of the circulatory system: Secondary | ICD-10-CM | POA: Diagnosis not present

## 2017-09-29 DIAGNOSIS — G8929 Other chronic pain: Secondary | ICD-10-CM | POA: Diagnosis not present

## 2017-09-29 DIAGNOSIS — R1013 Epigastric pain: Secondary | ICD-10-CM | POA: Diagnosis not present

## 2017-09-29 DIAGNOSIS — K5909 Other constipation: Secondary | ICD-10-CM | POA: Diagnosis not present

## 2017-09-29 DIAGNOSIS — Z880 Allergy status to penicillin: Secondary | ICD-10-CM | POA: Insufficient documentation

## 2017-09-29 DIAGNOSIS — K2950 Unspecified chronic gastritis without bleeding: Secondary | ICD-10-CM | POA: Diagnosis not present

## 2017-09-29 DIAGNOSIS — J45909 Unspecified asthma, uncomplicated: Secondary | ICD-10-CM | POA: Insufficient documentation

## 2017-09-29 DIAGNOSIS — F329 Major depressive disorder, single episode, unspecified: Secondary | ICD-10-CM | POA: Insufficient documentation

## 2017-09-29 DIAGNOSIS — Z79899 Other long term (current) drug therapy: Secondary | ICD-10-CM | POA: Diagnosis not present

## 2017-09-29 DIAGNOSIS — Z947 Corneal transplant status: Secondary | ICD-10-CM | POA: Insufficient documentation

## 2017-09-29 DIAGNOSIS — K5900 Constipation, unspecified: Secondary | ICD-10-CM | POA: Insufficient documentation

## 2017-09-29 HISTORY — PX: ESOPHAGOGASTRODUODENOSCOPY (EGD) WITH PROPOFOL: SHX5813

## 2017-09-29 HISTORY — PX: COLONOSCOPY WITH PROPOFOL: SHX5780

## 2017-09-29 SURGERY — COLONOSCOPY WITH PROPOFOL
Anesthesia: General

## 2017-09-29 MED ORDER — PROPOFOL 500 MG/50ML IV EMUL
INTRAVENOUS | Status: DC | PRN
  Administered 2017-09-29: 150 ug/kg/min via INTRAVENOUS

## 2017-09-29 MED ORDER — PROPOFOL 10 MG/ML IV BOLUS
INTRAVENOUS | Status: DC | PRN
  Administered 2017-09-29: 20 mg via INTRAVENOUS
  Administered 2017-09-29: 50 mg via INTRAVENOUS

## 2017-09-29 MED ORDER — LIDOCAINE HCL (CARDIAC) 20 MG/ML IV SOLN
INTRAVENOUS | Status: DC | PRN
  Administered 2017-09-29 (×2): 50 mg via INTRAVENOUS

## 2017-09-29 MED ORDER — GLYCOPYRROLATE 0.2 MG/ML IJ SOLN
INTRAMUSCULAR | Status: AC
  Filled 2017-09-29: qty 1

## 2017-09-29 MED ORDER — GLYCOPYRROLATE 0.2 MG/ML IJ SOLN
INTRAMUSCULAR | Status: DC | PRN
  Administered 2017-09-29: 0.2 mg via INTRAVENOUS

## 2017-09-29 MED ORDER — LIDOCAINE HCL (PF) 2 % IJ SOLN
INTRAMUSCULAR | Status: AC
  Filled 2017-09-29: qty 10

## 2017-09-29 MED ORDER — PROPOFOL 10 MG/ML IV BOLUS
INTRAVENOUS | Status: AC
  Filled 2017-09-29: qty 20

## 2017-09-29 MED ORDER — SODIUM CHLORIDE 0.9 % IV SOLN
INTRAVENOUS | Status: DC
  Administered 2017-09-29: 08:00:00 via INTRAVENOUS

## 2017-09-29 MED ORDER — PROPOFOL 500 MG/50ML IV EMUL
INTRAVENOUS | Status: AC
  Filled 2017-09-29: qty 50

## 2017-09-29 NOTE — H&P (Signed)
Jonathon Bellows MD [address], [place], [place] [address] Phone: [phone] Fax : [phone]  Primary Care Physician:  Steele Sizer, MD Primary Gastroenterologist:  Dr. Jonathon Bellows   Pre-Procedure History & Physical: HPI:  Jessica Carroll is a 46 y.o. female is here for an endoscopy and colonoscopy.   Past Medical History:  Diagnosis Date  . Anxiety   . Asthma 2011  . Depression   . Diffuse cystic mastopathy   . Endometriosis   . GERD (gastroesophageal reflux disease)   . Headache     Past Surgical History:  Procedure Laterality Date  . CORNEAL TRANSPLANT Right 01/07/2013  . EYE SURGERY    . eyelid surgery Right June 19, 2015   [facility] by Dr. Norlene Duel  . LAPAROSCOPIC BILATERAL SALPINGECTOMY N/A 01/25/2016   Procedure: LAPAROSCOPIC BILATERAL SALPINGECTOMY, ADHESIOLYSIS, LAPAROSCOPIC EXCISION/FULGERATION ENDOMETRIOSIS ;  Surgeon: [name], MD;  Location: [facility] ORS;  Service: Gynecology;  Laterality: N/A;  . Laparoscopic excision and fulguration of endometriosis N/A    bladder flap and uterosacral ligament endometriosis, excised and cauterized  . UPPER GI ENDOSCOPY  07/19/2012  . VAGINAL HYSTERECTOMY N/A 07/31/2017   Procedure: HYSTERECTOMY VAGINAL;  Surgeon: [name], MD;  Location: [facility] ORS;  Service: Gynecology;  Laterality: N/A;    Prior to Admission medications   Medication Sig Start Date End Date Taking? Authorizing Provider  Fluticasone-Salmeterol (ADVAIR) 250-50 MCG/DOSE AEPB Inhale 2 puffs into the lungs 2 (two) times daily. 04/18/17  Yes Sowles, Drue Stager, MD  MULTIPLE VITAMIN PO Take 1 tablet by mouth daily.   Yes [provider]  rizatriptan (MAXALT) 10 MG tablet Take 1 tablet (10 mg total) by mouth 2 (two) times daily as needed. 01/09/17  Yes Sowles, Drue Stager, MD  albuterol (PROVENTIL HFA) 108 (90 Base) MCG/ACT inhaler Inhale 2 puffs into the lungs every 6 (six) hours as needed for wheezing or shortness of breath. 04/18/17    Steele Sizer, MD  ALPRAZolam Duanne Moron) 0.5 MG tablet Take 1 tablet (0.5 mg total) by mouth 2 (two) times daily as needed for anxiety. 04/10/17   Hubbard Hartshorn, FNP  DEXILANT 60 MG capsule Take 1 capsule (60 mg total) by mouth daily. 05/29/17   Steele Sizer, MD  ibuprofen (ADVIL,MOTRIN) 800 MG tablet  08/01/17   [provider]  prednisoLONE acetate (PRED FORTE) 1 % ophthalmic suspension Place 1 drop into the right eye daily.  01/19/17   [provider]  promethazine (PHENERGAN) 12.5 MG tablet Take 1 tablet (12.5 mg total) by mouth every 8 (eight) hours as needed for nausea or vomiting. 08/22/17   Steele Sizer, MD  ranitidine (ZANTAC) 300 MG capsule Take 1 capsule (300 mg total) by mouth every evening. 07/21/17 09/20/17  Jonathon Bellows, MD  STOOL SOFTENER 100 MG capsule  08/01/17   [provider]  Travoprost, BAK Free, (TRAVATAN) 0.004 % SOLN ophthalmic solution Place 1 drop into the right eye at bedtime.    [provider]  triamcinolone cream (KENALOG) 0.1 % Apply 1 application topically 2 (two) times daily as needed. 01/09/17   Steele Sizer, MD  valACYclovir (VALTREX) 500 MG tablet Take 1 tablet by mouth 2 (two) times daily. 11/30/15   Dionne Bucy, MD    Allergies as of 09/13/2017 - Review Complete 09/13/2017  Allergen Reaction Noted  . Penicillins Nausea Only 03/31/2015    Family History  Problem Relation Age of Onset  . Adopted: Yes  . Hyperlipidemia Mother   .  Hypertension Mother   . Diabetes Father   . Heart failure Father 57       hear attack  . Cancer Neg Hx   . Breast cancer Neg Hx     Social History   Social History  . Marital status: Single    Spouse name: N/A  . Number of children: N/A  . Years of education: 2   Occupational History  . textile    Social History Main Topics  . Smoking status: Never Smoker  . Smokeless tobacco: Never Used  . Alcohol use 0.0 oz/week     Comment: occ. wine  . Drug use: No  . Sexual  activity: Yes    Partners: Male    Birth control/ protection: None, Surgical   Other Topics Concern  . Not on file   Social History Narrative  . No narrative on file    Review of Systems: See HPI, otherwise negative ROS  Physical Exam: BP 121/67   Pulse 85   Temp (!) 97 F (36.1 C)   Resp 16   Ht 5\' 5"  (1.651 m)   Wt 124 lb (56.2 kg)   LMP 07/11/2017 (Exact Date)   SpO2 100%   BMI 20.63 kg/m  General:   Alert,  pleasant and cooperative in NAD Head:  Normocephalic and atraumatic. Neck:  Supple; no masses or thyromegaly. Lungs:  Clear throughout to auscultation.    Heart:  Regular rate and rhythm. Abdomen:  Soft, nontender and nondistended. Normal bowel sounds, without guarding, and without rebound.   Neurologic:  Alert and  oriented x4;  grossly normal neurologically.  Impression/Plan: Jessica Carroll is here for an endoscopy and colonoscopy to be performed for abdominal pain and constipation   Risks, benefits, limitations, and alternatives regarding  endoscopy and colonoscopy have been reviewed with the patient.  Questions have been answered.  All parties agreeable.   Jonathon Bellows, MD  09/29/2017, 8:01 AM

## 2017-09-29 NOTE — Anesthesia Post-op Follow-up Note (Signed)
Anesthesia QCDR form completed.        

## 2017-09-29 NOTE — Anesthesia Preprocedure Evaluation (Signed)
Anesthesia Evaluation  Patient identified by MRN, date of birth, ID band Patient awake    Reviewed: Allergy & Precautions, H&P , NPO status , Patient's Chart, lab work & pertinent test results, reviewed documented beta blocker date and time   History of Anesthesia Complications (+) PONV and history of anesthetic complications  Airway Mallampati: II  TM Distance: >3 FB Neck ROM: full    Dental  (+) Teeth Intact   Pulmonary neg shortness of breath, asthma , neg sleep apnea, neg recent URI,           Cardiovascular negative cardio ROS       Neuro/Psych  Headaches, neg Seizures PSYCHIATRIC DISORDERS (Depression)    GI/Hepatic Neg liver ROS, GERD  ,  Endo/Other  negative endocrine ROS  Renal/GU negative Renal ROS  negative genitourinary   Musculoskeletal   Abdominal   Peds  Hematology negative hematology ROS (+)   Anesthesia Other Findings Past Medical History:   Diffuse cystic mastopathy                                    Asthma                                          2011         GERD (gastroesophageal reflux disease)                       Anxiety                                                      Headache                                                   Past Surgical History:   UPPER GI ENDOSCOPY                               07/19/2012   CORNEAL TRANSPLANT                              Right 01/07/2013    eyelid surgery                                  Right July 8, 2*     Comment:[place] by Dr. Norlene Duel BMI    Body Mass Index   20.63 kg/m 2     Reproductive/Obstetrics negative OB ROS                             Anesthesia Physical  Anesthesia Plan  ASA: II  Anesthesia Plan: General   Post-op Pain Management:    Induction: Intravenous  PONV Risk Score and Plan: 4 or greater and Propofol infusion  Airway Management Planned: Nasal Cannula  Additional Equipment:    Intra-op Plan:   Post-operative Plan: Extubation in OR  Informed Consent: I have reviewed the patients History and Physical, chart, labs and discussed the procedure including the risks, benefits and alternatives for the proposed anesthesia with the patient or authorized representative who has indicated his/her understanding and acceptance.   Dental Advisory Given  Plan Discussed with: CRNA  Anesthesia Plan Comments:         Anesthesia Quick Evaluation

## 2017-09-29 NOTE — Transfer of Care (Signed)
Immediate Anesthesia Transfer of Care Note  Patient: LASTACIA SOLUM  Procedure(s) Performed: COLONOSCOPY WITH PROPOFOL (N/A ) ESOPHAGOGASTRODUODENOSCOPY (EGD) WITH PROPOFOL (N/A )  Patient Location: PACU  Anesthesia Type:General  Level of Consciousness: awake, alert , oriented and patient cooperative  Airway & Oxygen Therapy: Patient Spontanous Breathing and Patient connected to nasal cannula oxygen  Post-op Assessment: Report given to RN and Post -op Vital signs reviewed and stable  Post vital signs: Reviewed and stable  Last Vitals:  Vitals:   09/29/17 0734 09/29/17 0837  BP: 121/67 117/69  Pulse: 85 89  Resp: 16 (!) 7  Temp: (!) 36.1 C (!) 36.1 C  SpO2: 100% 100%    Last Pain:  Vitals:   09/29/17 0837  TempSrc: Tympanic         Complications: No apparent anesthesia complications

## 2017-09-29 NOTE — Anesthesia Procedure Notes (Signed)
Date/Time: 09/29/2017 8:09 AM Performed by: Darlyne Russian Pre-anesthesia Checklist: Patient identified, Emergency Drugs available, Suction available, Patient being monitored and Timeout performed Patient Re-evaluated:Patient Re-evaluated prior to induction Oxygen Delivery Method: Nasal cannula Placement Confirmation: positive ETCO2

## 2017-09-29 NOTE — Op Note (Signed)
[facility] Gastroenterology Patient Name: Jessica Carroll Procedure Date: 09/29/2017 8:09 AM MRN: [id number] Account #: [id number] Date of Birth: 09-19-1971 Admit Type: Outpatient Age: 46 Room: [facility] ENDO ROOM 4 Gender: Female Note Status: Finalized Procedure:            Upper GI endoscopy Indications:          Epigastric abdominal pain Providers:            Jonathon Bellows MD, MD Referring MD:         Bethena Roys. Sowles, MD (Referring MD) Medicines:            Monitored Anesthesia Care Complications:        No immediate complications. Procedure:            Pre-Anesthesia Assessment:                       - Prior to the procedure, a History and Physical was                        performed, and patient medications, allergies and                        sensitivities were reviewed. The patient's tolerance of                        previous anesthesia was reviewed.                       - The risks and benefits of the procedure and the                        sedation options and risks were discussed with the                        patient. All questions were answered and informed                        consent was obtained.                       - ASA Grade Assessment: III - A patient with severe                        systemic disease.                       After obtaining informed consent, the endoscope was                        passed under direct vision. Throughout the procedure,                        the patient's blood pressure, pulse, and oxygen                        saturations were monitored continuously. The Endoscope                        was introduced through the mouth, and advanced to the  third part of duodenum. The upper GI endoscopy was                        accomplished with ease. The patient tolerated the                        procedure well. Findings:      The examined duodenum was normal.      The esophagus was normal.     The entire examined stomach was normal. Biopsies were taken with a cold       forceps for histology.      The cardia and gastric fundus were normal on retroflexion. Impression:           - Normal examined duodenum.                       - Normal esophagus.                       - Normal stomach. Biopsied. Recommendation:       - Await pathology results.                       - Perform a colonoscopy today. Procedure Code(s):    --- Professional ---                       [phone], Esophagogastroduodenoscopy, flexible, transoral;                        with biopsy, single or multiple Diagnosis Code(s):    --- Professional ---                       R10.13, Epigastric pain CPT copyright 2016 American Medical Association. All rights reserved. The codes documented in this report are preliminary and upon coder review may  be revised to meet current compliance requirements. Jonathon Bellows, MD Jonathon Bellows MD, MD 09/29/2017 8:18:10 AM This report has been signed electronically. Number of Addenda: 0 Note Initiated On: 09/29/2017 8:09 AM      [facility]

## 2017-09-29 NOTE — Op Note (Signed)
[facility] Gastroenterology Patient Name: Jessica Carroll Procedure Date: 09/29/2017 8:08 AM MRN: [id number] Account #: [id number] Date of Birth: September 12, 1971 Admit Type: Outpatient Age: 46 Room: [facility] ENDO ROOM 4 Gender: Female Note Status: Finalized Procedure:            Colonoscopy Indications:          Constipation Providers:            Jonathon Bellows MD, MD Referring MD:         Bethena Roys. Sowles, MD (Referring MD) Medicines:            Monitored Anesthesia Care Complications:        No immediate complications. Procedure:            Pre-Anesthesia Assessment:                       - Prior to the procedure, a History and Physical was                        performed, and patient medications, allergies and                        sensitivities were reviewed. The patient's tolerance of                        previous anesthesia was reviewed.                       - The risks and benefits of the procedure and the                        sedation options and risks were discussed with the                        patient. All questions were answered and informed                        consent was obtained.                       - ASA Grade Assessment: III - A patient with severe                        systemic disease.                       After obtaining informed consent, the colonoscope was                        passed under direct vision. Throughout the procedure,                        the patient's blood pressure, pulse, and oxygen                        saturations were monitored continuously. The Olympus                        CF-H180AL colonoscope ( S#: [id number] ) was introduced  through the anus and advanced to the the cecum,                        identified by the appendiceal orifice, IC valve and                        transillumination. The colonoscopy was performed with                        ease. The patient tolerated the procedure  well. The                        quality of the bowel preparation was excellent. Findings:      Non-bleeding internal hemorrhoids were found during retroflexion. The       hemorrhoids were medium-sized and Grade I (internal hemorrhoids that do       not prolapse).      The exam was otherwise without abnormality on direct and retroflexion       views. Impression:           - Non-bleeding internal hemorrhoids.                       - The examination was otherwise normal on direct and                        retroflexion views.                       - No specimens collected. Recommendation:       - Discharge patient to home (with escort).                       - Advance diet as tolerated.                       - Continue present medications.                       - Repeat colonoscopy in 10 years for screening purposes. Procedure Code(s):    --- Professional ---                       [phone], Colonoscopy, flexible; diagnostic, including                        collection of specimen(s) by brushing or washing, when                        performed (separate procedure) Diagnosis Code(s):    --- Professional ---                       K64.0, First degree hemorrhoids                       K59.00, Constipation, unspecified CPT copyright 2016 American Medical Association. All rights reserved. The codes documented in this report are preliminary and upon coder review may  be revised to meet current compliance requirements. Jonathon Bellows, MD Jonathon Bellows MD, MD 09/29/2017 8:35:38 AM This report has been signed electronically. Number of Addenda: 0 Note Initiated On: 09/29/2017 8:08 AM Scope Withdrawal Time: 0 hours 10 minutes  54 seconds  Total Procedure Duration: 0 hours 14 minutes 25 seconds       [facility]

## 2017-10-02 ENCOUNTER — Ambulatory Visit: Payer: 59 | Admitting: Gastroenterology

## 2017-10-02 LAB — SURGICAL PATHOLOGY

## 2017-10-02 NOTE — Anesthesia Postprocedure Evaluation (Signed)
Anesthesia Post Note  Patient: Jessica Carroll  Procedure(s) Performed: COLONOSCOPY WITH PROPOFOL (N/A ) ESOPHAGOGASTRODUODENOSCOPY (EGD) WITH PROPOFOL (N/A )  Patient location during evaluation: Endoscopy Anesthesia Type: General Level of consciousness: awake and alert Pain management: pain level controlled Vital Signs Assessment: post-procedure vital signs reviewed and stable Respiratory status: spontaneous breathing, nonlabored ventilation, respiratory function stable and patient connected to nasal cannula oxygen Cardiovascular status: blood pressure returned to baseline and stable Postop Assessment: no apparent nausea or vomiting Anesthetic complications: no     Last Vitals:  Vitals:   09/29/17 0847 09/29/17 0917  BP: (!) 111/58 115/66  Pulse: 84 74  Resp: 16   Temp:    SpO2: 100% 100%    Last Pain:  Vitals:   09/30/17 1504  TempSrc:   PainSc: 0-No pain                 Martha Clan

## 2017-10-03 ENCOUNTER — Encounter: Payer: Self-pay | Admitting: Gastroenterology

## 2017-10-04 ENCOUNTER — Encounter: Payer: 59 | Admitting: Obstetrics and Gynecology

## 2017-10-08 ENCOUNTER — Encounter: Payer: Self-pay | Admitting: Gastroenterology

## 2017-10-11 ENCOUNTER — Encounter: Payer: Self-pay | Admitting: Obstetrics and Gynecology

## 2017-10-11 ENCOUNTER — Ambulatory Visit (INDEPENDENT_AMBULATORY_CARE_PROVIDER_SITE_OTHER): Payer: 59 | Admitting: Obstetrics and Gynecology

## 2017-10-11 VITALS — BP 100/60 | HR 66 | Ht 65.0 in | Wt 124.4 lb

## 2017-10-11 DIAGNOSIS — N809 Endometriosis, unspecified: Secondary | ICD-10-CM

## 2017-10-11 DIAGNOSIS — N939 Abnormal uterine and vaginal bleeding, unspecified: Secondary | ICD-10-CM

## 2017-10-11 DIAGNOSIS — L929 Granulomatous disorder of the skin and subcutaneous tissue, unspecified: Secondary | ICD-10-CM

## 2017-10-11 NOTE — Progress Notes (Signed)
Chief complaint: 1.  Abnormal uterine bleeding 2.  Status post TVH 3.  History of granulation tissue of vagina  Jessica Carroll presents for follow-up after her last treatment for persistent granulation tissue at the vaginal cuff.  Since her last visit 2 weeks ago she has not had any further bleeding, discharge, or pelvic pain.  She has not had intercourse since her surgery.  Past medical history, past surgical history, problem list, medications, and allergies are reviewed  OBJECTIVE: BP 100/60   Pulse 66   Ht 5\' 5"  (1.651 m)   Wt 124 lb 6.4 oz (56.4 kg)   LMP 07/11/2017 (Exact Date)   BMI 20.70 kg/m  Pleasant well-appearing female no acute distress.  Alert and oriented. Abdomen: Soft, nontender Pelvic exam: External genitalia-normal BUS-normal Vagina-good vault support; no vaginal bleeding; white discharge present Cervix-surgically absent Uterus-surgically absent Bimanual-no palpable masses or tenderness; vaginal cuff is intact Rectovaginal-normal external exam Extremities:warm and dry  ASSESSMENT: 1.  History of abnormal uterine bleeding secondary to granulation tissue following vaginal hysterectomy, now resolved 2.  History of endometriosis  PLAN: 1.  Resume all activities without restriction 2.  Recommend annual gynecologic follow-up to monitor for endometriosis symptoms. 3.  Resume routine care with Dr. Ancil Boozer  A total of 15 minutes were spent face-to-face with the patient during this encounter and over half of that time dealt with counseling and coordination of care.  [name], MD  Note: This dictation was prepared with Dragon dictation along with smaller phrase technology. Any transcriptional errors that result from this process are unintentional.

## 2017-10-11 NOTE — Patient Instructions (Signed)
1.  Resume all activities without restriction 2.  Return in 1 year for follow-up on endometriosis

## 2017-10-19 ENCOUNTER — Other Ambulatory Visit: Payer: Self-pay | Admitting: Family Medicine

## 2017-10-19 MED ORDER — IBUPROFEN 800 MG PO TABS: 800.0000 mg | ORAL_TABLET | Freq: Three times a day (TID) | ORAL | 1 refills | Status: DC | PRN

## 2017-10-19 NOTE — Telephone Encounter (Signed)
Copied from [place]. Topic: Quick Communication - See Telephone Encounter >> Oct 19, 2017  9:26 AM Burnis Medin, NT wrote: CRM for notification. See Telephone encounter for: Pt. Is calling to see if she can get a refill on  ibuprofen (ADVIL,MOTRIN) 800 MG tablet. Pt. Uses [facility] in [place]. 10/19/17.

## 2017-11-06 ENCOUNTER — Ambulatory Visit: Payer: 59 | Admitting: Family Medicine

## 2017-11-22 ENCOUNTER — Ambulatory Visit (INDEPENDENT_AMBULATORY_CARE_PROVIDER_SITE_OTHER): Payer: 59 | Admitting: Family Medicine

## 2017-11-22 ENCOUNTER — Encounter: Payer: Self-pay | Admitting: Family Medicine

## 2017-11-22 VITALS — BP 96/66 | HR 73 | Resp 14 | Ht 65.0 in | Wt 125.8 lb

## 2017-11-22 DIAGNOSIS — J4540 Moderate persistent asthma, uncomplicated: Secondary | ICD-10-CM | POA: Diagnosis not present

## 2017-11-22 DIAGNOSIS — R590 Localized enlarged lymph nodes: Secondary | ICD-10-CM

## 2017-11-22 DIAGNOSIS — E538 Deficiency of other specified B group vitamins: Secondary | ICD-10-CM

## 2017-11-22 DIAGNOSIS — J302 Other seasonal allergic rhinitis: Secondary | ICD-10-CM | POA: Diagnosis not present

## 2017-11-22 DIAGNOSIS — G43009 Migraine without aura, not intractable, without status migrainosus: Secondary | ICD-10-CM

## 2017-11-22 DIAGNOSIS — Z20818 Contact with and (suspected) exposure to other bacterial communicable diseases: Secondary | ICD-10-CM

## 2017-11-22 DIAGNOSIS — K219 Gastro-esophageal reflux disease without esophagitis: Secondary | ICD-10-CM | POA: Diagnosis not present

## 2017-11-22 MED ORDER — DEXILANT 60 MG PO CPDR: 60.0000 mg | DELAYED_RELEASE_CAPSULE | Freq: Every day | ORAL | 5 refills | Status: DC

## 2017-11-22 MED ORDER — RIZATRIPTAN BENZOATE 10 MG PO TABS
10.0000 mg | ORAL_TABLET | Freq: Two times a day (BID) | ORAL | 2 refills | Status: DC | PRN
Start: 2017-11-22 — End: 2019-09-06

## 2017-11-22 MED ORDER — FLUTICASONE-SALMETEROL 250-50 MCG/DOSE IN AEPB: 2.0000 | INHALATION_SPRAY | Freq: Two times a day (BID) | RESPIRATORY_TRACT | 5 refills | Status: DC

## 2017-11-22 MED ORDER — FLUTICASONE PROPIONATE 50 MCG/ACT NA SUSP: 2.0000 | Freq: Every day | NASAL | 2 refills | Status: DC

## 2017-11-22 MED ORDER — DOXYCYCLINE HYCLATE 100 MG PO TABS: 100.0000 mg | ORAL_TABLET | Freq: Two times a day (BID) | ORAL | 0 refills | Status: DC

## 2017-11-22 MED ORDER — FLUCONAZOLE 150 MG PO TABS: 150.0000 mg | ORAL_TABLET | ORAL | 0 refills | Status: DC

## 2017-11-22 NOTE — Progress Notes (Signed)
Name: Jessica Carroll   MRN: [id number]    DOB: January 13, 1971   Date:11/22/2017       Progress Note  Subjective  Chief Complaint  Chief Complaint  Patient presents with  . Follow-up    HPI  Asthma:  She has been taking Advair twice daily and Ventolin a couple times a week. She states still has a dry cough occasionally, no wheezing or SOB  GERD/dysphagia:seen by Dr. Vicente Males, had EGD and colonoscopy showed hemorrhoids and chronic gastritis doing well at this time and is taking medication as prescribed  Migraine:  no recent episodes, she needs refill of Maxalt, rx has expired. Headache is described as throbbing, usually frontal or nuchal, associated with photophobia, and phonophobia. She has nausea, no vomiting with episodes. She is currently having a headache for the past week, it was severe last night, did not take medication for it. I will send a refill of medications, she is under more stress, her boyfriend has guardianship of his grandaughter and they just found out that they are going to court in January to have full custody of her soon.   Chronic constipation: she states she has daily bowel movement with medication, doing better with lower dose of Linzess , no longer has to rush to use the bathroom.   Anxiety: she worries a lot and when very stressed it causes her chest to hurt and she needs to leave and take alprazolam, 30 pills lasts about 3 months, discussed to use only prn. Discussed SSRI to help with hot flashes and anxiety, but she states it affected her sex drive in the past.         Sore throat: she developed right side sore throat a few weeks ago, went to urgent given Omnicef for possible OM, she only    took a few days because it caused nausea and states has a nodule on right side of neck and is now worried because boyfriend    has strep throat, we will treat with antibiotics but she was advised to call back for referral to ENT if no resolution of lump     Patient Active  Problem List   Diagnosis Date Noted  . Type A blood, Rh positive 08/22/2017  . S/P vaginal hysterectomy 07/31/2017  . Recurrent vaginitis 01/10/2017  . Hemorrhoids, external without complications 85/88/5027  . Nausea in adult patient 02/24/2016  . Endometriosis determined by laparoscopy 01/25/2016  . Chronic constipation 08/14/2015  . Bilateral ganglion cysts of wrists 08/14/2015  . Mild mitral regurgitation 05/19/2015  . B12 deficiency 05/18/2015  . Asthma, moderate 05/18/2015  . Anxiety 05/18/2015  . Insomnia, persistent 05/18/2015  . Eczema of hand 05/18/2015  . Viral keratitis 05/18/2015  . Migraine without aura and without status migrainosus, not intractable 05/18/2015  . Numerous moles 05/18/2015  . Allergic rhinitis 05/18/2015  . History of corneal transplant 05/18/2015  . Gastroesophageal reflux disease without esophagitis 12/16/2009    Past Surgical History:  Procedure Laterality Date  . COLONOSCOPY WITH PROPOFOL N/A 09/29/2017   Procedure: COLONOSCOPY WITH PROPOFOL;  Surgeon: Jonathon Bellows, MD;  Location: [facility] ENDOSCOPY;  Service: Gastroenterology;  Laterality: N/A;  . CORNEAL TRANSPLANT Right 01/07/2013  . ESOPHAGOGASTRODUODENOSCOPY (EGD) WITH PROPOFOL N/A 09/29/2017   Procedure: ESOPHAGOGASTRODUODENOSCOPY (EGD) WITH PROPOFOL;  Surgeon: Jonathon Bellows, MD;  Location: [facility] ENDOSCOPY;  Service: Gastroenterology;  Laterality: N/A;  . EYE SURGERY    . eyelid surgery Right June 19, 2015   [facility] by Dr. Norlene Duel  .  LAPAROSCOPIC BILATERAL SALPINGECTOMY N/A 01/25/2016   Procedure: LAPAROSCOPIC BILATERAL SALPINGECTOMY, ADHESIOLYSIS, LAPAROSCOPIC EXCISION/FULGERATION ENDOMETRIOSIS ;  Surgeon: [name], MD;  Location: [facility] ORS;  Service: Gynecology;  Laterality: N/A;  . Laparoscopic excision and fulguration of endometriosis N/A    bladder flap and uterosacral ligament endometriosis, excised and cauterized  . UPPER GI ENDOSCOPY  07/19/2012  . VAGINAL HYSTERECTOMY N/A  07/31/2017   Procedure: HYSTERECTOMY VAGINAL;  Surgeon: [name], MD;  Location: [facility] ORS;  Service: Gynecology;  Laterality: N/A;    Family History  Adopted: Yes  Problem Relation Age of Onset  . Hyperlipidemia Mother   . Hypertension Mother   . Diabetes Father   . Heart failure Father 28       hear attack  . Cancer Neg Hx   . Breast cancer Neg Hx     Social History   Socioeconomic History  . Marital status: Single    Spouse name: Not on file  . Number of children: Not on file  . Years of education: 17  . Highest education level: Not on file  Social Needs  . Financial resource strain: Not on file  . Food insecurity - worry: Not on file  . Food insecurity - inability: Not on file  . Transportation needs - medical: Not on file  . Transportation needs - non-medical: Not on file  Occupational History  . Occupation: textile  Tobacco Use  . Smoking status: Never Smoker  . Smokeless tobacco: Never Used  Substance and Sexual Activity  . Alcohol use: Yes    Alcohol/week: 0.0 oz    Comment: occ. wine  . Drug use: No  . Sexual activity: Yes    Partners: Male    Birth control/protection: None, Surgical  Other Topics Concern  . Not on file  Social History Narrative  . Not on file     Current Outpatient Medications:  .  fluticasone (FLONASE) 50 MCG/ACT nasal spray, Place 2 sprays into both nostrils daily., Disp: 16 g, Rfl: 2 .  albuterol (PROVENTIL HFA) 108 (90 Base) MCG/ACT inhaler, Inhale 2 puffs into the lungs every 6 (six) hours as needed for wheezing or shortness of breath., Disp: 1 Inhaler, Rfl: 1 .  ALPRAZolam (XANAX) 0.5 MG tablet, Take 1 tablet (0.5 mg total) by mouth 2 (two) times daily as needed for anxiety., Disp: 25 tablet, Rfl: 0 .  DEXILANT 60 MG capsule, Take 1 capsule (60 mg total) by mouth daily., Disp: 30 capsule, Rfl: 5 .  doxycycline (VIBRA-TABS) 100 MG tablet, Take 1 tablet (100 mg total) by mouth 2 (two) times daily., Disp: 20 tablet, Rfl:  0 .  Fluticasone-Salmeterol (ADVAIR) 250-50 MCG/DOSE AEPB, Inhale 2 puffs into the lungs 2 (two) times daily., Disp: 60 each, Rfl: 5 .  ibuprofen (ADVIL,MOTRIN) 800 MG tablet, Take 1 tablet (800 mg total) every 8 (eight) hours as needed by mouth., Disp: 30 tablet, Rfl: 1 .  ketotifen (ZADITOR) 0.025 % ophthalmic solution, Place 1 drop into the right eye daily., Disp: , Rfl:  .  MULTIPLE VITAMIN PO, Take 1 tablet by mouth daily., Disp: , Rfl:  .  prednisoLONE acetate (PRED FORTE) 1 % ophthalmic suspension, Place 1 drop into the right eye daily. , Disp: , Rfl:  .  promethazine (PHENERGAN) 12.5 MG tablet, Take 1 tablet (12.5 mg total) by mouth every 8 (eight) hours as needed for nausea or vomiting., Disp: 20 tablet, Rfl: 0 .  ranitidine (ZANTAC) 300 MG capsule, Take 1 capsule (300  mg total) by mouth every evening., Disp: 30 capsule, Rfl: 0 .  rizatriptan (MAXALT) 10 MG tablet, Take 1 tablet (10 mg total) by mouth 2 (two) times daily as needed., Disp: 10 tablet, Rfl: 2 .  STOOL SOFTENER 100 MG capsule, , Disp: , Rfl: 0 .  Travoprost, BAK Free, (TRAVATAN) 0.004 % SOLN ophthalmic solution, Place 1 drop into the right eye at bedtime., Disp: , Rfl:  .  triamcinolone cream (KENALOG) 0.1 %, Apply 1 application topically 2 (two) times daily as needed., Disp: 30 g, Rfl: 0 .  valACYclovir (VALTREX) 500 MG tablet, Take 1 tablet by mouth 2 (two) times daily., Disp: , Rfl: 0  Allergies  Allergen Reactions  . Penicillins Nausea Only     ROS  Constitutional: Negative for fever or weight change.  Respiratory: Negative for cough and shortness of breath.   Cardiovascular: Negative for chest pain or palpitations.  Gastrointestinal: Negative for abdominal pain, no bowel changes.  Musculoskeletal: Negative for gait problem or joint swelling.  Skin: Negative for rash.  Neurological: Negative for dizziness or headache.  No other specific complaints in a complete review of systems (except as listed in HPI  above).  Objective  Vitals:   11/22/17 1500  BP: 96/66  Pulse: 73  Resp: 14  SpO2: 96%  Weight: 125 lb 12.8 oz (57.1 kg)  Height: 5\' 5"  (1.651 m)    Body mass index is 20.93 kg/m.  Physical Exam  Constitutional: Patient appears well-developed and well-nourished. No distress.  HEENT: head atraumatic, normocephalic, pupils equal and reactive to light, ears normal  neck supple, throat within normal limits, tender right side lymphadenopathy Cardiovascular: Normal rate, regular rhythm and normal heart sounds.  No murmur heard. No BLE edema. Pulmonary/Chest: Effort normal and breath sounds normal. No respiratory distress. Abdominal: Soft.  There is no tenderness. Psychiatric: Patient has a normal mood and affect. behavior is normal. Judgment and thought content normal.    PHQ2/9: Depression screen [facility] 2/9 08/22/2017 01/24/2017 09/02/2016 06/20/2016 03/17/2016  Decreased Interest 0 0 0 0 0  Down, Depressed, Hopeless 0 0 0 0 0  PHQ - 2 Score 0 0 0 0 0  Altered sleeping - - - - -  Tired, decreased energy - - - - -  Change in appetite - - - - -  Feeling bad or failure about yourself  - - - - -  Trouble concentrating - - - - -  Moving slowly or fidgety/restless - - - - -  Suicidal thoughts - - - - -  PHQ-9 Score - - - - -  Difficult doing work/chores - - - - -     Fall Risk: Fall Risk  11/22/2017 08/22/2017 01/24/2017 09/02/2016 06/20/2016  Falls in the past year? No No No No No  Risk for fall due to : - - - - -     Functional Status Survey: Is the patient deaf or have difficulty hearing?: No Does the patient have difficulty seeing, even when wearing glasses/contacts?: No Does the patient have difficulty concentrating, remembering, or making decisions?: No Does the patient have difficulty walking or climbing stairs?: No Does the patient have difficulty dressing or bathing?: No Does the patient have difficulty doing errands alone such as visiting a doctor's office or shopping?:  No   Assessment & Plan  1. Moderate persistent asthma without complication  - Fluticasone-Salmeterol (ADVAIR) 250-50 MCG/DOSE AEPB; Inhale 2 puffs into the lungs 2 (two) times daily.  Dispense: 60 each; Refill:  5  2. Gastroesophageal reflux disease without esophagitis  - DEXILANT 60 MG capsule; Take 1 capsule (60 mg total) by mouth daily.  Dispense: 30 capsule; Refill: 5  3. Migraine without aura and without status migrainosus, not intractable  - rizatriptan (MAXALT) 10 MG tablet; Take 1 tablet (10 mg total) by mouth 2 (two) times daily as needed.  Dispense: 10 tablet; Refill: 2  4. Exposure to strep throat  - doxycycline (VIBRA-TABS) 100 MG tablet; Take 1 tablet (100 mg total) by mouth 2 (two) times daily.  Dispense: 20 tablet; Refill: 0  5. Lymphadenopathy of right cervical region  - doxycycline (VIBRA-TABS) 100 MG tablet; Take 1 tablet (100 mg total) by mouth 2 (two) times daily.  Dispense: 20 tablet; Refill: 0  6. Seasonal allergic rhinitis, unspecified trigger  - fluticasone (FLONASE) 50 MCG/ACT nasal spray; Place 2 sprays into both nostrils daily.  Dispense: 16 g; Refill: 2  7. B12 deficiency  She will resume B12 otc supplementation

## 2017-12-13 ENCOUNTER — Telehealth: Payer: Self-pay | Admitting: Family Medicine

## 2017-12-13 DIAGNOSIS — R590 Localized enlarged lymph nodes: Secondary | ICD-10-CM

## 2017-12-13 NOTE — Telephone Encounter (Signed)
Copied from [place] [id number]. Topic: Referral - Request >> Dec 13, 2017 11:20 AM Hewitt Shorts wrote: Reason for CRM: pt is calling to let Dr. Ancil Boozer know that the knot under her neck is not any smaller and would like to go ahead with the referral  to a ENT office  best number to call is [phone]

## 2017-12-13 NOTE — Telephone Encounter (Signed)
Copied from [place] [id number]. Topic: Quick Communication - See Telephone Encounter >> Dec 13, 2017 11:24 AM Hewitt Shorts wrote: CRM for notification. See Telephone encounter for: patient is calling to let dr Ancil Boozer know that the knot is not any smaller and would like to go ahead with the referral Best number [phone]  12/13/17.

## 2017-12-26 ENCOUNTER — Ambulatory Visit: Payer: 59 | Admitting: Gastroenterology

## 2018-01-10 ENCOUNTER — Other Ambulatory Visit: Payer: Self-pay | Admitting: Family Medicine

## 2018-01-10 DIAGNOSIS — K5909 Other constipation: Secondary | ICD-10-CM

## 2018-01-10 NOTE — Telephone Encounter (Signed)
Refill request for general medication: Linzess 72 mcg  Last office visit: 11/22/2017  Last physical exam: 08/17/2016  Follow-up on file. 05/23/2018

## 2018-01-11 NOTE — Telephone Encounter (Signed)
Patient continues to take medication daily. 72 mcg daily before breakfast.

## 2018-01-11 NOTE — Telephone Encounter (Signed)
She was seen by Dr. Vicente Males and is no longer on her active list, please verify she is taking it and the current dose.

## 2018-01-30 ENCOUNTER — Telehealth: Payer: Self-pay | Admitting: Obstetrics and Gynecology

## 2018-01-30 DIAGNOSIS — R590 Localized enlarged lymph nodes: Secondary | ICD-10-CM

## 2018-01-30 MED ORDER — FLUCONAZOLE 150 MG PO TABS: 150.0000 mg | ORAL_TABLET | Freq: Once | ORAL | 1 refills | Status: AC

## 2018-01-30 NOTE — Telephone Encounter (Signed)
The patient called and stated that she would like to speak with Joyice Faster in regards to her possibly needing a refill for a possible yeast infection. Please advise.

## 2018-01-30 NOTE — Telephone Encounter (Signed)
Pt states she is having a cottage cheese like d/c. X 2 days. Painful IC. Pos itching. No new sex partners. Did recently complete a atb. Erx Diflucan with 1rf. If sx after 1 week she can repeat. If no better in 14 days to make an appt to be seen.

## 2018-01-30 NOTE — Telephone Encounter (Signed)
LMTRC

## 2018-02-15 ENCOUNTER — Telehealth: Payer: Self-pay | Admitting: Family Medicine

## 2018-02-15 NOTE — Telephone Encounter (Signed)
Copied from [place] [id number]. Topic: General - Other >> Feb 15, 2018 10:08 AM Yvette Rack wrote: Reason for CRM: patient calling stating that she had received a letter in the mail stating that her insurance want pay for Fluticasone-Salmeterol (ADVAIR) 250-50 MCG/DOSE AEPB unless its the generic brand  Pt has been out of this medicine for 4 days now

## 2018-02-15 NOTE — Telephone Encounter (Signed)
I do not know of a generic Advair, can you call pharmacy and verify? Okay to change if dose per dose / same product

## 2018-02-15 NOTE — Telephone Encounter (Signed)
Received from [facility] this Inhalers requires a PA.

## 2018-02-16 ENCOUNTER — Other Ambulatory Visit: Payer: Self-pay | Admitting: Family Medicine

## 2018-02-16 MED ORDER — FLUTICASONE FUROATE-VILANTEROL 100-25 MCG/INH IN AEPB: 1.0000 | INHALATION_SPRAY | Freq: Every day | RESPIRATORY_TRACT | 2 refills | Status: DC

## 2018-02-16 NOTE — Telephone Encounter (Signed)
[name] spoke with the drug rep and found that there are no more Advair samples since it now has a generic. Dr. Ancil Boozer then stated that this patient can take Memory Dance until they get everything taken care of regarding the Advair.  I called this patient to inform her of the situation and to let her know that we had a Breo coupon and a sample waiting for her at the front, but she informed me that she was out of town and will not be back until Sunday.  I asked her if she wanted me to see if Dr. Ancil Boozer could send her in a RX to wherever she was at and she told me no and that she still had her rescue inhaler with her.  I said that will be fine and that we will keep it upfront for her to pick up on Monday. She said ok.

## 2018-02-16 NOTE — Telephone Encounter (Signed)
N/Aon March 6  This medication or product was previously approved on [id number] FROM 2018-02-07 TO 2019-02-07. You will be able to fill a prescription for this medication at your pharmacy. If your pharmacy has questions regarding the processing of your prescription, please have them call the [facility] pharmacy help desk at ([phone].   I contacted the pharmacy and spoke with Legrand Como. He told me that all that it has been approved it keeps rejecting so I gave him the number to [facility] . He said he will call and then call me back.

## 2018-02-16 NOTE — Telephone Encounter (Signed)
Jessica Carroll contacted me back and informed me that he was told it needed another PA since it was a combination inhaler. He stated that he has never heard of that before and was given the number [phone] for our office to contact.  I then called them and spoke with Vickie. She told me that after she did some researching it was not our fault but theirs. [facility] was showing an error with Advair so she had to send this case to their resolution team which could take about 24-72 hours. I informed her that we close at 5pm today and will not reopen until 7am Monday morning. She stated that she put it with the case information and apologized for the inconvenience.  I will see if [name] can contact the drug rep to get a few samples.

## 2018-02-16 NOTE — Progress Notes (Signed)
Sent Breo to pharmacy also  It is once a day not twice daily. Please give her a voucher

## 2018-02-19 ENCOUNTER — Other Ambulatory Visit: Payer: Self-pay | Admitting: Family Medicine

## 2018-03-28 ENCOUNTER — Ambulatory Visit: Payer: BLUE CROSS/BLUE SHIELD | Admitting: Obstetrics and Gynecology

## 2018-03-28 ENCOUNTER — Encounter: Payer: Self-pay | Admitting: Obstetrics and Gynecology

## 2018-03-28 VITALS — BP 96/59 | HR 69 | Ht 65.0 in | Wt 121.7 lb

## 2018-03-28 DIAGNOSIS — B373 Candidiasis of vulva and vagina: Secondary | ICD-10-CM | POA: Diagnosis not present

## 2018-03-28 DIAGNOSIS — N941 Unspecified dyspareunia: Secondary | ICD-10-CM | POA: Diagnosis not present

## 2018-03-28 DIAGNOSIS — B3731 Acute candidiasis of vulva and vagina: Secondary | ICD-10-CM

## 2018-03-28 MED ORDER — FLUCONAZOLE 150 MG PO TABS: 150.0000 mg | ORAL_TABLET | ORAL | 3 refills | Status: DC

## 2018-03-28 NOTE — Progress Notes (Signed)
Chief complaint: 1.  Vaginal discharge 2.  Vaginal itching and burning  Patient presents for 2-week history of vaginal itching, vaginal discharge, and painful intercourse.  She has not had any recent antibiotic therapy.  She has not had any change in medications recently.  She does not have a chronic issue with sugar control.  She has intermittently gotten yeast infections in the past. Patient is also having significant discomfort with intercourse with deep thrusting.  Dryness is an issue.  Past Medical History:  Diagnosis Date  . Anxiety   . Asthma 2011  . Depression   . Diffuse cystic mastopathy   . Endometriosis   . GERD (gastroesophageal reflux disease)   . Headache     Past Surgical History:  Procedure Laterality Date  . COLONOSCOPY WITH PROPOFOL N/A 09/29/2017   Procedure: COLONOSCOPY WITH PROPOFOL;  Surgeon: Jonathon Bellows, MD;  Location: [facility] ENDOSCOPY;  Service: Gastroenterology;  Laterality: N/A;  . CORNEAL TRANSPLANT Right 01/07/2013  . ESOPHAGOGASTRODUODENOSCOPY (EGD) WITH PROPOFOL N/A 09/29/2017   Procedure: ESOPHAGOGASTRODUODENOSCOPY (EGD) WITH PROPOFOL;  Surgeon: Jonathon Bellows, MD;  Location: [facility] ENDOSCOPY;  Service: Gastroenterology;  Laterality: N/A;  . EYE SURGERY    . eyelid surgery Right June 19, 2015   [facility] by Dr. Norlene Duel  . LAPAROSCOPIC BILATERAL SALPINGECTOMY N/A 01/25/2016   Procedure: LAPAROSCOPIC BILATERAL SALPINGECTOMY, ADHESIOLYSIS, LAPAROSCOPIC EXCISION/FULGERATION ENDOMETRIOSIS ;  Surgeon: [name], MD;  Location: [facility] ORS;  Service: Gynecology;  Laterality: N/A;  . Laparoscopic excision and fulguration of endometriosis N/A    bladder flap and uterosacral ligament endometriosis, excised and cauterized  . UPPER GI ENDOSCOPY  07/19/2012  . VAGINAL HYSTERECTOMY N/A 07/31/2017   Procedure: HYSTERECTOMY VAGINAL;  Surgeon: [name], MD;  Location: [facility] ORS;  Service: Gynecology;  Laterality: N/A;    Review of  systems-comprehensive review of systems is negative except for that noted in the HPI OBJECTIVE: BP (!) 96/59   Pulse 69   Ht 5\' 5"  (1.651 m)   Wt 121 lb 11.2 oz (55.2 kg)   LMP 07/11/2017 (Exact Date)   BMI 20.25 kg/m  Pleasant female no acute distress.  Alert and oriented. Abdomen: Soft, nontender Pelvic exam: External genitalia-normal without erythema or rash or satellite lesions BUS-normal Vagina-fair estrogen effect; minimal white secretions in the vaginal vault Cervix-surgically absent Uterus-surgically absent Bimanual-deferred Rectovaginal-normal external exam  PROCEDURE: Wet prep Normal saline-negative for Trichomonas, clue cells, or significant white blood cells KOH-inconclusive  ASSESSMENT: 1.  Suspected monilia vaginitis 2.  Dyspareunia  PLAN: 1.  Diflucan 150 mg orally every 3 days x3 doses 2.  Vaginal lubricants include:  Virgin olive oil  Coconut oil  Astroglide  Jo H2O lubricant 3.  If symptoms are not resolved within 2 weeks, return for reevaluation and possible culture  A total of 15 minutes were spent face-to-face with the patient during this encounter and over half of that time dealt with counseling and coordination of care.  [name], MD  Note: This dictation was prepared with Dragon dictation along with smaller phrase technology. Any transcriptional errors that result from this process are unintentional.

## 2018-03-28 NOTE — Patient Instructions (Signed)
1.  Diflucan 150 mg every 3 days x3 doses 2.  Over-the-counter lubricants to be considered for intercourse include:  Virgin olive oil  Coconut oil  Astroglide  Jo H2O lubricant

## 2018-04-10 ENCOUNTER — Telehealth: Payer: Self-pay | Admitting: Family Medicine

## 2018-04-10 NOTE — Telephone Encounter (Signed)
Prior Auth will be done via CoverMyMeds

## 2018-04-10 NOTE — Telephone Encounter (Signed)
Copied from [place] [id number]. Topic: Quick Communication - See Telephone Encounter >> Apr 10, 2018  9:42 AM Cleaster Corin, NT wrote: CRM for notification. See Telephone encounter for: 04/10/18.  Pt. Calling stating [employer] is needing pre authorization for med. DEXILANT 60 MG capsule [[id number]]

## 2018-04-30 ENCOUNTER — Other Ambulatory Visit: Payer: Self-pay

## 2018-04-30 DIAGNOSIS — R932 Abnormal findings on diagnostic imaging of liver and biliary tract: Secondary | ICD-10-CM

## 2018-04-30 NOTE — Progress Notes (Addendum)
Contacted patient concerning repeat US for hepatic cysts per Dr. Vicente Males (x6 month).  Placed orders. Provided phone number to [employer].

## 2018-05-11 ENCOUNTER — Ambulatory Visit: Payer: 59

## 2018-05-16 ENCOUNTER — Other Ambulatory Visit: Payer: Self-pay | Admitting: Family Medicine

## 2018-05-16 NOTE — Telephone Encounter (Signed)
Refill request for general medication. Breo  Last office visit : 11/22/2017   Follow up on 05/30/18

## 2018-05-23 ENCOUNTER — Ambulatory Visit: Payer: 59 | Admitting: Family Medicine

## 2018-05-30 ENCOUNTER — Ambulatory Visit: Payer: 59 | Admitting: Family Medicine

## 2018-06-12 DIAGNOSIS — H16401 Unspecified corneal neovascularization, right eye: Secondary | ICD-10-CM | POA: Diagnosis not present

## 2018-06-12 DIAGNOSIS — Z947 Corneal transplant status: Secondary | ICD-10-CM | POA: Diagnosis not present

## 2018-06-15 ENCOUNTER — Ambulatory Visit: Payer: BLUE CROSS/BLUE SHIELD

## 2018-06-18 ENCOUNTER — Other Ambulatory Visit: Payer: Self-pay | Admitting: Family Medicine

## 2018-07-10 ENCOUNTER — Ambulatory Visit: Payer: BLUE CROSS/BLUE SHIELD | Admitting: Family Medicine

## 2018-07-10 ENCOUNTER — Encounter: Payer: Self-pay | Admitting: Family Medicine

## 2018-07-10 VITALS — BP 116/68 | HR 82 | Temp 97.7°F | Resp 16 | Ht 65.0 in | Wt 118.7 lb

## 2018-07-10 DIAGNOSIS — F419 Anxiety disorder, unspecified: Secondary | ICD-10-CM

## 2018-07-10 DIAGNOSIS — E538 Deficiency of other specified B group vitamins: Secondary | ICD-10-CM

## 2018-07-10 DIAGNOSIS — K219 Gastro-esophageal reflux disease without esophagitis: Secondary | ICD-10-CM

## 2018-07-10 DIAGNOSIS — J4540 Moderate persistent asthma, uncomplicated: Secondary | ICD-10-CM

## 2018-07-10 DIAGNOSIS — G43009 Migraine without aura, not intractable, without status migrainosus: Secondary | ICD-10-CM | POA: Diagnosis not present

## 2018-07-10 DIAGNOSIS — Z1231 Encounter for screening mammogram for malignant neoplasm of breast: Secondary | ICD-10-CM

## 2018-07-10 DIAGNOSIS — R110 Nausea: Secondary | ICD-10-CM

## 2018-07-10 MED ORDER — ALPRAZOLAM 0.5 MG PO TABS: 0.5000 mg | ORAL_TABLET | Freq: Two times a day (BID) | ORAL | 0 refills | Status: DC | PRN

## 2018-07-10 MED ORDER — ALBUTEROL SULFATE HFA 108 (90 BASE) MCG/ACT IN AERS: 2.0000 | INHALATION_SPRAY | Freq: Four times a day (QID) | RESPIRATORY_TRACT | 1 refills | Status: DC | PRN

## 2018-07-10 MED ORDER — DEXILANT 60 MG PO CPDR: 60.0000 mg | DELAYED_RELEASE_CAPSULE | Freq: Every day | ORAL | 5 refills | Status: DC

## 2018-07-10 MED ORDER — PROMETHAZINE HCL 12.5 MG PO TABS: 12.5000 mg | ORAL_TABLET | Freq: Three times a day (TID) | ORAL | 0 refills | Status: DC | PRN

## 2018-07-10 NOTE — Progress Notes (Signed)
Name: Jessica Carroll   MRN: [id number]    DOB: [birth year]   Date:07/10/2018       Progress Note  Subjective  Chief Complaint  Chief Complaint  Patient presents with  . Medication Refill    6 month F/U  . Asthma    Breo has been helping symptoms  . Gastroesophageal Reflux    Has been "ok"  . Migraine  . Anxiety  . Constipation    Linzess helps her go daily  . Abdominal Pain    After eating a few bites she feels bloated and unable to keep eating-hast lost 4 pounds since last visit    HPI  Asthma: she is now on Breo. She has SOB with moderate activity. She has nocturnal cough a couple of times a week  but no wheezing, she needs a refill of ventolin.   GERD/dysphagia:seen by Dr. Vicente Males, had EGD and colonoscopy showed hemorrhoids but normal EGD. She is on Dexilant, lost 4 lbs since last visit. Gallbladder US showed one cyst, normal HiDA scan, she continues to have some bloating after meals. No blood in stools. Constipation is controlled with Linzess   Migraine:  no recent episodes Headache is described as throbbing, usually frontal or nuchal, associated with photophobia, and phonophobia. She has nausea, no vomiting with episodes.Taking medication prn only   Chronic constipation: she states she has daily bowel movement with medication, no straining or blood in stools. [place] # 3 or 4   Anxiety: she always worrying about something. She is currently very worried about losing her job, her company had a large lay off recently. Also worries about her step-son that is in a psychiatric facility and will need to go to detox facility. He was diagnosed with schizophrenia.    Patient Active Problem List   Diagnosis Date Noted  . Type A blood, Rh positive 08/22/2017  . S/P vaginal hysterectomy 07/31/2017  . Recurrent vaginitis 01/10/2017  . Hemorrhoids, external without complications 40/98/1191  . Nausea in adult patient 02/24/2016  . Chronic constipation 08/14/2015  . Monilial  vaginitis 08/14/2015  . Bilateral ganglion cysts of wrists 08/14/2015  . Mild mitral regurgitation 05/19/2015  . B12 deficiency 05/18/2015  . Asthma, moderate 05/18/2015  . Anxiety 05/18/2015  . Insomnia, persistent 05/18/2015  . Eczema of hand 05/18/2015  . Viral keratitis 05/18/2015  . Migraine without aura and without status migrainosus, not intractable 05/18/2015  . Numerous moles 05/18/2015  . Allergic rhinitis 05/18/2015  . History of corneal transplant 05/18/2015  . Gastroesophageal reflux disease without esophagitis 12/16/2009    Past Surgical History:  Procedure Laterality Date  . COLONOSCOPY WITH PROPOFOL N/A 09/29/2017   Procedure: COLONOSCOPY WITH PROPOFOL;  Surgeon: Jonathon Bellows, MD;  Location: [facility] ENDOSCOPY;  Service: Gastroenterology;  Laterality: N/A;  . CORNEAL TRANSPLANT Right 01/07/2013  . ESOPHAGOGASTRODUODENOSCOPY (EGD) WITH PROPOFOL N/A 09/29/2017   Procedure: ESOPHAGOGASTRODUODENOSCOPY (EGD) WITH PROPOFOL;  Surgeon: Jonathon Bellows, MD;  Location: [facility] ENDOSCOPY;  Service: Gastroenterology;  Laterality: N/A;  . EYE SURGERY    . eyelid surgery Right June 19, 2015   [facility] by Dr. Norlene Duel  . LAPAROSCOPIC BILATERAL SALPINGECTOMY N/A 01/25/2016   Procedure: LAPAROSCOPIC BILATERAL SALPINGECTOMY, ADHESIOLYSIS, LAPAROSCOPIC EXCISION/FULGERATION ENDOMETRIOSIS ;  Surgeon: [name], MD;  Location: [facility] ORS;  Service: Gynecology;  Laterality: N/A;  . Laparoscopic excision and fulguration of endometriosis N/A    bladder flap and uterosacral ligament endometriosis, excised and cauterized  . UPPER GI ENDOSCOPY  07/19/2012  . VAGINAL HYSTERECTOMY  N/A 07/31/2017   Procedure: HYSTERECTOMY VAGINAL;  Surgeon: [name], MD;  Location: [facility] ORS;  Service: Gynecology;  Laterality: N/A;    Family History  Adopted: Yes  Problem Relation Age of Onset  . Hyperlipidemia Mother   . Hypertension Mother   . Diabetes Father   . Heart failure Father 22        hear attack  . Cancer Neg Hx   . Breast cancer Neg Hx     Social History   Socioeconomic History  . Marital status: Single    Spouse name: Not on file  . Number of children: Not on file  . Years of education: 11  . Highest education level: Not on file  Occupational History  . Occupation: Education officer, community  . Financial resource strain: Not on file  . Food insecurity:    Worry: Not on file    Inability: Not on file  . Transportation needs:    Medical: Not on file    Non-medical: Not on file  Tobacco Use  . Smoking status: Never Smoker  . Smokeless tobacco: Never Used  Substance and Sexual Activity  . Alcohol use: Yes    Alcohol/week: 0.0 oz    Comment: occ. wine  . Drug use: No  . Sexual activity: Yes    Partners: Male    Birth control/protection: None, Surgical  Lifestyle  . Physical activity:    Days per week: Not on file    Minutes per session: Not on file  . Stress: Not on file  Relationships  . Social connections:    Talks on phone: Not on file    Gets together: Not on file    Attends religious service: Not on file    Active member of club or organization: Not on file    Attends meetings of clubs or organizations: Not on file    Relationship status: Not on file  . Intimate partner violence:    Fear of current or ex partner: Not on file    Emotionally abused: Not on file    Physically abused: Not on file    Forced sexual activity: Not on file  Other Topics Concern  . Not on file  Social History Narrative  . Not on file     Current Outpatient Medications:  .  albuterol (PROVENTIL HFA) 108 (90 Base) MCG/ACT inhaler, Inhale 2 puffs into the lungs every 6 (six) hours as needed for wheezing or shortness of breath., Disp: 1 Inhaler, Rfl: 1 .  ALPRAZolam (XANAX) 0.5 MG tablet, Take 1 tablet (0.5 mg total) by mouth 2 (two) times daily as needed for anxiety., Disp: 25 tablet, Rfl: 0 .  BREO ELLIPTA 100-25 MCG/INH AEPB, INHALE 1 PUFF INTO LUNGS EVERY DAY,  Disp: 60 each, Rfl: 5 .  DEXILANT 60 MG capsule, Take 1 capsule (60 mg total) by mouth daily., Disp: 30 capsule, Rfl: 5 .  fluconazole (DIFLUCAN) 150 MG tablet, Take 1 tablet (150 mg total) by mouth every 3 (three) days. For three doses, Disp: 3 tablet, Rfl: 3 .  ibuprofen (ADVIL,MOTRIN) 800 MG tablet, Take 1 tablet (800 mg total) every 8 (eight) hours as needed by mouth., Disp: 30 tablet, Rfl: 1 .  linaclotide (LINZESS) 72 MCG capsule, Take 1 capsule (72 mcg total) by mouth daily before breakfast., Disp: 30 capsule, Rfl: 5 .  MULTIPLE VITAMIN PO, Take 1 tablet by mouth daily., Disp: , Rfl:  .  prednisoLONE acetate (PRED FORTE) 1 % ophthalmic  suspension, Place 1 drop into the right eye daily. , Disp: , Rfl:  .  promethazine (PHENERGAN) 12.5 MG tablet, Take 1 tablet (12.5 mg total) by mouth every 8 (eight) hours as needed for nausea or vomiting., Disp: 20 tablet, Rfl: 0 .  rizatriptan (MAXALT) 10 MG tablet, Take 1 tablet (10 mg total) by mouth 2 (two) times daily as needed., Disp: 10 tablet, Rfl: 2 .  triamcinolone cream (KENALOG) 0.1 %, Apply 1 application topically 2 (two) times daily as needed., Disp: 30 g, Rfl: 0 .  valACYclovir (VALTREX) 500 MG tablet, Take 1 tablet by mouth 2 (two) times daily., Disp: , Rfl: 0 .  fluticasone (FLONASE) 50 MCG/ACT nasal spray, Place 2 sprays into both nostrils daily. (Patient not taking: Reported on 07/10/2018), Disp: 16 g, Rfl: 2 .  ketotifen (ZADITOR) 0.025 % ophthalmic solution, Place 1 drop into the right eye daily., Disp: , Rfl:  .  STOOL SOFTENER 100 MG capsule, , Disp: , Rfl: 0 .  Travoprost, BAK Free, (TRAVATAN) 0.004 % SOLN ophthalmic solution, Place 1 drop into the right eye at bedtime., Disp: , Rfl:   Allergies  Allergen Reactions  . Penicillins Nausea Only     ROS  Constitutional: Negative for fever or weight change.  Respiratory: positive  for cough and shortness of breath.   Cardiovascular: Negative for chest pain or palpitations.   Gastrointestinal: Negative for abdominal pain, no bowel changes.  Musculoskeletal: Negative for gait problem or joint swelling.  Skin: Negative for rash.  Neurological: Negative for dizziness or headache.  No other specific complaints in a complete review of systems (except as listed in HPI above).  Objective  Vitals:   07/10/18 1438  BP: 116/68  Pulse: 82  Resp: 16  Temp: 97.7 F (36.5 C)  TempSrc: Oral  SpO2: 97%  Weight: 118 lb 11.2 oz (53.8 kg)  Height: 5\' 5"  (1.651 m)    Body mass index is 19.75 kg/m.  Physical Exam  Constitutional: Patient appears well-developed and well-nourished.  No distress.  HEENT: head atraumatic, normocephalic, pupils equal and reactive to light,  neck supple, throat within normal limits Cardiovascular: Normal rate, regular rhythm and normal heart sounds.  No murmur heard. No BLE edema. Pulmonary/Chest: Effort normal and breath sounds normal. No respiratory distress. Abdominal: Soft.  There is no tenderness. Psychiatric: Patient has a normal mood and affect. behavior is normal. Judgment and thought content normal.   PHQ2/9: Depression screen [facility] 2/9 07/10/2018 08/22/2017 01/24/2017 09/02/2016 06/20/2016  Decreased Interest 1 0 0 0 0  Down, Depressed, Hopeless 1 0 0 0 0  PHQ - 2 Score 2 0 0 0 0  Altered sleeping 3 - - - -  Tired, decreased energy 3 - - - -  Change in appetite 3 - - - -  Feeling bad or failure about yourself  0 - - - -  Trouble concentrating 0 - - - -  Moving slowly or fidgety/restless 0 - - - -  Suicidal thoughts 0 - - - -  PHQ-9 Score 11 - - - -  Difficult doing work/chores Somewhat difficult - - - -     Fall Risk: Fall Risk  07/10/2018 11/22/2017 08/22/2017 01/24/2017 09/02/2016  Falls in the past year? No No No No No  Risk for fall due to : - - - - -      Assessment & Plan  1. Moderate persistent asthma without complication  - albuterol (PROVENTIL HFA) 108 (90 Base) MCG/ACT inhaler;  Inhale 2 puffs into the lungs  every 6 (six) hours as needed for wheezing or shortness of breath.  Dispense: 1 Inhaler; Refill: 1  2. Anxiety  - ALPRAZolam (XANAX) 0.5 MG tablet; Take 1 tablet (0.5 mg total) by mouth 2 (two) times daily as needed for anxiety.  Dispense: 20 tablet; Refill: 0  3. Gastroesophageal reflux disease without esophagitis  - DEXILANT 60 MG capsule; Take 1 capsule (60 mg total) by mouth daily.  Dispense: 30 capsule; Refill: 5  4. Migraine without aura and without status migrainosus, not intractable  Stable   5. B12 deficiency  Taking otc supplements   6. Breast cancer screening by mammogram  - MM DIGITAL SCREENING BILATERAL; Future  7. Nausea  - promethazine (PHENERGAN) 12.5 MG tablet; Take 1 tablet (12.5 mg total) by mouth every 8 (eight) hours as needed for nausea or vomiting.  Dispense: 20 tablet; Refill: 0

## 2018-08-02 ENCOUNTER — Encounter: Payer: Self-pay | Admitting: Family Medicine

## 2018-08-02 ENCOUNTER — Encounter: Payer: Self-pay | Admitting: Emergency Medicine

## 2018-08-02 ENCOUNTER — Other Ambulatory Visit (HOSPITAL_COMMUNITY)
Admission: RE | Admit: 2018-08-02 | Discharge: 2018-08-02 | Disposition: A | Discharge status: Home / Self Care | Payer: BLUE CROSS/BLUE SHIELD | Source: Ambulatory Visit | Attending: Family Medicine | Admitting: Family Medicine

## 2018-08-02 ENCOUNTER — Ambulatory Visit: Payer: BLUE CROSS/BLUE SHIELD | Admitting: Family Medicine

## 2018-08-02 VITALS — BP 120/70 | HR 64 | Temp 97.6°F | Resp 14 | Ht 65.0 in | Wt 122.6 lb

## 2018-08-02 DIAGNOSIS — N3001 Acute cystitis with hematuria: Secondary | ICD-10-CM

## 2018-08-02 DIAGNOSIS — N898 Other specified noninflammatory disorders of vagina: Secondary | ICD-10-CM | POA: Insufficient documentation

## 2018-08-02 DIAGNOSIS — B3731 Acute candidiasis of vulva and vagina: Secondary | ICD-10-CM

## 2018-08-02 DIAGNOSIS — B373 Candidiasis of vulva and vagina: Secondary | ICD-10-CM | POA: Insufficient documentation

## 2018-08-02 LAB — POCT URINALYSIS DIPSTICK
BILIRUBIN UA: NEGATIVE
GLUCOSE UA: NEGATIVE
KETONES UA: NEGATIVE
Nitrite, UA: NEGATIVE
Protein, UA: NEGATIVE
SPEC GRAV UA: 1.01 (ref 1.010–1.025)
Urobilinogen, UA: NEGATIVE E.U./dL — AB
pH, UA: 6.5 (ref 5.0–8.0)

## 2018-08-02 MED ORDER — SULFAMETHOXAZOLE-TRIMETHOPRIM 800-160 MG PO TABS: 1.0000 | ORAL_TABLET | Freq: Two times a day (BID) | ORAL | 0 refills | Status: AC

## 2018-08-02 MED ORDER — FLUCONAZOLE 150 MG PO TABS: 150.0000 mg | ORAL_TABLET | Freq: Once | ORAL | 0 refills | Status: AC

## 2018-08-02 NOTE — Progress Notes (Signed)
Name: Jessica Carroll   MRN: [id number]    DOB: November 04, 1971   Date:08/02/2018       Progress Note  Subjective  Chief Complaint  Chief Complaint  Patient presents with  . Urinary Tract Infection    burning    HPI  Pt presents with concern for UTI - she noticed decreased urination yesterday morning but increased frequency/urgency. She also endorses some RLQ abdominal pain, nausea, she also notes vaginal itching (does have recurrent yeast infections).  She denies fevers/chills, back pain, or history of kidney stones, no vomiting.  Patient Active Problem List   Diagnosis Date Noted  . Type A blood, Rh positive 08/22/2017  . S/P vaginal hysterectomy 07/31/2017  . Recurrent vaginitis 01/10/2017  . Hemorrhoids, external without complications 30/08/2329  . Nausea in adult patient 02/24/2016  . Chronic constipation 08/14/2015  . Monilial vaginitis 08/14/2015  . Bilateral ganglion cysts of wrists 08/14/2015  . Mild mitral regurgitation 05/19/2015  . B12 deficiency 05/18/2015  . Asthma, moderate 05/18/2015  . Anxiety 05/18/2015  . Insomnia, persistent 05/18/2015  . Eczema of hand 05/18/2015  . Viral keratitis 05/18/2015  . Migraine without aura and without status migrainosus, not intractable 05/18/2015  . Numerous moles 05/18/2015  . Allergic rhinitis 05/18/2015  . History of corneal transplant 05/18/2015  . Gastroesophageal reflux disease without esophagitis 12/16/2009    Social History   Tobacco Use  . Smoking status: Never Smoker  . Smokeless tobacco: Never Used  Substance Use Topics  . Alcohol use: Yes    Alcohol/week: 0.0 standard drinks    Comment: occ. wine     Current Outpatient Medications:  .  albuterol (PROVENTIL HFA) 108 (90 Base) MCG/ACT inhaler, Inhale 2 puffs into the lungs every 6 (six) hours as needed for wheezing or shortness of breath., Disp: 1 Inhaler, Rfl: 1 .  ALPRAZolam (XANAX) 0.5 MG tablet, Take 1 tablet (0.5 mg total) by mouth 2 (two) times  daily as needed for anxiety., Disp: 20 tablet, Rfl: 0 .  BREO ELLIPTA 100-25 MCG/INH AEPB, INHALE 1 PUFF INTO LUNGS EVERY DAY, Disp: 60 each, Rfl: 5 .  DEXILANT 60 MG capsule, Take 1 capsule (60 mg total) by mouth daily., Disp: 30 capsule, Rfl: 5 .  fluconazole (DIFLUCAN) 150 MG tablet, Take 1 tablet (150 mg total) by mouth every 3 (three) days. For three doses, Disp: 3 tablet, Rfl: 3 .  ibuprofen (ADVIL,MOTRIN) 800 MG tablet, Take 1 tablet (800 mg total) every 8 (eight) hours as needed by mouth., Disp: 30 tablet, Rfl: 1 .  ketotifen (ZADITOR) 0.025 % ophthalmic solution, Place 1 drop into the right eye daily., Disp: , Rfl:  .  linaclotide (LINZESS) 72 MCG capsule, Take 1 capsule (72 mcg total) by mouth daily before breakfast., Disp: 30 capsule, Rfl: 5 .  MULTIPLE VITAMIN PO, Take 1 tablet by mouth daily., Disp: , Rfl:  .  prednisoLONE acetate (PRED FORTE) 1 % ophthalmic suspension, Place 1 drop into the right eye daily. , Disp: , Rfl:  .  promethazine (PHENERGAN) 12.5 MG tablet, Take 1 tablet (12.5 mg total) by mouth every 8 (eight) hours as needed for nausea or vomiting., Disp: 20 tablet, Rfl: 0 .  rizatriptan (MAXALT) 10 MG tablet, Take 1 tablet (10 mg total) by mouth 2 (two) times daily as needed., Disp: 10 tablet, Rfl: 2 .  STOOL SOFTENER 100 MG capsule, , Disp: , Rfl: 0 .  Travoprost, BAK Free, (TRAVATAN) 0.004 % SOLN ophthalmic solution, Place 1 drop  into the right eye at bedtime., Disp: , Rfl:  .  triamcinolone cream (KENALOG) 0.1 %, Apply 1 application topically 2 (two) times daily as needed., Disp: 30 g, Rfl: 0 .  valACYclovir (VALTREX) 500 MG tablet, Take 1 tablet by mouth 2 (two) times daily., Disp: , Rfl: 0 .  fluticasone (FLONASE) 50 MCG/ACT nasal spray, Place 2 sprays into both nostrils daily. (Patient not taking: Reported on 07/10/2018), Disp: 16 g, Rfl: 2  Allergies  Allergen Reactions  . Penicillins Nausea Only    ROS  Ten systems reviewed and is negative except as mentioned  in HPI  Objective  Vitals:   08/02/18 0854  BP: 120/70  Pulse: 64  Resp: 14  Temp: 97.6 F (36.4 C)  TempSrc: Oral  SpO2: 97%  Weight: 122 lb 9.6 oz (55.6 kg)  Height: 5\' 5"  (1.651 m)    Body mass index is 20.4 kg/m.  Nursing Note and Vital Signs reviewed.  Physical Exam  Constitutional: Patient appears well-developed and well-nourished.  No distress.  HEENT: head atraumatic, normocephalic Cardiovascular: Normal rate, regular rhythm and normal heart sounds.  No murmur heard. No BLE edema. Pulmonary/Chest: Effort normal and breath sounds normal. No respiratory distress. Abdominal: Soft.  There is no tenderness. No CVA tenderness. Psychiatric: Patient has a normal mood and affect. behavior is normal. Judgment and thought content normal.  Results for orders placed or performed in visit on 08/02/18 (from the past 72 hour(s))  POCT urinalysis dipstick     Status: Abnormal   Collection Time: 08/02/18  9:04 AM  Result Value Ref Range   Color, UA yellow    Clarity, UA clear    Glucose, UA Negative Negative   Bilirubin, UA negative    Ketones, UA negative    Spec Grav, UA 1.010 1.010 - 1.025   Blood, UA trace    pH, UA 6.5 5.0 - 8.0   Protein, UA Negative Negative   Urobilinogen, UA negative (A) 0.2 or 1.0 E.U./dL   Nitrite, UA negative    Leukocytes, UA Trace (A) Negative   Appearance clear    Odor none     Assessment & Plan  1. Acute cystitis with hematuria - Return in 2 weeks for UA recheck to ensure hematuria has resolved. - POCT urinalysis dipstick - sulfamethoxazole-trimethoprim (BACTRIM DS,SEPTRA DS) 800-160 MG tablet; Take 1 tablet by mouth 2 (two) times daily for 3 days.  Dispense: 6 tablet; Refill: 0 - Urine Culture  2. Vaginal itching - Cervicovaginal ancillary only - fluconazole (DIFLUCAN) 150 MG tablet; Take 1 tablet (150 mg total) by mouth once for 1 dose.  Dispense: 1 tablet; Refill: 0  3. Recurrent candidiasis of vagina - Cervicovaginal ancillary  only - fluconazole (DIFLUCAN) 150 MG tablet; Take 1 tablet (150 mg total) by mouth once for 1 dose.  Dispense: 1 tablet; Refill: 0  -Red flags and when to present for emergency care or RTC including fever >101.39F, chest pain, shortness of breath, new/worsening/un-resolving symptoms, flank pain/back pain, vomiting, frank hematuria reviewed with patient at time of visit. Follow up and care instructions discussed and provided in AVS.

## 2018-08-03 LAB — CERVICOVAGINAL ANCILLARY ONLY
BACTERIAL VAGINITIS: NEGATIVE
CHLAMYDIA, DNA PROBE: NEGATIVE
Candida vaginitis: NEGATIVE
NEISSERIA GONORRHEA: NEGATIVE
Trichomonas: NEGATIVE

## 2018-08-04 LAB — URINE CULTURE
MICRO NUMBER:: 91005846
SPECIMEN QUALITY:: ADEQUATE

## 2018-08-16 ENCOUNTER — Ambulatory Visit (INDEPENDENT_AMBULATORY_CARE_PROVIDER_SITE_OTHER): Payer: BLUE CROSS/BLUE SHIELD

## 2018-08-16 DIAGNOSIS — N3001 Acute cystitis with hematuria: Secondary | ICD-10-CM

## 2018-08-16 LAB — POCT URINALYSIS DIPSTICK
BILIRUBIN UA: NEGATIVE
GLUCOSE UA: NEGATIVE
KETONES UA: NEGATIVE
Leukocytes, UA: NEGATIVE
Nitrite, UA: NEGATIVE
PH UA: 6.5 (ref 5.0–8.0)
Protein, UA: NEGATIVE
Spec Grav, UA: 1.025 (ref 1.010–1.025)
UROBILINOGEN UA: 0.2 U/dL

## 2018-08-17 ENCOUNTER — Telehealth: Payer: Self-pay | Admitting: Family Medicine

## 2018-08-17 DIAGNOSIS — B0052 Herpesviral keratitis: Secondary | ICD-10-CM | POA: Diagnosis not present

## 2018-08-17 DIAGNOSIS — H16401 Unspecified corneal neovascularization, right eye: Secondary | ICD-10-CM | POA: Diagnosis not present

## 2018-08-17 DIAGNOSIS — H25011 Cortical age-related cataract, right eye: Secondary | ICD-10-CM | POA: Diagnosis not present

## 2018-08-17 NOTE — Progress Notes (Signed)
Patient was told to come in to give [place] a urine sample to see if her urine has cleared.   POCT UA was performed and everything came back negative.

## 2018-08-17 NOTE — Telephone Encounter (Signed)
I tried to contact this patient to inform her that the urine dip was negative but that I will consult with Dr. Ancil Boozer to see if she wanted to proceed with a urine culture or not, but there was no answer. A message was left for her with this information on her voicemail and I asked her to give [place] a call back if she had any questions.

## 2018-08-17 NOTE — Telephone Encounter (Signed)
Copied from [place] [id number]. Topic: General - Other >> Aug 17, 2018 10:44 AM Yvette Rack wrote: Reason for CRM: pt calling for lab results of urine

## 2018-08-24 ENCOUNTER — Ambulatory Visit
Admission: RE | Admit: 2018-08-24 | Discharge: 2018-08-24 | Disposition: A | Discharge status: Home / Self Care | Payer: BLUE CROSS/BLUE SHIELD | Source: Ambulatory Visit | Attending: Family Medicine | Admitting: Family Medicine

## 2018-08-24 DIAGNOSIS — Z1231 Encounter for screening mammogram for malignant neoplasm of breast: Secondary | ICD-10-CM | POA: Diagnosis not present

## 2018-08-31 DIAGNOSIS — H16401 Unspecified corneal neovascularization, right eye: Secondary | ICD-10-CM | POA: Diagnosis not present

## 2018-08-31 DIAGNOSIS — H25012 Cortical age-related cataract, left eye: Secondary | ICD-10-CM | POA: Diagnosis not present

## 2018-09-07 ENCOUNTER — Ambulatory Visit (INDEPENDENT_AMBULATORY_CARE_PROVIDER_SITE_OTHER): Payer: BLUE CROSS/BLUE SHIELD

## 2018-09-07 DIAGNOSIS — Z23 Encounter for immunization: Secondary | ICD-10-CM | POA: Diagnosis not present

## 2018-09-07 NOTE — Progress Notes (Signed)
flu

## 2018-09-11 ENCOUNTER — Encounter: Payer: BLUE CROSS/BLUE SHIELD | Admitting: Obstetrics and Gynecology

## 2018-09-13 ENCOUNTER — Other Ambulatory Visit: Payer: Self-pay | Admitting: Family Medicine

## 2018-09-13 DIAGNOSIS — K5909 Other constipation: Secondary | ICD-10-CM

## 2018-09-25 ENCOUNTER — Encounter: Payer: Self-pay | Admitting: Family Medicine

## 2018-09-25 ENCOUNTER — Ambulatory Visit: Payer: BLUE CROSS/BLUE SHIELD | Admitting: Family Medicine

## 2018-09-25 VITALS — BP 118/62 | HR 73 | Temp 97.5°F | Resp 14 | Ht 65.0 in | Wt 116.8 lb

## 2018-09-25 DIAGNOSIS — J069 Acute upper respiratory infection, unspecified: Secondary | ICD-10-CM | POA: Diagnosis not present

## 2018-09-25 DIAGNOSIS — J4541 Moderate persistent asthma with (acute) exacerbation: Secondary | ICD-10-CM | POA: Diagnosis not present

## 2018-09-25 MED ORDER — PREDNISONE 10 MG PO TABS: 10.0000 mg | ORAL_TABLET | Freq: Three times a day (TID) | ORAL | 0 refills | Status: DC

## 2018-09-25 MED ORDER — BENZONATATE 100 MG PO CAPS: 100.0000 mg | ORAL_CAPSULE | Freq: Two times a day (BID) | ORAL | 0 refills | Status: DC | PRN

## 2018-09-25 NOTE — Progress Notes (Signed)
Name: Jessica Carroll   MRN: [id number]    DOB: 1971-01-07   Date:09/25/2018       Progress Note  Subjective  Chief Complaint  Chief Complaint  Patient presents with  . URI    patient presents with cough (mostly at night), sore throat (hard to swallow - pain in the center), runny nose (clear), sinus pressure, nasal passage way burns. tightness in chest, no nausea, no vomitting, no diarrhea,   . Headache    frontal headache and some at the base of her neck  . Fatigue    HPI  URI: she has a history of asthma, she sates that over the past four days she developed a sore throat, nasal congestion, followed by rhinorrhea, frontal headache that is worse this am. Lack of appetite, chills , and now also having cough, chest feels tight but not sure if wheezing. Cough is dry. No fever. No change in bowel movements. Boyfriend was sick also but improved with otc. She have been taking Nyquil, Dayquil and tylenol and is still not improving. She is also taking Breo daily, had to use albuterol once also. She states cough is worse at night and present every night since she has been sick.    Patient Active Problem List   Diagnosis Date Noted  . Type A blood, Rh positive 08/22/2017  . S/P vaginal hysterectomy 07/31/2017  . Recurrent vaginitis 01/10/2017  . Hemorrhoids, external without complications 76/28/3151  . Nausea in adult patient 02/24/2016  . Chronic constipation 08/14/2015  . Monilial vaginitis 08/14/2015  . Bilateral ganglion cysts of wrists 08/14/2015  . Mild mitral regurgitation 05/19/2015  . B12 deficiency 05/18/2015  . Asthma, moderate 05/18/2015  . Anxiety 05/18/2015  . Insomnia, persistent 05/18/2015  . Eczema of hand 05/18/2015  . Viral keratitis 05/18/2015  . Migraine without aura and without status migrainosus, not intractable 05/18/2015  . Numerous moles 05/18/2015  . Allergic rhinitis 05/18/2015  . History of corneal transplant 05/18/2015  . Gastroesophageal reflux disease  without esophagitis 12/16/2009    Past Surgical History:  Procedure Laterality Date  . ABDOMINAL HYSTERECTOMY    . COLONOSCOPY WITH PROPOFOL N/A 09/29/2017   Procedure: COLONOSCOPY WITH PROPOFOL;  Surgeon: Jonathon Bellows, MD;  Location: [facility] ENDOSCOPY;  Service: Gastroenterology;  Laterality: N/A;  . CORNEAL TRANSPLANT Right 01/07/2013  . ESOPHAGOGASTRODUODENOSCOPY (EGD) WITH PROPOFOL N/A 09/29/2017   Procedure: ESOPHAGOGASTRODUODENOSCOPY (EGD) WITH PROPOFOL;  Surgeon: Jonathon Bellows, MD;  Location: [facility] ENDOSCOPY;  Service: Gastroenterology;  Laterality: N/A;  . EYE SURGERY    . eyelid surgery Right June 19, 2015   [facility] by Dr. Norlene Duel  . LAPAROSCOPIC BILATERAL SALPINGECTOMY N/A 01/25/2016   Procedure: LAPAROSCOPIC BILATERAL SALPINGECTOMY, ADHESIOLYSIS, LAPAROSCOPIC EXCISION/FULGERATION ENDOMETRIOSIS ;  Surgeon: [name], MD;  Location: [facility] ORS;  Service: Gynecology;  Laterality: N/A;  . Laparoscopic excision and fulguration of endometriosis N/A    bladder flap and uterosacral ligament endometriosis, excised and cauterized  . UPPER GI ENDOSCOPY  07/19/2012  . VAGINAL HYSTERECTOMY N/A 07/31/2017   Procedure: HYSTERECTOMY VAGINAL;  Surgeon: [name], MD;  Location: [facility] ORS;  Service: Gynecology;  Laterality: N/A;    Family History  Adopted: Yes  Problem Relation Age of Onset  . Hyperlipidemia Mother   . Hypertension Mother   . Diabetes Father   . Heart failure Father 42       hear attack  . Cancer Neg Hx   . Breast cancer Neg Hx     Social History  Socioeconomic History  . Marital status: Single    Spouse name: Not on file  . Number of children: 0  . Years of education: 23  . Highest education level: High school graduate  Occupational History  . Occupation: Education officer, community  . Financial resource strain: Patient refused  . Food insecurity:    Worry: Never true    Inability: Never true  . Transportation needs:    Medical: No     Non-medical: No  Tobacco Use  . Smoking status: Never Smoker  . Smokeless tobacco: Never Used  Substance and Sexual Activity  . Alcohol use: Yes    Alcohol/week: 0.0 standard drinks    Comment: occ. wine  . Drug use: No  . Sexual activity: Yes    Partners: Male    Birth control/protection: None, Surgical  Lifestyle  . Physical activity:    Days per week: 5 days    Minutes per session: 150+ min  . Stress: To some extent  Relationships  . Social connections:    Talks on phone: More than three times a week    Gets together: Once a week    Attends religious service: More than 4 times per year    Active member of club or organization: No    Attends meetings of clubs or organizations: Never    Relationship status: Never married  . Intimate partner violence:    Fear of current or ex partner: No    Emotionally abused: No    Physically abused: No    Forced sexual activity: No  Other Topics Concern  . Not on file  Social History Narrative   Engaged to get married to her boyfriend of 14 years.     Current Outpatient Medications:  .  albuterol (PROVENTIL HFA) 108 (90 Base) MCG/ACT inhaler, Inhale 2 puffs into the lungs every 6 (six) hours as needed for wheezing or shortness of breath., Disp: 1 Inhaler, Rfl: 1 .  ALPRAZolam (XANAX) 0.5 MG tablet, Take 1 tablet (0.5 mg total) by mouth 2 (two) times daily as needed for anxiety., Disp: 20 tablet, Rfl: 0 .  BREO ELLIPTA 100-25 MCG/INH AEPB, INHALE 1 PUFF INTO LUNGS EVERY DAY, Disp: 60 each, Rfl: 5 .  DEXILANT 60 MG capsule, Take 1 capsule (60 mg total) by mouth daily., Disp: 30 capsule, Rfl: 5 .  ibuprofen (ADVIL,MOTRIN) 800 MG tablet, Take 1 tablet (800 mg total) every 8 (eight) hours as needed by mouth., Disp: 30 tablet, Rfl: 1 .  LINZESS 72 MCG capsule, TAKE 1 CAPSULE(72 MCG) BY MOUTH DAILY BEFORE BREAKFAST, Disp: 30 capsule, Rfl: 0 .  MULTIPLE VITAMIN PO, Take 1 tablet by mouth daily., Disp: , Rfl:  .  prednisoLONE acetate (PRED  FORTE) 1 % ophthalmic suspension, Place 1 drop into the right eye daily. , Disp: , Rfl:  .  promethazine (PHENERGAN) 12.5 MG tablet, Take 1 tablet (12.5 mg total) by mouth every 8 (eight) hours as needed for nausea or vomiting., Disp: 20 tablet, Rfl: 0 .  rizatriptan (MAXALT) 10 MG tablet, Take 1 tablet (10 mg total) by mouth 2 (two) times daily as needed., Disp: 10 tablet, Rfl: 2 .  triamcinolone cream (KENALOG) 0.1 %, Apply 1 application topically 2 (two) times daily as needed., Disp: 30 g, Rfl: 0 .  valACYclovir (VALTREX) 500 MG tablet, Take 1 tablet by mouth 2 (two) times daily., Disp: , Rfl: 0 .  fluticasone (FLONASE) 50 MCG/ACT nasal spray, Place 2 sprays into both nostrils  daily. (Patient not taking: Reported on 07/10/2018), Disp: 16 g, Rfl: 2 .  ketotifen (ZADITOR) 0.025 % ophthalmic solution, Place 1 drop into the right eye daily., Disp: , Rfl:  .  STOOL SOFTENER 100 MG capsule, , Disp: , Rfl: 0 .  Travoprost, BAK Free, (TRAVATAN) 0.004 % SOLN ophthalmic solution, Place 1 drop into the right eye at bedtime., Disp: , Rfl:   Allergies  Allergen Reactions  . Bactrim [Sulfamethoxazole-Trimethoprim] Nausea Only  . Penicillins Nausea Only    I personally reviewed active problem list, medication list, allergies, family history, social history with the patient/caregiver today.   ROS  Ten systems reviewed and is negative except as mentioned in HPI  Objective  Vitals:   09/25/18 1148  BP: 118/62  Pulse: 73  Resp: 14  Temp: (!) 97.5 F (36.4 C)  TempSrc: Oral  SpO2: 95%  Weight: 116 lb 12.8 oz (53 kg)  Height: 5\' 5"  (1.651 m)    Body mass index is 19.44 kg/m.  Physical Exam  Constitutional: Patient appears well-developed and well-nourished.  No distress.  HEENT: head atraumatic, normocephalic, pupils equal and reactive to light, ears normal TM,  neck supple, throat within normal limits, clear on rhinorrhea Cardiovascular: Normal rate, regular rhythm and normal heart sounds.   No murmur heard. No BLE edema. Pulmonary/Chest: Effort normal and breath sounds normal. No respiratory distress. Abdominal: Soft.  There is no tenderness. Psychiatric: Patient has a normal mood and affect. behavior is normal. Judgment and thought content normal.  Recent Results (from the past 2160 hour(s))  Cervicovaginal ancillary only     Status: None   Collection Time: 08/02/18 12:00 AM  Result Value Ref Range   Bacterial vaginitis Negative for Bacterial Vaginitis Microorganisms     Comment: Normal Reference Range - Negative   Candida vaginitis Negative for Candida species     Comment: Normal Reference Range - Negative   Chlamydia Negative     Comment: Normal Reference Range - Negative   Neisseria gonorrhea Negative     Comment: Normal Reference Range - Negative   Trichomonas Negative     Comment: Normal Reference Range - Negative  POCT urinalysis dipstick     Status: Abnormal   Collection Time: 08/02/18  9:04 AM  Result Value Ref Range   Color, UA yellow    Clarity, UA clear    Glucose, UA Negative Negative   Bilirubin, UA negative    Ketones, UA negative    Spec Grav, UA 1.010 1.010 - 1.025   Blood, UA trace    pH, UA 6.5 5.0 - 8.0   Protein, UA Negative Negative   Urobilinogen, UA negative (A) 0.2 or 1.0 E.U./dL   Nitrite, UA negative    Leukocytes, UA Trace (A) Negative   Appearance clear    Odor none   Urine Culture     Status: None   Collection Time: 08/02/18  9:29 AM  Result Value Ref Range   MICRO NUMBER: [id number]    SPECIMEN QUALITY: ADEQUATE    Sample Source URINE    STATUS: FINAL    Result:      Single organism less than 10,000 CFU/mL isolated. These organisms, commonly found on external and internal genitalia, are considered colonizers. No further testing performed.  POCT urinalysis dipstick     Status: Normal   Collection Time: 08/16/18  4:25 PM  Result Value Ref Range   Color, UA yellow    Clarity, UA clear    Glucose, UA Negative  Negative    Bilirubin, UA negative    Ketones, UA negative    Spec Grav, UA 1.025 1.010 - 1.025   Blood, UA non-hemolyzed trace    pH, UA 6.5 5.0 - 8.0   Protein, UA Negative Negative   Urobilinogen, UA 0.2 0.2 or 1.0 E.U./dL   Nitrite, UA negative    Leukocytes, UA Negative Negative   Appearance clear    Odor none      PHQ2/9: Depression screen [facility] 2/9 09/25/2018 08/02/2018 07/10/2018 08/22/2017 01/24/2017  Decreased Interest 0 0 1 0 0  Down, Depressed, Hopeless 0 0 1 0 0  PHQ - 2 Score 0 0 2 0 0  Altered sleeping 2 1 3  - -  Tired, decreased energy 3 1 3  - -  Change in appetite 1 0 3 - -  Feeling bad or failure about yourself  0 0 0 - -  Trouble concentrating 0 0 0 - -  Moving slowly or fidgety/restless 0 0 0 - -  Suicidal thoughts 0 0 0 - -  PHQ-9 Score 6 2 11  - -  Difficult doing work/chores - Not difficult at all Somewhat difficult - -     Fall Risk: Fall Risk  09/25/2018 08/02/2018 07/10/2018 11/22/2017 08/22/2017  Falls in the past year? No No No No No  Risk for fall due to : - - - - -    Functional Status Survey: Is the patient deaf or have difficulty hearing?: No Does the patient have difficulty seeing, even when wearing glasses/contacts?: No Does the patient have difficulty concentrating, remembering, or making decisions?: No Does the patient have difficulty walking or climbing stairs?: No Does the patient have difficulty dressing or bathing?: No Does the patient have difficulty doing errands alone such as visiting a doctor's office or shopping?: No    Assessment & Plan  1. URI, acute  Use saline flush, may try mucinex DM   - benzonatate (TESSALON) 100 MG capsule; Take 1-2 capsules (100-200 mg total) by mouth 2 (two) times daily as needed.  Dispense: 40 capsule; Refill: 0  2. Moderate persistent asthma with exacerbation  - predniSONE (DELTASONE) 10 MG tablet; Take 1 tablet (10 mg total) by mouth 3 (three) times daily with meals.  Dispense: 15 tablet; Refill: 0

## 2018-09-25 NOTE — Patient Instructions (Signed)
Upper Respiratory Infection, Adult Most upper respiratory infections (URIs) are a viral infection of the air passages leading to the lungs. A URI affects the nose, throat, and upper air passages. The most common type of URI is nasopharyngitis and is typically referred to as "the common cold." URIs run their course and usually go away on their own. Most of the time, a URI does not require medical attention, but sometimes a bacterial infection in the upper airways can follow a viral infection. This is called a secondary infection. Sinus and middle ear infections are common types of secondary upper respiratory infections. Bacterial pneumonia can also complicate a URI. A URI can worsen asthma and chronic obstructive pulmonary disease (COPD). Sometimes, these complications can require emergency medical care and may be life threatening. What are the causes? Almost all URIs are caused by viruses. A virus is a type of germ and can spread from one person to another. What increases the risk? You may be at risk for a URI if:  You smoke.  You have chronic heart or lung disease.  You have a weakened defense (immune) system.  You are very young or very old.  You have nasal allergies or asthma.  You work in crowded or poorly ventilated areas.  You work in health care facilities or schools.  What are the signs or symptoms? Symptoms typically develop 2-3 days after you come in contact with a cold virus. Most viral URIs last 7-10 days. However, viral URIs from the influenza virus (flu virus) can last 14-18 days and are typically more severe. Symptoms may include:  Runny or stuffy (congested) nose.  Sneezing.  Cough.  Sore throat.  Headache.  Fatigue.  Fever.  Loss of appetite.  Pain in your forehead, behind your eyes, and over your cheekbones (sinus pain).  Muscle aches.  How is this diagnosed? Your health care provider may diagnose a URI by:  Physical exam.  Tests to check that your  symptoms are not due to another condition such as: ? Strep throat. ? Sinusitis. ? Pneumonia. ? Asthma.  How is this treated? A URI goes away on its own with time. It cannot be cured with medicines, but medicines may be prescribed or recommended to relieve symptoms. Medicines may help:  Reduce your fever.  Reduce your cough.  Relieve nasal congestion.  Follow these instructions at home:  Take medicines only as directed by your health care provider.  Gargle warm saltwater or take cough drops to comfort your throat as directed by your health care provider.  Use a warm mist humidifier or inhale steam from a shower to increase air moisture. This may make it easier to breathe.  Drink enough fluid to keep your urine clear or pale yellow.  Eat soups and other clear broths and maintain good nutrition.  Rest as needed.  Return to work when your temperature has returned to normal or as your health care provider advises. You may need to stay home longer to avoid infecting others. You can also use a face mask and careful hand washing to prevent spread of the virus.  Increase the usage of your inhaler if you have asthma.  Do not use any tobacco products, including cigarettes, chewing tobacco, or electronic cigarettes. If you need help quitting, ask your health care provider. How is this prevented? The best way to protect yourself from getting a cold is to practice good hygiene.  Avoid oral or hand contact with people with cold symptoms.  Wash your   hands often if contact occurs.  There is no clear evidence that vitamin C, vitamin E, echinacea, or exercise reduces the chance of developing a cold. However, it is always recommended to get plenty of rest, exercise, and practice good nutrition. Contact a health care provider if:  You are getting worse rather than better.  Your symptoms are not controlled by medicine.  You have chills.  You have worsening shortness of breath.  You have  brown or red mucus.  You have yellow or brown nasal discharge.  You have pain in your face, especially when you bend forward.  You have a fever.  You have swollen neck glands.  You have pain while swallowing.  You have white areas in the back of your throat. Get help right away if:  You have severe or persistent: ? Headache. ? Ear pain. ? Sinus pain. ? Chest pain.  You have chronic lung disease and any of the following: ? Wheezing. ? Prolonged cough. ? Coughing up blood. ? A change in your usual mucus.  You have a stiff neck.  You have changes in your: ? Vision. ? Hearing. ? Thinking. ? Mood. This information is not intended to replace advice given to you by your health care provider. Make sure you discuss any questions you have with your health care provider. Document Released: 05/24/2001 Document Revised: 07/31/2016 Document Reviewed: 03/05/2014 Elsevier Interactive Patient Education  2018 [employer]  

## 2018-10-01 ENCOUNTER — Telehealth: Payer: Self-pay

## 2018-10-01 MED ORDER — AZITHROMYCIN 250 MG PO TABS: ORAL_TABLET | ORAL | 0 refills | Status: DC

## 2018-10-01 NOTE — Telephone Encounter (Signed)
Patient notified Dr. Ancil Boozer sent in a Z-pak and will try it. Informed her if still no improvement to come in. Patient also states after taking the cough medication and antibiotic she is still having chest discomfort.

## 2018-10-01 NOTE — Telephone Encounter (Signed)
Copied from [place] [id number]. Topic: Quick Communication - See Telephone Encounter >> Oct 01, 2018  8:43 AM Marja Kays F wrote: Pt is calling to see if something different can be called in -she saw dr Ancil Boozer on 09/25/18 and was given tusselon pearls for cough and that is not working -she is still having the cough  Espically at night and would like something different called in   [place]  Best number [phone]

## 2018-10-01 NOTE — Telephone Encounter (Signed)
Sent zpack to pharmacy but she will need to follow if no improvement

## 2018-10-04 ENCOUNTER — Ambulatory Visit: Payer: BLUE CROSS/BLUE SHIELD | Admitting: Obstetrics and Gynecology

## 2018-10-04 VITALS — BP 97/58 | HR 62 | Ht 65.0 in | Wt 120.3 lb

## 2018-10-04 DIAGNOSIS — N809 Endometriosis, unspecified: Secondary | ICD-10-CM

## 2018-10-04 DIAGNOSIS — B3731 Acute candidiasis of vulva and vagina: Secondary | ICD-10-CM

## 2018-10-04 DIAGNOSIS — R61 Generalized hyperhidrosis: Secondary | ICD-10-CM

## 2018-10-04 DIAGNOSIS — B373 Candidiasis of vulva and vagina: Secondary | ICD-10-CM

## 2018-10-04 MED ORDER — ESTRADIOL 1 MG PO TABS: 1.0000 mg | ORAL_TABLET | Freq: Every day | ORAL | 2 refills | Status: DC

## 2018-10-04 MED ORDER — TERCONAZOLE 0.4 % VA CREA: 1.0000 | TOPICAL_CREAM | Freq: Every day | VAGINAL | 0 refills | Status: DC

## 2018-10-04 NOTE — Patient Instructions (Addendum)
1.  FSH is drawn today to assess for menopause 2.  Wet prep is inconclusive today.  Probable incomplete monilia vaginitis is present. 3.  Terazol 3 intravaginal daily x3 days 4.  Trial of estradiol 1 mg daily for vasomotor symptom management 5.  Return in mid December for follow-up  Estradiol tablets What is this medicine? ESTRADIOL (es tra DYE ole) is an estrogen. It is mostly used as hormone replacement in menopausal women. It helps to treat hot flashes and prevent osteoporosis. It is also used to treat women with low estrogen levels or those who have had their ovaries removed. This medicine may be used for other purposes; ask your health care provider or pharmacist if you have questions. COMMON BRAND NAME(S): Estrace, Femtrace, Gynodiol What should I tell my health care provider before I take this medicine? They need to know if you have or ever had any of these conditions: -abnormal vaginal bleeding -blood vessel disease or blood clots -breast, cervical, endometrial, ovarian, liver, or uterine cancer -dementia -diabetes -gallbladder disease -heart disease or recent heart attack -high blood pressure -high cholesterol -high level of calcium in the blood -hysterectomy -kidney disease -liver disease -migraine headaches -protein C deficiency -protein S deficiency -stroke -systemic lupus erythematosus (SLE) -tobacco smoker -an unusual or allergic reaction to estrogens, other hormones, medicines, foods, dyes, or preservatives -pregnant or trying to get pregnant -breast-feeding How should I use this medicine? Take this medicine by mouth. To reduce nausea, this medicine may be taken with food. Follow the directions on the prescription label. Take this medicine at the same time each day and in the order directed on the package. Do not take your medicine more often than directed. Contact your pediatrician regarding the use of this medicine in children. Special care may be needed. A  patient package insert for the product will be given with each prescription and refill. Read this sheet carefully each time. The sheet may change frequently. Overdosage: If you think you have taken too much of this medicine contact a poison control center or emergency room at once. NOTE: This medicine is only for you. Do not share this medicine with others. What if I miss a dose? If you miss a dose, take it as soon as you can. If it is almost time for your next dose, take only that dose. Do not take double or extra doses. What may interact with this medicine? Do not take this medicine with any of the following medications: -aromatase inhibitors like aminoglutethimide, anastrozole, exemestane, letrozole, testolactone This medicine may also interact with the following medications: -carbamazepine -certain antibiotics used to treat infections -certain barbiturates or benzodiazepines used for inducing sleep or treating seizures -grapefruit juice -medicines for fungus infections like itraconazole and ketoconazole -raloxifene or tamoxifen -rifabutin, rifampin, or rifapentine -ritonavir -St. John's Wort -warfarin This list may not describe all possible interactions. Give your health care provider a list of all the medicines, herbs, non-prescription drugs, or dietary supplements you use. Also tell them if you smoke, drink alcohol, or use illegal drugs. Some items may interact with your medicine. What should I watch for while using this medicine? Visit your doctor or health care professional for regular checks on your progress. You will need a regular breast and pelvic exam and Pap smear while on this medicine. You should also discuss the need for regular mammograms with your health care professional, and follow his or her guidelines for these tests. This medicine can make your body retain fluid, making your fingers,  hands, or ankles swell. Your blood pressure can go up. Contact your doctor or health  care professional if you feel you are retaining fluid. If you have any reason to think you are pregnant, stop taking this medicine right away and contact your doctor or health care professional. Smoking increases the risk of getting a blood clot or having a stroke while you are taking this medicine, especially if you are more than 48 years old. You are strongly advised not to smoke. If you wear contact lenses and notice visual changes, or if the lenses begin to feel uncomfortable, consult your eye doctor or health care professional. This medicine can increase the risk of developing a condition (endometrial hyperplasia) that may lead to cancer of the lining of the uterus. Taking progestins, another hormone drug, with this medicine lowers the risk of developing this condition. Therefore, if your uterus has not been removed (by a hysterectomy), your doctor may prescribe a progestin for you to take together with your estrogen. You should know, however, that taking estrogens with progestins may have additional health risks. You should discuss the use of estrogens and progestins with your health care professional to determine the benefits and risks for you. If you are going to have surgery, you may need to stop taking this medicine. Consult your health care professional for advice before you schedule the surgery. What side effects may I notice from receiving this medicine? Side effects that you should report to your doctor or health care professional as soon as possible: -allergic reactions like skin rash, itching or hives, swelling of the face, lips, or tongue -breast tissue changes or discharge -changes in vision -chest pain -confusion, trouble speaking or understanding -dark urine -general ill feeling or flu-like symptoms -light-colored stools -nausea, vomiting -pain, swelling, warmth in the leg -right upper belly pain -severe headaches -shortness of breath -sudden numbness or weakness of the face,  arm or leg -trouble walking, dizziness, loss of balance or coordination -unusual vaginal bleeding -yellowing of the eyes or skin Side effects that usually do not require medical attention (report to your doctor or health care professional if they continue or are bothersome): -hair loss -increased hunger or thirst -increased urination -symptoms of vaginal infection like itching, irritation or unusual discharge -unusually weak or tired This list may not describe all possible side effects. Call your doctor for medical advice about side effects. You may report side effects to FDA at 1-800-FDA-1088. Where should I keep my medicine? Keep out of the reach of children. Store at room temperature between 20 and 25 degrees C (68 and 77 degrees F). Keep container tightly closed. Protect from light. Throw away any unused medicine after the expiration date. NOTE: This sheet is a summary. It may not cover all possible information. If you have questions about this medicine, talk to your doctor, pharmacist, or health care provider.  2018 [employer] Standard (2011-03-02 32:44:01)

## 2018-10-04 NOTE — Progress Notes (Signed)
Chief complaint: 1.  Endometriosis 2.  Vaginitis  Jessica Carroll is a 47 year old white female status post TVH for some traumatic endometriosis in the past, on no ERT, presents for evaluation of endometriosis and also for evaluation of vaginitis.  Jessica Carroll states that she began having vulvar itching and burning and was treated with Diflucan approximately 1 week ago.  She still has persistent vaginal itching and burning.  Jessica Carroll is experiencing vasomotor symptoms in the form of hot flashes and night sweats.  She also has noticed some vaginal dryness.  At the time of her hysterectomy the ovaries were conserved.  She also is noting some mood swings and fatigue.  Jessica Carroll does not report any significant pelvic pain although she does have some mild discomfort due to vaginal dryness.  Previous endometriosis pain is resolved.  Past medical history, past surgical history, problem list, medications, and allergies are reviewed  OBJECTIVE: BP (!) 97/58   Pulse 62   Ht 5\' 5"  (1.651 m)   Wt 120 lb 4.8 oz (54.6 kg)   LMP 07/11/2017 (Exact Date)   BMI 20.02 kg/m  Pleasant well-appearing female no acute distress.  Alert and oriented. Abdomen: Soft, nontender without organomegaly Bladder: Nontender Pelvic exam: External genitalia-normal BUS-normal Vagina-white secretions are notable in the vaginal vault; vaginal vault has good support Cervix-surgically absent Uterus-surgically absent Bimanual-no palpable masses or tenderness adnexa-nonpalpable nontender Rectovaginal-normal external exam  PROCEDURE: Wet prep Normal saline-negative KOH-no evidence of yeast  ASSESSMENT: 1.  Vasomotor symptoms likely consistent with climacteric or menopause 2.  Suspected partially treated monilia vaginitis  PLAN: 1.  Terazol 3 intravaginal x3 days 2.  Dooms 3.  Begin trial of estradiol 1 mg daily 4.  Return in second week of December for follow-up on vasomotor symptoms.  Pros and cons of ERT were discussed including  potential side effects.  A total of 15 minutes were spent face-to-face with the patient during this encounter and over half of that time dealt with counseling and coordination of care.  [name], MD  Note: This dictation was prepared with Dragon dictation along with smaller phrase technology. Any transcriptional errors that result from this process are unintentional.

## 2018-10-05 LAB — FOLLICLE STIMULATING HORMONE: FSH: 34.2 m[IU]/mL

## 2018-10-09 ENCOUNTER — Telehealth: Payer: Self-pay | Admitting: Obstetrics and Gynecology

## 2018-10-09 NOTE — Telephone Encounter (Signed)
Pt aware per vm- Mad will need to sign off on [facility] before I can release it. Pt aware mad on vacay until 10/15/2018.

## 2018-10-09 NOTE — Telephone Encounter (Signed)
The patient is requesting her TSH results and has an active MyChart and would like to be able to view those on her chart if possible, please advise, thanks.

## 2018-10-11 ENCOUNTER — Encounter: Payer: 59 | Admitting: Obstetrics and Gynecology

## 2018-10-15 ENCOUNTER — Other Ambulatory Visit: Payer: Self-pay | Admitting: Family Medicine

## 2018-10-15 DIAGNOSIS — K5909 Other constipation: Secondary | ICD-10-CM

## 2018-10-16 NOTE — Telephone Encounter (Signed)
Pt aware per vm.  

## 2018-10-17 ENCOUNTER — Ambulatory Visit (INDEPENDENT_AMBULATORY_CARE_PROVIDER_SITE_OTHER): Payer: BLUE CROSS/BLUE SHIELD | Admitting: Family Medicine

## 2018-10-17 ENCOUNTER — Encounter: Payer: Self-pay | Admitting: Family Medicine

## 2018-10-17 VITALS — BP 104/68 | HR 61 | Temp 97.8°F | Resp 16 | Ht 65.0 in | Wt 119.4 lb

## 2018-10-17 DIAGNOSIS — E538 Deficiency of other specified B group vitamins: Secondary | ICD-10-CM | POA: Diagnosis not present

## 2018-10-17 DIAGNOSIS — Z13220 Encounter for screening for lipoid disorders: Secondary | ICD-10-CM | POA: Diagnosis not present

## 2018-10-17 DIAGNOSIS — D508 Other iron deficiency anemias: Secondary | ICD-10-CM | POA: Diagnosis not present

## 2018-10-17 DIAGNOSIS — Z79899 Other long term (current) drug therapy: Secondary | ICD-10-CM

## 2018-10-17 DIAGNOSIS — Z1159 Encounter for screening for other viral diseases: Secondary | ICD-10-CM

## 2018-10-17 DIAGNOSIS — Z01419 Encounter for gynecological examination (general) (routine) without abnormal findings: Secondary | ICD-10-CM | POA: Diagnosis not present

## 2018-10-17 DIAGNOSIS — N951 Menopausal and female climacteric states: Secondary | ICD-10-CM

## 2018-10-17 DIAGNOSIS — F331 Major depressive disorder, recurrent, moderate: Secondary | ICD-10-CM

## 2018-10-17 DIAGNOSIS — Z131 Encounter for screening for diabetes mellitus: Secondary | ICD-10-CM

## 2018-10-17 MED ORDER — VENLAFAXINE HCL ER 37.5 MG PO CP24: 37.5000 mg | ORAL_CAPSULE | Freq: Every day | ORAL | 0 refills | Status: DC

## 2018-10-17 NOTE — Progress Notes (Signed)
Name: Jessica Carroll   MRN: [id number]    DOB: Aug 12, 1971   Date:10/17/2018       Progress Note  Subjective  Chief Complaint  Chief Complaint  Patient presents with  . Annual Exam    patient had a partial hysterectomy and only has her ovaries left  . Labs Only    HPI   Patient presents for annual CPE   Mood changes/hot flashes and night sweats: she has noticed worsening of mood swings, also no motivation, spent the entire past weekend inside her house. She is still able to clear her home, go to work, but does not has the desire. Boyfriend is worried because she is very moody and snappy. She has been unable to sleep well because of night sweats and has difficulty unwinding at the end of the day. She saw Dr. Enzo Bi and was started on Estrogen supplementation for the past couple of weeks but no improvement of symptoms but developed breast tenderness. She would like to have something to help with symptoms. She also has a history of depression in the past, but getting worse, she never took medications for depression in the past   Diet: balanced  Exercise: not lately      Office Visit from 10/17/2018 in [facility]  AUDIT-C Score  0     Depression:  Depression screen [facility] 2/9 10/17/2018 09/25/2018 08/02/2018 07/10/2018 08/22/2017  Decreased Interest 2 0 0 1 0  Down, Depressed, Hopeless 2 0 0 1 0  PHQ - 2 Score 4 0 0 2 0  Altered sleeping _0 -  Tired, decreased energy _1 -  Change in appetite 2 1 0 3 -  Feeling bad or failure about yourself  0 0 0 0 -  Trouble concentrating 0 0 0 0 -  Moving slowly or fidgety/restless 0 0 0 0 -  Suicidal thoughts 0 0 0 0 -  PHQ-9 Score _2 -  Difficult doing work/chores - - Not difficult at all Somewhat difficult -   Hypertension: BP Readings from Last 3 Encounters:  10/17/18 104/68  10/04/18 (!) 97/58  09/25/18 118/62   Obesity: Wt Readings from Last 3 Encounters:  10/17/18 119 lb 6.4 oz (54.2 kg)   10/04/18 120 lb 4.8 oz (54.6 kg)  09/25/18 116 lb 12.8 oz (53 kg)   BMI Readings from Last 3 Encounters:  10/17/18 19.87 kg/m  10/04/18 20.02 kg/m  09/25/18 19.44 kg/m    Hep C Screening: today  STD testing and prevention (HIV/chl/gon/syphilis): not interested at this time Intimate partner violence: negative  Sexual History/Pain during Intercourse: decrease in libido, some vaginal dryness, seen by Dr. Enzo Bi and was given premarin  Menstrual History/LMP/Abnormal Bleeding: s/p hysterectomy  Incontinence Symptoms:   Advanced Care Planning: A voluntary discussion about advance care planning including the explanation and discussion of advance directives.  Discussed health care proxy and Living will, and the patient was able to identify a health care proxy as mother .  Patient does not have a living will at present time.   Breast cancer: up to date   BRCA gene screening: N/A Cervical cancer screening: not indicated   Osteoporosis Screening: discussed high calcium and vitamin D diet   Lipids:  Lab Results  Component Value Date   CHOL 172 08/22/2017   CHOL 179 08/17/2016   CHOL 196 08/14/2015   Lab Results  Component Value Date   HDL 53 08/22/2017  HDL 66 08/17/2016   HDL 65 08/14/2015   Lab Results  Component Value Date   LDLCALC 102 (H) 08/22/2017   LDLCALC 101 08/17/2016   LDLCALC 120 (H) 08/14/2015   Lab Results  Component Value Date   TRIG 76 08/22/2017   TRIG 58 08/17/2016   TRIG 54 08/14/2015   Lab Results  Component Value Date   CHOLHDL 3.2 08/22/2017   CHOLHDL 2.7 08/17/2016   CHOLHDL 3.0 08/14/2015   No results found for: LDLDIRECT  Glucose:  Glucose  Date Value Ref Range Status  08/14/2015 97 65 - 99 mg/dL Final  05/18/2015 98 65 - 99 mg/dL Final   Glucose, Bld  Date Value Ref Range Status  08/22/2017 86 65 - 99 mg/dL Final    Comment:    .            Fasting reference interval .   08/17/2016 84 65 - 99 mg/dL Final    Skin  cancer: discussed atypical lesions Colorectal cancer: done in 2018 Lung cancer:   Low Dose CT Chest recommended if Age 70-80 years, 30 pack-year currently smoking OR have quit w/in 15years. Patient does not qualify.   ECG: had in 2011  Patient Active Problem List   Diagnosis Date Noted  . Type A blood, Rh positive 08/22/2017  . S/P vaginal hysterectomy 07/31/2017  . Recurrent vaginitis 01/10/2017  . Hemorrhoids, external without complications 95/62/1308  . Nausea in adult patient 02/24/2016  . Endometriosis determined by laparoscopy 01/25/2016  . Chronic constipation 08/14/2015  . Monilial vaginitis 08/14/2015  . Bilateral ganglion cysts of wrists 08/14/2015  . Mild mitral regurgitation 05/19/2015  . B12 deficiency 05/18/2015  . Asthma, moderate 05/18/2015  . Anxiety 05/18/2015  . Insomnia, persistent 05/18/2015  . Eczema of hand 05/18/2015  . Viral keratitis 05/18/2015  . Migraine without aura and without status migrainosus, not intractable 05/18/2015  . Numerous moles 05/18/2015  . Allergic rhinitis 05/18/2015  . History of corneal transplant 05/18/2015  . Gastroesophageal reflux disease without esophagitis 12/16/2009    Past Surgical History:  Procedure Laterality Date  . ABDOMINAL HYSTERECTOMY    . COLONOSCOPY WITH PROPOFOL N/A 09/29/2017   Procedure: COLONOSCOPY WITH PROPOFOL;  Surgeon: Jonathon Bellows, MD;  Location: [facility] ENDOSCOPY;  Service: Gastroenterology;  Laterality: N/A;  . CORNEAL TRANSPLANT Right 01/07/2013  . ESOPHAGOGASTRODUODENOSCOPY (EGD) WITH PROPOFOL N/A 09/29/2017   Procedure: ESOPHAGOGASTRODUODENOSCOPY (EGD) WITH PROPOFOL;  Surgeon: Jonathon Bellows, MD;  Location: [facility] ENDOSCOPY;  Service: Gastroenterology;  Laterality: N/A;  . EYE SURGERY    . eyelid surgery Right June 19, 2015   [facility] by Dr. Norlene Duel  . LAPAROSCOPIC BILATERAL SALPINGECTOMY N/A 01/25/2016   Procedure: LAPAROSCOPIC BILATERAL SALPINGECTOMY, ADHESIOLYSIS, LAPAROSCOPIC EXCISION/FULGERATION  ENDOMETRIOSIS ;  Surgeon: [name], MD;  Location: [facility] ORS;  Service: Gynecology;  Laterality: N/A;  . Laparoscopic excision and fulguration of endometriosis N/A    bladder flap and uterosacral ligament endometriosis, excised and cauterized  . UPPER GI ENDOSCOPY  07/19/2012  . VAGINAL HYSTERECTOMY N/A 07/31/2017   Procedure: HYSTERECTOMY VAGINAL;  Surgeon: [name], MD;  Location: [facility] ORS;  Service: Gynecology;  Laterality: N/A;    Family History  Adopted: Yes  Problem Relation Age of Onset  . Hyperlipidemia Mother   . Hypertension Mother   . Diabetes Father   . Heart failure Father 39       hear attack  . Cancer Neg Hx   . Breast cancer Neg Hx  Social History   Socioeconomic History  . Marital status: Single    Spouse name: Not on file  . Number of children: 0  . Years of education: 37  . Highest education level: High school graduate  Occupational History  . Occupation: Education officer, community  . Financial resource strain: Patient refused  . Food insecurity:    Worry: Never true    Inability: Never true  . Transportation needs:    Medical: No    Non-medical: No  Tobacco Use  . Smoking status: Never Smoker  . Smokeless tobacco: Never Used  Substance and Sexual Activity  . Alcohol use: Yes    Alcohol/week: 0.0 standard drinks    Comment: occ. wine  . Drug use: No  . Sexual activity: Yes    Partners: Male    Birth control/protection: None, Surgical  Lifestyle  . Physical activity:    Days per week: 5 days    Minutes per session: 150+ min  . Stress: To some extent  Relationships  . Social connections:    Talks on phone: More than three times a week    Gets together: Once a week    Attends religious service: More than 4 times per year    Active member of club or organization: No    Attends meetings of clubs or organizations: Never    Relationship status: Never married  . Intimate partner violence:    Fear of current or ex partner:  No    Emotionally abused: No    Physically abused: No    Forced sexual activity: No  Other Topics Concern  . Not on file  Social History Narrative   Engaged to get married to her boyfriend of 14 years.     Current Outpatient Medications:  .  albuterol (PROVENTIL HFA) 108 (90 Base) MCG/ACT inhaler, Inhale 2 puffs into the lungs every 6 (six) hours as needed for wheezing or shortness of breath., Disp: 1 Inhaler, Rfl: 1 .  ALPRAZolam (XANAX) 0.5 MG tablet, Take 1 tablet (0.5 mg total) by mouth 2 (two) times daily as needed for anxiety., Disp: 20 tablet, Rfl: 0 .  BREO ELLIPTA 100-25 MCG/INH AEPB, INHALE 1 PUFF INTO LUNGS EVERY DAY, Disp: 60 each, Rfl: 5 .  DEXILANT 60 MG capsule, Take 1 capsule (60 mg total) by mouth daily., Disp: 30 capsule, Rfl: 5 .  estradiol (ESTRACE) 1 MG tablet, Take 1 tablet (1 mg total) by mouth daily., Disp: 30 tablet, Rfl: 2 .  ibuprofen (ADVIL,MOTRIN) 800 MG tablet, Take 1 tablet (800 mg total) every 8 (eight) hours as needed by mouth., Disp: 30 tablet, Rfl: 1 .  ketotifen (ZADITOR) 0.025 % ophthalmic solution, Place 1 drop into the right eye daily., Disp: , Rfl:  .  LINZESS 72 MCG capsule, TAKE 1 CAPSULE(72 MCG) BY MOUTH DAILY BEFORE BREAKFAST, Disp: 30 capsule, Rfl: 0 .  MULTIPLE VITAMIN PO, Take 1 tablet by mouth daily., Disp: , Rfl:  .  prednisoLONE acetate (PRED FORTE) 1 % ophthalmic suspension, Place 1 drop into the right eye daily. , Disp: , Rfl:  .  promethazine (PHENERGAN) 12.5 MG tablet, Take 1 tablet (12.5 mg total) by mouth every 8 (eight) hours as needed for nausea or vomiting., Disp: 20 tablet, Rfl: 0 .  rizatriptan (MAXALT) 10 MG tablet, Take 1 tablet (10 mg total) by mouth 2 (two) times daily as needed., Disp: 10 tablet, Rfl: 2 .  terconazole (TERAZOL 7) 0.4 % vaginal cream, Place 1 applicator  vaginally at bedtime. X 5 nights, Disp: 45 g, Rfl: 0 .  Travoprost, BAK Free, (TRAVATAN) 0.004 % SOLN ophthalmic solution, Place 1 drop into the right eye at  bedtime., Disp: , Rfl:  .  triamcinolone cream (KENALOG) 0.1 %, Apply 1 application topically 2 (two) times daily as needed., Disp: 30 g, Rfl: 0 .  valACYclovir (VALTREX) 500 MG tablet, Take 1 tablet by mouth 2 (two) times daily., Disp: , Rfl: 0  Allergies  Allergen Reactions  . Bactrim [Sulfamethoxazole-Trimethoprim] Nausea Only  . Penicillins Nausea Only     ROS  Constitutional: Negative for fever or weight change.  Respiratory: Negative for cough and shortness of breath.   Cardiovascular: Negative for chest pain ( except when she has anxiety) or palpitations.  Gastrointestinal: Negative for abdominal pain, no bowel changes.  Musculoskeletal: Negative for gait problem or joint swelling.  Skin: Negative for rash.  Neurological: Negative for dizziness , positive for intermittent headache.  No other specific complaints in a complete review of systems (except as listed in HPI above).   Objective  Vitals:   10/17/18 0953  BP: 104/68  Pulse: 61  Resp: 16  Temp: 97.8 F (36.6 C)  TempSrc: Oral  SpO2: 99%  Weight: 119 lb 6.4 oz (54.2 kg)  Height: 5' 5" (1.651 m)    Body mass index is 19.87 kg/m.  Physical Exam  Constitutional: Patient appears well-developed and well-nourished. No distress.  HENT: Head: Normocephalic and atraumatic. Ears: B TMs ok, no erythema or effusion; Nose: Nose normal. Mouth/Throat: Oropharynx is clear and moist. No oropharyngeal exudate.  Eyes: Conjunctivae and EOM are normal. Pupils are equal, round, and reactive to light. No scleral icterus.  Neck: Normal range of motion. Neck supple. No JVD present. No thyromegaly present.  Cardiovascular: Normal rate, regular rhythm and normal heart sounds.  No murmur heard. No BLE edema. Pulmonary/Chest: Effort normal and breath sounds normal. No respiratory distress. Abdominal: Soft. Bowel sounds are normal, no distension. There is no tenderness. no masses Breast: no lumps or masses, no nipple discharge or  rashes FEMALE GENITALIA:  Pelvic not done - sees Dr. Enzo Bi  RECTAL: not done Musculoskeletal: Normal range of motion, no joint effusions. No gross deformities Neurological: he is alert and oriented to person, place, and time. No cranial nerve deficit. Coordination, balance, strength, speech and gait are normal.  Skin: Skin is warm and dry. No rash noted. No erythema.  Psychiatric: Patient has a normal mood and affect. behavior is normal. Judgment and thought content normal.  Recent Results (from the past 2160 hour(s))  Cervicovaginal ancillary only     Status: None   Collection Time: 08/02/18 12:00 AM  Result Value Ref Range   Bacterial vaginitis Negative for Bacterial Vaginitis Microorganisms     Comment: Normal Reference Range - Negative   Candida vaginitis Negative for Candida species     Comment: Normal Reference Range - Negative   Chlamydia Negative     Comment: Normal Reference Range - Negative   Neisseria gonorrhea Negative     Comment: Normal Reference Range - Negative   Trichomonas Negative     Comment: Normal Reference Range - Negative  POCT urinalysis dipstick     Status: Abnormal   Collection Time: 08/02/18  9:04 AM  Result Value Ref Range   Color, UA yellow    Clarity, UA clear    Glucose, UA Negative Negative   Bilirubin, UA negative    Ketones, UA negative    Spec Grav,  UA 1.010 1.010 - 1.025   Blood, UA trace    pH, UA 6.5 5.0 - 8.0   Protein, UA Negative Negative   Urobilinogen, UA negative (A) 0.2 or 1.0 E.U./dL   Nitrite, UA negative    Leukocytes, UA Trace (A) Negative   Appearance clear    Odor none   Urine Culture     Status: None   Collection Time: 08/02/18  9:29 AM  Result Value Ref Range   MICRO NUMBER: [id number]    SPECIMEN QUALITY: ADEQUATE    Sample Source URINE    STATUS: FINAL    Result:      Single organism less than 10,000 CFU/mL isolated. These organisms, commonly found on external and internal genitalia, are considered colonizers.  No further testing performed.  POCT urinalysis dipstick     Status: Normal   Collection Time: 08/16/18  4:25 PM  Result Value Ref Range   Color, UA yellow    Clarity, UA clear    Glucose, UA Negative Negative   Bilirubin, UA negative    Ketones, UA negative    Spec Grav, UA 1.025 1.010 - 1.025   Blood, UA non-hemolyzed trace    pH, UA 6.5 5.0 - 8.0   Protein, UA Negative Negative   Urobilinogen, UA 0.2 0.2 or 1.0 E.U./dL   Nitrite, UA negative    Leukocytes, UA Negative Negative   Appearance clear    Odor none   FSH     Status: None   Collection Time: 10/04/18  1:57 PM  Result Value Ref Range   FSH 34.2 mIU/mL    Comment:                     Adult Female:                       Follicular phase      3.5 -  12.5                       Ovulation phase       4.7 -  21.5                       Luteal phase          1.7 -   7.7                       Postmenopausal       25.8 - 134.8      PHQ2/9: Depression screen [facility] 2/9 10/17/2018 09/25/2018 08/02/2018 07/10/2018 08/22/2017  Decreased Interest 2 0 0 1 0  Down, Depressed, Hopeless 2 0 0 1 0  PHQ - 2 Score 4 0 0 2 0  Altered sleeping _0 -  Tired, decreased energy _1 -  Change in appetite 2 1 0 3 -  Feeling bad or failure about yourself  0 0 0 0 -  Trouble concentrating 0 0 0 0 -  Moving slowly or fidgety/restless 0 0 0 0 -  Suicidal thoughts 0 0 0 0 -  PHQ-9 Score _2 -  Difficult doing work/chores - - Not difficult at all Somewhat difficult -    Fall Risk: Fall Risk  10/17/2018 09/25/2018 08/02/2018 07/10/2018 11/22/2017  Falls in the past year? 0 No No No No  Risk for fall due to : - - - - -  Functional Status Survey: Is the patient deaf or have difficulty hearing?: No Does the patient have difficulty seeing, even when wearing glasses/contacts?: No Does the patient have difficulty concentrating, remembering, or making decisions?: No Does the patient have difficulty walking or climbing stairs?: No Does the  patient have difficulty dressing or bathing?: No Does the patient have difficulty doing errands alone such as visiting a doctor's office or shopping?: No  Assessment & Plan  1. Well woman exam   2. Lipid screening  - Lipid panel  3. B12 deficiency  - Vitamin B12  4. Other iron deficiency anemia  - CBC with Differential/Platelet - Iron, TIBC and Ferritin Panel  5. Screening for diabetes mellitus  - Hemoglobin A1c  6. Long-term use of high-risk medication  - COMPLETE METABOLIC PANEL WITH GFR  7. Post menopausal syndrome  - venlafaxine XR (EFFEXOR XR) 37.5 MG 24 hr capsule; Take 1 capsule (37.5 mg total) by mouth daily with breakfast.  Dispense: 60 capsule; Refill: 0  8. Moderate episode of recurrent major depressive disorder (HCC)  - venlafaxine XR (EFFEXOR XR) 37.5 MG 24 hr capsule; Take 1 capsule (37.5 mg total) by mouth daily with breakfast.  Dispense: 60 capsule; Refill: 0  -USPSTF grade A and B recommendations reviewed with patient; age-appropriate recommendations, preventive care, screening tests, etc discussed and encouraged; healthy living encouraged; see AVS for patient education given to patient -Discussed importance of 150 minutes of physical activity weekly, eat two servings of fish weekly, eat one serving of tree nuts ( cashews, pistachios, pecans, almonds.Marland Kitchen) every other day, eat 6 servings of fruit/vegetables daily and drink plenty of water and avoid sweet beverages.

## 2018-10-18 LAB — HEMOGLOBIN A1C
Hgb A1c MFr Bld: 5.5 % of total Hgb (ref <5.7)
Mean Plasma Glucose: 111 (calc)
eAG (mmol/L): 6.2 (calc)

## 2018-10-18 LAB — HEPATITIS C ANTIBODY
Hepatitis C Ab: NONREACTIVE
SIGNAL TO CUT-OFF: 0.01 (ref <1.00)

## 2018-10-18 LAB — CBC WITH DIFFERENTIAL/PLATELET
BASOS ABS: 40 {cells}/uL (ref 0–200)
Basophils Relative: 0.8 %
EOS PCT: 0.8 %
Eosinophils Absolute: 40 cells/uL (ref 15–500)
HEMATOCRIT: 32.5 % — AB (ref 35.0–45.0)
Hemoglobin: 10.8 g/dL — ABNORMAL LOW (ref 11.7–15.5)
LYMPHS ABS: 1820 {cells}/uL (ref 850–3900)
MCH: 29.8 pg (ref 27.0–33.0)
MCHC: 33.2 g/dL (ref 32.0–36.0)
MCV: 89.5 fL (ref 80.0–100.0)
MPV: 11.9 fL (ref 7.5–12.5)
Monocytes Relative: 6.8 %
NEUTROS ABS: 2760 {cells}/uL (ref 1500–7800)
NEUTROS PCT: 55.2 %
PLATELETS: 187 10*3/uL (ref 140–400)
RBC: 3.63 10*6/uL — AB (ref 3.80–5.10)
RDW: 11.9 % (ref 11.0–15.0)
TOTAL LYMPHOCYTE: 36.4 %
WBC mixed population: 340 cells/uL (ref 200–950)
WBC: 5 10*3/uL (ref 3.8–10.8)

## 2018-10-18 LAB — COMPLETE METABOLIC PANEL WITH GFR
AG RATIO: 2 (calc) (ref 1.0–2.5)
ALT: 11 U/L (ref 6–29)
AST: 14 U/L (ref 10–35)
Albumin: 4.3 g/dL (ref 3.6–5.1)
Alkaline phosphatase (APISO): 45 U/L (ref 33–115)
BILIRUBIN TOTAL: 1.4 mg/dL — AB (ref 0.2–1.2)
BUN: 11 mg/dL (ref 7–25)
CALCIUM: 9 mg/dL (ref 8.6–10.2)
CO2: 26 mmol/L (ref 20–32)
Chloride: 105 mmol/L (ref 98–110)
Creat: 0.64 mg/dL (ref 0.50–1.10)
GFR, EST AFRICAN AMERICAN: 122 mL/min/{1.73_m2} (ref >=60)
GFR, EST NON AFRICAN AMERICAN: 106 mL/min/{1.73_m2} (ref >=60)
GLOBULIN: 2.2 g/dL (ref 1.9–3.7)
Glucose, Bld: 82 mg/dL (ref 65–99)
Potassium: 3.8 mmol/L (ref 3.5–5.3)
SODIUM: 138 mmol/L (ref 135–146)
Total Protein: 6.5 g/dL (ref 6.1–8.1)

## 2018-10-18 LAB — VITAMIN B12: VITAMIN B 12: 312 pg/mL (ref 200–1100)

## 2018-10-18 LAB — LIPID PANEL
Cholesterol: 183 mg/dL (ref <200)
HDL: 62 mg/dL (ref >50)
LDL CHOLESTEROL (CALC): 108 mg/dL — AB
NON-HDL CHOLESTEROL (CALC): 121 mg/dL (ref <130)
TRIGLYCERIDES: 51 mg/dL (ref <150)
Total CHOL/HDL Ratio: 3 (calc) (ref <5.0)

## 2018-10-18 LAB — IRON,TIBC AND FERRITIN PANEL
%SAT: 32 % (calc) (ref 16–45)
Ferritin: 37 ng/mL (ref 16–232)
Iron: 107 ug/dL (ref 40–190)
TIBC: 331 mcg/dL (calc) (ref 250–450)

## 2018-10-23 ENCOUNTER — Other Ambulatory Visit: Payer: Self-pay | Admitting: Family Medicine

## 2018-10-23 ENCOUNTER — Telehealth: Payer: Self-pay

## 2018-10-23 DIAGNOSIS — D649 Anemia, unspecified: Secondary | ICD-10-CM

## 2018-10-23 DIAGNOSIS — J4540 Moderate persistent asthma, uncomplicated: Secondary | ICD-10-CM

## 2018-10-23 DIAGNOSIS — D508 Other iron deficiency anemias: Secondary | ICD-10-CM

## 2018-10-23 NOTE — Telephone Encounter (Signed)
Pt returned call, and states she is ok with a referral to specialist re: anemia.

## 2018-10-23 NOTE — Telephone Encounter (Signed)
Copied from [place] [phone]. Topic: General - Other >> Oct 22, 2018  9:34 AM Jessica Carroll wrote:  Pt said she read her labs on my chart but would like a call back as to why she need to see a blood doctor

## 2018-10-23 NOTE — Telephone Encounter (Signed)
I left a message on her machine She has been anemic for a long time , mild and since iron studies normal I recommend further investigation to find out the cause of anemia

## 2018-11-02 ENCOUNTER — Inpatient Hospital Stay: Payer: BLUE CROSS/BLUE SHIELD | Admitting: Oncology

## 2018-11-22 ENCOUNTER — Encounter: Payer: BLUE CROSS/BLUE SHIELD | Admitting: Obstetrics and Gynecology

## 2018-11-26 ENCOUNTER — Other Ambulatory Visit: Payer: Self-pay | Admitting: Family Medicine

## 2018-11-26 DIAGNOSIS — K5909 Other constipation: Secondary | ICD-10-CM

## 2018-11-26 NOTE — Telephone Encounter (Signed)
Copied from [place] [phone]. Topic: Quick Communication - Rx Refill/Question >> Nov 26, 2018  3:00 PM Margot Ables wrote: Medication: LINZESS 72 MCG capsule  - pt needing refill - she is due for a refill on 12/04/18. She wants to have filled before 12/12/2018 because her insurance is requiring prior authorization and clinical documentation from the doctor. She is asking if we can go ahead and get authorization at the beginning of the year so her refills don't get delayed Has the patient contacted their pharmacy?no - due to letter from insurance on [place] Preferred Pharmacy (with phone number or street name): [place] [place] [phone] (Phone) [phone] (Fax)

## 2018-11-30 MED ORDER — LINACLOTIDE 72 MCG PO CAPS: ORAL_CAPSULE | ORAL | 1 refills | Status: DC

## 2018-12-02 ENCOUNTER — Other Ambulatory Visit: Payer: Self-pay | Admitting: Family Medicine

## 2018-12-02 DIAGNOSIS — K5909 Other constipation: Secondary | ICD-10-CM

## 2018-12-04 ENCOUNTER — Encounter: Payer: Self-pay | Admitting: Obstetrics and Gynecology

## 2018-12-04 ENCOUNTER — Ambulatory Visit (INDEPENDENT_AMBULATORY_CARE_PROVIDER_SITE_OTHER): Payer: BLUE CROSS/BLUE SHIELD | Admitting: Obstetrics and Gynecology

## 2018-12-04 VITALS — BP 107/62 | HR 65 | Ht 65.0 in | Wt 120.7 lb

## 2018-12-04 DIAGNOSIS — R61 Generalized hyperhidrosis: Secondary | ICD-10-CM

## 2018-12-04 DIAGNOSIS — R4586 Emotional lability: Secondary | ICD-10-CM | POA: Diagnosis not present

## 2018-12-04 DIAGNOSIS — N951 Menopausal and female climacteric states: Secondary | ICD-10-CM | POA: Diagnosis not present

## 2018-12-04 NOTE — Progress Notes (Signed)
HPI:      [name] is a 47 y.o. G0P0000 who LMP was Patient's last menstrual period was 07/11/2017 (exact date).  Subjective:   She presents today after being diagnosed with menopause and beginning on 1 mg of estradiol daily.  She reports that she still occasionally has hot flashes but they are much more rare.  She states it has not helped her sleep and she still has mood changes.  She does report the vaginal dryness is resolving.  She would like to try a higher dose.    Hx: The following portions of the patient's history were reviewed and updated as appropriate:             She  has a past medical history of Anxiety, Asthma (2011), Depression, Diffuse cystic mastopathy, Endometriosis, GERD (gastroesophageal reflux disease), and Headache. She does not have any pertinent problems on file. She  has a past surgical history that includes Upper gi endoscopy (07/19/2012); Corneal transplant (Right, 01/07/2013); eyelid surgery (Right, June 19, 2015); Laparoscopic excision and fulguration of endometriosis (N/A); Laparoscopic bilateral salpingectomy (N/A, 01/25/2016); Eye surgery; Vaginal hysterectomy (N/A, 07/31/2017); Colonoscopy with propofol (N/A, 09/29/2017); Esophagogastroduodenoscopy (egd) with propofol (N/A, 09/29/2017); and Abdominal hysterectomy. Her family history includes Diabetes in her father; Heart failure (age of onset: 54) in her father; Hyperlipidemia in her mother; Hypertension in her mother. She was adopted. She  reports that she has never smoked. She has never used smokeless tobacco. She reports current alcohol use. She reports that she does not use drugs. She has a current medication list which includes the following prescription(s): albuterol, alprazolam, breo ellipta, dexilant, estradiol, ibuprofen, ketotifen, linzess, multiple vitamin, prednisolone acetate, promethazine, rizatriptan, terconazole, travoprost (bak free), triamcinolone cream, valacyclovir, and venlafaxine xr. She  is allergic to bactrim [sulfamethoxazole-trimethoprim] and penicillins.       Review of Systems:  Review of Systems  Constitutional: Denied constitutional symptoms, night sweats, recent illness, fatigue, fever, insomnia and weight loss.  Eyes: Denied eye symptoms, eye pain, photophobia, vision change and visual disturbance.  Ears/Nose/Throat/Neck: Denied ear, nose, throat or neck symptoms, hearing loss, nasal discharge, sinus congestion and sore throat.  Cardiovascular: Denied cardiovascular symptoms, arrhythmia, chest pain/pressure, edema, exercise intolerance, orthopnea and palpitations.  Respiratory: Denied pulmonary symptoms, asthma, pleuritic pain, productive sputum, cough, dyspnea and wheezing.  Gastrointestinal: Denied, gastro-esophageal reflux, melena, nausea and vomiting.  Genitourinary: Denied genitourinary symptoms including symptomatic vaginal discharge, pelvic relaxation issues, and urinary complaints.  Musculoskeletal: Denied musculoskeletal symptoms, stiffness, swelling, muscle weakness and myalgia.  Dermatologic: Denied dermatology symptoms, rash and scar.  Neurologic: Denied neurology symptoms, dizziness, headache, neck pain and syncope.  Psychiatric: Denied psychiatric symptoms, anxiety and depression.  Endocrine: Denied endocrine symptoms including hot flashes and night sweats.   Meds:   Current Outpatient Medications on File Prior to Visit  Medication Sig Dispense Refill  . albuterol (PROVENTIL HFA) 108 (90 Base) MCG/ACT inhaler Inhale 2 puffs into the lungs every 6 (six) hours as needed for wheezing or shortness of breath. 1 Inhaler 1  . ALPRAZolam (XANAX) 0.5 MG tablet Take 1 tablet (0.5 mg total) by mouth 2 (two) times daily as needed for anxiety. 20 tablet 0  . BREO ELLIPTA 100-25 MCG/INH AEPB INHALE 1 PUFF INTO LUNGS EVERY DAY 60 each 5  . DEXILANT 60 MG capsule Take 1 capsule (60 mg total) by mouth daily. 30 capsule 5  . estradiol (ESTRACE) 1 MG tablet Take 1  tablet (1 mg total) by mouth daily. 30 tablet 2  .  ibuprofen (ADVIL,MOTRIN) 800 MG tablet Take 1 tablet (800 mg total) every 8 (eight) hours as needed by mouth. 30 tablet 1  . ketotifen (ZADITOR) 0.025 % ophthalmic solution Place 1 drop into the right eye daily.    Marland Kitchen LINZESS 72 MCG capsule TAKE 1 CAPSULE(72 MCG) BY MOUTH DAILY BEFORE BREAKFAST 30 capsule 1  . MULTIPLE VITAMIN PO Take 1 tablet by mouth daily.    . prednisoLONE acetate (PRED FORTE) 1 % ophthalmic suspension Place 1 drop into the right eye daily.     . promethazine (PHENERGAN) 12.5 MG tablet Take 1 tablet (12.5 mg total) by mouth every 8 (eight) hours as needed for nausea or vomiting. 20 tablet 0  . rizatriptan (MAXALT) 10 MG tablet Take 1 tablet (10 mg total) by mouth 2 (two) times daily as needed. 10 tablet 2  . terconazole (TERAZOL 7) 0.4 % vaginal cream Place 1 applicator vaginally at bedtime. X 5 nights 45 g 0  . Travoprost, BAK Free, (TRAVATAN) 0.004 % SOLN ophthalmic solution Place 1 drop into the right eye at bedtime.    . triamcinolone cream (KENALOG) 0.1 % Apply 1 application topically 2 (two) times daily as needed. 30 g 0  . valACYclovir (VALTREX) 500 MG tablet Take 1 tablet by mouth 2 (two) times daily.  0  . venlafaxine XR (EFFEXOR XR) 37.5 MG 24 hr capsule Take 1 capsule (37.5 mg total) by mouth daily with breakfast. 60 capsule 0   No current facility-administered medications on file prior to visit.     Objective:     Vitals:   12/04/18 0832  BP: 107/62  Pulse: 65                Assessment:    G0P0000 Patient Active Problem List   Diagnosis Date Noted  . Type A blood, Rh positive 08/22/2017  . S/P vaginal hysterectomy 07/31/2017  . Recurrent vaginitis 01/10/2017  . Hemorrhoids, external without complications 16/12/930  . Nausea in adult patient 02/24/2016  . Endometriosis determined by laparoscopy 01/25/2016  . Chronic constipation 08/14/2015  . Monilial vaginitis 08/14/2015  . Bilateral ganglion  cysts of wrists 08/14/2015  . Mild mitral regurgitation 05/19/2015  . B12 deficiency 05/18/2015  . Asthma, moderate 05/18/2015  . Anxiety 05/18/2015  . Insomnia, persistent 05/18/2015  . Eczema of hand 05/18/2015  . Viral keratitis 05/18/2015  . Migraine without aura and without status migrainosus, not intractable 05/18/2015  . Numerous moles 05/18/2015  . Allergic rhinitis 05/18/2015  . History of corneal transplant 05/18/2015  . Gastroesophageal reflux disease without esophagitis 12/16/2009     1. Symptomatic menopausal or female climacteric states   2. Night sweats   3. Mood changes     Patient has seen market improvement in her night sweats but would like to try a higher dose to help sleep as well as mood changes.   Plan:            1.  She would like to try higher dose of estrogen and see if it helps alleviate symptoms further.  I have advised her to use 1.5 mg of estradiol.  She will contact us if this is working and we will renew a larger prescription if necessary. Orders No orders of the defined types were placed in this encounter.   No orders of the defined types were placed in this encounter.     F/U  Return in about 10 months (around 10/05/2019) for Pt to contact us if symptoms worsen.  I spent 17 minutes involved in the care of this patient of which greater than 50% was spent discussing menopausal symptoms, her specific symptoms of menopause, the use of hormone replacement therapy and menopause, effect of hormones on endometriosis.  All questions answered.  Finis Bud, M.D. 12/04/2018 8:57 AM

## 2018-12-04 NOTE — Progress Notes (Signed)
Patient comes in today for a follow up on her vasomotor symptoms. Patient states that her symptoms are getting better. She has a hot flash a couple times a week. Patient states that she has been having recurrent yeast infections. No symptoms today. She had a dose of the diflucan last Wednesday that cleared up the symptoms of yeast.

## 2018-12-06 ENCOUNTER — Other Ambulatory Visit: Payer: Self-pay

## 2018-12-06 ENCOUNTER — Encounter: Payer: Self-pay | Admitting: Oncology

## 2018-12-06 ENCOUNTER — Inpatient Hospital Stay: Payer: BLUE CROSS/BLUE SHIELD | Attending: Oncology | Admitting: Oncology

## 2018-12-06 ENCOUNTER — Inpatient Hospital Stay: Payer: BLUE CROSS/BLUE SHIELD

## 2018-12-06 VITALS — BP 121/74 | HR 60 | Temp 97.6°F | Ht 66.0 in | Wt 119.0 lb

## 2018-12-06 DIAGNOSIS — R110 Nausea: Secondary | ICD-10-CM | POA: Insufficient documentation

## 2018-12-06 DIAGNOSIS — F329 Major depressive disorder, single episode, unspecified: Secondary | ICD-10-CM | POA: Insufficient documentation

## 2018-12-06 DIAGNOSIS — Z79899 Other long term (current) drug therapy: Secondary | ICD-10-CM | POA: Insufficient documentation

## 2018-12-06 DIAGNOSIS — K5909 Other constipation: Secondary | ICD-10-CM

## 2018-12-06 DIAGNOSIS — R109 Unspecified abdominal pain: Secondary | ICD-10-CM | POA: Diagnosis not present

## 2018-12-06 DIAGNOSIS — N809 Endometriosis, unspecified: Secondary | ICD-10-CM | POA: Insufficient documentation

## 2018-12-06 DIAGNOSIS — R5383 Other fatigue: Secondary | ICD-10-CM | POA: Insufficient documentation

## 2018-12-06 DIAGNOSIS — E538 Deficiency of other specified B group vitamins: Secondary | ICD-10-CM | POA: Diagnosis not present

## 2018-12-06 DIAGNOSIS — K219 Gastro-esophageal reflux disease without esophagitis: Secondary | ICD-10-CM | POA: Diagnosis not present

## 2018-12-06 DIAGNOSIS — D649 Anemia, unspecified: Secondary | ICD-10-CM | POA: Diagnosis not present

## 2018-12-06 DIAGNOSIS — G4700 Insomnia, unspecified: Secondary | ICD-10-CM | POA: Insufficient documentation

## 2018-12-06 DIAGNOSIS — J45909 Unspecified asthma, uncomplicated: Secondary | ICD-10-CM | POA: Diagnosis not present

## 2018-12-06 LAB — CBC WITH DIFFERENTIAL/PLATELET
Abs Immature Granulocytes: 0.01 10*3/uL (ref 0.00–0.07)
Basophils Absolute: 0.1 10*3/uL (ref 0.0–0.1)
Basophils Relative: 1 %
Eosinophils Absolute: 0.1 10*3/uL (ref 0.0–0.5)
Eosinophils Relative: 2 %
HCT: 36.6 % (ref 36.0–46.0)
Hemoglobin: 11.8 g/dL — ABNORMAL LOW (ref 12.0–15.0)
Immature Granulocytes: 0 %
Lymphocytes Relative: 35 %
Lymphs Abs: 1.8 10*3/uL (ref 0.7–4.0)
MCH: 30 pg (ref 26.0–34.0)
MCHC: 32.2 g/dL (ref 30.0–36.0)
MCV: 93.1 fL (ref 80.0–100.0)
Monocytes Absolute: 0.3 10*3/uL (ref 0.1–1.0)
Monocytes Relative: 6 %
NEUTROS PCT: 56 %
Neutro Abs: 3 10*3/uL (ref 1.7–7.7)
PLATELETS: 210 10*3/uL (ref 150–400)
RBC: 3.93 MIL/uL (ref 3.87–5.11)
RDW: 12 % (ref 11.5–15.5)
WBC: 5.3 10*3/uL (ref 4.0–10.5)
nRBC: 0 % (ref 0.0–0.2)

## 2018-12-06 LAB — SEDIMENTATION RATE: Sed Rate: 9 mm/hr (ref 0–20)

## 2018-12-06 LAB — COMPREHENSIVE METABOLIC PANEL
ALT: 15 U/L (ref 0–44)
ANION GAP: 7 (ref 5–15)
AST: 18 U/L (ref 15–41)
Albumin: 4.6 g/dL (ref 3.5–5.0)
Alkaline Phosphatase: 44 U/L (ref 38–126)
BUN: 13 mg/dL (ref 6–20)
CO2: 27 mmol/L (ref 22–32)
Calcium: 9.6 mg/dL (ref 8.9–10.3)
Chloride: 104 mmol/L (ref 98–111)
Creatinine, Ser: 0.65 mg/dL (ref 0.44–1.00)
GFR calc Af Amer: >60 mL/min (ref >60)
GFR calc non Af Amer: >60 mL/min (ref >60)
Glucose, Bld: 87 mg/dL (ref 70–99)
Potassium: 4.3 mmol/L (ref 3.5–5.1)
Sodium: 138 mmol/L (ref 135–145)
Total Bilirubin: 1.2 mg/dL (ref 0.3–1.2)
Total Protein: 7.8 g/dL (ref 6.5–8.1)

## 2018-12-06 LAB — RETICULOCYTES
IMMATURE RETIC FRACT: 4.1 % (ref 2.3–15.9)
RBC.: 3.93 MIL/uL (ref 3.87–5.11)
RETIC COUNT ABSOLUTE: 47.2 10*3/uL (ref 19.0–186.0)
Retic Ct Pct: 1.2 % (ref 0.4–3.1)

## 2018-12-06 LAB — IRON AND TIBC
Iron: 69 ug/dL (ref 28–170)
SATURATION RATIOS: 18 % (ref 10.4–31.8)
TIBC: 384 ug/dL (ref 250–450)
UIBC: 315 ug/dL

## 2018-12-06 LAB — FERRITIN: Ferritin: 24 ng/mL (ref 11–307)

## 2018-12-06 LAB — TSH: TSH: 0.81 u[IU]/mL (ref 0.350–4.500)

## 2018-12-06 LAB — FOLATE: Folate: 10.5 ng/mL (ref >5.9)

## 2018-12-06 LAB — LACTATE DEHYDROGENASE: LDH: 140 U/L (ref 98–192)

## 2018-12-06 NOTE — Progress Notes (Signed)
Hematology/Oncology Consult note [facility] Telephone:([phone] [phone]  Patient Care Team: Steele Sizer, MD as PCP - General (Family Medicine) Bary Castilla, Forest Gleason, MD (General Surgery) Hubbard Hartshorn, FNP as Nurse Practitioner (Family Medicine)   Name of the patient: Jessica Carroll  [id number]  Apr 30, 1971    Reason for referral- normocytic anemia   Referring physician- Dr. Ancil Boozer  Date of visit: 12/06/18   History of presenting illness-patient is a 47 year old female with a past medical history significant for reflux anxiety, and other medical problems.  She has been referred to [place] for normocytic anemia.  Her hemoglobin has been around 11-11.5 over the last 3 years.  Her most recent CBC from 10/17/2018 showed white count of 5, H&H of 10.8/32.5 with an MCV of 89.5 and a platelet count of 187.  B12 levels were low normal at 312.  Iron studies were normal except for a low normal ferritin of 37.  Patient states that she has chronic intermittent nausea and abdominal pain which makes it difficult for her to eat.  She has seen Dr. Vicente Males from GI in the past and underwent EGD and colonoscopy as well.  She reports feeling fatigued.  Denies other complaints her appetite is good and her weight has fluctuated a little but overall remained stable  ECOG PS- 0  Pain scale- 0   Review of systems- Review of Systems  Constitutional: Positive for malaise/fatigue. Negative for chills, fever and weight loss.  HENT: Negative for congestion, ear discharge and nosebleeds.   Eyes: Negative for blurred vision.  Respiratory: Negative for cough, hemoptysis, sputum production, shortness of breath and wheezing.   Cardiovascular: Negative for chest pain, palpitations, orthopnea and claudication.  Gastrointestinal: Negative for abdominal pain, blood in stool, constipation, diarrhea, heartburn, melena, nausea and vomiting.  Genitourinary: Negative for dysuria, flank pain,  frequency, hematuria and urgency.  Musculoskeletal: Negative for back pain, joint pain and myalgias.  Skin: Negative for rash.  Neurological: Negative for dizziness, tingling, focal weakness, seizures, weakness and headaches.  Endo/Heme/Allergies: Does not bruise/bleed easily.  Psychiatric/Behavioral: Negative for depression and suicidal ideas. The patient does not have insomnia.     Allergies  Allergen Reactions  . Bactrim [Sulfamethoxazole-Trimethoprim] Nausea Only  . Penicillins Nausea Only    Patient Active Problem List   Diagnosis Date Noted  . Type A blood, Rh positive 08/22/2017  . S/P vaginal hysterectomy 07/31/2017  . Recurrent vaginitis 01/10/2017  . Hemorrhoids, external without complications 46/50/3546  . Nausea in adult patient 02/24/2016  . Endometriosis determined by laparoscopy 01/25/2016  . Chronic constipation 08/14/2015  . Monilial vaginitis 08/14/2015  . Bilateral ganglion cysts of wrists 08/14/2015  . Mild mitral regurgitation 05/19/2015  . B12 deficiency 05/18/2015  . Asthma, moderate 05/18/2015  . Anxiety 05/18/2015  . Insomnia, persistent 05/18/2015  . Eczema of hand 05/18/2015  . Viral keratitis 05/18/2015  . Migraine without aura and without status migrainosus, not intractable 05/18/2015  . Numerous moles 05/18/2015  . Allergic rhinitis 05/18/2015  . History of corneal transplant 05/18/2015  . Gastroesophageal reflux disease without esophagitis 12/16/2009     Past Medical History:  Diagnosis Date  . Anxiety   . Asthma 2011  . Depression   . Diffuse cystic mastopathy   . Endometriosis   . GERD (gastroesophageal reflux disease)   . Headache      Past Surgical History:  Procedure Laterality Date  . ABDOMINAL HYSTERECTOMY    . COLONOSCOPY WITH PROPOFOL N/A 09/29/2017   Procedure:  COLONOSCOPY WITH PROPOFOL;  Surgeon: Jonathon Bellows, MD;  Location: [facility] ENDOSCOPY;  Service: Gastroenterology;  Laterality: N/A;  . CORNEAL TRANSPLANT Right  01/07/2013  . ESOPHAGOGASTRODUODENOSCOPY (EGD) WITH PROPOFOL N/A 09/29/2017   Procedure: ESOPHAGOGASTRODUODENOSCOPY (EGD) WITH PROPOFOL;  Surgeon: Jonathon Bellows, MD;  Location: [facility] ENDOSCOPY;  Service: Gastroenterology;  Laterality: N/A;  . EYE SURGERY    . eyelid surgery Right June 19, 2015   [facility] by Dr. Norlene Duel  . LAPAROSCOPIC BILATERAL SALPINGECTOMY N/A 01/25/2016   Procedure: LAPAROSCOPIC BILATERAL SALPINGECTOMY, ADHESIOLYSIS, LAPAROSCOPIC EXCISION/FULGERATION ENDOMETRIOSIS ;  Surgeon: [name], MD;  Location: [facility] ORS;  Service: Gynecology;  Laterality: N/A;  . Laparoscopic excision and fulguration of endometriosis N/A    bladder flap and uterosacral ligament endometriosis, excised and cauterized  . UPPER GI ENDOSCOPY  07/19/2012  . VAGINAL HYSTERECTOMY N/A 07/31/2017   Procedure: HYSTERECTOMY VAGINAL;  Surgeon: [name], MD;  Location: [facility] ORS;  Service: Gynecology;  Laterality: N/A;    Social History   Socioeconomic History  . Marital status: Single    Spouse name: Not on file  . Number of children: 0  . Years of education: 12  . Highest education level: High school graduate  Occupational History  . Occupation: Education officer, community  . Financial resource strain: Patient refused  . Food insecurity:    Worry: Never true    Inability: Never true  . Transportation needs:    Medical: No    Non-medical: No  Tobacco Use  . Smoking status: Never Smoker  . Smokeless tobacco: Never Used  Substance and Sexual Activity  . Alcohol use: Yes    Alcohol/week: 0.0 standard drinks    Comment: occ. wine  . Drug use: No  . Sexual activity: Yes    Partners: Male    Birth control/protection: None, Surgical  Lifestyle  . Physical activity:    Days per week: 5 days    Minutes per session: 150+ min  . Stress: To some extent  Relationships  . Social connections:    Talks on phone: More than three times a week    Gets together: Once a week    Attends  religious service: More than 4 times per year    Active member of club or organization: No    Attends meetings of clubs or organizations: Never    Relationship status: Never married  . Intimate partner violence:    Fear of current or ex partner: No    Emotionally abused: No    Physically abused: No    Forced sexual activity: No  Other Topics Concern  . Not on file  Social History Narrative   Engaged to get married to her boyfriend of 14 years.     Family History  Adopted: Yes  Problem Relation Age of Onset  . Hyperlipidemia Mother   . Hypertension Mother   . Diabetes Father   . Heart failure Father 71       hear attack  . Cancer Neg Hx   . Breast cancer Neg Hx      Current Outpatient Medications:  .  albuterol (PROVENTIL HFA) 108 (90 Base) MCG/ACT inhaler, Inhale 2 puffs into the lungs every 6 (six) hours as needed for wheezing or shortness of breath., Disp: 1 Inhaler, Rfl: 1 .  ALPRAZolam (XANAX) 0.5 MG tablet, Take 1 tablet (0.5 mg total) by mouth 2 (two) times daily as needed for anxiety., Disp: 20 tablet, Rfl: 0 .  BREO ELLIPTA 100-25 MCG/INH  AEPB, INHALE 1 PUFF INTO LUNGS EVERY DAY, Disp: 60 each, Rfl: 5 .  DEXILANT 60 MG capsule, Take 1 capsule (60 mg total) by mouth daily., Disp: 30 capsule, Rfl: 5 .  estradiol (ESTRACE) 1 MG tablet, Take 1 tablet (1 mg total) by mouth daily., Disp: 30 tablet, Rfl: 2 .  ibuprofen (ADVIL,MOTRIN) 800 MG tablet, Take 1 tablet (800 mg total) every 8 (eight) hours as needed by mouth., Disp: 30 tablet, Rfl: 1 .  ketotifen (ZADITOR) 0.025 % ophthalmic solution, Place 1 drop into the right eye daily., Disp: , Rfl:  .  LINZESS 72 MCG capsule, TAKE 1 CAPSULE(72 MCG) BY MOUTH DAILY BEFORE BREAKFAST, Disp: 30 capsule, Rfl: 1 .  MULTIPLE VITAMIN PO, Take 1 tablet by mouth daily., Disp: , Rfl:  .  prednisoLONE acetate (PRED FORTE) 1 % ophthalmic suspension, Place 1 drop into the right eye daily. , Disp: , Rfl:  .  promethazine (PHENERGAN) 12.5 MG  tablet, Take 1 tablet (12.5 mg total) by mouth every 8 (eight) hours as needed for nausea or vomiting., Disp: 20 tablet, Rfl: 0 .  rizatriptan (MAXALT) 10 MG tablet, Take 1 tablet (10 mg total) by mouth 2 (two) times daily as needed., Disp: 10 tablet, Rfl: 2 .  terconazole (TERAZOL 7) 0.4 % vaginal cream, Place 1 applicator vaginally at bedtime. X 5 nights, Disp: 45 g, Rfl: 0 .  Travoprost, BAK Free, (TRAVATAN) 0.004 % SOLN ophthalmic solution, Place 1 drop into the right eye at bedtime., Disp: , Rfl:  .  triamcinolone cream (KENALOG) 0.1 %, Apply 1 application topically 2 (two) times daily as needed., Disp: 30 g, Rfl: 0 .  valACYclovir (VALTREX) 500 MG tablet, Take 1 tablet by mouth 2 (two) times daily., Disp: , Rfl: 0 .  venlafaxine XR (EFFEXOR XR) 37.5 MG 24 hr capsule, Take 1 capsule (37.5 mg total) by mouth daily with breakfast., Disp: 60 capsule, Rfl: 0   Physical exam:  Vitals:   12/06/18 1004  BP: 121/74  Pulse: 60  Temp: 97.6 F (36.4 C)  TempSrc: Tympanic  Weight: 119 lb (54 kg)  Height: '5\' 6"'  (1.676 m)   Physical Exam Constitutional:      Comments: Thin female in no acute distress  HENT:     Head: Normocephalic and atraumatic.  Eyes:     Pupils: Pupils are equal, round, and reactive to light.  Neck:     Musculoskeletal: Normal range of motion.  Cardiovascular:     Rate and Rhythm: Normal rate and regular rhythm.     Heart sounds: Normal heart sounds.  Pulmonary:     Effort: Pulmonary effort is normal.     Breath sounds: Normal breath sounds.  Abdominal:     General: Bowel sounds are normal. There is no distension.     Palpations: Abdomen is soft.     Tenderness: There is no abdominal tenderness.  Lymphadenopathy:     Comments: No palpable cervical, supraclavicular, axillary or inguinal adenopathy   Skin:    General: Skin is warm and dry.  Neurological:     Mental Status: She is alert and oriented to person, place, and time.        CMP Latest Ref Rng &  Units 12/06/2018  Glucose 70 - 99 mg/dL 87  BUN 6 - 20 mg/dL 13  Creatinine 0.44 - 1.00 mg/dL 0.65  Sodium 135 - 145 mmol/L 138  Potassium 3.5 - 5.1 mmol/L 4.3  Chloride 98 - 111 mmol/L 104  CO2 22 - 32 mmol/L 27  Calcium 8.9 - 10.3 mg/dL 9.6  Total Protein 6.5 - 8.1 g/dL 7.8  Total Bilirubin 0.3 - 1.2 mg/dL 1.2  Alkaline Phos 38 - 126 U/L 44  AST 15 - 41 U/L 18  ALT 0 - 44 U/L 15   CBC Latest Ref Rng & Units 12/06/2018  WBC 4.0 - 10.5 K/uL 5.3  Hemoglobin 12.0 - 15.0 g/dL 11.8(L)  Hematocrit 36.0 - 46.0 % 36.6  Platelets 150 - 400 K/uL 210     Assessment and plan- Patient is a 47 y.o. female referred for normocytic anemia  Patient's baseline hemoglobin over the last 3 years has been around 11-11.5.  Her most recent CBC from November 2019 revealed a hemoglobin of 10.8 which was normocytic.  Her iron studies were normal except for a low normal ferritin of 32 and a borderline low B12 level of 312.  She reports she has not been taking her oral B12 consistently and would ideally like to go back to her monthly B12 shots with Dr. Ancil Boozer.  At this time I will do a comprehensive anemia work-up including CBC, CMP, ferritin and iron studies, B12 and folate, reticulocyte count, haptoglobin, myeloma panel, TSH and ESR.  I will see the patient back in 2 to 3 weeks time to discuss the results of her blood work and further management   Patient may also want to follow-up for Dr. Vicente Males given that her GI symptoms including intermittent nausea and abdominal pain still persist   Thank you for this kind referral and the opportunity to participate in the care of this patient   Visit Diagnosis 1. Normocytic anemia     Dr. Randa Evens, MD, MPH [facility] [phone] 12/06/2018 3:02 PM

## 2018-12-06 NOTE — Progress Notes (Signed)
Patient stated that she has mid abdominal pain usually after she eats. Patient also stated that she had lost weight since last week-she was 124 lbs last week and today she is 119 lbs. Patient also stated that she has nausea but no vomiting. Patient stays tired and fatigued everyday.

## 2018-12-07 LAB — HAPTOGLOBIN: Haptoglobin: 116 mg/dL (ref 42–296)

## 2018-12-10 LAB — MULTIPLE MYELOMA PANEL, SERUM
ALBUMIN SERPL ELPH-MCNC: 4.2 g/dL (ref 2.9–4.4)
Albumin/Glob SerPl: 1.6 (ref 0.7–1.7)
Alpha 1: 0.1 g/dL (ref 0.0–0.4)
Alpha2 Glob SerPl Elph-Mcnc: 0.7 g/dL (ref 0.4–1.0)
B-Globulin SerPl Elph-Mcnc: 1 g/dL (ref 0.7–1.3)
Gamma Glob SerPl Elph-Mcnc: 0.9 g/dL (ref 0.4–1.8)
Globulin, Total: 2.8 g/dL (ref 2.2–3.9)
IgA: 148 mg/dL (ref 87–352)
IgG (Immunoglobin G), Serum: 1045 mg/dL (ref 700–1600)
IgM (Immunoglobulin M), Srm: 79 mg/dL (ref 26–217)
Total Protein ELP: 7 g/dL (ref 6.0–8.5)

## 2018-12-18 ENCOUNTER — Inpatient Hospital Stay: Payer: BLUE CROSS/BLUE SHIELD | Attending: Oncology | Admitting: Oncology

## 2018-12-18 ENCOUNTER — Encounter: Payer: Self-pay | Admitting: *Deleted

## 2018-12-18 ENCOUNTER — Encounter: Payer: Self-pay | Admitting: Oncology

## 2018-12-18 VITALS — BP 92/59 | HR 64 | Temp 98.1°F | Resp 17 | Wt 122.4 lb

## 2018-12-18 DIAGNOSIS — Z79899 Other long term (current) drug therapy: Secondary | ICD-10-CM | POA: Diagnosis not present

## 2018-12-18 DIAGNOSIS — K219 Gastro-esophageal reflux disease without esophagitis: Secondary | ICD-10-CM

## 2018-12-18 DIAGNOSIS — D509 Iron deficiency anemia, unspecified: Secondary | ICD-10-CM

## 2018-12-18 DIAGNOSIS — R5383 Other fatigue: Secondary | ICD-10-CM | POA: Diagnosis not present

## 2018-12-18 DIAGNOSIS — R109 Unspecified abdominal pain: Secondary | ICD-10-CM | POA: Diagnosis not present

## 2018-12-18 DIAGNOSIS — D649 Anemia, unspecified: Secondary | ICD-10-CM | POA: Diagnosis not present

## 2018-12-18 DIAGNOSIS — F329 Major depressive disorder, single episode, unspecified: Secondary | ICD-10-CM | POA: Diagnosis not present

## 2018-12-18 DIAGNOSIS — R110 Nausea: Secondary | ICD-10-CM | POA: Diagnosis not present

## 2018-12-18 NOTE — Progress Notes (Signed)
Patient here for Anemia. Complains of fatigue and low energy. Patient has some mild chest pain on Left side described as aching this has been present for about 1 week. BP is low today 92/59 HR 64.

## 2018-12-19 ENCOUNTER — Ambulatory Visit: Payer: Self-pay | Admitting: *Deleted

## 2018-12-19 DIAGNOSIS — D509 Iron deficiency anemia, unspecified: Secondary | ICD-10-CM | POA: Insufficient documentation

## 2018-12-19 NOTE — Addendum Note (Signed)
Addended by: Randa Evens C on: 12/19/2018 11:26 AM   Modules accepted: Orders

## 2018-12-19 NOTE — Progress Notes (Signed)
Hematology/Oncology Consult note [facility]  [phone] [phone]  Patient Care Team: Steele Sizer, MD as PCP - General (Family Medicine) Bary Castilla, Forest Gleason, MD (General Surgery) Hubbard Hartshorn, FNP as Nurse Practitioner (Family Medicine)   Name of the patient: Jessica Carroll  [id number]  08-26-1971   Date of visit: 12/19/18  Diagnosis-normocytic anemia likely secondary to iron deficiency  Chief complaint/ Reason for visit-discuss results of blood work  Heme/Onc history: patient is a 48 year old female with a past medical history significant for reflux anxiety, and other medical problems.  She has been referred to [place] for normocytic anemia.  Her hemoglobin has been around 11-11.5 over the last 3 years.  Her most recent CBC from 10/17/2018 showed white count of 5, H&H of 10.8/32.5 with an MCV of 89.5 and a platelet count of 187.  B12 levels were low normal at 312.  Iron studies were normal except for a low normal ferritin of 37.  Patient states that she has chronic intermittent nausea and abdominal pain which makes it difficult for her to eat.  She has seen Dr. Vicente Males from GI in the past and underwent EGD and colonoscopy as well.  She reports feeling fatigued.  Denies other complaints her appetite is good and her weight has fluctuated a little but overall remained stable  Results of blood work from 12/06/2018 were as follows:CBC showed white count of 5.3, H&H of 11.8/36.6 and a platelet count of 210.  CMP was within normal limits.  Folate and reticulocyte count was normal.  TSH LDH and haptoglobin was normal.  Myeloma panel showed no M protein.  Ferritin levels were low at 24.  Iron studies were normal.  ESR was normal at 9.  Interval history- she reports chronic fatigue. Hs intermittent nausea and abdominal pain  ECOG PS- 0 Pain scale- 0   Review of systems- Review of Systems  Constitutional: Negative for chills, fever, malaise/fatigue  and weight loss.  HENT: Negative for congestion, ear discharge and nosebleeds.   Eyes: Negative for blurred vision.  Respiratory: Negative for cough, hemoptysis, sputum production, shortness of breath and wheezing.   Cardiovascular: Negative for chest pain, palpitations, orthopnea and claudication.  Gastrointestinal: Negative for abdominal pain, blood in stool, constipation, diarrhea, heartburn, melena, nausea and vomiting.  Genitourinary: Negative for dysuria, flank pain, frequency, hematuria and urgency.  Musculoskeletal: Negative for back pain, joint pain and myalgias.  Skin: Negative for rash.  Neurological: Negative for dizziness, tingling, focal weakness, seizures, weakness and headaches.  Endo/Heme/Allergies: Does not bruise/bleed easily.  Psychiatric/Behavioral: Negative for depression and suicidal ideas. The patient does not have insomnia.       Allergies  Allergen Reactions  . Bactrim [Sulfamethoxazole-Trimethoprim] Nausea Only  . Penicillins Nausea Only     Past Medical History:  Diagnosis Date  . Anxiety   . Asthma 2011  . Depression   . Diffuse cystic mastopathy   . Endometriosis   . GERD (gastroesophageal reflux disease)   . Headache      Past Surgical History:  Procedure Laterality Date  . ABDOMINAL HYSTERECTOMY    . COLONOSCOPY WITH PROPOFOL N/A 09/29/2017   Procedure: COLONOSCOPY WITH PROPOFOL;  Surgeon: Jonathon Bellows, MD;  Location: [facility] ENDOSCOPY;  Service: Gastroenterology;  Laterality: N/A;  . CORNEAL TRANSPLANT Right 01/07/2013  . ESOPHAGOGASTRODUODENOSCOPY (EGD) WITH PROPOFOL N/A 09/29/2017   Procedure: ESOPHAGOGASTRODUODENOSCOPY (EGD) WITH PROPOFOL;  Surgeon: Jonathon Bellows, MD;  Location: [facility] ENDOSCOPY;  Service: Gastroenterology;  Laterality: N/A;  .  EYE SURGERY    . eyelid surgery Right June 19, 2015   [place] by Dr. Norlene Duel  . LAPAROSCOPIC BILATERAL SALPINGECTOMY N/A 01/25/2016   Procedure: LAPAROSCOPIC BILATERAL SALPINGECTOMY, ADHESIOLYSIS,  LAPAROSCOPIC EXCISION/FULGERATION ENDOMETRIOSIS ;  Surgeon: [name], MD;  Location: [facility] ORS;  Service: Gynecology;  Laterality: N/A;  . Laparoscopic excision and fulguration of endometriosis N/A    bladder flap and uterosacral ligament endometriosis, excised and cauterized  . UPPER GI ENDOSCOPY  07/19/2012  . VAGINAL HYSTERECTOMY N/A 07/31/2017   Procedure: HYSTERECTOMY VAGINAL;  Surgeon: [name], MD;  Location: [facility] ORS;  Service: Gynecology;  Laterality: N/A;    Social History   Socioeconomic History  . Marital status: Single    Spouse name: Not on file  . Number of children: 0  . Years of education: 34  . Highest education level: High school graduate  Occupational History  . Occupation: Education officer, community  . Financial resource strain: Patient refused  . Food insecurity:    Worry: Never true    Inability: Never true  . Transportation needs:    Medical: No    Non-medical: No  Tobacco Use  . Smoking status: Never Smoker  . Smokeless tobacco: Never Used  Substance and Sexual Activity  . Alcohol use: Not Currently    Alcohol/week: 0.0 standard drinks    Comment: occ. wine  . Drug use: No  . Sexual activity: Yes    Partners: Male    Birth control/protection: None, Surgical  Lifestyle  . Physical activity:    Days per week: 5 days    Minutes per session: 150+ min  . Stress: To some extent  Relationships  . Social connections:    Talks on phone: More than three times a week    Gets together: Once a week    Attends religious service: More than 4 times per year    Active member of club or organization: No    Attends meetings of clubs or organizations: Never    Relationship status: Never married  . Intimate partner violence:    Fear of current or ex partner: No    Emotionally abused: No    Physically abused: No    Forced sexual activity: No  Other Topics Concern  . Not on file  Social History Narrative   Engaged to get married to her  boyfriend of 14 years.    Family History  Adopted: Yes  Problem Relation Age of Onset  . Hyperlipidemia Mother   . Hypertension Mother   . Diabetes Father   . Heart failure Father 73       hear attack  . Cancer Neg Hx   . Breast cancer Neg Hx      Current Outpatient Medications:  .  albuterol (PROVENTIL HFA) 108 (90 Base) MCG/ACT inhaler, Inhale 2 puffs into the lungs every 6 (six) hours as needed for wheezing or shortness of breath., Disp: 1 Inhaler, Rfl: 1 .  ALPRAZolam (XANAX) 0.5 MG tablet, Take 1 tablet (0.5 mg total) by mouth 2 (two) times daily as needed for anxiety., Disp: 20 tablet, Rfl: 0 .  BREO ELLIPTA 100-25 MCG/INH AEPB, INHALE 1 PUFF INTO LUNGS EVERY DAY, Disp: 60 each, Rfl: 5 .  DEXILANT 60 MG capsule, Take 1 capsule (60 mg total) by mouth daily., Disp: 30 capsule, Rfl: 5 .  estradiol (ESTRACE) 1 MG tablet, Take 1 tablet (1 mg total) by mouth daily., Disp: 30 tablet, Rfl: 2 .  ibuprofen (ADVIL,MOTRIN) 800 MG tablet, Take 1 tablet (800 mg total) every 8 (eight) hours as needed by mouth., Disp: 30 tablet, Rfl: 1 .  ketotifen (ZADITOR) 0.025 % ophthalmic solution, Place 1 drop into the right eye daily., Disp: , Rfl:  .  LINZESS 72 MCG capsule, TAKE 1 CAPSULE(72 MCG) BY MOUTH DAILY BEFORE BREAKFAST, Disp: 30 capsule, Rfl: 1 .  MULTIPLE VITAMIN PO, Take 1 tablet by mouth daily., Disp: , Rfl:  .  prednisoLONE acetate (PRED FORTE) 1 % ophthalmic suspension, Place 1 drop into the right eye daily. , Disp: , Rfl:  .  promethazine (PHENERGAN) 12.5 MG tablet, Take 1 tablet (12.5 mg total) by mouth every 8 (eight) hours as needed for nausea or vomiting., Disp: 20 tablet, Rfl: 0 .  rizatriptan (MAXALT) 10 MG tablet, Take 1 tablet (10 mg total) by mouth 2 (two) times daily as needed., Disp: 10 tablet, Rfl: 2 .  Travoprost, BAK Free, (TRAVATAN) 0.004 % SOLN ophthalmic solution, Place 1 drop into the right eye at bedtime., Disp: , Rfl:  .  triamcinolone cream (KENALOG) 0.1 %, Apply 1  application topically 2 (two) times daily as needed., Disp: 30 g, Rfl: 0 .  valACYclovir (VALTREX) 500 MG tablet, Take 1 tablet by mouth 2 (two) times daily., Disp: , Rfl: 0 .  venlafaxine XR (EFFEXOR XR) 37.5 MG 24 hr capsule, Take 1 capsule (37.5 mg total) by mouth daily with breakfast., Disp: 60 capsule, Rfl: 0  Physical exam:  Vitals:   12/18/18 1025  BP: (!) 92/59  Pulse: 64  Resp: 17  Temp: 98.1 F (36.7 C)  TempSrc: Tympanic  Weight: 122 lb 6.4 oz (55.5 kg)   Physical Exam Constitutional:      General: She is not in acute distress. HENT:     Head: Normocephalic and atraumatic.  Eyes:     Pupils: Pupils are equal, round, and reactive to light.  Neck:     Musculoskeletal: Normal range of motion.  Cardiovascular:     Rate and Rhythm: Normal rate and regular rhythm.     Heart sounds: Normal heart sounds.  Pulmonary:     Effort: Pulmonary effort is normal.     Breath sounds: Normal breath sounds.  Abdominal:     General: Bowel sounds are normal.     Palpations: Abdomen is soft.  Skin:    General: Skin is warm and dry.  Neurological:     Mental Status: She is alert and oriented to person, place, and time.      CMP Latest Ref Rng & Units 12/06/2018  Glucose 70 - 99 mg/dL 87  BUN 6 - 20 mg/dL 13  Creatinine 0.44 - 1.00 mg/dL 0.65  Sodium 135 - 145 mmol/L 138  Potassium 3.5 - 5.1 mmol/L 4.3  Chloride 98 - 111 mmol/L 104  CO2 22 - 32 mmol/L 27  Calcium 8.9 - 10.3 mg/dL 9.6  Total Protein 6.5 - 8.1 g/dL 7.8  Total Bilirubin 0.3 - 1.2 mg/dL 1.2  Alkaline Phos 38 - 126 U/L 44  AST 15 - 41 U/L 18  ALT 0 - 44 U/L 15   CBC Latest Ref Rng & Units 12/06/2018  WBC 4.0 - 10.5 K/uL 5.3  Hemoglobin 12.0 - 15.0 g/dL 11.8(L)  Hematocrit 36.0 - 46.0 % 36.6  Platelets 150 - 400 K/uL 210      Assessment and plan- Patient is a 48 y.o. female referred for normocytic anemia  I discussed the results of the blood  work with the patient in detail which were essentially  unremarkable except for a low ferritin of 24.  She is mildly anemic with a hemoglobin of 11.8.  She has had problems tolerating oral iron in the past as it gives her terrible constipation.  Discussed that we could try IV iron Feraheme 510 mg x 2 doses.  Discussed risks and benefits of Feraheme including all but not limited to headaches, leg swelling and possible risk of infusion reaction.  Also explained that improving her ferritin levels may may not improve her fatigue.  Patient is willing to proceed with a trial of Feraheme at this time.  We will schedule her for 2 doses of Feraheme.  I will see her back in 3 months time with repeat CBC ferritin iron studies  Patient will follow up with GI soon for her ongoing symptoms of nausea and abdominal pain   Visit Diagnosis 1. Normocytic anemia      Dr. Randa Evens, MD, MPH [facility] [phone] 12/19/2018 10:57 AM

## 2018-12-19 NOTE — Telephone Encounter (Signed)
Pt called with having some chest discomfort that has been going on for about a week. Denies any cardiac symptoms. Not chest pain, no nausea, sweating or weakness.  She lift a heavy bag of trash recently. Sometimes when she turns the wrong way she can feel it in her back. Feels like a sore muscle.  [facility] at [facility] notified regarding appointment and getting medication. Appointment scheduled for tomorrow per protocol. Pt advised to call back for increase in symptoms. Pt voiced understanding.  Reason for Disposition . [1] MODERATE pain (e.g., interferes with normal activities) AND [2] present > 3 days  Answer Assessment - Initial Assessment Questions 1. ONSET: "When did the muscle aches or body pains start?"  About a week 2. LOCATION: "What part of your body is hurting?" (e.g., entire body, arms, legs)      Chest to the left and sometimes goes to her back 3. SEVERITY: "How bad is the pain?" (Scale 1-10; or mild, moderate, severe)   - MILD (1-3): doesn't interfere with normal activities    - MODERATE (4-7): interferes with normal activities or awakens from sleep    - SEVERE (8-10):  excruciating pain, unable to do any normal activities      Uncomfortable # 4 or 5 4. CAUSE: "What do you think is causing the pains?"     Lifting a heavy bag of trash out 5. FEVER: "Have you been having fever?"     no 6. OTHER SYMPTOMS: "Do you have any other symptoms?" (e.g., chest pain, weakness, rash, cold or flu symptoms, weight loss)     no 7. PREGNANCY: "Is there any chance you are pregnant?" "When was your last menstrual period?"     No had hysterectomy 8. TRAVEL: "Have you traveled out of the country in the last month?" (e.g., travel history, exposures)     no  Protocols used: MUSCLE ACHES AND BODY PAIN-A-AH

## 2018-12-20 ENCOUNTER — Encounter: Payer: Self-pay | Admitting: Family Medicine

## 2018-12-20 ENCOUNTER — Other Ambulatory Visit: Payer: Self-pay | Admitting: Family Medicine

## 2018-12-20 ENCOUNTER — Ambulatory Visit: Payer: BLUE CROSS/BLUE SHIELD | Admitting: Family Medicine

## 2018-12-20 VITALS — BP 96/60 | HR 70 | Temp 97.7°F | Resp 16 | Ht 66.0 in | Wt 122.3 lb

## 2018-12-20 DIAGNOSIS — F331 Major depressive disorder, recurrent, moderate: Secondary | ICD-10-CM | POA: Diagnosis not present

## 2018-12-20 DIAGNOSIS — N951 Menopausal and female climacteric states: Secondary | ICD-10-CM | POA: Diagnosis not present

## 2018-12-20 DIAGNOSIS — M75102 Unspecified rotator cuff tear or rupture of left shoulder, not specified as traumatic: Secondary | ICD-10-CM

## 2018-12-20 MED ORDER — CYCLOBENZAPRINE HCL 10 MG PO TABS: 5.0000 mg | ORAL_TABLET | Freq: Every day | ORAL | 0 refills | Status: DC

## 2018-12-20 MED ORDER — L-THEANINE 100 MG PO CAPS: 2.0000 | ORAL_CAPSULE | Freq: Every day | ORAL | 0 refills | Status: DC

## 2018-12-20 MED ORDER — MENTHOL (TOPICAL ANALGESIC) 4 % EX GEL: 2.0000 g | Freq: Four times a day (QID) | CUTANEOUS | 0 refills | Status: DC

## 2018-12-20 NOTE — Patient Instructions (Signed)
Shoulder Impingement Syndrome Rehab  Ask your health care provider which exercises are safe for you. Do exercises exactly as told by your health care provider and adjust them as directed. It is normal to feel mild stretching, pulling, tightness, or discomfort as you do these exercises, but you should stop right away if you feel sudden pain or your pain gets worse. Do not begin these exercises until told by your health care provider.  Stretching and range of motion exercise  This exercise warms up your muscles and joints and improves the movement and flexibility of your shoulder. This exercise also helps to relieve pain and stiffness.  Exercise A: Passive horizontal adduction    1. Sit or stand and pull your left / right elbow across your chest, toward your other shoulder. Stop when you feel a gentle stretch in the back of your shoulder and upper arm.  ? Keep your arm at shoulder height.  ? Keep your arm as close to your body as you comfortably can.  2. Hold for __________ seconds.  3. Slowly return to the starting position.  Repeat __________ times. Complete this exercise __________ times a day.  Strengthening exercises  These exercises build strength and endurance in your shoulder. Endurance is the ability to use your muscles for a long time, even after they get tired.  Exercise B: External rotation, isometric  1. Stand or sit in a doorway, facing the door frame.  2. Bend your left / right elbow and place the back of your wrist against the door frame. Only your wrist should be touching the frame. Keep your upper arm at your side.  3. Gently press your wrist against the door frame, as if you are trying to push your arm away from your abdomen.  ? Avoid shrugging your shoulder while you press your hand against the door frame. Keep your shoulder blade tucked down toward the middle of your back.  4. Hold for __________ seconds.  5. Slowly release the tension, and relax your muscles completely before you do the exercise  again.  Repeat __________ times. Complete this exercise __________ times a day.  Exercise C: Internal rotation, isometric    1. Stand or sit in a doorway, facing the door frame.  2. Bend your left / right elbow and place the inside of your wrist against the door frame. Only your wrist should be touching the frame. Keep your upper arm at your side.  3. Gently press your wrist against the door frame, as if you are trying to push your arm toward your abdomen.  ? Avoid shrugging your shoulder while you press your hand against the door frame. Keep your shoulder blade tucked down toward the middle of your back.  4. Hold for __________ seconds.  5. Slowly release the tension, and relax your muscles completely before you do the exercise again.  Repeat __________ times. Complete this exercise __________ times a day.  Exercise D: Scapular protraction, supine    1. Lie on your back on a firm surface. Hold a __________ weight in your left / right hand.  2. Raise your left / right arm straight into the air so your hand is directly above your shoulder joint.  3. Push the weight into the air so your shoulder lifts off of the surface that you are lying on. Do not move your head, neck, or back.  4. Hold for __________ seconds.  5. Slowly return to the starting position. Let your muscles relax completely before   you repeat this exercise.  Repeat __________ times. Complete this exercise __________ times a day.  Exercise E: Scapular retraction    1. Sit in a stable chair without armrests, or stand.  2. Secure an exercise band to a stable object in front of you so the band is at shoulder height.  3. Hold one end of the exercise band in each hand. Your palms should face down.  4. Squeeze your shoulder blades together and move your elbows slightly behind you. Do not shrug your shoulders while you do this.  5. Hold for __________ seconds.  6. Slowly return to the starting position.  Repeat __________ times. Complete this exercise __________  times a day.  Exercise F: Shoulder extension    1. Sit in a stable chair without armrests, or stand.  2. Secure an exercise band to a stable object in front of you where the band is above shoulder height.  3. Hold one end of the exercise band in each hand.  4. Straighten your elbows and lift your hands up to shoulder height.  5. Squeeze your shoulder blades together and pull your hands down to the sides of your thighs. Stop when your hands are straight down by your sides. Do not let your hands go behind your body.  6. Hold for __________ seconds.  7. Slowly return to the starting position.  Repeat __________ times. Complete this exercise __________ times a day.  This information is not intended to replace advice given to you by your health care provider. Make sure you discuss any questions you have with your health care provider.  Document Released: 11/28/2005 Document Revised: 08/04/2016 Document Reviewed: 10/31/2015  [employer] Interactive Patient Education © 2019 [employer]

## 2018-12-20 NOTE — Progress Notes (Signed)
Name: Jessica Carroll   MRN: [id number]    DOB: Oct 01, 1971   Date:12/20/2018       Progress Note  Subjective  Chief Complaint  Chief Complaint  Patient presents with  . Muscle Pain    back and chest pain x 1 week that comes and goes and is worse at night and radiates.    HPI  Rotator cuff irritation: she works a part time job Tax inspector and last week while picking up a heavy trash bag she developed acute onset of pain on left posterior shoulder. Pain is described as aching, on left anterior chest wall and pain on left scapular area, pain worse at night and also with palpation of scapular area and with rom of left shoulder, no redness or rash. Heat and ibuprofen helps a little with symptoms. No weakness. No sob or diaphoresis or nausea.   Anemia: seeing Dr. Janese Banks and going for iron infusion tomorrow  Major Depression/post-menopausal: she was having mood changes and on her last visit we gave her Effexor, however she spoke to a friend and decided to get otc supplements, has GABA and L-theanine , she is feeling better, phq 9 has improved and wants to hold off taking rx medication. She has a long history of depression, just got worse last fall with symptoms of post-menopausal  Patient Active Problem List   Diagnosis Date Noted  . Iron deficiency anemia 12/19/2018  . Type A blood, Rh positive 08/22/2017  . S/P vaginal hysterectomy 07/31/2017  . Recurrent vaginitis 01/10/2017  . Hemorrhoids, external without complications 07/37/1062  . Nausea in adult patient 02/24/2016  . Endometriosis determined by laparoscopy 01/25/2016  . Chronic constipation 08/14/2015  . Monilial vaginitis 08/14/2015  . Bilateral ganglion cysts of wrists 08/14/2015  . Mild mitral regurgitation 05/19/2015  . B12 deficiency 05/18/2015  . Asthma, moderate 05/18/2015  . Anxiety 05/18/2015  . Insomnia, persistent 05/18/2015  . Eczema of hand 05/18/2015  . Viral keratitis 05/18/2015  . Migraine without aura and  without status migrainosus, not intractable 05/18/2015  . Numerous moles 05/18/2015  . Allergic rhinitis 05/18/2015  . History of corneal transplant 05/18/2015  . Gastroesophageal reflux disease without esophagitis 12/16/2009    Past Surgical History:  Procedure Laterality Date  . ABDOMINAL HYSTERECTOMY    . COLONOSCOPY WITH PROPOFOL N/A 09/29/2017   Procedure: COLONOSCOPY WITH PROPOFOL;  Surgeon: Jonathon Bellows, MD;  Location: [facility] ENDOSCOPY;  Service: Gastroenterology;  Laterality: N/A;  . CORNEAL TRANSPLANT Right 01/07/2013  . ESOPHAGOGASTRODUODENOSCOPY (EGD) WITH PROPOFOL N/A 09/29/2017   Procedure: ESOPHAGOGASTRODUODENOSCOPY (EGD) WITH PROPOFOL;  Surgeon: Jonathon Bellows, MD;  Location: [facility] ENDOSCOPY;  Service: Gastroenterology;  Laterality: N/A;  . EYE SURGERY    . eyelid surgery Right June 19, 2015   [facility] by Dr. Norlene Duel  . LAPAROSCOPIC BILATERAL SALPINGECTOMY N/A 01/25/2016   Procedure: LAPAROSCOPIC BILATERAL SALPINGECTOMY, ADHESIOLYSIS, LAPAROSCOPIC EXCISION/FULGERATION ENDOMETRIOSIS ;  Surgeon: [name], MD;  Location: [facility] ORS;  Service: Gynecology;  Laterality: N/A;  . Laparoscopic excision and fulguration of endometriosis N/A    bladder flap and uterosacral ligament endometriosis, excised and cauterized  . UPPER GI ENDOSCOPY  07/19/2012  . VAGINAL HYSTERECTOMY N/A 07/31/2017   Procedure: HYSTERECTOMY VAGINAL;  Surgeon: [name], MD;  Location: [facility] ORS;  Service: Gynecology;  Laterality: N/A;    Family History  Adopted: Yes  Problem Relation Age of Onset  . Hyperlipidemia Mother   . Hypertension Mother   . Diabetes Father   . Heart failure  Father 7       hear attack  . Cancer Neg Hx   . Breast cancer Neg Hx     Social History   Socioeconomic History  . Marital status: Single    Spouse name: Not on file  . Number of children: 0  . Years of education: 26  . Highest education level: High school graduate  Occupational History  .  Occupation: Education officer, community  . Financial resource strain: Patient refused  . Food insecurity:    Worry: Never true    Inability: Never true  . Transportation needs:    Medical: No    Non-medical: No  Tobacco Use  . Smoking status: Never Smoker  . Smokeless tobacco: Never Used  Substance and Sexual Activity  . Alcohol use: Not Currently    Alcohol/week: 0.0 standard drinks    Comment: occ. wine  . Drug use: No  . Sexual activity: Yes    Partners: Male    Birth control/protection: None, Surgical  Lifestyle  . Physical activity:    Days per week: 5 days    Minutes per session: 150+ min  . Stress: To some extent  Relationships  . Social connections:    Talks on phone: More than three times a week    Gets together: Once a week    Attends religious service: More than 4 times per year    Active member of club or organization: No    Attends meetings of clubs or organizations: Never    Relationship status: Never married  . Intimate partner violence:    Fear of current or ex partner: No    Emotionally abused: No    Physically abused: No    Forced sexual activity: No  Other Topics Concern  . Not on file  Social History Narrative   Engaged to get married to her boyfriend of 14 years.     Current Outpatient Medications:  .  albuterol (PROVENTIL HFA) 108 (90 Base) MCG/ACT inhaler, Inhale 2 puffs into the lungs every 6 (six) hours as needed for wheezing or shortness of breath., Disp: 1 Inhaler, Rfl: 1 .  ALPRAZolam (XANAX) 0.5 MG tablet, Take 1 tablet (0.5 mg total) by mouth 2 (two) times daily as needed for anxiety., Disp: 20 tablet, Rfl: 0 .  BREO ELLIPTA 100-25 MCG/INH AEPB, INHALE 1 PUFF INTO THE LUNGS EVERY DAY, Disp: 60 each, Rfl: 5 .  DEXILANT 60 MG capsule, Take 1 capsule (60 mg total) by mouth daily., Disp: 30 capsule, Rfl: 5 .  estradiol (ESTRACE) 1 MG tablet, Take 1 tablet (1 mg total) by mouth daily., Disp: 30 tablet, Rfl: 2 .  ibuprofen (ADVIL,MOTRIN) 800 MG  tablet, Take 1 tablet (800 mg total) every 8 (eight) hours as needed by mouth., Disp: 30 tablet, Rfl: 1 .  ketotifen (ZADITOR) 0.025 % ophthalmic solution, Place 1 drop into the right eye daily., Disp: , Rfl:  .  LINZESS 72 MCG capsule, TAKE 1 CAPSULE(72 MCG) BY MOUTH DAILY BEFORE BREAKFAST, Disp: 30 capsule, Rfl: 1 .  MULTIPLE VITAMIN PO, Take 1 tablet by mouth daily., Disp: , Rfl:  .  prednisoLONE acetate (PRED FORTE) 1 % ophthalmic suspension, Place 1 drop into the right eye daily. , Disp: , Rfl:  .  promethazine (PHENERGAN) 12.5 MG tablet, Take 1 tablet (12.5 mg total) by mouth every 8 (eight) hours as needed for nausea or vomiting., Disp: 20 tablet, Rfl: 0 .  rizatriptan (MAXALT) 10 MG tablet, Take 1  tablet (10 mg total) by mouth 2 (two) times daily as needed., Disp: 10 tablet, Rfl: 2 .  Travoprost, BAK Free, (TRAVATAN) 0.004 % SOLN ophthalmic solution, Place 1 drop into the right eye at bedtime., Disp: , Rfl:  .  triamcinolone cream (KENALOG) 0.1 %, Apply 1 application topically 2 (two) times daily as needed., Disp: 30 g, Rfl: 0 .  valACYclovir (VALTREX) 500 MG tablet, Take 1 tablet by mouth 2 (two) times daily., Disp: , Rfl: 0 .  venlafaxine XR (EFFEXOR XR) 37.5 MG 24 hr capsule, Take 1 capsule (37.5 mg total) by mouth daily with breakfast., Disp: 60 capsule, Rfl: 0  Allergies  Allergen Reactions  . Bactrim [Sulfamethoxazole-Trimethoprim] Nausea Only  . Penicillins Nausea Only    I personally reviewed active problem list, medication list, allergies, family history, social history with the patient/caregiver today.   ROS  Constitutional: Negative for fever or weight change.  Respiratory: Negative for cough and shortness of breath.   Cardiovascular: Negative for chest pain or palpitations.  Gastrointestinal: Negative for abdominal pain, no bowel changes.  Musculoskeletal: Negative for gait problem or joint swelling.  Skin: Negative for rash.  Neurological: Negative for dizziness or  headache.  No other specific complaints in a complete review of systems (except as listed in HPI above).  Objective  Vitals:   12/20/18 0845  BP: 96/60  Pulse: 70  Resp: 16  Temp: 97.7 F (36.5 C)  TempSrc: Oral  SpO2: 100%  Weight: 122 lb 4.8 oz (55.5 kg)  Height: '5\' 6"'  (1.676 m)    Body mass index is 19.74 kg/m.  Physical Exam  Constitutional: Patient appears well-developed and well-nourished.  No distress.  HEENT: head atraumatic, normocephalic, pupils equal and reactive to light, neck supple, throat within normal limits Cardiovascular: Normal rate, regular rhythm and normal heart sounds.  No murmur heard. No BLE edema. Pulmonary/Chest: Effort normal and breath sounds normal. No respiratory distress. Abdominal: Soft.  There is no tenderness. Muscular Skeletal: pain on palpation of left scapular area and over trapezium muscle, also soreness to touch on left pectoris muscle, pain with rom of left shoulder but no impingement sign. Normal grip  Psychiatric: Patient has a normal mood and affect. behavior is normal. Judgment and thought content normal.  Recent Results (from the past 2160 hour(s))  [place]     Status: None   Collection Time: 10/04/18  1:57 PM  Result Value Ref Range   FSH 34.2 mIU/mL    Comment:                     Adult Female:                       Follicular phase      3.5 -  12.5                       Ovulation phase       4.7 -  21.5                       Luteal phase          1.7 -   7.7                       Postmenopausal       25.8 - 134.8   Hemoglobin A1c     Status: None   Collection Time: 10/17/18  10:58 AM  Result Value Ref Range   Hgb A1c MFr Bld 5.5 <5.7 % of total Hgb    Comment: For the purpose of screening for the presence of diabetes: . <5.7%       Consistent with the absence of diabetes 5.7-6.4%    Consistent with increased risk for diabetes             (prediabetes) > or =6.5%  Consistent with diabetes . This assay result is  consistent with a decreased risk of diabetes. . Currently, no consensus exists regarding use of hemoglobin A1c for diagnosis of diabetes in children. . According to American Diabetes Association (ADA) guidelines, hemoglobin A1c <7.0% represents optimal control in non-pregnant diabetic patients. Different metrics may apply to specific patient populations.  Standards of Medical Care in Diabetes(ADA). .    Mean Plasma Glucose 111 (calc)   eAG (mmol/L) 6.2 (calc)  Lipid panel     Status: Abnormal   Collection Time: 10/17/18 10:58 AM  Result Value Ref Range   Cholesterol 183 <200 mg/dL   HDL 62 >50 mg/dL   Triglycerides 51 <150 mg/dL   LDL Cholesterol (Calc) 108 (H) mg/dL (calc)    Comment: Reference range: <100 . Desirable range <100 mg/dL for primary prevention;   <70 mg/dL for patients with CHD or diabetic patients  with > or = 2 CHD risk factors. Marland Kitchen LDL-C is now calculated using the Martin-Hopkins  calculation, which is a validated novel method providing  better accuracy than the Friedewald equation in the  estimation of LDL-C.  Cresenciano Genre et al. Annamaria Helling. 0938;182(99): 2061-2068  (http://education.[facility].com/faq/FAQ164)    Total CHOL/HDL Ratio 3.0 <5.0 (calc)   Non-HDL Cholesterol (Calc) 121 <130 mg/dL (calc)    Comment: For patients with diabetes plus 1 major ASCVD risk  factor, treating to a non-HDL-C goal of <100 mg/dL  (LDL-C of <70 mg/dL) is considered a therapeutic  option.   Vitamin B12     Status: None   Collection Time: 10/17/18 10:58 AM  Result Value Ref Range   Vitamin B-12 312 200 - 1,100 pg/mL    Comment: . Please Note: Although the reference range for vitamin B12 is [phone] pg/mL, it has been reported that between 5 and 10% of patients with values between 200 and 400 pg/mL may experience neuropsychiatric and hematologic abnormalities due to occult B12 deficiency; less than 1% of patients with values above 400 pg/mL will have symptoms. .   CBC  with Differential/Platelet     Status: Abnormal   Collection Time: 10/17/18 10:58 AM  Result Value Ref Range   WBC 5.0 3.8 - 10.8 Thousand/uL   RBC 3.63 (L) 3.80 - 5.10 Million/uL   Hemoglobin 10.8 (L) 11.7 - 15.5 g/dL   HCT 32.5 (L) 35.0 - 45.0 %   MCV 89.5 80.0 - 100.0 fL   MCH 29.8 27.0 - 33.0 pg   MCHC 33.2 32.0 - 36.0 g/dL   RDW 11.9 11.0 - 15.0 %   Platelets 187 140 - 400 Thousand/uL   MPV 11.9 7.5 - 12.5 fL   Neutro Abs 2,760 1,500 - 7,800 cells/uL   Lymphs Abs 1,820 850 - 3,900 cells/uL   WBC mixed population 340 200 - 950 cells/uL   Eosinophils Absolute 40 15 - 500 cells/uL   Basophils Absolute 40 0 - 200 cells/uL   Neutrophils Relative % 55.2 %   Total Lymphocyte 36.4 %   Monocytes Relative 6.8 %   Eosinophils Relative 0.8 %  Basophils Relative 0.8 %  COMPLETE METABOLIC PANEL WITH GFR     Status: Abnormal   Collection Time: 10/17/18 10:58 AM  Result Value Ref Range   Glucose, Bld 82 65 - 99 mg/dL    Comment: .            Fasting reference interval .    BUN 11 7 - 25 mg/dL   Creat 0.64 0.50 - 1.10 mg/dL   GFR, Est Non African American 106 > OR = 60 mL/min/1.56m   GFR, Est African American 122 > OR = 60 mL/min/1.750m  BUN/Creatinine Ratio NOT APPLICABLE 6 - 22 (calc)   Sodium 138 135 - 146 mmol/L   Potassium 3.8 3.5 - 5.3 mmol/L   Chloride 105 98 - 110 mmol/L   CO2 26 20 - 32 mmol/L   Calcium 9.0 8.6 - 10.2 mg/dL   Total Protein 6.5 6.1 - 8.1 g/dL   Albumin 4.3 3.6 - 5.1 g/dL   Globulin 2.2 1.9 - 3.7 g/dL (calc)   AG Ratio 2.0 1.0 - 2.5 (calc)   Total Bilirubin 1.4 (H) 0.2 - 1.2 mg/dL   Alkaline phosphatase (APISO) 45 33 - 115 U/L   AST 14 10 - 35 U/L   ALT 11 6 - 29 U/L  Iron, TIBC and Ferritin Panel     Status: None   Collection Time: 10/17/18 10:58 AM  Result Value Ref Range   Iron 107 40 - 190 mcg/dL   TIBC 331 250 - 450 mcg/dL (calc)   %SAT 32 16 - 45 % (calc)   Ferritin 37 16 - 232 ng/mL  Hepatitis C antibody     Status: None   Collection  Time: 10/17/18 10:58 AM  Result Value Ref Range   Hepatitis C Ab NON-REACTIVE NON-REACTI   SIGNAL TO CUT-OFF 0.01 <1.00    Comment: . HCV antibody was non-reactive. There is no laboratory  evidence of HCV infection. . In most cases, no further action is required. However, if recent HCV exposure is suspected, a test for HCV RNA (test code 35831-305-4672is suggested. . For additional information please refer to http://education.[facility].com/faq/FAQ22v1 (This link is being provided for informational/ educational purposes only.) .   Sedimentation rate     Status: None   Collection Time: 12/06/18 10:38 AM  Result Value Ref Range   Sed Rate 9 0 - 20 mm/hr    Comment: Performed at [facility] [place] and TIBC     Status: None   Collection Time: 12/06/18 10:38 AM  Result Value Ref Range   Iron 69 28 - 170 ug/dL   TIBC 384 250 - 450 ug/dL   Saturation Ratios 18 10.4 - 31.8 %   UIBC 315 ug/dL    Comment: Performed at [facility] [place] [id number]     Status: None   Collection Time: 12/06/18 10:38 AM  Result Value Ref Range   Ferritin 24 11 - 307 ng/mL    Comment: Performed at [facility] [place], [place] [id number] dehydrogenase     Status: None   Collection Time: 12/06/18 10:38 AM  Result Value Ref Range   LDH 140 98 - 192 U/L    Comment: Performed at [facility] [place] [id number]     Status: None   Collection Time: 12/06/18 10:38 AM  Result Value  Ref Range   TSH 0.810 0.350 - 4.500 uIU/mL    Comment: Performed by a 3rd Generation assay with a functional sensitivity of <=0.01 uIU/mL. Performed at [facility], [address] [place], [place] [address]   Haptoglobin     Status: None   Collection Time: 12/06/18 10:38 AM  Result Value Ref Range   Haptoglobin 116 42 - 296 mg/dL    Comment:  (NOTE)              **Please note reference interval change** Performed At: [facility] [place], [place] [id number] Rush Farmer MD [id number]   Multiple Myeloma Panel (SPEP&IFE w/QIG)     Status: None   Collection Time: 12/06/18 10:38 AM  Result Value Ref Range   IgG (Immunoglobin G), Serum 1,045 700 - 1,600 mg/dL   IgA 148 87 - 352 mg/dL   IgM (Immunoglobulin M), Srm 79 26 - 217 mg/dL   Total Protein ELP 7.0 6.0 - 8.5 g/dL   Albumin SerPl [place]ph-Mcnc 4.2 2.9 - 4.4 g/dL   Alpha 1 0.1 0.0 - 0.4 g/dL   Alpha2 Glob SerPl [place]ph-Mcnc 0.7 0.4 - 1.0 g/dL   B-Globulin SerPl [place]ph-Mcnc 1.0 0.7 - 1.3 g/dL   Gamma Glob SerPl [place]ph-Mcnc 0.9 0.4 - 1.8 g/dL   M Protein SerPl Elph-Mcnc Not Observed Not Observed g/dL   Globulin, Total 2.8 2.2 - 3.9 g/dL   Albumin/Glob SerPl 1.6 0.7 - 1.7   IFE 1 Comment     Comment: An apparent normal immunofixation pattern.   Please Note Comment     Comment: (NOTE) Protein electrophoresis scan will follow via computer, mail, or courier delivery. Performed At: [facility] [place], [place] [id number] Rush Farmer MD [id number]   Reticulocytes     Status: None   Collection Time: 12/06/18 10:38 AM  Result Value Ref Range   Retic Ct Pct 1.2 0.4 - 3.1 %   RBC. 3.93 3.87 - 5.11 MIL/uL   Retic Count, Absolute 47.2 19.0 - 186.0 K/uL   Immature Retic Fract 4.1 2.3 - 15.9 %    Comment: Performed at [facility], [address] [place], [place] [address]  Folate     Status: None   Collection Time: 12/06/18 10:38 AM  Result Value Ref Range   Folate 10.5 >5.9 ng/mL    Comment: Performed at [facility], [address] [place], [place] [address]  Comprehensive metabolic panel     Status: None   Collection Time: 12/06/18 10:38 AM  Result Value Ref Range   Sodium 138 135 - 145 mmol/L   Potassium 4.3 3.5 - 5.1 mmol/L   Chloride 104 98 - 111 mmol/L   CO2 27 22 - 32 mmol/L   Glucose, Bld 87 70 -  99 mg/dL   BUN 13 6 - 20 mg/dL   Creatinine, Ser 0.65 0.44 - 1.00 mg/dL   Calcium 9.6 8.9 - 10.3 mg/dL   Total Protein 7.8 6.5 - 8.1 g/dL   Albumin 4.6 3.5 - 5.0 g/dL   AST 18 15 - 41 U/L   ALT 15 0 - 44 U/L   Alkaline Phosphatase 44 38 - 126 U/L   Total Bilirubin 1.2 0.3 - 1.2 mg/dL   GFR calc non Af Amer >60 >60 mL/min   GFR calc Af Amer >60 >60 mL/min   Anion gap 7 5 - 15    Comment: Performed at [facility], [address],  [place], [place] [address]  CBC with Differential/Platelet     Status: Abnormal   Collection Time: 12/06/18 10:38 AM  Result Value Ref Range   WBC 5.3 4.0 - 10.5 K/uL   RBC 3.93 3.87 - 5.11 MIL/uL   Hemoglobin 11.8 (L) 12.0 - 15.0 g/dL   HCT 36.6 36.0 - 46.0 %   MCV 93.1 80.0 - 100.0 fL   MCH 30.0 26.0 - 34.0 pg   MCHC 32.2 30.0 - 36.0 g/dL   RDW 12.0 11.5 - 15.5 %   Platelets 210 150 - 400 K/uL   nRBC 0.0 0.0 - 0.2 %   Neutrophils Relative % 56 %   Neutro Abs 3.0 1.7 - 7.7 K/uL   Lymphocytes Relative 35 %   Lymphs Abs 1.8 0.7 - 4.0 K/uL   Monocytes Relative 6 %   Monocytes Absolute 0.3 0.1 - 1.0 K/uL   Eosinophils Relative 2 %   Eosinophils Absolute 0.1 0.0 - 0.5 K/uL   Basophils Relative 1 %   Basophils Absolute 0.1 0.0 - 0.1 K/uL   Immature Granulocytes 0 %   Abs Immature Granulocytes 0.01 0.00 - 0.07 K/uL    Comment: Performed at [facility], [place], [place] [address]      PHQ2/9: Depression screen [facility] 2/9 12/20/2018 10/17/2018 09/25/2018 08/02/2018 07/10/2018  Decreased Interest 2 2 0 0 1  Down, Depressed, Hopeless 1 2 0 0 1  PHQ - 2 Score 3 4 0 0 2  Altered sleeping '2 3 2 1 3  ' Tired, decreased energy '2 2 3 1 3  ' Change in appetite '1 2 1 ' 0 3  Feeling bad or failure about yourself  0 0 0 0 0  Trouble concentrating 0 0 0 0 0  Moving slowly or fidgety/restless 0 0 0 0 0  Suicidal thoughts 0 0 0 0 0  PHQ-9 Score '8 11 6 2 11  ' Difficult doing work/chores - - - Not difficult at all Somewhat difficult      Fall Risk: Fall Risk  12/20/2018 12/20/2018 10/17/2018 09/25/2018 08/02/2018  Falls in the past year? 0 0 0 No No  Number falls in past yr: 0 0 - - -  Injury with Fall? 0 0 - - -  Risk for fall due to : - - - - -     Assessment & Plan  1. Rotator cuff syndrome of left shoulder  - cyclobenzaprine (FLEXERIL) 10 MG tablet; Take 0.5-1 tablets (5-10 mg total) by mouth at bedtime.  Dispense: 30 tablet; Refill: 0 - Menthol, Topical Analgesic, (BIOFREEZE) 4 % GEL; Apply 2 g topically 4 (four) times daily.  Dispense: 1 Tube; Refill: 0  2. Post menopausal syndrome  - L-Theanine 100 MG CAPS; Take 2 capsules by mouth daily. With GABA and lemom balm  Dispense: 60 capsule; Refill: 0  3. Moderate episode of recurrent major depressive disorder ([place])  phq 9 has improved, but she is not taking Effexor, stopped on her own because she prefers taking natural supplements

## 2018-12-21 ENCOUNTER — Inpatient Hospital Stay: Payer: BLUE CROSS/BLUE SHIELD

## 2018-12-21 VITALS — BP 109/70 | HR 60 | Temp 98.1°F | Resp 17

## 2018-12-21 DIAGNOSIS — R109 Unspecified abdominal pain: Secondary | ICD-10-CM | POA: Diagnosis not present

## 2018-12-21 DIAGNOSIS — D509 Iron deficiency anemia, unspecified: Secondary | ICD-10-CM

## 2018-12-21 DIAGNOSIS — Z79899 Other long term (current) drug therapy: Secondary | ICD-10-CM | POA: Diagnosis not present

## 2018-12-21 DIAGNOSIS — K219 Gastro-esophageal reflux disease without esophagitis: Secondary | ICD-10-CM | POA: Diagnosis not present

## 2018-12-21 DIAGNOSIS — R5383 Other fatigue: Secondary | ICD-10-CM | POA: Diagnosis not present

## 2018-12-21 DIAGNOSIS — R110 Nausea: Secondary | ICD-10-CM | POA: Diagnosis not present

## 2018-12-21 DIAGNOSIS — F329 Major depressive disorder, single episode, unspecified: Secondary | ICD-10-CM | POA: Diagnosis not present

## 2018-12-21 DIAGNOSIS — D649 Anemia, unspecified: Secondary | ICD-10-CM | POA: Diagnosis not present

## 2018-12-21 MED ORDER — SODIUM CHLORIDE 0.9 % IV SOLN
Freq: Once | INTRAVENOUS | Status: AC
  Administered 2018-12-21: 14:00:00 via INTRAVENOUS
  Filled 2018-12-21: qty 250

## 2018-12-21 MED ORDER — SODIUM CHLORIDE 0.9 % IV SOLN
510.0000 mg | Freq: Once | INTRAVENOUS | Status: AC
  Administered 2018-12-21: 510 mg via INTRAVENOUS
  Filled 2018-12-21: qty 17

## 2018-12-21 NOTE — Progress Notes (Signed)
Pt tolerated infusion well. Pt and VS stable at discharge.  

## 2018-12-26 ENCOUNTER — Other Ambulatory Visit: Payer: Self-pay | Admitting: Surgical

## 2018-12-26 MED ORDER — ESTRADIOL 1 MG PO TABS: ORAL_TABLET | ORAL | 2 refills | Status: DC

## 2018-12-28 ENCOUNTER — Inpatient Hospital Stay: Payer: BLUE CROSS/BLUE SHIELD

## 2018-12-28 ENCOUNTER — Encounter: Payer: Self-pay | Admitting: *Deleted

## 2018-12-28 VITALS — BP 110/69 | HR 63 | Temp 97.6°F | Resp 20

## 2018-12-28 DIAGNOSIS — F329 Major depressive disorder, single episode, unspecified: Secondary | ICD-10-CM | POA: Diagnosis not present

## 2018-12-28 DIAGNOSIS — D509 Iron deficiency anemia, unspecified: Secondary | ICD-10-CM

## 2018-12-28 DIAGNOSIS — R110 Nausea: Secondary | ICD-10-CM | POA: Diagnosis not present

## 2018-12-28 DIAGNOSIS — D649 Anemia, unspecified: Secondary | ICD-10-CM | POA: Diagnosis not present

## 2018-12-28 DIAGNOSIS — R109 Unspecified abdominal pain: Secondary | ICD-10-CM | POA: Diagnosis not present

## 2018-12-28 DIAGNOSIS — R5383 Other fatigue: Secondary | ICD-10-CM | POA: Diagnosis not present

## 2018-12-28 DIAGNOSIS — Z79899 Other long term (current) drug therapy: Secondary | ICD-10-CM | POA: Diagnosis not present

## 2018-12-28 DIAGNOSIS — K219 Gastro-esophageal reflux disease without esophagitis: Secondary | ICD-10-CM | POA: Diagnosis not present

## 2018-12-28 MED ORDER — SODIUM CHLORIDE 0.9 % IV SOLN
Freq: Once | INTRAVENOUS | Status: AC
  Administered 2018-12-28: 14:00:00 via INTRAVENOUS
  Filled 2018-12-28: qty 250

## 2018-12-28 MED ORDER — SODIUM CHLORIDE 0.9 % IV SOLN
510.0000 mg | Freq: Once | INTRAVENOUS | Status: AC
  Administered 2018-12-28: 510 mg via INTRAVENOUS
  Filled 2018-12-28: qty 17

## 2018-12-31 ENCOUNTER — Other Ambulatory Visit: Payer: Self-pay | Admitting: Obstetrics and Gynecology

## 2019-01-07 ENCOUNTER — Other Ambulatory Visit: Payer: Self-pay

## 2019-01-07 ENCOUNTER — Telehealth: Payer: Self-pay | Admitting: Family Medicine

## 2019-01-07 MED ORDER — PLECANATIDE 3 MG PO TABS: 1.0000 | ORAL_TABLET | Freq: Every day | ORAL | 2 refills | Status: DC

## 2019-01-07 NOTE — Telephone Encounter (Signed)
Copied from [place] [id number]. Topic: Quick Communication - See Telephone Encounter >> Jan 07, 2019  4:06 PM Bea Graff, NT wrote: CRM for notification. See Telephone encounter for: 01/07/19. Pt requesting the Plecanatide (TRULANCE) 3 MG TABS to be resent to [facility] in [place]. [place] [id number] - [place], [place] [address] AT [place] [phone] (Phone) [phone] (Fax)   [facility] in [place] cannot transfer med.

## 2019-01-07 NOTE — Telephone Encounter (Signed)
Insurance prefers [employer] over [employer]. Please change

## 2019-01-08 NOTE — Telephone Encounter (Signed)
Patient will have [facility] in [place] in [place] to transfer medication.

## 2019-01-11 ENCOUNTER — Ambulatory Visit: Payer: BLUE CROSS/BLUE SHIELD | Admitting: Family Medicine

## 2019-01-11 ENCOUNTER — Encounter: Payer: Self-pay | Admitting: Family Medicine

## 2019-01-11 VITALS — BP 128/72 | HR 73 | Temp 97.6°F | Resp 16 | Ht 66.0 in | Wt 122.8 lb

## 2019-01-11 DIAGNOSIS — G43009 Migraine without aura, not intractable, without status migrainosus: Secondary | ICD-10-CM

## 2019-01-11 DIAGNOSIS — K5909 Other constipation: Secondary | ICD-10-CM

## 2019-01-11 DIAGNOSIS — J4541 Moderate persistent asthma with (acute) exacerbation: Secondary | ICD-10-CM | POA: Diagnosis not present

## 2019-01-11 DIAGNOSIS — K219 Gastro-esophageal reflux disease without esophagitis: Secondary | ICD-10-CM | POA: Diagnosis not present

## 2019-01-11 DIAGNOSIS — F331 Major depressive disorder, recurrent, moderate: Secondary | ICD-10-CM | POA: Diagnosis not present

## 2019-01-11 DIAGNOSIS — H6692 Otitis media, unspecified, left ear: Secondary | ICD-10-CM

## 2019-01-11 MED ORDER — DOXYCYCLINE HYCLATE 100 MG PO TABS: 100.0000 mg | ORAL_TABLET | Freq: Two times a day (BID) | ORAL | 0 refills | Status: DC

## 2019-01-11 MED ORDER — PREDNISONE 10 MG PO TABS: 10.0000 mg | ORAL_TABLET | Freq: Two times a day (BID) | ORAL | 0 refills | Status: DC

## 2019-01-11 MED ORDER — FLUCONAZOLE 150 MG PO TABS: 150.0000 mg | ORAL_TABLET | ORAL | 0 refills | Status: DC

## 2019-01-11 MED ORDER — DEXILANT 60 MG PO CPDR: 60.0000 mg | DELAYED_RELEASE_CAPSULE | Freq: Every day | ORAL | 5 refills | Status: DC

## 2019-01-11 NOTE — Progress Notes (Signed)
Name: Jessica Carroll   MRN: [id number]    DOB: April 01, 1971   Date:01/11/2019       Progress Note  Subjective  Chief Complaint  Chief Complaint  Patient presents with  . Medication Refill    6 month F/U  . Asthma  . Gastroesophageal Reflux  . Migraine  . Anxiety  . Constipation  . Sore Throat    Onset-1 month, dysphagia, left ear pressure, coughing at night-green and yellow phlegm, nasal congestion. Has tried everything otc    HPI  Asthma:she is now on Breo. She has SOB with moderate activity. Since an URI over the past few weeks her cough is worse, severe at night, and has a green to yellow sputum , no fever or chills. She denies wheezing, unable to do spirometry today   GERD/dysphagia:seen by Dr. Vicente Carroll, had EGD and colonoscopy showed hemorrhoids but normal EGD. She is on Dexilant, lost 4 lbs since last visit. Gallbladder US showed one cyst, normal HiDA scan, she continues to have some bloating after meals. No blood in stools. Symptoms improved since she got iron infusion.   Migraine: no recent episodes Headache is described as throbbing, usually frontal or nuchal, associated with photophobia, and phonophobia. She has nausea, no vomiting with episodes.Taking medication prn Maxalt and promethazine prn   Chronic constipation: she states she has daily bowel movement with medication, no straining or blood in stools. [place] # 3 or 4  with Linzess, however insurance no longer covering medication so we are switching her to [employer] today   Anxiety: she always worrying about something. She is currently very worried about losing her job, her company had a large lay off and is going through another one again. Also worries about her step-son that is in a psychiatric facility / [place] - he has paranoid schizophrenia. He was also addicted to heroine.  URI: she states symptoms started a few weeks ago, boyfriend had a cold, hers started with rhinorrhea and a cough, followed by hoarseness,  fatigue, no fever or chills. She states now cough is productive, and is severe at night, but no wheezing. She is taking otc medication without help    Patient Active Problem List   Diagnosis Date Noted  . Moderate episode of recurrent major depressive disorder ([place]) 12/20/2018  . Iron deficiency anemia 12/19/2018  . Type A blood, Rh positive 08/22/2017  . S/P vaginal hysterectomy 07/31/2017  . Recurrent vaginitis 01/10/2017  . Hemorrhoids, external without complications 97/35/3299  . Nausea in adult patient 02/24/2016  . Endometriosis determined by laparoscopy 01/25/2016  . Chronic constipation 08/14/2015  . Monilial vaginitis 08/14/2015  . Bilateral ganglion cysts of wrists 08/14/2015  . Mild mitral regurgitation 05/19/2015  . B12 deficiency 05/18/2015  . Asthma, moderate 05/18/2015  . Anxiety 05/18/2015  . Insomnia, persistent 05/18/2015  . Eczema of hand 05/18/2015  . Viral keratitis 05/18/2015  . Migraine without aura and without status migrainosus, not intractable 05/18/2015  . Numerous moles 05/18/2015  . Allergic rhinitis 05/18/2015  . History of corneal transplant 05/18/2015  . Gastroesophageal reflux disease without esophagitis 12/16/2009    Past Surgical History:  Procedure Laterality Date  . ABDOMINAL HYSTERECTOMY    . COLONOSCOPY WITH PROPOFOL N/A 09/29/2017   Procedure: COLONOSCOPY WITH PROPOFOL;  Surgeon: Jessica Bellows, MD;  Location: [facility] ENDOSCOPY;  Service: Gastroenterology;  Laterality: N/A;  . CORNEAL TRANSPLANT Right 01/07/2013  . ESOPHAGOGASTRODUODENOSCOPY (EGD) WITH PROPOFOL N/A 09/29/2017   Procedure: ESOPHAGOGASTRODUODENOSCOPY (EGD) WITH PROPOFOL;  Surgeon: Jessica Bellows, MD;  Location: [facility] ENDOSCOPY;  Service: Gastroenterology;  Laterality: N/A;  . EYE SURGERY    . eyelid surgery Right June 19, 2015   [facility] by Dr. Norlene Carroll  . LAPAROSCOPIC BILATERAL SALPINGECTOMY N/A 01/25/2016   Procedure: LAPAROSCOPIC BILATERAL SALPINGECTOMY, ADHESIOLYSIS,  LAPAROSCOPIC EXCISION/FULGERATION ENDOMETRIOSIS ;  Surgeon: Jessica Mars, MD;  Location: [facility] ORS;  Service: Gynecology;  Laterality: N/A;  . Laparoscopic excision and fulguration of endometriosis N/A    bladder flap and uterosacral ligament endometriosis, excised and cauterized  . UPPER GI ENDOSCOPY  07/19/2012  . VAGINAL HYSTERECTOMY N/A 07/31/2017   Procedure: HYSTERECTOMY VAGINAL;  Surgeon: Jessica Mars, MD;  Location: [facility] ORS;  Service: Gynecology;  Laterality: N/A;    Family History  Adopted: Yes  Problem Relation Age of Onset  . Hyperlipidemia Mother   . Hypertension Mother   . Diabetes Father   . Heart failure Father 5       hear attack  . Cancer Neg Hx   . Breast cancer Neg Hx     Social History   Socioeconomic History  . Marital status: Single    Spouse name: Not on file  . Number of children: 0  . Years of education: 60  . Highest education level: High school graduate  Occupational History  . Occupation: Education officer, community  . Financial resource strain: Patient refused  . Food insecurity:    Worry: Never true    Inability: Never true  . Transportation needs:    Medical: No    Non-medical: No  Tobacco Use  . Smoking status: Never Smoker  . Smokeless tobacco: Never Used  Substance and Sexual Activity  . Alcohol use: Not Currently    Alcohol/week: 0.0 standard drinks    Comment: occ. wine  . Drug use: No  . Sexual activity: Yes    Partners: Male    Birth control/protection: None, Surgical  Lifestyle  . Physical activity:    Days per week: 5 days    Minutes per session: 150+ min  . Stress: To some extent  Relationships  . Social connections:    Talks on phone: More than three times a week    Gets together: Once a week    Attends religious service: More than 4 times per year    Active member of club or organization: No    Attends meetings of clubs or organizations: Never    Relationship status: Never married  . Intimate partner  violence:    Fear of current or ex partner: No    Emotionally abused: No    Physically abused: No    Forced sexual activity: No  Other Topics Concern  . Not on file  Social History Narrative   Engaged to get married to her boyfriend of 14 years.     Current Outpatient Medications:  .  albuterol (PROVENTIL HFA) 108 (90 Base) MCG/ACT inhaler, Inhale 2 puffs into the lungs every 6 (six) hours as needed for wheezing or shortness of breath., Disp: 1 Inhaler, Rfl: 1 .  ALPRAZolam (XANAX) 0.5 MG tablet, Take 1 tablet (0.5 mg total) by mouth 2 (two) times daily as needed for anxiety., Disp: 20 tablet, Rfl: 0 .  BREO ELLIPTA 100-25 MCG/INH AEPB, INHALE 1 PUFF INTO THE LUNGS EVERY DAY, Disp: 60 each, Rfl: 5 .  cyclobenzaprine (FLEXERIL) 10 MG tablet, Take 0.5-1 tablets (5-10 mg total) by mouth at bedtime., Disp: 30 tablet, Rfl: 0 .  DEXILANT 60 MG capsule, Take 1 capsule (  60 mg total) by mouth daily., Disp: 30 capsule, Rfl: 5 .  estradiol (ESTRACE) 1 MG tablet, Take 1.5 mg by mouth daily., Disp: 45 tablet, Rfl: 2 .  ibuprofen (ADVIL,MOTRIN) 800 MG tablet, Take 1 tablet (800 mg total) every 8 (eight) hours as needed by mouth., Disp: 30 tablet, Rfl: 1 .  ketotifen (ZADITOR) 0.025 % ophthalmic solution, Place 1 drop into the right eye daily., Disp: , Rfl:  .  L-Theanine 100 MG CAPS, Take 2 capsules by mouth daily. With GABA and lemom balm, Disp: 60 capsule, Rfl: 0 .  Menthol, Topical Analgesic, (BIOFREEZE) 4 % GEL, Apply 2 g topically 4 (four) times daily., Disp: 1 Tube, Rfl: 0 .  MULTIPLE VITAMIN PO, Take 1 tablet by mouth daily., Disp: , Rfl:  .  Plecanatide (TRULANCE) 3 MG TABS, Take 1 tablet by mouth daily. In place of Lin[employer]ess, insurance request, Disp: 30 tablet, Rfl: 2 .  prednisoLONE acetate (PRED FORTE) 1 % ophthalmic suspension, Place 1 drop into the right eye daily. , Disp: , Rfl:  .  promethazine (PHENERGAN) 12.5 MG tablet, Take 1 tablet (12.5 mg total) by mouth every 8 (eight) hours as needed  for nausea or vomiting., Disp: 20 tablet, Rfl: 0 .  rizatriptan (MAXALT) 10 MG tablet, Take 1 tablet (10 mg total) by mouth 2 (two) times daily as needed., Disp: 10 tablet, Rfl: 2 .  Travoprost, BAK Free, (TRAVATAN) 0.004 % SOLN ophthalmic solution, Place 1 drop into the right eye at bedtime., Disp: , Rfl:  .  triamcinolone cream (KENALOG) 0.1 %, Apply 1 application topically 2 (two) times daily as needed., Disp: 30 g, Rfl: 0 .  valACYclovir (VALTREX) 500 MG tablet, Take 1 tablet by mouth 2 (two) times daily., Disp: , Rfl: 0  Allergies  Allergen Reactions  . Bactrim [Sulfamethoxazole-Trimethoprim] Nausea Only  . Penicillins Nausea Only    I personally reviewed active problem list, medication list, allergies, family history, social history with the patient/caregiver today.   ROS  Constitutional: Negative for fever or weight change.  Respiratory: Positive  for cough but no  shortness of breath.   Cardiovascular: Negative for chest pain or palpitations.  Gastrointestinal: Negative for abdominal pain, no bowel changes.  Musculoskeletal: Negative for gait problem or joint swelling.  Skin: Negative for rash.  Neurological: Negative for dizziness , positive for intermittent  headache.  No other specific complaints in a complete review of systems (except as listed in HPI above).  Objective  Vitals:   01/11/19 1430  BP: 128/72  Pulse: 73  Resp: 16  Temp: 97.6 F (36.4 C)  TempSrc: Oral  SpO2: 99%  Weight: 122 lb 12.8 oz (55.7 kg)  Height: '5\' 6"'  (1.676 m)    Body mass index is 19.82 kg/m.  Physical Exam  Constitutional: Patient appears well-developed and well-nourished.  No distress.  HEENT: head atraumatic, normocephalic, pupils equal and reactive to light, ears normal TM bilaterally,  neck supple, throat within normal limits Cardiovascular: Normal rate, regular rhythm and normal heart sounds.  No murmur heard. No BLE edema. Pulmonary/Chest: Effort normal and breath sounds  normal. No respiratory distress. Abdominal: Soft.  There is no tenderness. Psychiatric: Patient has a normal mood and affect. behavior is normal. Judgment and thought content normal.  Recent Results (from the past 2160 hour(s))  Hemoglobin A1c     Status: None   Collection Time: 10/17/18 10:58 AM  Result Value Ref Range   Hgb A1c MFr Bld 5.5 <5.7 % of total  Hgb    Comment: For the purpose of screening for the presence of diabetes: . <5.7%       Consistent with the absence of diabetes 5.7-6.4%    Consistent with increased risk for diabetes             (prediabetes) > or =6.5%  Consistent with diabetes . This assay result is consistent with a decreased risk of diabetes. . Currently, no consensus exists regarding use of hemoglobin A1c for diagnosis of diabetes in children. . According to American Diabetes Association (ADA) guidelines, hemoglobin A1c <7.0% represents optimal control in non-pregnant diabetic patients. Different metrics may apply to specific patient populations.  Standards of Medical Care in Diabetes(ADA). .    Mean Plasma Glucose 111 (calc)   eAG (mmol/L) 6.2 (calc)  Lipid panel     Status: Abnormal   Collection Time: 10/17/18 10:58 AM  Result Value Ref Range   Cholesterol 183 <200 mg/dL   HDL 62 >50 mg/dL   Triglycerides 51 <150 mg/dL   LDL Cholesterol (Calc) 108 (H) mg/dL (calc)    Comment: Reference range: <100 . Desirable range <100 mg/dL for primary prevention;   <70 mg/dL for patients with CHD or diabetic patients  with > or = 2 CHD risk factors. Marland Kitchen LDL-C is now calculated using the Martin-Hopkins  calculation, which is a validated novel method providing  better accuracy than the Friedewald equation in the  estimation of LDL-C.  Cresenciano Genre et al. Annamaria Helling. 6761;950(93): 2061-2068  (http://education.[facility].com/faq/FAQ164)    Total CHOL/HDL Ratio 3.0 <5.0 (calc)   Non-HDL Cholesterol (Calc) 121 <130 mg/dL (calc)    Comment: For patients with  diabetes plus 1 major ASCVD risk  factor, treating to a non-HDL-C goal of <100 mg/dL  (LDL-C of <70 mg/dL) is considered a therapeutic  option.   Vitamin B12     Status: None   Collection Time: 10/17/18 10:58 AM  Result Value Ref Range   Vitamin B-12 312 200 - 1,100 pg/mL    Comment: . Please Note: Although the reference range for vitamin B12 is [id number] pg/mL, it has been reported that between 5 and 10% of patients with values between 200 and 400 pg/mL may experience neuropsychiatric and hematologic abnormalities due to occult B12 deficiency; less than 1% of patients with values above 400 pg/mL will have symptoms. .   CBC with Differential/Platelet     Status: Abnormal   Collection Time: 10/17/18 10:58 AM  Result Value Ref Range   WBC 5.0 3.8 - 10.8 Thousand/uL   RBC 3.63 (L) 3.80 - 5.10 Million/uL   Hemoglobin 10.8 (L) 11.7 - 15.5 g/dL   HCT 32.5 (L) 35.0 - 45.0 %   MCV 89.5 80.0 - 100.0 fL   MCH 29.8 27.0 - 33.0 pg   MCHC 33.2 32.0 - 36.0 g/dL   RDW 11.9 11.0 - 15.0 %   Platelets 187 140 - 400 Thousand/uL   MPV 11.9 7.5 - 12.5 fL   Neutro Abs 2,760 1,500 - 7,800 cells/uL   Lymphs Abs 1,820 850 - 3,900 cells/uL   WBC mixed population 340 200 - 950 cells/uL   Eosinophils Absolute 40 15 - 500 cells/uL   Basophils Absolute 40 0 - 200 cells/uL   Neutrophils Relative % 55.2 %   Total Lymphocyte 36.4 %   Monocytes Relative 6.8 %   Eosinophils Relative 0.8 %   Basophils Relative 0.8 %  COMPLETE METABOLIC PANEL WITH GFR     Status: Abnormal   Collection  Time: 10/17/18 10:58 AM  Result Value Ref Range   Glucose, Bld 82 65 - 99 mg/dL    Comment: .            Fasting reference interval .    BUN 11 7 - 25 mg/dL   Creat 0.64 0.50 - 1.10 mg/dL   GFR, Est Non African American 106 > OR = 60 mL/min/1.45m   GFR, Est African American 122 > OR = 60 mL/min/1.750m  BUN/Creatinine Ratio NOT APPLICABLE 6 - 22 (calc)   Sodium 138 135 - 146 mmol/L   Potassium 3.8 3.5 - 5.3 mmol/L    Chloride 105 98 - 110 mmol/L   CO2 26 20 - 32 mmol/L   Calcium 9.0 8.6 - 10.2 mg/dL   Total Protein 6.5 6.1 - 8.1 g/dL   Albumin 4.3 3.6 - 5.1 g/dL   Globulin 2.2 1.9 - 3.7 g/dL (calc)   AG Ratio 2.0 1.0 - 2.5 (calc)   Total Bilirubin 1.4 (H) 0.2 - 1.2 mg/dL   Alkaline phosphatase (APISO) 45 33 - 115 U/L   AST 14 10 - 35 U/L   ALT 11 6 - 29 U/L  Iron, TIBC and Ferritin Panel     Status: None   Collection Time: 10/17/18 10:58 AM  Result Value Ref Range   Iron 107 40 - 190 mcg/dL   TIBC 331 250 - 450 mcg/dL (calc)   %SAT 32 16 - 45 % (calc)   Ferritin 37 16 - 232 ng/mL  Hepatitis C antibody     Status: None   Collection Time: 10/17/18 10:58 AM  Result Value Ref Range   Hepatitis C Ab NON-REACTIVE NON-REACTI   SIGNAL TO CUT-OFF 0.01 <1.00    Comment: . HCV antibody was non-reactive. There is no laboratory  evidence of HCV infection. . In most cases, no further action is required. However, if recent HCV exposure is suspected, a test for HCV RNA (test code [id number] suggested. . For additional information please refer to http://education.[facility].com/faq/FAQ22v1 (This link is being provided for informational/ educational purposes only.) .   Sedimentation rate     Status: None   Collection Time: 12/06/18 10:38 AM  Result Value Ref Range   Sed Rate 9 0 - 20 mm/hr    Comment: Performed at [facility]Iron and TIBC     Status: None   Collection Time: 12/06/18 10:38 AM  Result Value Ref Range   Iron 69 28 - 170 ug/dL   TIBC 384 250 - 450 ug/dL   Saturation Ratios 18 10.4 - 31.8 %   UIBC 315 ug/dL    Comment: Performed at [facility] [id number]     Status: None   Collection Time: 12/06/18 10:38 AM  Result Value Ref Range   Ferritin 24 11 - 307 ng/mL    Comment: Performed at [facility] , [place] [id number]  dehydrogenase     Status: None   Collection Time: 12/06/18 10:38 AM  Result Value Ref Range   LDH 140 98 - 192 U/L    Comment: Performed at [facility] [id number]     Status: None   Collection Time: 12/06/18 10:38 AM  Result Value Ref Range   TSH 0.810 0.350 - 4.500 uIU/mL    Comment: Performed by a 3rd  Generation assay with a functional sensitivity of <=0.01 uIU/mL. Performed at [facility], [address] [place], [place] [address]   Haptoglobin     Status: None   Collection Time: 12/06/18 10:38 AM  Result Value Ref Range   Haptoglobin 116 42 - 296 mg/dL    Comment: (NOTE)              **Please note reference interval change** Performed At: [facility] [place], [place] [id number] Rush Farmer MD OF:[id number]   Multiple Myeloma Panel (SPEP&IFE w/QIG)     Status: None   Collection Time: 12/06/18 10:38 AM  Result Value Ref Range   IgG (Immunoglobin G), Serum 1,045 700 - 1,600 mg/dL   IgA 148 87 - 352 mg/dL   IgM (Immunoglobulin M), Srm 79 26 - 217 mg/dL   Total Protein ELP 7.0 6.0 - 8.5 g/dL   Albumin SerPl Elph-Mcnc 4.2 2.9 - 4.4 g/dL   Alpha 1 0.1 0.0 - 0.4 g/dL   Alpha2 Glob SerPl Elph-Mcnc 0.7 0.4 - 1.0 g/dL   B-Globulin SerPl Elph-Mcnc 1.0 0.7 - 1.3 g/dL   Gamma Glob SerPl Elph-Mcnc 0.9 0.4 - 1.8 g/dL   M Protein SerPl Elph-Mcnc Not Observed Not Observed g/dL   Globulin, Total 2.8 2.2 - 3.9 g/dL   Albumin/Glob SerPl 1.6 0.7 - 1.7   IFE 1 Comment     Comment: An apparent normal immunofixation pattern.   Please Note Comment     Comment: (NOTE) Protein electrophoresis scan will follow via computer, mail, or courier delivery. Performed At: [facility] [place], [place] [id number] Rush Farmer MD RW:[id number]   Reticulocytes     Status: None   Collection Time: 12/06/18 10:38 AM  Result Value Ref Range   Retic Ct Pct 1.2 0.4 - 3.1 %   RBC. 3.93 3.87 - 5.11  MIL/uL   Retic Count, Absolute 47.2 19.0 - 186.0 K/uL   Immature Retic Fract 4.1 2.3 - 15.9 %    Comment: Performed at [facility], [address] [place], [place] [address]  Folate     Status: None   Collection Time: 12/06/18 10:38 AM  Result Value Ref Range   Folate 10.5 >5.9 ng/mL    Comment: Performed at [facility], [address] [place], [place] [address]  Comprehensive metabolic panel     Status: None   Collection Time: 12/06/18 10:38 AM  Result Value Ref Range   Sodium 138 135 - 145 mmol/L   Potassium 4.3 3.5 - 5.1 mmol/L   Chloride 104 98 - 111 mmol/L   CO2 27 22 - 32 mmol/L   Glucose, Bld 87 70 - 99 mg/dL   BUN 13 6 - 20 mg/dL   Creatinine, Ser 0.65 0.44 - 1.00 mg/dL   Calcium 9.6 8.9 - 10.3 mg/dL   Total Protein 7.8 6.5 - 8.1 g/dL   Albumin 4.6 3.5 - 5.0 g/dL   AST 18 15 - 41 U/L   ALT 15 0 - 44 U/L   Alkaline Phosphatase 44 38 - 126 U/L   Total Bilirubin 1.2 0.3 - 1.2 mg/dL   GFR calc non Af Amer >60 >60 mL/min   GFR calc Af Amer >60 >60 mL/min   Anion gap 7 5 - 15    Comment: Performed at [facility], [address] [place], [place] [address]  CBC with Differential/Platelet     Status: Abnormal   Collection Time: 12/06/18 10:38  AM  Result Value Ref Range   WBC 5.3 4.0 - 10.5 K/uL   RBC 3.93 3.87 - 5.11 MIL/uL   Hemoglobin 11.8 (L) 12.0 - 15.0 g/dL   HCT 36.6 36.0 - 46.0 %   MCV 93.1 80.0 - 100.0 fL   MCH 30.0 26.0 - 34.0 pg   MCHC 32.2 30.0 - 36.0 g/dL   RDW 12.0 11.5 - 15.5 %   Platelets 210 150 - 400 K/uL   nRBC 0.0 0.0 - 0.2 %   Neutrophils Relative % 56 %   Neutro Abs 3.0 1.7 - 7.7 K/uL   Lymphocytes Relative 35 %   Lymphs Abs 1.8 0.7 - 4.0 K/uL   Monocytes Relative 6 %   Monocytes Absolute 0.3 0.1 - 1.0 K/uL   Eosinophils Relative 2 %   Eosinophils Absolute 0.1 0.0 - 0.5 K/uL   Basophils Relative 1 %   Basophils Absolute 0.1 0.0 - 0.1 K/uL   Immature Granulocytes 0 %   Abs Immature Granulocytes 0.01 0.00 - 0.07  K/uL    Comment: Performed at [facility], [address] [place], [place] [address]     PHQ2/9: Depression screen [facility] 2/9 01/11/2019 12/20/2018 10/17/2018 09/25/2018 08/02/2018  Decreased Interest '1 2 2 ' 0 0  Down, Depressed, Hopeless '1 1 2 ' 0 0  PHQ - 2 Score '2 3 4 ' 0 0  Altered sleeping '3 2 3 2 1  ' Tired, decreased energy 0 '2 2 3 1  ' Change in appetite '1 1 2 1 ' 0  Feeling bad or failure about yourself  0 0 0 0 0  Trouble concentrating 0 0 0 0 0  Moving slowly or fidgety/restless 0 0 0 0 0  Suicidal thoughts 0 0 0 0 0  PHQ-9 Score '6 8 11 6 2  ' Difficult doing work/chores Somewhat difficult - - - Not difficult at all     Fall Risk: Fall Risk  12/20/2018 12/20/2018 10/17/2018 09/25/2018 08/02/2018  Falls in the past year? 0 0 0 No No  Number falls in past yr: 0 0 - - -  Injury with Fall? 0 0 - - -  Risk for fall due to : - - - - -     Functional Status Survey: Is the patient deaf or have difficulty hearing?: No Does the patient have difficulty seeing, even when wearing glasses/contacts?: No Does the patient have difficulty concentrating, remembering, or making decisions?: No Does the patient have difficulty walking or climbing stairs?: No Does the patient have difficulty dressing or bathing?: No Does the patient have difficulty doing errands alone such as visiting a doctor's office or shopping?: No    Assessment & Plan  1. Moderate persistent asthma with exacerbation  - predniSONE (DELTASONE) 10 MG tablet; Take 1 tablet (10 mg total) by mouth 2 (two) times daily with a meal.  Dispense: 10 tablet; Refill: 0  2. OM (otitis media), recurrent, left  - doxycycline (VIBRA-TABS) 100 MG tablet; Take 1 tablet (100 mg total) by mouth 2 (two) times daily.  Dispense: 20 tablet; Refill: 0 - fluconazole (DIFLUCAN) 150 MG tablet; Take 1 tablet (150 mg total) by mouth every other day.  Dispense: 3 tablet; Refill: 0  3. Gastroesophageal reflux disease without esophagitis  - DEXILANT 60 MG  capsule; Take 1 capsule (60 mg total) by mouth daily.  Dispense: 30 capsule; Refill: 5  4. Moderate episode of recurrent major depressive disorder ([place])   5. Migraine without aura and without status migrainosus, not  intractable   6. Chronic constipation

## 2019-01-14 IMAGING — US US ABDOMEN LIMITED
1 series · 14 of 25 positions shown · non-contrast
Comparison: CT 08/23/2017

CLINICAL DATA: Right upper quadrant abdominal pain, nausea

EXAM:
ULTRASOUND ABDOMEN LIMITED RIGHT UPPER QUADRANT

[Series 1: us abdomen limited · 0.22mm/px · 14 of 76 slices shown]
[im 1/76]
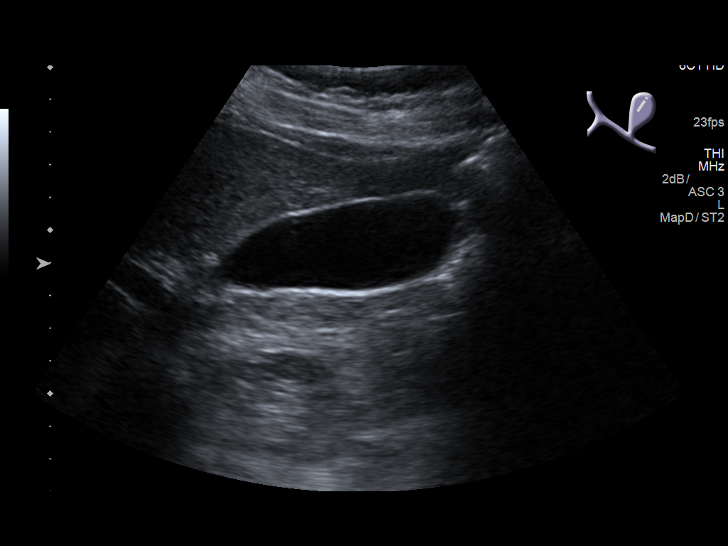
[im 7/76]
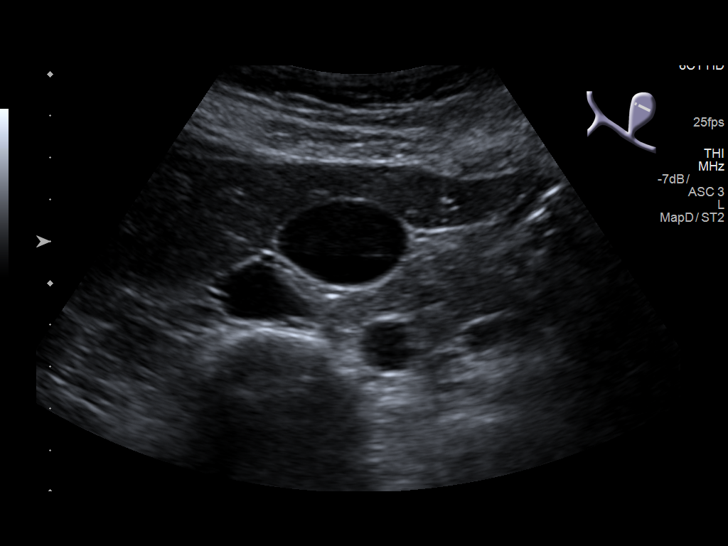
[im 13/76]
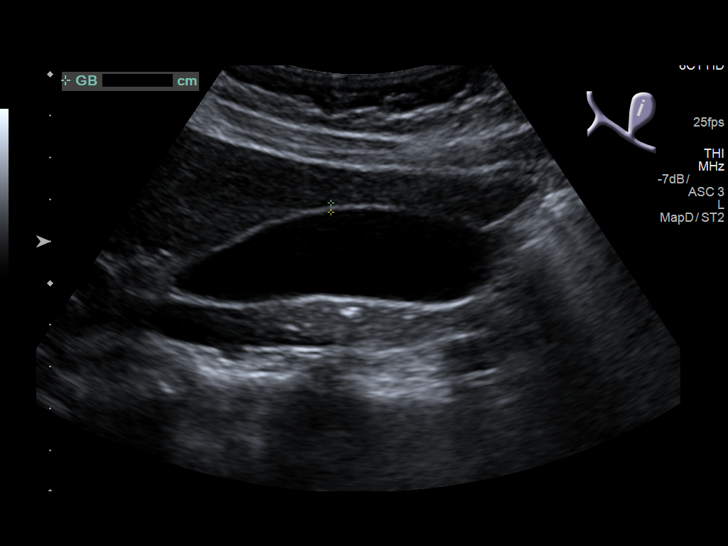
[im 19/76]
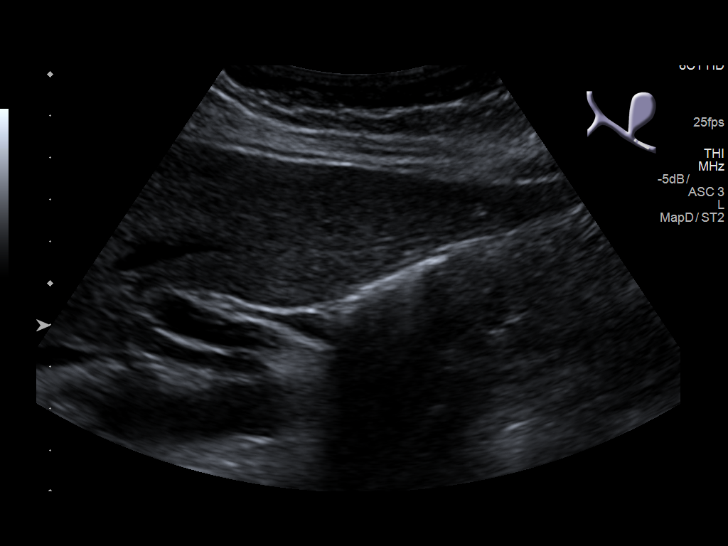
[im 26/76]
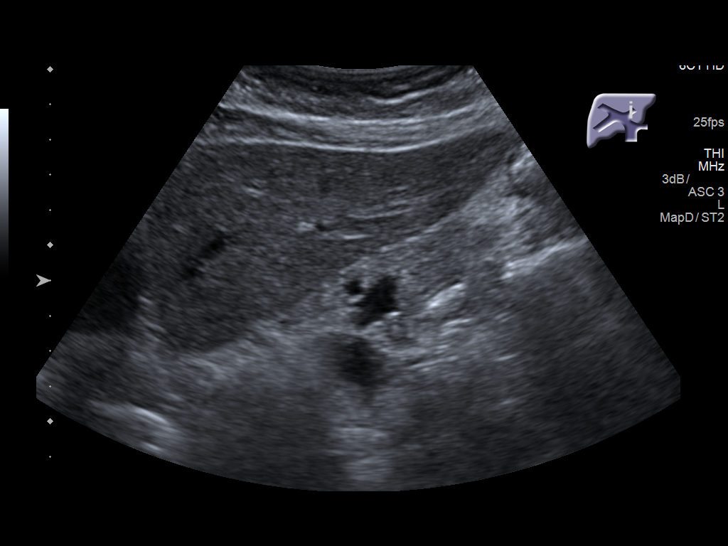
[im 29/76]
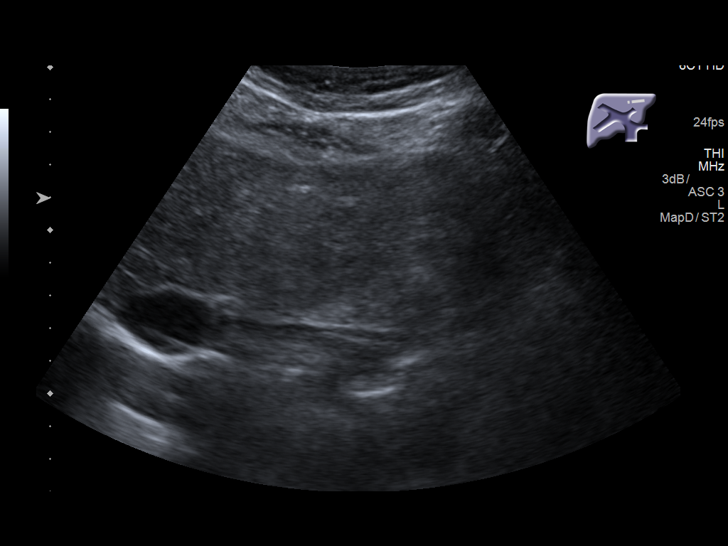
[im 35/76]
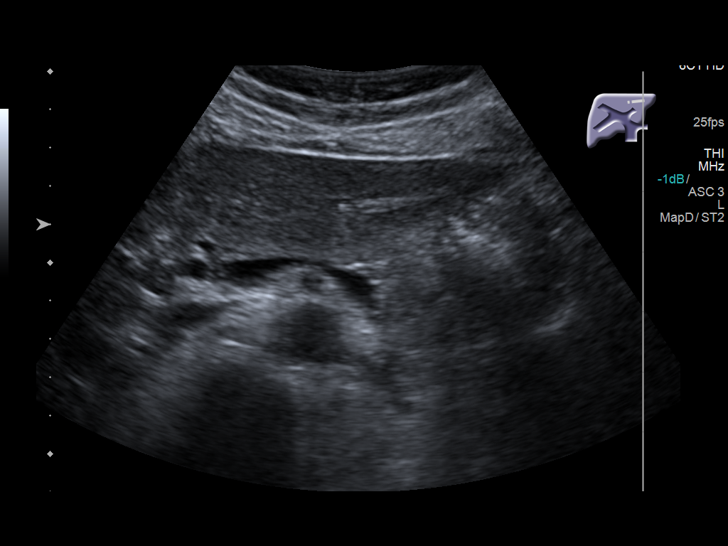
[im 41/76]
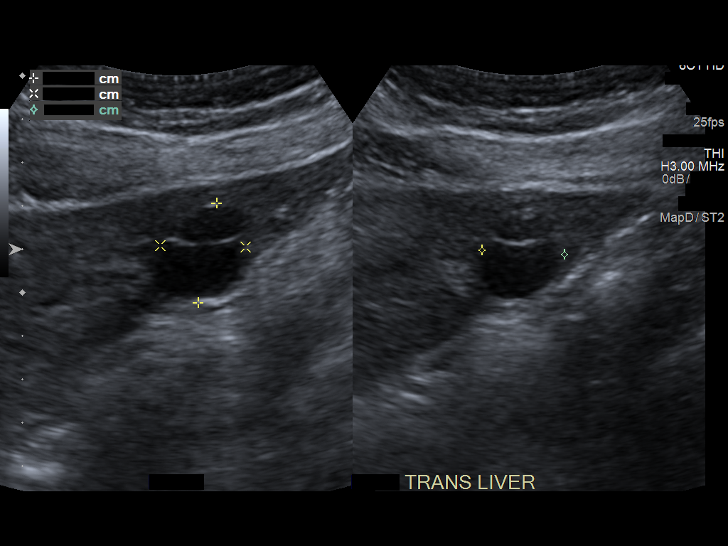
[im 47/76]
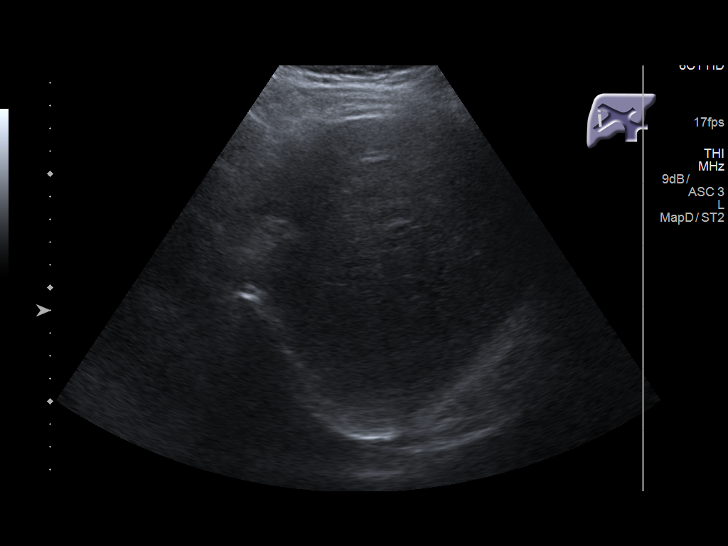
[im 51/76]
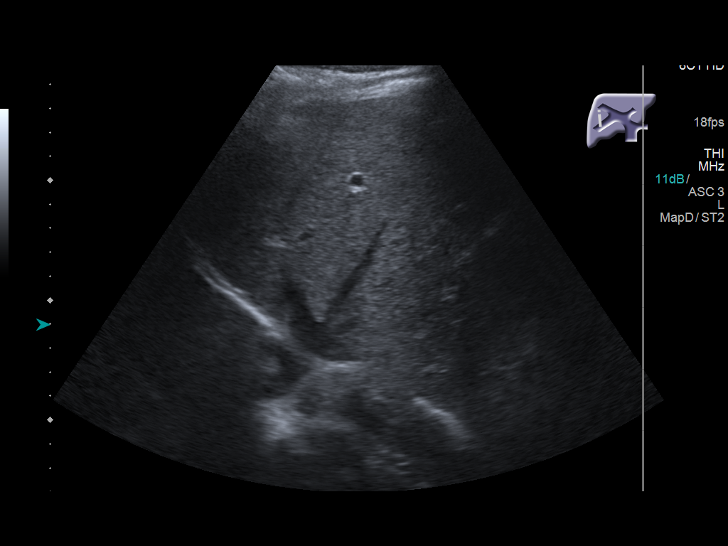
[im 57/76]
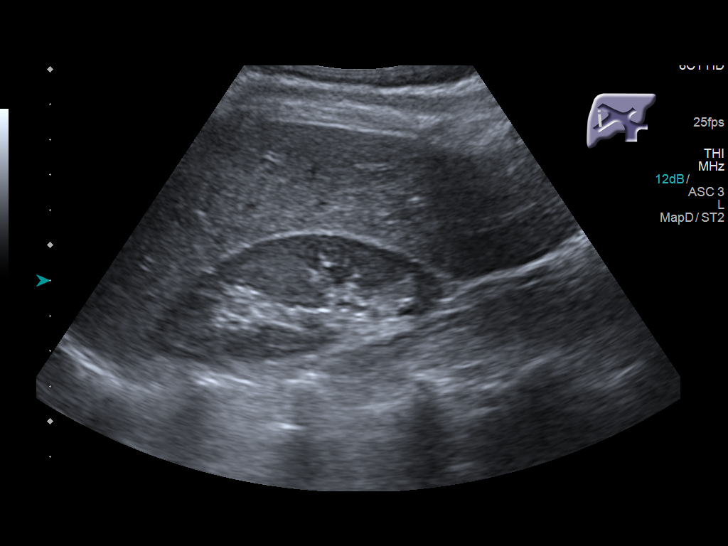
[im 63/76]
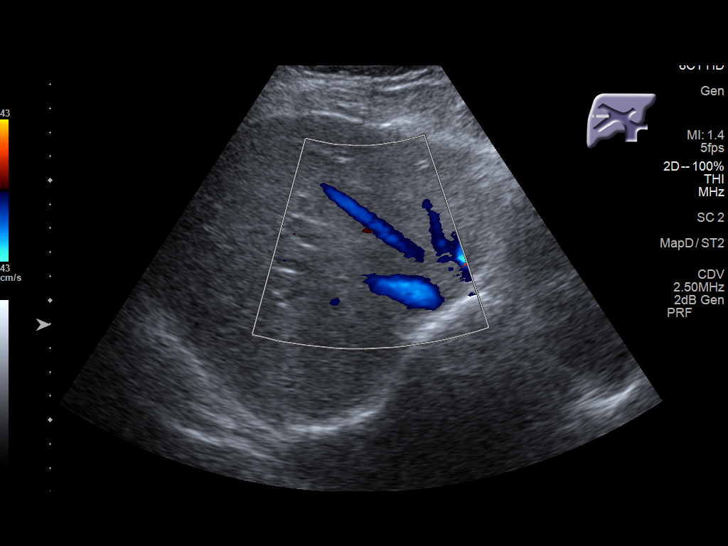
[im 69/76]
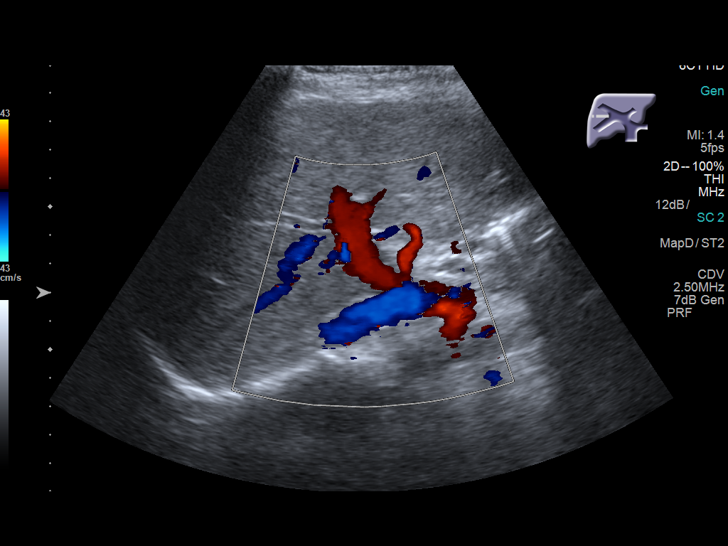
[im 76/76]
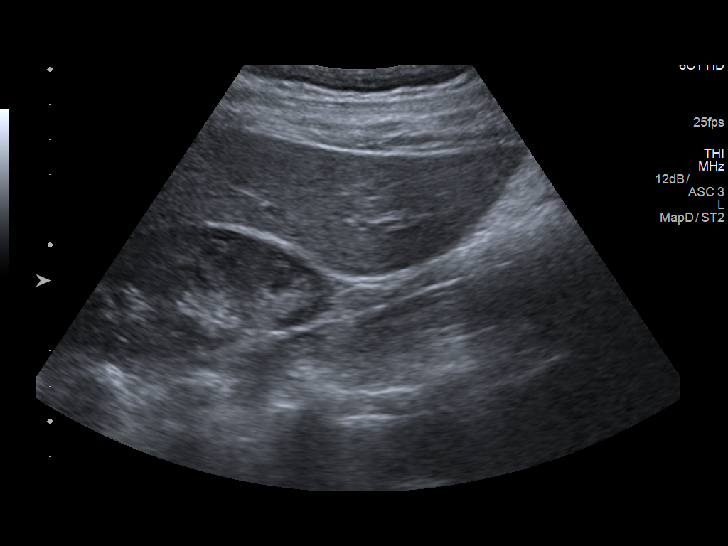

[14 of 25 positions shown; findings below may reference images not displayed]

FINDINGS: Gallbladder:

No gallstones or wall thickening visualized. No sonographic Murphy
sign noted by sonographer.

Common bile duct:

Diameter: Normal caliber, 3 mm

Liver:

Complex cyst in the left hepatic lobe measures 2.3 x 2.0 x 1.9 cm.
Internal septations noted which are thin. No internal blood flow or
nodularity. Findings most compatible with mildly complex septated
cysts, benign. 12 mm simple appearing cyst in the left lobe. Normal
echotexture. No biliary ductal dilatation. Portal vein is patent on
color Doppler imaging with normal direction of blood flow towards
the liver.
IMPRESSION: No acute findings.  Benign-appearing cysts in the left hepatic lobe.

## 2019-01-17 ENCOUNTER — Ambulatory Visit: Payer: BLUE CROSS/BLUE SHIELD | Admitting: Gastroenterology

## 2019-01-17 ENCOUNTER — Other Ambulatory Visit: Payer: Self-pay | Admitting: Family Medicine

## 2019-01-17 DIAGNOSIS — K219 Gastro-esophageal reflux disease without esophagitis: Secondary | ICD-10-CM

## 2019-02-27 ENCOUNTER — Telehealth: Payer: Self-pay | Admitting: Family Medicine

## 2019-02-27 DIAGNOSIS — H6692 Otitis media, unspecified, left ear: Secondary | ICD-10-CM

## 2019-02-27 MED ORDER — FLUCONAZOLE 150 MG PO TABS: 150.0000 mg | ORAL_TABLET | ORAL | 0 refills | Status: DC

## 2019-02-27 NOTE — Telephone Encounter (Signed)
The symptoms she described are not typical of yeast infection, I will send one diflucan but she will likely need to return for evaluation, she has also seen gyn in the past and can follow up with them

## 2019-02-27 NOTE — Telephone Encounter (Signed)
Copied from [place] [id number]. Topic: General - Inquiry >> Feb 27, 2019 11:56 AM Virl Axe D wrote: Reason for CRM: Pt stated she thinks she has a yeast infection. She is having discomfort, discharge and an odor in the vaginal area. She does not feel comfortable coming into the office due to Covid 19 and would like to know if Dr. Ancil Boozer can send in the same rx she sent last time she had one. Pt thinks rx was for Diflucan? Please advise. CB#[id number]

## 2019-02-28 NOTE — Telephone Encounter (Signed)
Patient notified and states she will take the Diflucan and if it doesn't resolve she will call and schedule an appointment.

## 2019-03-18 ENCOUNTER — Other Ambulatory Visit: Payer: Self-pay | Admitting: Family Medicine

## 2019-03-18 ENCOUNTER — Telehealth: Payer: Self-pay | Admitting: Family Medicine

## 2019-03-18 DIAGNOSIS — K219 Gastro-esophageal reflux disease without esophagitis: Secondary | ICD-10-CM

## 2019-03-18 MED ORDER — PLECANATIDE 3 MG PO TABS: 1.0000 | ORAL_TABLET | Freq: Every day | ORAL | 2 refills | Status: DC

## 2019-03-18 MED ORDER — ESOMEPRAZOLE MAGNESIUM 40 MG PO CPDR: 40.0000 mg | DELAYED_RELEASE_CAPSULE | Freq: Every day | ORAL | 0 refills | Status: DC

## 2019-03-18 NOTE — Telephone Encounter (Signed)
Copied from [place] [phone]. Topic: Quick Communication - See Telephone Encounter >> Mar 18, 2019 11:52 AM Antonieta Iba C wrote: CRM for notification. See Telephone encounter for: 03/18/19.  Pt says that she was told by her pharmacy that her medication DEXILANT 60 MG capsule need a PA completed. Pt would like to make sure that the pharmacy made PCP aware .  Also, pt says that she need a refill on Plecanatide (TRULANCE) 3 MG TABS   Pharmacy: [facility] [address] [facility] [address] [phone] (Phone) [phone] (Fax)

## 2019-03-19 ENCOUNTER — Other Ambulatory Visit: Payer: Self-pay

## 2019-03-19 DIAGNOSIS — K219 Gastro-esophageal reflux disease without esophagitis: Secondary | ICD-10-CM

## 2019-03-19 NOTE — Telephone Encounter (Signed)
Insurance does not want to cover Dexilant 60 mg but prefers Nexium. Left patient a voice message with this information.

## 2019-03-20 ENCOUNTER — Other Ambulatory Visit: Payer: Self-pay | Admitting: Family Medicine

## 2019-03-20 ENCOUNTER — Other Ambulatory Visit: Payer: Self-pay | Admitting: Surgical

## 2019-03-20 DIAGNOSIS — J4540 Moderate persistent asthma, uncomplicated: Secondary | ICD-10-CM

## 2019-03-20 MED ORDER — FLUCONAZOLE 150 MG PO TABS: 150.0000 mg | ORAL_TABLET | Freq: Every day | ORAL | 0 refills | Status: DC

## 2019-03-20 MED ORDER — ALBUTEROL SULFATE HFA 108 (90 BASE) MCG/ACT IN AERS: 2.0000 | INHALATION_SPRAY | Freq: Four times a day (QID) | RESPIRATORY_TRACT | 2 refills | Status: DC | PRN

## 2019-03-20 NOTE — Telephone Encounter (Signed)
Copied from [place] [id number]. Topic: Quick Communication - Rx Refill/Question >> Mar 20, 2019 10:12 AM Margot Ables wrote: Medication: albuterol (PROVENTIL HFA) 108 (90 Base) MCG/ACT inhaler  - pt inhaler is almost out and she does not have refills.  Has the patient contacted their pharmacy? Yes - no refills Preferred Pharmacy (with phone number or street name): [id number] - [place], [place] [phone] (Phone) [phone] (Fax)

## 2019-03-20 NOTE — Telephone Encounter (Signed)
Refill request for general medication: Albuterol 108 inhaler  Last office visit: 01/10/2019  Follow-ups on file. 07/12/2019

## 2019-03-20 NOTE — Progress Notes (Signed)
Patient called in with vaginal itching and burning. I sent in diflucan to the pharmacy. If this does not help she will call back next week to be seen.

## 2019-03-21 ENCOUNTER — Telehealth: Payer: Self-pay

## 2019-03-21 NOTE — Telephone Encounter (Signed)
Left a message to notify patient of the change of medication from [place] to Nexium due to Insurance. Also we have to see what Dr. Ancil Boozer wants to change her Trulance to since it was denied.

## 2019-03-21 NOTE — Telephone Encounter (Signed)
Copied from [place] [id number]. Topic: General - Other >> Mar 19, 2019  2:41 PM Alanda Slim E wrote: Reason for CRM: Pt called and asked for Noemi Bellissimo/ Shailey Butterbaugh can you please call Pt back/ tried to reach office several times but the line was busy/

## 2019-03-21 NOTE — Telephone Encounter (Signed)
Patient is calling in regards to medication trulance 3mg  and for provider to call [phone] and answer questions concerning to prior authorization. Stated insurance informed patient that they would approve medication if provider provided this info.

## 2019-03-22 ENCOUNTER — Other Ambulatory Visit: Payer: BLUE CROSS/BLUE SHIELD

## 2019-03-22 ENCOUNTER — Ambulatory Visit: Payer: BLUE CROSS/BLUE SHIELD | Admitting: Oncology

## 2019-03-25 NOTE — Telephone Encounter (Signed)
We have looked through the chart and the only thing that we seen that she has tried that was on the the list was Miralax they need two.

## 2019-03-25 NOTE — Telephone Encounter (Signed)
Trulance was approved though [email or web address] and patient was notified.

## 2019-03-25 NOTE — Telephone Encounter (Signed)
Spoke with patient and she states she was on Linzess previously and it worked but Insurance underwriter did not pay for it. Also she has tried Miralax but it did not help at all.

## 2019-03-26 ENCOUNTER — Other Ambulatory Visit: Payer: Self-pay | Admitting: Family Medicine

## 2019-03-26 ENCOUNTER — Ambulatory Visit: Payer: BLUE CROSS/BLUE SHIELD | Admitting: Oncology

## 2019-03-26 MED ORDER — PLECANATIDE 3 MG PO TABS: 1.0000 | ORAL_TABLET | Freq: Every day | ORAL | 1 refills | Status: DC

## 2019-03-26 NOTE — Telephone Encounter (Signed)
Patient calling and would like to know if this prescription could be sent in as a 90 day supply? States that she can get a 90 day supply for the same price as a 30 day supply. Please advise.

## 2019-04-13 ENCOUNTER — Other Ambulatory Visit: Payer: Self-pay | Admitting: Obstetrics and Gynecology

## 2019-04-17 ENCOUNTER — Inpatient Hospital Stay: Payer: BLUE CROSS/BLUE SHIELD

## 2019-04-19 ENCOUNTER — Ambulatory Visit: Payer: BLUE CROSS/BLUE SHIELD | Admitting: Oncology

## 2019-04-22 ENCOUNTER — Telehealth: Payer: Self-pay

## 2019-04-22 MED ORDER — DEXLANSOPRAZOLE 60 MG PO CPDR: 60.0000 mg | DELAYED_RELEASE_CAPSULE | Freq: Every day | ORAL | 2 refills | Status: DC

## 2019-04-22 NOTE — Telephone Encounter (Signed)
Copied from [place] [phone]. Topic: General - Other >> Apr 22, 2019  9:43 AM Leward Quan A wrote: Reason for CRM: Patient called to say that the esomeprazole ([place]) 40 MG capsules not working for the acid reflux states that she stayed sick all weekend. Per patient said the insurance state that they will pay for a  Dexlansoprazole (Dexilant) 30 mg or 60 mg. Asking if it can be changed please but just have to let the insurance know that she was taking the Nexium.

## 2019-04-22 NOTE — Addendum Note (Signed)
Addended by: Steele Sizer F on: 04/22/2019 12:21 PM   Modules accepted: Orders

## 2019-05-03 DIAGNOSIS — M25562 Pain in left knee: Secondary | ICD-10-CM | POA: Diagnosis not present

## 2019-05-13 ENCOUNTER — Telehealth: Payer: Self-pay

## 2019-05-13 NOTE — Telephone Encounter (Signed)
Patient states the person in her company does not directly work with her-she works in another department. But Jessica Carroll states the person does sit in the same break room with her and is worried since she has asthma. However Jessica Carroll her HR is not concerned because they don't have direct exposure when working together. Patient wants Jessica Carroll advise on how to handle this. Should she tell her HR she has Asthma and wants to be tested? Please advise.

## 2019-05-13 NOTE — Telephone Encounter (Signed)
Patient notified

## 2019-05-13 NOTE — Telephone Encounter (Signed)
Copied from [place] [phone]. Topic: General - Other >> May 13, 2019  9:14 AM [place], Jessica Carroll wrote: Pt is concerned bc another employee where she works has tested positive for COVID and she would like some guidance on how she should proceed.  Please call pt to advise.  [phone]

## 2019-05-21 ENCOUNTER — Ambulatory Visit: Payer: BLUE CROSS/BLUE SHIELD | Admitting: Obstetrics and Gynecology

## 2019-05-21 ENCOUNTER — Other Ambulatory Visit: Payer: Self-pay

## 2019-05-21 ENCOUNTER — Encounter: Payer: Self-pay | Admitting: Obstetrics and Gynecology

## 2019-05-21 VITALS — BP 104/68 | HR 60 | Ht 65.0 in | Wt 125.8 lb

## 2019-05-21 DIAGNOSIS — B373 Candidiasis of vulva and vagina: Secondary | ICD-10-CM | POA: Diagnosis not present

## 2019-05-21 DIAGNOSIS — R350 Frequency of micturition: Secondary | ICD-10-CM

## 2019-05-21 DIAGNOSIS — B3731 Acute candidiasis of vulva and vagina: Secondary | ICD-10-CM

## 2019-05-21 LAB — POCT URINALYSIS DIPSTICK OB
Bilirubin, UA: NEGATIVE
Blood, UA: NEGATIVE
Glucose, UA: NEGATIVE
Ketones, UA: NEGATIVE
Leukocytes, UA: NEGATIVE
Nitrite, UA: NEGATIVE
POC,PROTEIN,UA: NEGATIVE
Spec Grav, UA: 1.01 (ref 1.010–1.025)
Urobilinogen, UA: 0.2 E.U./dL
pH, UA: 6.5 (ref 5.0–8.0)

## 2019-05-21 MED ORDER — FLUCONAZOLE 150 MG PO TABS: 150.0000 mg | ORAL_TABLET | Freq: Once | ORAL | 2 refills | Status: AC

## 2019-05-21 NOTE — Progress Notes (Signed)
HPI:      [name] is a 48 y.o. G0P0000 who LMP was Patient's last menstrual period was 07/11/2017 (exact date).  Subjective:   She presents today with complaint of 2-day history of vulvar/labial burning with urination.  She also states that she is uncomfortable sometimes even without urination.  She says that she gets 2-3 yeast infections per year and she believes this is a yeast infection.    Hx: The following portions of the patient's history were reviewed and updated as appropriate:             She  has a past medical history of Anxiety, Asthma (2011), Depression, Diffuse cystic mastopathy, Endometriosis, GERD (gastroesophageal reflux disease), and Headache. She does not have any pertinent problems on file. She  has a past surgical history that includes Upper gi endoscopy (07/19/2012); Corneal transplant (Right, 01/07/2013); eyelid surgery (Right, June 19, 2015); Laparoscopic excision and fulguration of endometriosis (N/A); Laparoscopic bilateral salpingectomy (N/A, 01/25/2016); Eye surgery; Vaginal hysterectomy (N/A, 07/31/2017); Colonoscopy with propofol (N/A, 09/29/2017); Esophagogastroduodenoscopy (egd) with propofol (N/A, 09/29/2017); and Abdominal hysterectomy. Her family history includes Diabetes in her father; Heart failure (age of onset: 70) in her father; Hyperlipidemia in her mother; Hypertension in her mother. She was adopted. She  reports that she has never smoked. She has never used smokeless tobacco. She reports previous alcohol use. She reports that she does not use drugs. She has a current medication list which includes the following prescription(s): albuterol, alprazolam, breo ellipta, cyclobenzaprine, dexlansoprazole, estradiol, fluconazole, fluconazole, ibuprofen, ketotifen, l-theanine, menthol (topical analgesic), multiple vitamin, plecanatide, prednisolone acetate, prednisone, promethazine, rizatriptan, travoprost (bak free), triamcinolone cream, and valacyclovir. She  is allergic to bactrim [sulfamethoxazole-trimethoprim] and penicillins.       Review of Systems:  Review of Systems  Constitutional: Denied constitutional symptoms, night sweats, recent illness, fatigue, fever, insomnia and weight loss.  Eyes: Denied eye symptoms, eye pain, photophobia, vision change and visual disturbance.  Ears/Nose/Throat/Neck: Denied ear, nose, throat or neck symptoms, hearing loss, nasal discharge, sinus congestion and sore throat.  Cardiovascular: Denied cardiovascular symptoms, arrhythmia, chest pain/pressure, edema, exercise intolerance, orthopnea and palpitations.  Respiratory: Denied pulmonary symptoms, asthma, pleuritic pain, productive sputum, cough, dyspnea and wheezing.  Gastrointestinal: Denied, gastro-esophageal reflux, melena, nausea and vomiting.  Genitourinary: See HPI for additional information.  Musculoskeletal: Denied musculoskeletal symptoms, stiffness, swelling, muscle weakness and myalgia.  Dermatologic: Denied dermatology symptoms, rash and scar.  Neurologic: Denied neurology symptoms, dizziness, headache, neck pain and syncope.  Psychiatric: Denied psychiatric symptoms, anxiety and depression.  Endocrine: Denied endocrine symptoms including hot flashes and night sweats.   Meds:   Current Outpatient Medications on File Prior to Visit  Medication Sig Dispense Refill  . albuterol (PROVENTIL HFA) 108 (90 Base) MCG/ACT inhaler Inhale 2 puffs into the lungs every 6 (six) hours as needed for wheezing or shortness of breath. 1 Inhaler 2  . ALPRAZolam (XANAX) 0.5 MG tablet Take 1 tablet (0.5 mg total) by mouth 2 (two) times daily as needed for anxiety. 20 tablet 0  . BREO ELLIPTA 100-25 MCG/INH AEPB INHALE 1 PUFF INTO THE LUNGS EVERY DAY 60 each 5  . cyclobenzaprine (FLEXERIL) 10 MG tablet Take 0.5-1 tablets (5-10 mg total) by mouth at bedtime. 30 tablet 0  . dexlansoprazole (DEXILANT) 60 MG capsule Take 1 capsule (60 mg total) by mouth daily. 30 capsule 2   . estradiol (ESTRACE) 1 MG tablet TAKE 1 AND 1/2 TABLETS BY MOUTH DAILY 45 tablet 2  . fluconazole (  DIFLUCAN) 150 MG tablet Take 1 tablet (150 mg total) by mouth daily. 1 tablet 0  . ibuprofen (ADVIL,MOTRIN) 800 MG tablet Take 1 tablet (800 mg total) every 8 (eight) hours as needed by mouth. 30 tablet 1  . ketotifen (ZADITOR) 0.025 % ophthalmic solution Place 1 drop into the right eye daily.    Marland Kitchen L-Theanine 100 MG CAPS Take 2 capsules by mouth daily. With GABA and lemom balm 60 capsule 0  . Menthol, Topical Analgesic, (BIOFREEZE) 4 % GEL Apply 2 g topically 4 (four) times daily. 1 Tube 0  . MULTIPLE VITAMIN PO Take 1 tablet by mouth daily.    Marland Kitchen Plecanatide (TRULANCE) 3 MG TABS Take 1 tablet by mouth daily. 90 tablet 1  . prednisoLONE acetate (PRED FORTE) 1 % ophthalmic suspension Place 1 drop into the right eye daily.     . predniSONE (DELTASONE) 10 MG tablet Take 1 tablet (10 mg total) by mouth 2 (two) times daily with a meal. 10 tablet 0  . promethazine (PHENERGAN) 12.5 MG tablet Take 1 tablet (12.5 mg total) by mouth every 8 (eight) hours as needed for nausea or vomiting. 20 tablet 0  . rizatriptan (MAXALT) 10 MG tablet Take 1 tablet (10 mg total) by mouth 2 (two) times daily as needed. 10 tablet 2  . Travoprost, BAK Free, (TRAVATAN) 0.004 % SOLN ophthalmic solution Place 1 drop into the right eye at bedtime.    . triamcinolone cream (KENALOG) 0.1 % Apply 1 application topically 2 (two) times daily as needed. 30 g 0  . valACYclovir (VALTREX) 500 MG tablet Take 1 tablet by mouth 2 (two) times daily.  0   No current facility-administered medications on file prior to visit.     Objective:     Vitals:   05/21/19 0932  BP: 104/68  Pulse: 60    Physical examination   Pelvic:   Vulva: Normal appearance.  No lesions.  Vagina: No lesions or abnormalities noted.  Support: Normal pelvic support.  Urethra No masses tenderness or scarring.  Meatus Normal size without lesions or prolapse.   Cervix:  Surgically absent  Anus: Normal exam.  No lesions.  Perineum: Normal exam.  No lesions.    WET PREP: clue cells: absent, KOH (yeast): positive, odor: absent and trichomoniasis: negative Ph:  < 4.5    Assessment:    G0P0000 Patient Active Problem List   Diagnosis Date Noted  . Moderate episode of recurrent major depressive disorder ([place]) 12/20/2018  . Iron deficiency anemia 12/19/2018  . Type A blood, Rh positive 08/22/2017  . S/P vaginal hysterectomy 07/31/2017  . Recurrent vaginitis 01/10/2017  . Hemorrhoids, external without complications 35/70/1779  . Nausea in adult patient 02/24/2016  . Endometriosis determined by laparoscopy 01/25/2016  . Chronic constipation 08/14/2015  . Monilial vaginitis 08/14/2015  . Bilateral ganglion cysts of wrists 08/14/2015  . Mild mitral regurgitation 05/19/2015  . B12 deficiency 05/18/2015  . Asthma, moderate 05/18/2015  . Anxiety 05/18/2015  . Insomnia, persistent 05/18/2015  . Eczema of hand 05/18/2015  . Viral keratitis 05/18/2015  . Migraine without aura and without status migrainosus, not intractable 05/18/2015  . Numerous moles 05/18/2015  . Allergic rhinitis 05/18/2015  . History of corneal transplant 05/18/2015  . Gastroesophageal reflux disease without esophagitis 12/16/2009     1. Monilial vulvovaginitis   2. Frequent urination        Plan:            1.  Diflucan Orders Orders  Placed This Encounter  Procedures  . POC Urinalysis Dipstick OB     Meds ordered this encounter  Medications  . fluconazole (DIFLUCAN) 150 MG tablet    Sig: Take 1 tablet (150 mg total) by mouth once for 1 dose. Can take additional dose three days later if symptoms persist    Dispense:  1 tablet    Refill:  2      F/U  Return for Annual Physical. I spent 16 minutes involved in the care of this patient of which greater than 50% was spent discussing monilia, use of probiotics, causes of frequent monilia, treatment of  symptoms.  All questions answered.  Finis Bud, M.D. 05/21/2019 10:03 AM

## 2019-05-21 NOTE — Progress Notes (Signed)
Patient comes in today for GYN appointment. Patient is having frequent urination and burning. This started yesterday.

## 2019-06-10 ENCOUNTER — Other Ambulatory Visit: Payer: Self-pay | Admitting: *Deleted

## 2019-06-10 DIAGNOSIS — D509 Iron deficiency anemia, unspecified: Secondary | ICD-10-CM

## 2019-06-11 ENCOUNTER — Other Ambulatory Visit: Payer: Self-pay

## 2019-06-11 ENCOUNTER — Inpatient Hospital Stay: Payer: BC Managed Care – PPO | Attending: Oncology

## 2019-06-11 DIAGNOSIS — D509 Iron deficiency anemia, unspecified: Secondary | ICD-10-CM

## 2019-06-11 DIAGNOSIS — E538 Deficiency of other specified B group vitamins: Secondary | ICD-10-CM

## 2019-06-11 LAB — CBC WITH DIFFERENTIAL/PLATELET
Abs Immature Granulocytes: 0 10*3/uL (ref 0.00–0.07)
Basophils Absolute: 0.1 10*3/uL (ref 0.0–0.1)
Basophils Relative: 1 %
Eosinophils Absolute: 0.1 10*3/uL (ref 0.0–0.5)
Eosinophils Relative: 2 %
HCT: 35.7 % — ABNORMAL LOW (ref 36.0–46.0)
Hemoglobin: 11.5 g/dL — ABNORMAL LOW (ref 12.0–15.0)
Immature Granulocytes: 0 %
Lymphocytes Relative: 41 %
Lymphs Abs: 2.1 10*3/uL (ref 0.7–4.0)
MCH: 30.6 pg (ref 26.0–34.0)
MCHC: 32.2 g/dL (ref 30.0–36.0)
MCV: 94.9 fL (ref 80.0–100.0)
Monocytes Absolute: 0.4 10*3/uL (ref 0.1–1.0)
Monocytes Relative: 7 %
Neutro Abs: 2.5 10*3/uL (ref 1.7–7.7)
Neutrophils Relative %: 49 %
Platelets: 171 10*3/uL (ref 150–400)
RBC: 3.76 MIL/uL — ABNORMAL LOW (ref 3.87–5.11)
RDW: 11.9 % (ref 11.5–15.5)
WBC: 5.1 10*3/uL (ref 4.0–10.5)
nRBC: 0 % (ref 0.0–0.2)

## 2019-06-11 LAB — VITAMIN B12: Vitamin B-12: 135 pg/mL — ABNORMAL LOW (ref 180–914)

## 2019-06-11 LAB — IRON AND TIBC
Iron: 96 ug/dL (ref 28–170)
Saturation Ratios: 33 % — ABNORMAL HIGH (ref 10.4–31.8)
TIBC: 290 ug/dL (ref 250–450)
UIBC: 194 ug/dL

## 2019-06-11 LAB — FERRITIN: Ferritin: 220 ng/mL (ref 11–307)

## 2019-06-12 DIAGNOSIS — Z947 Corneal transplant status: Secondary | ICD-10-CM | POA: Diagnosis not present

## 2019-06-12 DIAGNOSIS — H2511 Age-related nuclear cataract, right eye: Secondary | ICD-10-CM | POA: Diagnosis not present

## 2019-06-12 DIAGNOSIS — B0052 Herpesviral keratitis: Secondary | ICD-10-CM | POA: Diagnosis not present

## 2019-06-13 ENCOUNTER — Inpatient Hospital Stay: Payer: BC Managed Care – PPO | Attending: Oncology | Admitting: Oncology

## 2019-06-13 ENCOUNTER — Other Ambulatory Visit: Payer: Self-pay

## 2019-06-13 ENCOUNTER — Encounter: Payer: Self-pay | Admitting: Oncology

## 2019-06-13 DIAGNOSIS — D509 Iron deficiency anemia, unspecified: Secondary | ICD-10-CM

## 2019-06-13 DIAGNOSIS — E538 Deficiency of other specified B group vitamins: Secondary | ICD-10-CM | POA: Diagnosis not present

## 2019-06-13 NOTE — Progress Notes (Signed)
Patient contacted for telehealth visit. Pt states that she feels like she has no energy.

## 2019-06-16 NOTE — Progress Notes (Signed)
I connected with Jessica Carroll on 06/16/19 at  9:30 AM EDT by video enabled telemedicine visit and verified that I am speaking with the correct person using two identifiers.   I discussed the limitations, risks, security and privacy concerns of performing an evaluation and management service by telemedicine and the availability of in-person appointments. I also discussed with the patient that there may be a patient responsible charge related to this service. The patient expressed understanding and agreed to proceed.  Other persons participating in the visit and their role in the encounter:  none  Patient's location:  home Provider's location:  work  Risk analyst Complaint:  Routine f/u of iron deficiency anemia  History of present illness: patient is a 48 year old female with a past medical history significant for reflux anxiety, and other medical problems. She has been referred to [place] for normocytic anemia. Her hemoglobin has been around 11-11.5 over the last 3 years. Her most recent CBC from 10/17/2018 showed white count of 5, H&H of 10.8/32.5 with an MCV of 89.5 and a platelet count of 187. B12 levels were low normal at 312. Iron studies were normal except for a low normal ferritin of 37. Patient states that she has chronic intermittent nausea and abdominal pain which makes it difficult for her to eat. She has seen Dr. Vicente Males from GI in the past and underwent EGD and colonoscopy as well. She reports feeling fatigued. Denies other complaints her appetite is good and her weight has fluctuated a little but overall remained stable  Results of blood work from 12/06/2018 were as follows:CBC showed white count of 5.3, H&H of 11.8/36.6 and a platelet count of 210.  CMP was within normal limits.  Folate and reticulocyte count was normal.  TSH LDH and haptoglobin was normal.  Myeloma panel showed no M protein.  Ferritin levels were low at 24.  Iron studies were normal.  ESR was normal at 9.  Patient received  2 doses of feraheme  Interval history: Patient reports feeling better in terms of her energy levels after receiving IV iron. Still has fatigue   Review of Systems  Constitutional: Positive for malaise/fatigue. Negative for chills, fever and weight loss.  HENT: Negative for congestion, ear discharge and nosebleeds.   Eyes: Negative for blurred vision.  Respiratory: Negative for cough, hemoptysis, sputum production, shortness of breath and wheezing.   Cardiovascular: Negative for chest pain, palpitations, orthopnea and claudication.  Gastrointestinal: Negative for abdominal pain, blood in stool, constipation, diarrhea, heartburn, melena, nausea and vomiting.  Genitourinary: Negative for dysuria, flank pain, frequency, hematuria and urgency.  Musculoskeletal: Negative for back pain, joint pain and myalgias.  Skin: Negative for rash.  Neurological: Negative for dizziness, tingling, focal weakness, seizures, weakness and headaches.  Endo/Heme/Allergies: Does not bruise/bleed easily.  Psychiatric/Behavioral: Negative for depression and suicidal ideas. The patient does not have insomnia.     Allergies  Allergen Reactions  . Bactrim [Sulfamethoxazole-Trimethoprim] Nausea Only  . Penicillins Nausea Only    Past Medical History:  Diagnosis Date  . Anxiety   . Asthma 2011  . Depression   . Diffuse cystic mastopathy   . Endometriosis   . GERD (gastroesophageal reflux disease)   . Headache     Past Surgical History:  Procedure Laterality Date  . ABDOMINAL HYSTERECTOMY    . COLONOSCOPY WITH PROPOFOL N/A 09/29/2017   Procedure: COLONOSCOPY WITH PROPOFOL;  Surgeon: Jonathon Bellows, MD;  Location: [facility] ENDOSCOPY;  Service: Gastroenterology;  Laterality: N/A;  . CORNEAL TRANSPLANT Right 01/07/2013  .  ESOPHAGOGASTRODUODENOSCOPY (EGD) WITH PROPOFOL N/A 09/29/2017   Procedure: ESOPHAGOGASTRODUODENOSCOPY (EGD) WITH PROPOFOL;  Surgeon: Jonathon Bellows, MD;  Location: [facility] ENDOSCOPY;  Service:  Gastroenterology;  Laterality: N/A;  . EYE SURGERY    . eyelid surgery Right June 19, 2015   [facility] by Dr. Norlene Duel  . LAPAROSCOPIC BILATERAL SALPINGECTOMY N/A 01/25/2016   Procedure: LAPAROSCOPIC BILATERAL SALPINGECTOMY, ADHESIOLYSIS, LAPAROSCOPIC EXCISION/FULGERATION ENDOMETRIOSIS ;  Surgeon: [name], MD;  Location: [facility] ORS;  Service: Gynecology;  Laterality: N/A;  . Laparoscopic excision and fulguration of endometriosis N/A    bladder flap and uterosacral ligament endometriosis, excised and cauterized  . UPPER GI ENDOSCOPY  07/19/2012  . VAGINAL HYSTERECTOMY N/A 07/31/2017   Procedure: HYSTERECTOMY VAGINAL;  Surgeon: [name], MD;  Location: [facility] ORS;  Service: Gynecology;  Laterality: N/A;    Social History   Socioeconomic History  . Marital status: Single    Spouse name: Not on file  . Number of children: 0  . Years of education: 8  . Highest education level: High school graduate  Occupational History  . Occupation: Education officer, community  . Financial resource strain: Patient refused  . Food insecurity    Worry: Never true    Inability: Never true  . Transportation needs    Medical: No    Non-medical: No  Tobacco Use  . Smoking status: Never Smoker  . Smokeless tobacco: Never Used  Substance and Sexual Activity  . Alcohol use: Not Currently    Alcohol/week: 0.0 standard drinks    Comment: occ. wine  . Drug use: No  . Sexual activity: Yes    Partners: Male    Birth control/protection: None, Surgical  Lifestyle  . Physical activity    Days per week: 5 days    Minutes per session: 150+ min  . Stress: To some extent  Relationships  . Social connections    Talks on phone: More than three times a week    Gets together: Once a week    Attends religious service: More than 4 times per year    Active member of club or organization: No    Attends meetings of clubs or organizations: Never    Relationship status: Never married  . Intimate  partner violence    Fear of current or ex partner: No    Emotionally abused: No    Physically abused: No    Forced sexual activity: No  Other Topics Concern  . Not on file  Social History Narrative   Engaged to get married to her boyfriend of 14 years.    Family History  Adopted: Yes  Problem Relation Age of Onset  . Hyperlipidemia Mother   . Hypertension Mother   . Diabetes Father   . Heart failure Father 50       hear attack  . Cancer Neg Hx   . Breast cancer Neg Hx      Current Outpatient Medications:  .  albuterol (PROVENTIL HFA) 108 (90 Base) MCG/ACT inhaler, Inhale 2 puffs into the lungs every 6 (six) hours as needed for wheezing or shortness of breath., Disp: 1 Inhaler, Rfl: 2 .  ALPRAZolam (XANAX) 0.5 MG tablet, Take 1 tablet (0.5 mg total) by mouth 2 (two) times daily as needed for anxiety., Disp: 20 tablet, Rfl: 0 .  BREO ELLIPTA 100-25 MCG/INH AEPB, INHALE 1 PUFF INTO THE LUNGS EVERY DAY, Disp: 60 each, Rfl: 5 .  dexlansoprazole (DEXILANT) 60 MG capsule, Take 1 capsule (60 mg  total) by mouth daily., Disp: 30 capsule, Rfl: 2 .  estradiol (ESTRACE) 1 MG tablet, TAKE 1 AND 1/2 TABLETS BY MOUTH DAILY, Disp: 45 tablet, Rfl: 2 .  ibuprofen (ADVIL,MOTRIN) 800 MG tablet, Take 1 tablet (800 mg total) every 8 (eight) hours as needed by mouth., Disp: 30 tablet, Rfl: 1 .  ketotifen (ZADITOR) 0.025 % ophthalmic solution, Place 1 drop into the right eye daily., Disp: , Rfl:  .  MULTIPLE VITAMIN PO, Take 1 tablet by mouth daily., Disp: , Rfl:  .  Plecanatide (TRULANCE) 3 MG TABS, Take 1 tablet by mouth daily., Disp: 90 tablet, Rfl: 1 .  prednisoLONE acetate (PRED FORTE) 1 % ophthalmic suspension, Place 1 drop into the right eye daily. , Disp: , Rfl:  .  promethazine (PHENERGAN) 12.5 MG tablet, Take 1 tablet (12.5 mg total) by mouth every 8 (eight) hours as needed for nausea or vomiting., Disp: 20 tablet, Rfl: 0 .  rizatriptan (MAXALT) 10 MG tablet, Take 1 tablet (10 mg total) by mouth  2 (two) times daily as needed., Disp: 10 tablet, Rfl: 2 .  triamcinolone cream (KENALOG) 0.1 %, Apply 1 application topically 2 (two) times daily as needed., Disp: 30 g, Rfl: 0 .  cyclobenzaprine (FLEXERIL) 10 MG tablet, Take 0.5-1 tablets (5-10 mg total) by mouth at bedtime. (Patient not taking: Reported on 06/13/2019), Disp: 30 tablet, Rfl: 0 .  fluconazole (DIFLUCAN) 150 MG tablet, Take 1 tablet (150 mg total) by mouth daily. (Patient not taking: Reported on 06/13/2019), Disp: 1 tablet, Rfl: 0 .  L-Theanine 100 MG CAPS, Take 2 capsules by mouth daily. With GABA and lemom balm (Patient not taking: Reported on 06/13/2019), Disp: 60 capsule, Rfl: 0 .  Menthol, Topical Analgesic, (BIOFREEZE) 4 % GEL, Apply 2 g topically 4 (four) times daily. (Patient not taking: Reported on 06/13/2019), Disp: 1 Tube, Rfl: 0 .  predniSONE (DELTASONE) 10 MG tablet, Take 1 tablet (10 mg total) by mouth 2 (two) times daily with a meal. (Patient not taking: Reported on 06/13/2019), Disp: 10 tablet, Rfl: 0 .  Travoprost, BAK Free, (TRAVATAN) 0.004 % SOLN ophthalmic solution, Place 1 drop into the right eye at bedtime., Disp: , Rfl:  .  valACYclovir (VALTREX) 500 MG tablet, Take 1 tablet by mouth 2 (two) times daily., Disp: , Rfl: 0  No results found.  No images are attached to the encounter.   CMP Latest Ref Rng & Units 12/06/2018  Glucose 70 - 99 mg/dL 87  BUN 6 - 20 mg/dL 13  Creatinine 0.44 - 1.00 mg/dL 0.65  Sodium 135 - 145 mmol/L 138  Potassium 3.5 - 5.1 mmol/L 4.3  Chloride 98 - 111 mmol/L 104  CO2 22 - 32 mmol/L 27  Calcium 8.9 - 10.3 mg/dL 9.6  Total Protein 6.5 - 8.1 g/dL 7.8  Total Bilirubin 0.3 - 1.2 mg/dL 1.2  Alkaline Phos 38 - 126 U/L 44  AST 15 - 41 U/L 18  ALT 0 - 44 U/L 15   CBC Latest Ref Rng & Units 06/11/2019  WBC 4.0 - 10.5 K/uL 5.1  Hemoglobin 12.0 - 15.0 g/dL 11.5(L)  Hematocrit 36.0 - 46.0 % 35.7(L)  Platelets 150 - 400 K/uL 171     Observation/objective:appears in no acute distress over  video visit today. Breathing is non labored.  Assessment and plan:Patient is a 48 yr old female with iron and b12 deficiency anemia  She stil has mild anemia. Iron studies are normal. She does not need IV iron  at this time. B12 leevsl are low. She recoently came off b12 injections. She wants to try po b12 and will take 1000 mcg daily.   Follow-up instructions: Repeat cbc ferritin and iron studies in 3 and 6 months. I will see her in 6 months. Repeat b12 levels in 3 months  I discussed the assessment and treatment plan with the patient. The patient was provided an opportunity to ask questions and all were answered. The patient agreed with the plan and demonstrated an understanding of the instructions.   The patient was advised to call back or seek an in-person evaluation if the symptoms worsen or if the condition fails to improve as anticipated.    Visit Diagnosis: 1. Iron deficiency anemia, unspecified iron deficiency anemia type   2. B12 deficiency     Dr. Randa Evens, MD, MPH [facility] at [facility] [phone] 06/16/2019 12:21 PM

## 2019-06-18 ENCOUNTER — Other Ambulatory Visit: Payer: Self-pay | Admitting: Family Medicine

## 2019-06-23 ENCOUNTER — Other Ambulatory Visit: Payer: Self-pay | Admitting: Family Medicine

## 2019-06-23 DIAGNOSIS — K219 Gastro-esophageal reflux disease without esophagitis: Secondary | ICD-10-CM

## 2019-06-25 ENCOUNTER — Ambulatory Visit (INDEPENDENT_AMBULATORY_CARE_PROVIDER_SITE_OTHER): Payer: BC Managed Care – PPO | Admitting: Family Medicine

## 2019-06-25 ENCOUNTER — Encounter: Payer: Self-pay | Admitting: Family Medicine

## 2019-06-25 ENCOUNTER — Other Ambulatory Visit: Payer: Self-pay

## 2019-06-25 DIAGNOSIS — B379 Candidiasis, unspecified: Secondary | ICD-10-CM | POA: Diagnosis not present

## 2019-06-25 DIAGNOSIS — T3695XA Adverse effect of unspecified systemic antibiotic, initial encounter: Secondary | ICD-10-CM | POA: Diagnosis not present

## 2019-06-25 DIAGNOSIS — J0100 Acute maxillary sinusitis, unspecified: Secondary | ICD-10-CM | POA: Diagnosis not present

## 2019-06-25 MED ORDER — FLUCONAZOLE 150 MG PO TABS: 150.0000 mg | ORAL_TABLET | ORAL | 0 refills | Status: DC

## 2019-06-25 MED ORDER — AZITHROMYCIN 500 MG PO TABS: 500.0000 mg | ORAL_TABLET | Freq: Every day | ORAL | 0 refills | Status: DC

## 2019-06-25 NOTE — Progress Notes (Signed)
Name: Jessica Carroll   MRN: [id number]    DOB: 1971-03-24   Date:06/25/2019       Progress Note  Subjective  Chief Complaint  Chief Complaint  Patient presents with  . Sinusitis    x 4 days hurts behind her eyes and her teeth is hurting. Taking tylenol pm and allergies no relief, denies fever.    I connected with  [name]  on 06/25/19 at  1:20 PM EDT by a video enabled telemedicine application and verified that I am speaking with the correct person using two identifiers.  I discussed the limitations of evaluation and management by telemedicine and the availability of in person appointments. The patient expressed understanding and agreed to proceed. Staff also discussed with the patient that there may be a patient responsible charge related to this service. Patient Location: at home  Provider Location: [facility]   HPI  Sinusitis: she states symptoms started 4 days ago with headache, followed by nasal congestion, post-nasal drainage, no fever or chills. Today symptoms are worse and hurting on her upper teeth. No change in appetite or nausea. No rashes. Pain on her face is described as soreness, does not feel like her migraine episodes No cough or wheezing. Tried otc medication without help    Patient Active Problem List   Diagnosis Date Noted  . Moderate episode of recurrent major depressive disorder ([place]) 12/20/2018  . Iron deficiency anemia 12/19/2018  . Type A blood, Rh positive 08/22/2017  . S/P vaginal hysterectomy 07/31/2017  . Recurrent vaginitis 01/10/2017  . Hemorrhoids, external without complications 51/70/0174  . Nausea in adult patient 02/24/2016  . Endometriosis determined by laparoscopy 01/25/2016  . Chronic constipation 08/14/2015  . Monilial vaginitis 08/14/2015  . Bilateral ganglion cysts of wrists 08/14/2015  . Mild mitral regurgitation 05/19/2015  . B12 deficiency 05/18/2015  . Asthma, moderate 05/18/2015  . Anxiety 05/18/2015  .  Insomnia, persistent 05/18/2015  . Eczema of hand 05/18/2015  . Viral keratitis 05/18/2015  . Migraine without aura and without status migrainosus, not intractable 05/18/2015  . Numerous moles 05/18/2015  . Allergic rhinitis 05/18/2015  . History of corneal transplant 05/18/2015  . Gastroesophageal reflux disease without esophagitis 12/16/2009    Past Surgical History:  Procedure Laterality Date  . ABDOMINAL HYSTERECTOMY    . COLONOSCOPY WITH PROPOFOL N/A 09/29/2017   Procedure: COLONOSCOPY WITH PROPOFOL;  Surgeon: Jonathon Bellows, MD;  Location: [facility] ENDOSCOPY;  Service: Gastroenterology;  Laterality: N/A;  . CORNEAL TRANSPLANT Right 01/07/2013  . ESOPHAGOGASTRODUODENOSCOPY (EGD) WITH PROPOFOL N/A 09/29/2017   Procedure: ESOPHAGOGASTRODUODENOSCOPY (EGD) WITH PROPOFOL;  Surgeon: Jonathon Bellows, MD;  Location: [facility] ENDOSCOPY;  Service: Gastroenterology;  Laterality: N/A;  . EYE SURGERY    . eyelid surgery Right June 19, 2015   [facility] by Dr. Norlene Duel  . LAPAROSCOPIC BILATERAL SALPINGECTOMY N/A 01/25/2016   Procedure: LAPAROSCOPIC BILATERAL SALPINGECTOMY, ADHESIOLYSIS, LAPAROSCOPIC EXCISION/FULGERATION ENDOMETRIOSIS ;  Surgeon: [name], MD;  Location: [facility] ORS;  Service: Gynecology;  Laterality: N/A;  . Laparoscopic excision and fulguration of endometriosis N/A    bladder flap and uterosacral ligament endometriosis, excised and cauterized  . UPPER GI ENDOSCOPY  07/19/2012  . VAGINAL HYSTERECTOMY N/A 07/31/2017   Procedure: HYSTERECTOMY VAGINAL;  Surgeon: [name], MD;  Location: [facility] ORS;  Service: Gynecology;  Laterality: N/A;    Family History  Adopted: Yes  Problem Relation Age of Onset  . Hyperlipidemia Mother   . Hypertension Mother   . Diabetes  Father   . Heart failure Father 14       hear attack  . Cancer Neg Hx   . Breast cancer Neg Hx     Social History   Socioeconomic History  . Marital status: Single    Spouse name: Not on file  . Number of  children: 0  . Years of education: 78  . Highest education level: High school graduate  Occupational History  . Occupation: Education officer, community  . Financial resource strain: Patient refused  . Food insecurity    Worry: Never true    Inability: Never true  . Transportation needs    Medical: No    Non-medical: No  Tobacco Use  . Smoking status: Never Smoker  . Smokeless tobacco: Never Used  Substance and Sexual Activity  . Alcohol use: Not Currently    Alcohol/week: 0.0 standard drinks    Comment: occ. wine  . Drug use: No  . Sexual activity: Yes    Partners: Male    Birth control/protection: None, Surgical  Lifestyle  . Physical activity    Days per week: 5 days    Minutes per session: 150+ min  . Stress: To some extent  Relationships  . Social connections    Talks on phone: More than three times a week    Gets together: Once a week    Attends religious service: More than 4 times per year    Active member of club or organization: No    Attends meetings of clubs or organizations: Never    Relationship status: Never married  . Intimate partner violence    Fear of current or ex partner: No    Emotionally abused: No    Physically abused: No    Forced sexual activity: No  Other Topics Concern  . Not on file  Social History Narrative   Engaged to get married to her boyfriend of 14 years.     Current Outpatient Medications:  .  albuterol (PROVENTIL HFA) 108 (90 Base) MCG/ACT inhaler, Inhale 2 puffs into the lungs every 6 (six) hours as needed for wheezing or shortness of breath., Disp: 1 Inhaler, Rfl: 2 .  ALPRAZolam (XANAX) 0.5 MG tablet, Take 1 tablet (0.5 mg total) by mouth 2 (two) times daily as needed for anxiety., Disp: 20 tablet, Rfl: 0 .  BREO ELLIPTA 100-25 MCG/INH AEPB, INHALE 1 PUFF INTO THE LUNGS EVERY DAY, Disp: 60 each, Rfl: 0 .  cyclobenzaprine (FLEXERIL) 10 MG tablet, Take 0.5-1 tablets (5-10 mg total) by mouth at bedtime. (Patient not taking: Reported  on 06/13/2019), Disp: 30 tablet, Rfl: 0 .  dexlansoprazole (DEXILANT) 60 MG capsule, Take 1 capsule (60 mg total) by mouth daily., Disp: 30 capsule, Rfl: 2 .  estradiol (ESTRACE) 1 MG tablet, TAKE 1 AND 1/2 TABLETS BY MOUTH DAILY, Disp: 45 tablet, Rfl: 2 .  fluconazole (DIFLUCAN) 150 MG tablet, Take 1 tablet (150 mg total) by mouth daily. (Patient not taking: Reported on 06/13/2019), Disp: 1 tablet, Rfl: 0 .  ibuprofen (ADVIL,MOTRIN) 800 MG tablet, Take 1 tablet (800 mg total) every 8 (eight) hours as needed by mouth., Disp: 30 tablet, Rfl: 1 .  ketotifen (ZADITOR) 0.025 % ophthalmic solution, Place 1 drop into the right eye daily., Disp: , Rfl:  .  L-Theanine 100 MG CAPS, Take 2 capsules by mouth daily. With GABA and lemom balm (Patient not taking: Reported on 06/13/2019), Disp: 60 capsule, Rfl: 0 .  Menthol, Topical Analgesic, (BIOFREEZE) 4 %  GEL, Apply 2 g topically 4 (four) times daily. (Patient not taking: Reported on 06/13/2019), Disp: 1 Tube, Rfl: 0 .  MULTIPLE VITAMIN PO, Take 1 tablet by mouth daily., Disp: , Rfl:  .  Plecanatide (TRULANCE) 3 MG TABS, Take 1 tablet by mouth daily., Disp: 90 tablet, Rfl: 1 .  prednisoLONE acetate (PRED FORTE) 1 % ophthalmic suspension, Place 1 drop into the right eye daily. , Disp: , Rfl:  .  predniSONE (DELTASONE) 10 MG tablet, Take 1 tablet (10 mg total) by mouth 2 (two) times daily with a meal. (Patient not taking: Reported on 06/13/2019), Disp: 10 tablet, Rfl: 0 .  promethazine (PHENERGAN) 12.5 MG tablet, Take 1 tablet (12.5 mg total) by mouth every 8 (eight) hours as needed for nausea or vomiting., Disp: 20 tablet, Rfl: 0 .  rizatriptan (MAXALT) 10 MG tablet, Take 1 tablet (10 mg total) by mouth 2 (two) times daily as needed., Disp: 10 tablet, Rfl: 2 .  Travoprost, BAK Free, (TRAVATAN) 0.004 % SOLN ophthalmic solution, Place 1 drop into the right eye at bedtime., Disp: , Rfl:  .  triamcinolone cream (KENALOG) 0.1 %, Apply 1 application topically 2 (two) times daily  as needed., Disp: 30 g, Rfl: 0 .  valACYclovir (VALTREX) 500 MG tablet, Take 1 tablet by mouth 2 (two) times daily., Disp: , Rfl: 0  Allergies  Allergen Reactions  . Bactrim [Sulfamethoxazole-Trimethoprim] Nausea Only  . Penicillins Nausea Only    I personally reviewed active problem list, medication list, allergies, family history with the patient/caregiver today.   ROS  Ten systems reviewed and is negative except as mentioned in HPI   Objective  Virtual encounter, vitals not obtained.  There is no height or weight on file to calculate BMI.  Physical Exam  Awake, alert and oriented Tender during palpation of her sinus    PHQ2/9: Depression screen [facility] 2/9 06/25/2019 01/11/2019 12/20/2018 10/17/2018 09/25/2018  Decreased Interest 1 1 2 2  0  Down, Depressed, Hopeless 1 1 1 2  0  PHQ - 2 Score 2 2 3 4  0  Altered sleeping 3 3 2 3 2   Tired, decreased energy 0 0 2 2 3   Change in appetite 0 1 1 2 1   Feeling bad or failure about yourself  0 0 0 0 0  Trouble concentrating 0 0 0 0 0  Moving slowly or fidgety/restless 0 0 0 0 0  Suicidal thoughts 0 0 0 0 0  PHQ-9 Score 5 6 8 11 6   Difficult doing work/chores Not difficult at all Somewhat difficult - - -  Some recent data might be hidden   PHQ-2/9 Result is positive.    Fall Risk: Fall Risk  06/25/2019 12/20/2018 12/20/2018 10/17/2018 09/25/2018  Falls in the past year? 0 0 0 0 No  Number falls in past yr: 0 0 0 - -  Injury with Fall? 0 0 0 - -  Risk for fall due to : - - - - -     Assessment & Plan  1. Acute non-recurrent maxillary sinusitis  - azithromycin (ZITHROMAX) 500 MG tablet; Take 1 tablet (500 mg total) by mouth daily.  Dispense: 3 tablet; Refill: 0  2. Antibiotic-induced yeast infection  - fluconazole (DIFLUCAN) 150 MG tablet; Take 1 tablet (150 mg total) by mouth every other day.  Dispense: 3 tablet; Refill: 0  I discussed the assessment and treatment plan with the patient. The patient was provided an opportunity  to ask questions and all were answered.  The patient agreed with the plan and demonstrated an understanding of the instructions.  The patient was advised to call back or seek an in-person evaluation if the symptoms worsen or if the condition fails to improve as anticipated.  I provided 15  minutes of non-face-to-face time during this encounter.

## 2019-07-01 DIAGNOSIS — S39012A Strain of muscle, fascia and tendon of lower back, initial encounter: Secondary | ICD-10-CM | POA: Diagnosis not present

## 2019-07-03 ENCOUNTER — Telehealth: Payer: Self-pay

## 2019-07-03 NOTE — Telephone Encounter (Signed)
Copied from [place] [phone]. Topic: General - Other >> Jul 03, 2019  8:59 AM Antonieta Iba C wrote: Reason for CRM: pt says that she was prescribed medication for her back at the [facility]. Pt says that she now feels that she has another infection. Pt says that she was prescribed fluconazole (DIFLUCAN) 150 MG tablet before to help. Pt is requesting a Rx.   Pharmacy: [id number] - [place], [address] AT [address] [id number] (Phone) [phone] (Fax)

## 2019-07-12 ENCOUNTER — Ambulatory Visit: Payer: BLUE CROSS/BLUE SHIELD | Admitting: Family Medicine

## 2019-07-12 ENCOUNTER — Other Ambulatory Visit: Payer: Self-pay

## 2019-07-12 ENCOUNTER — Encounter: Payer: Self-pay | Admitting: Family Medicine

## 2019-07-12 VITALS — BP 102/60 | HR 70 | Temp 97.0°F | Resp 16 | Ht 66.0 in | Wt 125.0 lb

## 2019-07-12 DIAGNOSIS — J4540 Moderate persistent asthma, uncomplicated: Secondary | ICD-10-CM

## 2019-07-12 DIAGNOSIS — F341 Dysthymic disorder: Secondary | ICD-10-CM

## 2019-07-12 DIAGNOSIS — R110 Nausea: Secondary | ICD-10-CM

## 2019-07-12 DIAGNOSIS — F419 Anxiety disorder, unspecified: Secondary | ICD-10-CM | POA: Diagnosis not present

## 2019-07-12 DIAGNOSIS — K5909 Other constipation: Secondary | ICD-10-CM

## 2019-07-12 DIAGNOSIS — K219 Gastro-esophageal reflux disease without esophagitis: Secondary | ICD-10-CM

## 2019-07-12 DIAGNOSIS — G43009 Migraine without aura, not intractable, without status migrainosus: Secondary | ICD-10-CM

## 2019-07-12 DIAGNOSIS — E538 Deficiency of other specified B group vitamins: Secondary | ICD-10-CM

## 2019-07-12 MED ORDER — HYDROXYZINE HCL 10 MG PO TABS: 10.0000 mg | ORAL_TABLET | Freq: Three times a day (TID) | ORAL | 1 refills | Status: DC | PRN

## 2019-07-12 MED ORDER — BREO ELLIPTA 100-25 MCG/INH IN AEPB: INHALATION_SPRAY | RESPIRATORY_TRACT | 5 refills | Status: DC

## 2019-07-12 MED ORDER — ALPRAZOLAM 0.5 MG PO TABS: 0.5000 mg | ORAL_TABLET | Freq: Two times a day (BID) | ORAL | 0 refills | Status: DC | PRN

## 2019-07-12 MED ORDER — PROMETHAZINE HCL 12.5 MG PO TABS: 12.5000 mg | ORAL_TABLET | Freq: Three times a day (TID) | ORAL | 0 refills | Status: DC | PRN

## 2019-07-12 MED ORDER — DEXILANT 60 MG PO CPDR: 60.0000 mg | DELAYED_RELEASE_CAPSULE | Freq: Every day | ORAL | 5 refills | Status: DC

## 2019-07-12 NOTE — Progress Notes (Signed)
Name: Jessica Carroll   MRN: [id number]    DOB: [birth year]   Date:07/12/2019       Progress Note  Subjective  Chief Complaint  Chief Complaint  Patient presents with  . Medication Refill    6 month F/U  . Asthma  . Migraine  . Constipation  . Anxiety  . Gastroesophageal Reflux    HPI  Asthma:she is now on Breo. She has SOB with moderate activity.She states she has an occasional dry cough, no wheezing or SOB recently   GERD/dysphagia:seen by Dr. Vicente Males, had EGD and colonoscopy showed hemorrhoidsbut normal EGD. She is on Dexilant,Gallbladder US showed one cyst, normal HiDA scan, she continues to have some bloating after meals. No blood in stools.   Iron deficiency and B12 deficiency: Symptoms improved since she got iron infusion. Seen hematologist and is on B12 SL  Migraine: no recent episodes , she is not sure when she had an episode. Headache is described as throbbing, usually frontal or nuchal, associated with photophobia, and phonophobia. She has nausea, no vomiting with episodes.Taking medication prn Maxalt and promethazine prn   Chronic constipation: she states she has daily bowel movement with medication,no straining or blood in stools. [place] # 3 or 4 with Trulance, used to be on Linzess but no longer covered by insurance  Anxiety: shealways worrying about something. She was worried about losing her job in the beginning of pandemic, however now feeling over- worked, having to work 10 hours day , 7 days a week Discussed Hydroxyzine    Patient Active Problem List   Diagnosis Date Noted  . Moderate episode of recurrent major depressive disorder ([place]) 12/20/2018  . Iron deficiency anemia 12/19/2018  . Type A blood, Rh positive 08/22/2017  . S/P vaginal hysterectomy 07/31/2017  . Recurrent vaginitis 01/10/2017  . Hemorrhoids, external without complications 87/56/4332  . Nausea in adult patient 02/24/2016  . Endometriosis determined by laparoscopy 01/25/2016  .  Chronic constipation 08/14/2015  . Monilial vaginitis 08/14/2015  . Bilateral ganglion cysts of wrists 08/14/2015  . Mild mitral regurgitation 05/19/2015  . B12 deficiency 05/18/2015  . Asthma, moderate 05/18/2015  . Anxiety 05/18/2015  . Insomnia, persistent 05/18/2015  . Eczema of hand 05/18/2015  . Viral keratitis 05/18/2015  . Migraine without aura and without status migrainosus, not intractable 05/18/2015  . Numerous moles 05/18/2015  . Allergic rhinitis 05/18/2015  . History of corneal transplant 05/18/2015  . Gastroesophageal reflux disease without esophagitis 12/16/2009    Past Surgical History:  Procedure Laterality Date  . ABDOMINAL HYSTERECTOMY    . COLONOSCOPY WITH PROPOFOL N/A 09/29/2017   Procedure: COLONOSCOPY WITH PROPOFOL;  Surgeon: Jonathon Bellows, MD;  Location[facility]COPY;  Service: Gastroenterology;  Laterality: N/A;  . CORNEAL TRANSPLANT Right 01/07/2013  . ESOPHAGOGASTRODUODENOSCOPY (EGD) WITH PROPOFOL N/A 09/29/2017   Procedure: ESOPHAGOGASTRODUODENOSCOPY (EGD) WITH PROPOFOL;  Surgeon: Jonathon Bellows, MD;  Location: [facility] ENDOSCOPY;  Service: Gastroenterology;  Laterality: N/A;  . EYE SURGERY    . eyelid surgery Right June 19, 2015   [facility] by Dr. Norlene Duel  . LAPAROSCOPIC BILATERAL SALPINGECTOMY N/A 01/25/2016   Procedure: LAPAROSCOPIC BILATERAL SALPINGECTOMY, ADHESIOLYSIS, LAPAROSCOPIC EXCISION/FULGERATION ENDOMETRIOSIS ;  Surgeon: [name], MD;  Location: [facility] ORS;  Service: Gynecology;  Laterality: N/A;  . Laparoscopic excision and fulguration of endometriosis N/A    bladder flap and uterosacral ligament endometriosis, excised and cauterized  . UPPER GI ENDOSCOPY  07/19/2012  . VAGINAL HYSTERECTOMY N/A 07/31/2017   Procedure: HYSTERECTOMY VAGINAL;  Surgeon:  Defrancesco, Alanda Slim, MD;  Location: [facility] ORS;  Service: Gynecology;  Laterality: N/A;    Family History  Adopted: Yes  Problem Relation Age of Onset  . Hyperlipidemia Mother   .  Hypertension Mother   . Diabetes Father   . Heart failure Father 27       hear attack  . Cancer Neg Hx   . Breast cancer Neg Hx     Social History   Socioeconomic History  . Marital status: Single    Spouse name: Not on file  . Number of children: 0  . Years of education: 27  . Highest education level: High school graduate  Occupational History  . Occupation: Education officer, community  . Financial resource strain: Patient refused  . Food insecurity    Worry: Never true    Inability: Never true  . Transportation needs    Medical: No    Non-medical: No  Tobacco Use  . Smoking status: Never Smoker  . Smokeless tobacco: Never Used  Substance and Sexual Activity  . Alcohol use: Not Currently    Alcohol/week: 0.0 standard drinks    Comment: occ. wine  . Drug use: No  . Sexual activity: Yes    Partners: Male    Birth control/protection: None, Surgical  Lifestyle  . Physical activity    Days per week: 5 days    Minutes per session: 150+ min  . Stress: To some extent  Relationships  . Social connections    Talks on phone: More than three times a week    Gets together: Once a week    Attends religious service: More than 4 times per year    Active member of club or organization: No    Attends meetings of clubs or organizations: Never    Relationship status: Never married  . Intimate partner violence    Fear of current or ex partner: No    Emotionally abused: No    Physically abused: No    Forced sexual activity: No  Other Topics Concern  . Not on file  Social History Narrative   Engaged to get married to her boyfriend of 14 years.     Current Outpatient Medications:  .  albuterol (PROVENTIL HFA) 108 (90 Base) MCG/ACT inhaler, Inhale 2 puffs into the lungs every 6 (six) hours as needed for wheezing or shortness of breath., Disp: 1 Inhaler, Rfl: 2 .  ALPRAZolam (XANAX) 0.5 MG tablet, Take 1 tablet (0.5 mg total) by mouth 2 (two) times daily as needed for anxiety., Disp:  10 tablet, Rfl: 0 .  dexlansoprazole (DEXILANT) 60 MG capsule, Take 1 capsule (60 mg total) by mouth daily., Disp: 30 capsule, Rfl: 5 .  estradiol (ESTRACE) 1 MG tablet, TAKE 1 AND 1/2 TABLETS BY MOUTH DAILY, Disp: 45 tablet, Rfl: 2 .  fluticasone furoate-vilanterol (BREO ELLIPTA) 100-25 MCG/INH AEPB, INHALE 1 PUFF INTO THE LUNGS EVERY DAY, Disp: 60 each, Rfl: 5 .  ibuprofen (ADVIL,MOTRIN) 800 MG tablet, Take 1 tablet (800 mg total) every 8 (eight) hours as needed by mouth., Disp: 30 tablet, Rfl: 1 .  ketotifen (ZADITOR) 0.025 % ophthalmic solution, Place 1 drop into the right eye daily., Disp: , Rfl:  .  MULTIPLE VITAMIN PO, Take 1 tablet by mouth daily., Disp: , Rfl:  .  Plecanatide (TRULANCE) 3 MG TABS, Take 1 tablet by mouth daily., Disp: 90 tablet, Rfl: 1 .  prednisoLONE acetate (PRED FORTE) 1 % ophthalmic suspension, Place 1 drop into the  right eye daily. , Disp: , Rfl:  .  promethazine (PHENERGAN) 12.5 MG tablet, Take 1 tablet (12.5 mg total) by mouth every 8 (eight) hours as needed for nausea or vomiting., Disp: 20 tablet, Rfl: 0 .  rizatriptan (MAXALT) 10 MG tablet, Take 1 tablet (10 mg total) by mouth 2 (two) times daily as needed., Disp: 10 tablet, Rfl: 2 .  Travoprost, BAK Free, (TRAVATAN) 0.004 % SOLN ophthalmic solution, Place 1 drop into the right eye at bedtime., Disp: , Rfl:  .  triamcinolone cream (KENALOG) 0.1 %, Apply 1 application topically 2 (two) times daily as needed., Disp: 30 g, Rfl: 0 .  valACYclovir (VALTREX) 500 MG tablet, Take 1 tablet by mouth 2 (two) times daily., Disp: , Rfl: 0 .  hydrOXYzine (ATARAX/VISTARIL) 10 MG tablet, Take 1 tablet (10 mg total) by mouth 3 (three) times daily as needed., Disp: 90 tablet, Rfl: 1  Allergies  Allergen Reactions  . Bactrim [Sulfamethoxazole-Trimethoprim] Nausea Only  . Penicillins Nausea Only    I personally reviewed active problem list, medication list, allergies, family history, social history with the patient/caregiver  today.   ROS  Constitutional: Negative for fever or weight change.  Respiratory: Negative for cough and shortness of breath.   Cardiovascular: Negative for chest pain or palpitations.  Gastrointestinal: Negative for abdominal pain, no bowel changes.  Musculoskeletal: Negative for gait problem or joint swelling.  Skin: Negative for rash.  Neurological: Negative for dizziness or headache.  No other specific complaints in a complete review of systems (except as listed in HPI above).  Objective  Vitals:   07/12/19 1527  BP: 102/60  Pulse: 70  Resp: 16  Temp: (!) 97 F (36.1 C)  TempSrc: Temporal  SpO2: 99%  Weight: 125 lb (56.7 kg)  Height: 5\' 6"  (1.676 m)    Body mass index is 20.18 kg/m.  Physical Exam  Constitutional: Patient appears well-developed and well-nourished. No distress.  HEENT: head atraumatic, normocephalic, pupils equal and reactive to light,neck supple Cardiovascular: Normal rate, regular rhythm and normal heart sounds.  No murmur heard. No BLE edema. Pulmonary/Chest: Effort normal and breath sounds normal. No respiratory distress. Abdominal: Soft.  There is no tenderness. Psychiatric: Patient has a normal mood and affect. behavior is normal. Judgment and thought content normal.   Recent Results (from the past 2160 hour(s))  POC Urinalysis Dipstick OB     Status: None   Collection Time: 05/21/19  9:36 AM  Result Value Ref Range   Color, UA yellow    Clarity, UA clear    Glucose, UA Negative Negative   Bilirubin, UA neg    Ketones, UA neg    Spec Grav, UA 1.010 1.010 - 1.025   Blood, UA neg    pH, UA 6.5 5.0 - 8.0   POC,PROTEIN,UA Negative Negative, Trace, Small (1+), Moderate (2+), Large (3+), 4+   Urobilinogen, UA 0.2 0.2 or 1.0 E.U./dL   Nitrite, UA neg    Leukocytes, UA Negative Negative   Appearance     Odor    Iron and TIBC     Status: Abnormal   Collection Time: 06/11/19  9:45 AM  Result Value Ref Range   Iron 96 28 - 170 ug/dL   TIBC  290 250 - 450 ug/dL   Saturation Ratios 33 (H) 10.4 - 31.8 %   UIBC 194 ug/dL    Comment: Performed at [facility], [address] [place], [place] [address]  Ferritin  Status: None   Collection Time: 06/11/19  9:45 AM  Result Value Ref Range   Ferritin 220 11 - 307 ng/mL    Comment: Performed at [facility], [address] [place], [place] [address]  CBC with Differential     Status: Abnormal   Collection Time: 06/11/19  9:45 AM  Result Value Ref Range   WBC 5.1 4.0 - 10.5 K/uL   RBC 3.76 (L) 3.87 - 5.11 MIL/uL   Hemoglobin 11.5 (L) 12.0 - 15.0 g/dL   HCT 35.7 (L) 36.0 - 46.0 %   MCV 94.9 80.0 - 100.0 fL   MCH 30.6 26.0 - 34.0 pg   MCHC 32.2 30.0 - 36.0 g/dL   RDW 11.9 11.5 - 15.5 %   Platelets 171 150 - 400 K/uL   nRBC 0.0 0.0 - 0.2 %   Neutrophils Relative % 49 %   Neutro Abs 2.5 1.7 - 7.7 K/uL   Lymphocytes Relative 41 %   Lymphs Abs 2.1 0.7 - 4.0 K/uL   Monocytes Relative 7 %   Monocytes Absolute 0.4 0.1 - 1.0 K/uL   Eosinophils Relative 2 %   Eosinophils Absolute 0.1 0.0 - 0.5 K/uL   Basophils Relative 1 %   Basophils Absolute 0.1 0.0 - 0.1 K/uL   Immature Granulocytes 0 %   Abs Immature Granulocytes 0.00 0.00 - 0.07 K/uL    Comment: Performed at [facility], [address] [place], [place] [address]  Vitamin B12     Status: Abnormal   Collection Time: 06/11/19  9:46 AM  Result Value Ref Range   Vitamin B-12 135 (L) 180 - 914 pg/mL    Comment: (NOTE) This assay is not validated for testing neonatal or myeloproliferative syndrome specimens for Vitamin B12 levels. Performed at [facility], [place] [address] [place], [place] [address]      PHQ2/9: Depression screen [facility] 2/9 07/12/2019 06/25/2019 01/11/2019 12/20/2018 10/17/2018  Decreased Interest 2 1 1 2 2   Down, Depressed, Hopeless 2 1 1 1 2   PHQ - 2 Score 4 2 2 3 4   Altered sleeping 0 3 3 2 3   Tired, decreased energy 1 0 0 2 2  Change in appetite 1 0 1 1 2   Feeling bad or  failure about yourself  0 0 0 0 0  Trouble concentrating 0 0 0 0 0  Moving slowly or fidgety/restless 0 0 0 0 0  Suicidal thoughts 0 0 0 0 0  PHQ-9 Score 6 5 6 8 11   Difficult doing work/chores Not difficult at all Not difficult at all Somewhat difficult - -  Some recent data might be hidden    phq 9 is positive   Fall Risk: Fall Risk  06/25/2019 12/20/2018 12/20/2018 10/17/2018 09/25/2018  Falls in the past year? 0 0 0 0 No  Number falls in past yr: 0 0 0 - -  Injury with Fall? 0 0 0 - -  Risk for fall due to : - - - - -     Functional Status Survey: Is the patient deaf or have difficulty hearing?: No Does the patient have difficulty seeing, even when wearing glasses/contacts?: No Does the patient have difficulty concentrating, remembering, or making decisions?: No Does the patient have difficulty walking or climbing stairs?: No Does the patient have difficulty dressing or bathing?: No Does the patient have difficulty doing errands alone such as visiting a doctor's office or shopping?: No    Assessment & Plan  1. Anxiety  - ALPRAZolam (XANAX) 0.5 MG tablet; Take 1 tablet (0.5 mg total) by mouth 2 (two) times daily as needed for anxiety.  Dispense: 10 tablet; Refill: 0 - hydrOXYzine (ATARAX/VISTARIL) 10 MG tablet; Take 1 tablet (10 mg total) by mouth 3 (three) times daily as needed.  Dispense: 90 tablet; Refill: 1  2. Nausea  - promethazine (PHENERGAN) 12.5 MG tablet; Take 1 tablet (12.5 mg total) by mouth every 8 (eight) hours as needed for nausea or vomiting.  Dispense: 20 tablet; Refill: 0  3. Migraine without aura and without status migrainosus, not intractable  Doing well at this time  4. Chronic constipation  Taking Trulance   5. Gastroesophageal reflux disease without esophagitis  - dexlansoprazole (DEXILANT) 60 MG capsule; Take 1 capsule (60 mg total) by mouth daily.  Dispense: 30 capsule; Refill: 5  6. B12 deficiency   7. Moderate persistent asthma without  complication  - fluticasone furoate-vilanterol (BREO ELLIPTA) 100-25 MCG/INH AEPB; INHALE 1 PUFF INTO THE LUNGS EVERY DAY  Dispense: 60 each; Refill: 5  8. Dysthymia  Feels overworked and is tired

## 2019-07-15 ENCOUNTER — Other Ambulatory Visit: Payer: Self-pay | Admitting: Family Medicine

## 2019-07-15 MED ORDER — IBUPROFEN 800 MG PO TABS: 800.0000 mg | ORAL_TABLET | Freq: Three times a day (TID) | ORAL | 1 refills | Status: DC | PRN

## 2019-07-15 NOTE — Telephone Encounter (Signed)
ibuprofen (ADVIL,MOTRIN) 800 MG tablet   Send to [employer]

## 2019-07-19 ENCOUNTER — Other Ambulatory Visit: Payer: Self-pay | Admitting: Obstetrics and Gynecology

## 2019-09-06 ENCOUNTER — Other Ambulatory Visit: Payer: Self-pay

## 2019-09-06 ENCOUNTER — Encounter: Payer: Self-pay | Admitting: Family Medicine

## 2019-09-06 ENCOUNTER — Ambulatory Visit: Payer: BC Managed Care – PPO | Admitting: Family Medicine

## 2019-09-06 VITALS — BP 118/70 | HR 60 | Temp 97.1°F | Resp 14 | Ht 65.0 in | Wt 123.6 lb

## 2019-09-06 DIAGNOSIS — Z23 Encounter for immunization: Secondary | ICD-10-CM | POA: Diagnosis not present

## 2019-09-06 DIAGNOSIS — G43009 Migraine without aura, not intractable, without status migrainosus: Secondary | ICD-10-CM | POA: Diagnosis not present

## 2019-09-06 DIAGNOSIS — E538 Deficiency of other specified B group vitamins: Secondary | ICD-10-CM | POA: Diagnosis not present

## 2019-09-06 DIAGNOSIS — Z862 Personal history of diseases of the blood and blood-forming organs and certain disorders involving the immune mechanism: Secondary | ICD-10-CM | POA: Diagnosis not present

## 2019-09-06 DIAGNOSIS — G4709 Other insomnia: Secondary | ICD-10-CM

## 2019-09-06 MED ORDER — PREDNISONE 10 MG PO TABS: 10.0000 mg | ORAL_TABLET | Freq: Two times a day (BID) | ORAL | 0 refills | Status: DC

## 2019-09-06 MED ORDER — TRAZODONE HCL 50 MG PO TABS: 25.0000 mg | ORAL_TABLET | Freq: Every evening | ORAL | 0 refills | Status: DC | PRN

## 2019-09-06 MED ORDER — CYANOCOBALAMIN 1000 MCG/ML IJ SOLN
1000.0000 ug | Freq: Once | INTRAMUSCULAR | Status: AC
  Administered 2019-09-06: 1000 ug via INTRAMUSCULAR

## 2019-09-06 MED ORDER — RIZATRIPTAN BENZOATE 10 MG PO TABS: 10.0000 mg | ORAL_TABLET | Freq: Two times a day (BID) | ORAL | 0 refills | Status: DC | PRN

## 2019-09-06 NOTE — Progress Notes (Signed)
Name: Jessica Carroll   MRN: [id number]    DOB: 03/21/1971   Date:09/06/2019       Progress Note  Subjective  Chief Complaint  Chief Complaint  Patient presents with  . Sinus Problem     started tuesday  . Migraine    started tuesday    HPI  Migraine: she states symptoms started on Tuesday she states started with throbbing sensation on top of her head, and moved to behind her eyes, after that left temporal area, than right temporal area and last night it was pressure on her neck. She states today she noticed some rhinorrhea today. She states she took Nyquil for sinus pressure, Migraine Excedrin, sinus and headache otc. She has been out of migraine medication. She denies any nausea or vomiting. Denies phonophobia or photophobia. She states left side of nose is swollen. She is not sure if sinus infection or migraine episode.   Insomnia: falling asleep but waking up during the night, she states working 6 days a week about 10 hour days, feeling very tired. We will try trazodone  B12 deficiency and history of iron deficiency anemia: she has been feeling very tired. Last iron storage was normal, she used to get iron infusion. She is on B12 SL only. We will check level and give her B12 before she leaves today No paresthesias noticed   Depression: she states likely from long hours of work and also pandemic, does not want any other medication, taking hydroxyzine prn   Patient Active Problem List   Diagnosis Date Noted  . Moderate episode of recurrent major depressive disorder ([place]) 12/20/2018  . Iron deficiency anemia 12/19/2018  . Type A blood, Rh positive 08/22/2017  . S/P vaginal hysterectomy 07/31/2017  . Recurrent vaginitis 01/10/2017  . Hemorrhoids, external without complications 0000000  . Nausea in adult patient 02/24/2016  . Endometriosis determined by laparoscopy 01/25/2016  . Chronic constipation 08/14/2015  . Monilial vaginitis 08/14/2015  . Bilateral ganglion cysts of  wrists 08/14/2015  . Mild mitral regurgitation 05/19/2015  . B12 deficiency 05/18/2015  . Asthma, moderate 05/18/2015  . Anxiety 05/18/2015  . Insomnia, persistent 05/18/2015  . Eczema of hand 05/18/2015  . Viral keratitis 05/18/2015  . Migraine without aura and without status migrainosus, not intractable 05/18/2015  . Numerous moles 05/18/2015  . Allergic rhinitis 05/18/2015  . History of corneal transplant 05/18/2015  . Gastroesophageal reflux disease without esophagitis 12/16/2009    Past Surgical History:  Procedure Laterality Date  . ABDOMINAL HYSTERECTOMY    . COLONOSCOPY WITH PROPOFOL N/A 09/29/2017   Procedure: COLONOSCOPY WITH PROPOFOL;  Surgeon: Jonathon Bellows, MD;  Location: [facility] ENDOSCOPY;  Service: Gastroenterology;  Laterality: N/A;  . CORNEAL TRANSPLANT Right 01/07/2013  . ESOPHAGOGASTRODUODENOSCOPY (EGD) WITH PROPOFOL N/A 09/29/2017   Procedure: ESOPHAGOGASTRODUODENOSCOPY (EGD) WITH PROPOFOL;  Surgeon: Jonathon Bellows, MD;  Location: [facility] ENDOSCOPY;  Service: Gastroenterology;  Laterality: N/A;  . EYE SURGERY    . eyelid surgery Right June 19, 2015   [facility] by Dr. Norlene Duel  . LAPAROSCOPIC BILATERAL SALPINGECTOMY N/A 01/25/2016   Procedure: LAPAROSCOPIC BILATERAL SALPINGECTOMY, ADHESIOLYSIS, LAPAROSCOPIC EXCISION/FULGERATION ENDOMETRIOSIS ;  Surgeon: [name], MD;  Location: [facility] ORS;  Service: Gynecology;  Laterality: N/A;  . Laparoscopic excision and fulguration of endometriosis N/A    bladder flap and uterosacral ligament endometriosis, excised and cauterized  . UPPER GI ENDOSCOPY  07/19/2012  . VAGINAL HYSTERECTOMY N/A 07/31/2017   Procedure: HYSTERECTOMY VAGINAL;  Surgeon: [name], MD;  Location:  [facility] ORS;  Service: Gynecology;  Laterality: N/A;    Family History  Adopted: Yes  Problem Relation Age of Onset  . Hyperlipidemia Mother   . Hypertension Mother   . Diabetes Father   . Heart failure Father 45       hear attack  . Cancer  Neg Hx   . Breast cancer Neg Hx     Social History   Socioeconomic History  . Marital status: Single    Spouse name: Not on file  . Number of children: 0  . Years of education: 84  . Highest education level: High school graduate  Occupational History  . Occupation: Education officer, community  . Financial resource strain: Patient refused  . Food insecurity    Worry: Never true    Inability: Never true  . Transportation needs    Medical: No    Non-medical: No  Tobacco Use  . Smoking status: Never Smoker  . Smokeless tobacco: Never Used  Substance and Sexual Activity  . Alcohol use: Not Currently    Alcohol/week: 0.0 standard drinks    Comment: occ. wine  . Drug use: No  . Sexual activity: Yes    Partners: Male    Birth control/protection: None, Surgical  Lifestyle  . Physical activity    Days per week: 5 days    Minutes per session: 150+ min  . Stress: To some extent  Relationships  . Social connections    Talks on phone: More than three times a week    Gets together: Once a week    Attends religious service: More than 4 times per year    Active member of club or organization: No    Attends meetings of clubs or organizations: Never    Relationship status: Never married  . Intimate partner violence    Fear of current or ex partner: No    Emotionally abused: No    Physically abused: No    Forced sexual activity: No  Other Topics Concern  . Not on file  Social History Narrative   Engaged to get married to her boyfriend of 14 years.     Current Outpatient Medications:  .  albuterol (PROVENTIL HFA) 108 (90 Base) MCG/ACT inhaler, Inhale 2 puffs into the lungs every 6 (six) hours as needed for wheezing or shortness of breath., Disp: 1 Inhaler, Rfl: 2 .  ALPRAZolam (XANAX) 0.5 MG tablet, Take 1 tablet (0.5 mg total) by mouth 2 (two) times daily as needed for anxiety., Disp: 10 tablet, Rfl: 0 .  dexlansoprazole (DEXILANT) 60 MG capsule, Take 1 capsule (60 mg total) by  mouth daily., Disp: 30 capsule, Rfl: 5 .  fluticasone furoate-vilanterol (BREO ELLIPTA) 100-25 MCG/INH AEPB, INHALE 1 PUFF INTO THE LUNGS EVERY DAY, Disp: 60 each, Rfl: 5 .  hydrOXYzine (ATARAX/VISTARIL) 10 MG tablet, Take 1 tablet (10 mg total) by mouth 3 (three) times daily as needed., Disp: 90 tablet, Rfl: 1 .  ibuprofen (ADVIL) 800 MG tablet, Take 1 tablet (800 mg total) by mouth every 8 (eight) hours as needed., Disp: 30 tablet, Rfl: 1 .  ketotifen (ZADITOR) 0.025 % ophthalmic solution, Place 1 drop into the right eye daily., Disp: , Rfl:  .  MULTIPLE VITAMIN PO, Take 1 tablet by mouth daily., Disp: , Rfl:  .  Plecanatide (TRULANCE) 3 MG TABS, Take 1 tablet by mouth daily., Disp: 90 tablet, Rfl: 1 .  prednisoLONE acetate (PRED FORTE) 1 % ophthalmic suspension, Place 1 drop into the  right eye daily. , Disp: , Rfl:  .  promethazine (PHENERGAN) 12.5 MG tablet, Take 1 tablet (12.5 mg total) by mouth every 8 (eight) hours as needed for nausea or vomiting., Disp: 20 tablet, Rfl: 0 .  rizatriptan (MAXALT) 10 MG tablet, Take 1 tablet (10 mg total) by mouth 2 (two) times daily as needed., Disp: 10 tablet, Rfl: 2 .  Travoprost, BAK Free, (TRAVATAN) 0.004 % SOLN ophthalmic solution, Place 1 drop into the right eye at bedtime., Disp: , Rfl:  .  triamcinolone cream (KENALOG) 0.1 %, Apply 1 application topically 2 (two) times daily as needed., Disp: 30 g, Rfl: 0 .  valACYclovir (VALTREX) 500 MG tablet, Take 1 tablet by mouth 2 (two) times daily., Disp: , Rfl: 0 .  estradiol (ESTRACE) 1 MG tablet, TAKE 1 AND 1/2 TABLETS BY MOUTH DAILY (Patient not taking: Reported on 09/06/2019), Disp: 45 tablet, Rfl: 2  Allergies  Allergen Reactions  . Bactrim [Sulfamethoxazole-Trimethoprim] Nausea Only  . Penicillins Nausea Only    I personally reviewed active problem list, medication list, allergies, family history, social history with the patient/caregiver today.   ROS  Constitutional: Negative for fever or weight  change.  Respiratory: Negative for cough and shortness of breath.   Cardiovascular: Negative for chest pain or palpitations.  Gastrointestinal: Negative for abdominal pain, no bowel changes.  Musculoskeletal: Negative for gait problem or joint swelling.  Skin: Negative for rash.  Neurological: Negative for dizziness , positive for  headache.  No other specific complaints in a complete review of systems (except as listed in HPI above).  Objective  Vitals:   09/06/19 0842 09/06/19 0844  BP:  118/70  Pulse: 60   Resp: 14   Temp: (!) 97.1 F (36.2 C)   SpO2: 99%   Weight: 123 lb 9.6 oz (56.1 kg)   Height: 5\' 5"  (1.651 m)     Body mass index is 20.57 kg/m.  Physical Exam  Constitutional: Patient appears well-developed and well-nourished. No distress.  HEENT: head atraumatic, normocephalic, pupils equal and reactive to light, ears some wax on ear canal , neck supple, throat within normal limits, boggy turbinates , no drainage  Cardiovascular: Normal rate, regular rhythm and normal heart sounds.  No murmur heard. No BLE edema. Pulmonary/Chest: Effort normal and breath sounds normal. No respiratory distress. Abdominal: Soft.  There is no tenderness. Psychiatric: Patient has a normal mood and affect. behavior is normal. Judgment and thought content normal.  Recent Results (from the past 2160 hour(s))  Iron and TIBC     Status: Abnormal   Collection Time: 06/11/19  9:45 AM  Result Value Ref Range   Iron 96 28 - 170 ug/dL   TIBC 290 250 - 450 ug/dL   Saturation Ratios 33 (H) 10.4 - 31.8 %   UIBC 194 ug/dL    Comment: Performed at [facility], [address] [place], [place] [address]  Ferritin     Status: None   Collection Time: 06/11/19  9:45 AM  Result Value Ref Range   Ferritin 220 11 - 307 ng/mL    Comment: Performed at [facility], [address] [place], [place] [address]  CBC with Differential     Status: Abnormal   Collection Time: 06/11/19  9:45  AM  Result Value Ref Range   WBC 5.1 4.0 - 10.5 K/uL   RBC 3.76 (L) 3.87 - 5.11 MIL/uL   Hemoglobin 11.5 (L) 12.0 - 15.0 g/dL   HCT 35.7 (L)  36.0 - 46.0 %   MCV 94.9 80.0 - 100.0 fL   MCH 30.6 26.0 - 34.0 pg   MCHC 32.2 30.0 - 36.0 g/dL   RDW 11.9 11.5 - 15.5 %   Platelets 171 150 - 400 K/uL   nRBC 0.0 0.0 - 0.2 %   Neutrophils Relative % 49 %   Neutro Abs 2.5 1.7 - 7.7 K/uL   Lymphocytes Relative 41 %   Lymphs Abs 2.1 0.7 - 4.0 K/uL   Monocytes Relative 7 %   Monocytes Absolute 0.4 0.1 - 1.0 K/uL   Eosinophils Relative 2 %   Eosinophils Absolute 0.1 0.0 - 0.5 K/uL   Basophils Relative 1 %   Basophils Absolute 0.1 0.0 - 0.1 K/uL   Immature Granulocytes 0 %   Abs Immature Granulocytes 0.00 0.00 - 0.07 K/uL    Comment: Performed at [facility], [address] [place], [place] [address]  Vitamin B12     Status: Abnormal   Collection Time: 06/11/19  9:46 AM  Result Value Ref Range   Vitamin B-12 135 (L) 180 - 914 pg/mL    Comment: (NOTE) This assay is not validated for testing neonatal or myeloproliferative syndrome specimens for Vitamin B12 levels. Performed at [facility], [place] [address] [place], [place] [address]      PHQ2/9: Depression screen [facility] 2/9 09/06/2019 07/12/2019 06/25/2019 01/11/2019 12/20/2018  Decreased Interest 1 2 1 1 2   Down, Depressed, Hopeless 1 2 1 1 1   PHQ - 2 Score 2 4 2 2 3   Altered sleeping 2 0 3 3 2   Tired, decreased energy 2 1 0 0 2  Change in appetite 1 1 0 1 1  Feeling bad or failure about yourself  0 0 0 0 0  Trouble concentrating 0 0 0 0 0  Moving slowly or fidgety/restless 0 0 0 0 0  Suicidal thoughts 0 0 0 0 0  PHQ-9 Score 7 6 5 6 8   Difficult doing work/chores Not difficult at all Not difficult at all Not difficult at all Somewhat difficult -  Some recent data might be hidden    phq 9 is positive   Fall Risk: Fall Risk  09/06/2019 06/25/2019 12/20/2018 12/20/2018 10/17/2018  Falls in the past year? 0 0 0 0 0  Number falls  in past yr: 0 0 0 0 -  Injury with Fall? 0 0 0 0 -  Risk for fall due to : - - - - -     Functional Status Survey: Is the patient deaf or have difficulty hearing?: No Does the patient have difficulty seeing, even when wearing glasses/contacts?: No Does the patient have difficulty concentrating, remembering, or making decisions?: No Does the patient have difficulty walking or climbing stairs?: No Does the patient have difficulty dressing or bathing?: No Does the patient have difficulty doing errands alone such as visiting a doctor's office or shopping?: No    Assessment & Plan   1. Migraine without aura and without status migrainosus, not intractable  - rizatriptan (MAXALT) 10 MG tablet; Take 1 tablet (10 mg total) by mouth 2 (two) times daily as needed.  Dispense: 10 tablet; Refill: 0 - predniSONE (DELTASONE) 10 MG tablet; Take 1-2 tablets (10-20 mg total) by mouth 2 (two) times daily with a meal.  Dispense: 4 tablet; Refill: 0  2. B12 deficiency  - B12 and Folate Panel B12 before she leaves today   3. History of iron deficiency anemia  -  CBC with Differential/Platelet  4. Need for immunization against influenza  - Flu Vaccine QUAD 36+ mos IM  5. Other insomnia  - traZODone (DESYREL) 50 MG tablet; Take 0.5-2 tablets (25-100 mg total) by mouth at bedtime as needed for sleep.  Dispense: 60 tablet; Refill: 0

## 2019-09-07 LAB — CBC WITH DIFFERENTIAL/PLATELET
Absolute Monocytes: 371 cells/uL (ref 200–950)
Basophils Absolute: 91 cells/uL (ref 0–200)
Basophils Relative: 1.6 %
Eosinophils Absolute: 103 cells/uL (ref 15–500)
Eosinophils Relative: 1.8 %
HCT: 35.5 % (ref 35.0–45.0)
Hemoglobin: 11.5 g/dL — ABNORMAL LOW (ref 11.7–15.5)
Lymphs Abs: 2120 cells/uL (ref 850–3900)
MCH: 30.2 pg (ref 27.0–33.0)
MCHC: 32.4 g/dL (ref 32.0–36.0)
MCV: 93.2 fL (ref 80.0–100.0)
MPV: 11.8 fL (ref 7.5–12.5)
Monocytes Relative: 6.5 %
Neutro Abs: 3015 cells/uL (ref 1500–7800)
Neutrophils Relative %: 52.9 %
Platelets: 210 10*3/uL (ref 140–400)
RBC: 3.81 10*6/uL (ref 3.80–5.10)
RDW: 11.5 % (ref 11.0–15.0)
Total Lymphocyte: 37.2 %
WBC: 5.7 10*3/uL (ref 3.8–10.8)

## 2019-09-07 LAB — B12 AND FOLATE PANEL
Folate: 18 ng/mL
Vitamin B-12: 315 pg/mL (ref 200–1100)

## 2019-09-13 ENCOUNTER — Other Ambulatory Visit: Payer: BC Managed Care – PPO

## 2019-09-19 ENCOUNTER — Telehealth: Payer: Self-pay

## 2019-09-24 DIAGNOSIS — U071 COVID-19: Secondary | ICD-10-CM | POA: Diagnosis not present

## 2019-09-25 DIAGNOSIS — B342 Coronavirus infection, unspecified: Secondary | ICD-10-CM | POA: Insufficient documentation

## 2019-09-26 ENCOUNTER — Telehealth: Payer: Self-pay | Admitting: Family Medicine

## 2019-09-26 NOTE — Telephone Encounter (Signed)
Pt called in to make provider aware that she tested positive for covid. Pt says that she was told to update her PCP. Pt says that she feels fine she just have a Headache, not able to sleep at night and some chest congestion. Pt says that she has asthma, and would like to know if it is okay to use her inhaler more?    CB: [phone]

## 2019-09-27 ENCOUNTER — Other Ambulatory Visit: Payer: Self-pay

## 2019-09-27 ENCOUNTER — Encounter: Payer: Self-pay | Admitting: Family Medicine

## 2019-09-27 ENCOUNTER — Ambulatory Visit (INDEPENDENT_AMBULATORY_CARE_PROVIDER_SITE_OTHER): Payer: BC Managed Care – PPO | Admitting: Family Medicine

## 2019-09-27 VITALS — HR 76 | Temp 97.9°F | Ht 65.0 in | Wt 119.0 lb

## 2019-09-27 DIAGNOSIS — U071 COVID-19: Secondary | ICD-10-CM

## 2019-09-27 MED ORDER — BENZONATATE 100 MG PO CAPS: 100.0000 mg | ORAL_CAPSULE | Freq: Two times a day (BID) | ORAL | 0 refills | Status: DC | PRN

## 2019-09-27 MED ORDER — METHYLPREDNISOLONE 4 MG PO TBPK: ORAL_TABLET | ORAL | 0 refills | Status: DC

## 2019-09-27 NOTE — Progress Notes (Signed)
Name: Jessica Carroll   MRN: [id number]    DOB: 1971/11/25   Date:09/27/2019       Progress Note  Subjective  Chief Complaint  Chief Complaint  Patient presents with  . COVID-19    Boyfriend did rapid test last Thursday and was positive- he is now in the hospital in bad shape  . Cough    Terrible cough, trouble going to sleep-tossing and turning. Loss her taste and sense of smell    I connected with  [name]  on 09/27/19 at  4:00 PM EDT by a video enabled telemedicine application and verified that I am speaking with the correct person using two identifiers.  I discussed the limitations of evaluation and management by telemedicine and the availability of in person appointments. The patient expressed understanding and agreed to proceed. Staff also discussed with the patient that there may be a patient responsible charge related to this service. Patient Location: at home Provider Location: [place]    HPI  COVID-19: her boyfriend tested positive 8 days ago, 6 days ago she developed a cough and headache, on Monday lost a taste of smell. No fever or chills. She states since yesterday she has noticed worsening of cough and some chest tightness. She has been able to go outside and walk. Appetite is not as good but able to eat. No nausea, vomiting or diarrhea. Her boyfriend was hospitalized earlier this week for COVID-19 pneumonia. She has asthma, she has been using her Breo daily and had to use Ventolin last night to help her breath   Patient Active Problem List   Diagnosis Date Noted  . Moderate episode of recurrent major depressive disorder ([place]) 12/20/2018  . Iron deficiency anemia 12/19/2018  . Type A blood, Rh positive 08/22/2017  . S/P vaginal hysterectomy 07/31/2017  . Recurrent vaginitis 01/10/2017  . Hemorrhoids, external without complications 0000000  . Nausea in adult patient 02/24/2016  . Endometriosis determined by laparoscopy 01/25/2016  .  Chronic constipation 08/14/2015  . Monilial vaginitis 08/14/2015  . Bilateral ganglion cysts of wrists 08/14/2015  . Mild mitral regurgitation 05/19/2015  . B12 deficiency 05/18/2015  . Asthma, moderate 05/18/2015  . Anxiety 05/18/2015  . Insomnia, persistent 05/18/2015  . Eczema of hand 05/18/2015  . Viral keratitis 05/18/2015  . Migraine without aura and without status migrainosus, not intractable 05/18/2015  . Numerous moles 05/18/2015  . Allergic rhinitis 05/18/2015  . History of corneal transplant 05/18/2015  . Gastroesophageal reflux disease without esophagitis 12/16/2009    Social History   Tobacco Use  . Smoking status: Never Smoker  . Smokeless tobacco: Never Used  Substance Use Topics  . Alcohol use: Not Currently    Alcohol/week: 0.0 standard drinks    Comment: occ. wine     Current Outpatient Medications:  .  albuterol (PROVENTIL HFA) 108 (90 Base) MCG/ACT inhaler, Inhale 2 puffs into the lungs every 6 (six) hours as needed for wheezing or shortness of breath., Disp: 1 Inhaler, Rfl: 2 .  ALPRAZolam (XANAX) 0.5 MG tablet, Take 1 tablet (0.5 mg total) by mouth 2 (two) times daily as needed for anxiety., Disp: 10 tablet, Rfl: 0 .  BLACK COHOSH EXTRACT PO, Take by mouth., Disp: , Rfl:  .  dexlansoprazole (DEXILANT) 60 MG capsule, Take 1 capsule (60 mg total) by mouth daily., Disp: 30 capsule, Rfl: 5 .  fluticasone furoate-vilanterol (BREO ELLIPTA) 100-25 MCG/INH AEPB, INHALE 1 PUFF INTO THE LUNGS EVERY DAY, Disp: 60 each,  Rfl: 5 .  hydrOXYzine (ATARAX/VISTARIL) 10 MG tablet, Take 1 tablet (10 mg total) by mouth 3 (three) times daily as needed., Disp: 90 tablet, Rfl: 1 .  ibuprofen (ADVIL) 800 MG tablet, Take 1 tablet (800 mg total) by mouth every 8 (eight) hours as needed., Disp: 30 tablet, Rfl: 1 .  ketotifen (ZADITOR) 0.025 % ophthalmic solution, Place 1 drop into the right eye daily., Disp: , Rfl:  .  MULTIPLE VITAMIN PO, Take 1 tablet by mouth daily., Disp: , Rfl:   .  Plecanatide (TRULANCE) 3 MG TABS, Take 1 tablet by mouth daily., Disp: 90 tablet, Rfl: 1 .  prednisoLONE acetate (PRED FORTE) 1 % ophthalmic suspension, Place 1 drop into the right eye daily. , Disp: , Rfl:  .  promethazine (PHENERGAN) 12.5 MG tablet, Take 1 tablet (12.5 mg total) by mouth every 8 (eight) hours as needed for nausea or vomiting., Disp: 20 tablet, Rfl: 0 .  rizatriptan (MAXALT) 10 MG tablet, Take 1 tablet (10 mg total) by mouth 2 (two) times daily as needed., Disp: 10 tablet, Rfl: 0 .  traZODone (DESYREL) 50 MG tablet, Take 0.5-2 tablets (25-100 mg total) by mouth at bedtime as needed for sleep., Disp: 60 tablet, Rfl: 0 .  triamcinolone cream (KENALOG) 0.1 %, Apply 1 application topically 2 (two) times daily as needed., Disp: 30 g, Rfl: 0 .  valACYclovir (VALTREX) 500 MG tablet, Take 1 tablet by mouth 2 (two) times daily., Disp: , Rfl: 0 .  predniSONE (DELTASONE) 10 MG tablet, Take 1-2 tablets (10-20 mg total) by mouth 2 (two) times daily with a meal. (Patient not taking: Reported on 09/27/2019), Disp: 4 tablet, Rfl: 0 .  Travoprost, BAK Free, (TRAVATAN) 0.004 % SOLN ophthalmic solution, Place 1 drop into the right eye at bedtime., Disp: , Rfl:   Allergies  Allergen Reactions  . Bactrim [Sulfamethoxazole-Trimethoprim] Nausea Only  . Penicillins Nausea Only    I personally reviewed active problem list, medication list, allergies, family history, social history, health maintenance with the patient/caregiver today.  ROS  Ten systems reviewed and is negative except as mentioned in HPI   Objective  Virtual encounter, vitals not obtained.  Body mass index is 19.8 kg/m.  Nursing Note and Vital Signs reviewed.   Physical Exam  Awake, alert and oriented  Assessment & Plan  1. COVID-19 virus infection  - methylPREDNISolone (MEDROL DOSEPAK) 4 MG TBPK tablet; Take as directed 5 first day, 4, 3, 2, 1 subsequent days  Dispense: 21 each; Refill: 0 - benzonatate (TESSALON)  100 MG capsule; Take 1-2 capsules (100-200 mg total) by mouth 2 (two) times daily as needed.  Dispense: 40 capsule; Refill: 0  Avoid sedating medication, raise the head of the bed, go to [facility] if symptoms gets worse, stay hydrated  -Red flags and when to present for emergency care or RTC including fever >101.57F, chest pain, shortness of breath, new/worsening/un-resolving symptoms, reviewed with patient at time of visit. Follow up and care instructions discussed and provided in AVS. - I discussed the assessment and treatment plan with the patient. The patient was provided an opportunity to ask questions and all were answered. The patient agreed with the plan and demonstrated an understanding of the instructions.  I provided 15  minutes of non-face-to-face time during this encounter.  Loistine Chance, MD

## 2019-09-27 NOTE — Telephone Encounter (Signed)
Encounter Error. 

## 2019-09-30 ENCOUNTER — Telehealth: Payer: Self-pay | Admitting: Family Medicine

## 2019-09-30 ENCOUNTER — Ambulatory Visit: Payer: BC Managed Care – PPO | Admitting: Family Medicine

## 2019-09-30 DIAGNOSIS — B373 Candidiasis of vulva and vagina: Secondary | ICD-10-CM

## 2019-09-30 DIAGNOSIS — B3731 Acute candidiasis of vulva and vagina: Secondary | ICD-10-CM

## 2019-09-30 MED ORDER — FLUCONAZOLE 150 MG PO TABS: 150.0000 mg | ORAL_TABLET | ORAL | 0 refills | Status: DC

## 2019-09-30 NOTE — Telephone Encounter (Signed)
Patient called stating that she has developed an yeast infection and wanted some medication called in for it.  Patient was scheduled to have a virtual appointment on today with Dr. Ancil Boozer @ 1:20pm.  Dr. Ancil Boozer stated that she would send in Diflucan for patient this one time and that patient must follow up with GYN to see why she keeps getting yeast infection.  Patient verbalized understanding and today's virtual appointment has been canceled.

## 2019-10-01 ENCOUNTER — Encounter: Payer: Self-pay | Admitting: Family Medicine

## 2019-10-07 ENCOUNTER — Other Ambulatory Visit: Payer: Self-pay | Admitting: Family Medicine

## 2019-10-07 DIAGNOSIS — U071 COVID-19: Secondary | ICD-10-CM

## 2019-10-11 ENCOUNTER — Other Ambulatory Visit: Payer: Self-pay

## 2019-10-11 ENCOUNTER — Ambulatory Visit (INDEPENDENT_AMBULATORY_CARE_PROVIDER_SITE_OTHER): Payer: BC Managed Care – PPO | Admitting: Family Medicine

## 2019-10-11 ENCOUNTER — Encounter: Payer: Self-pay | Admitting: Family Medicine

## 2019-10-11 ENCOUNTER — Telehealth: Payer: Self-pay | Admitting: Family Medicine

## 2019-10-11 VITALS — Wt 119.0 lb

## 2019-10-11 DIAGNOSIS — Z8616 Personal history of COVID-19: Secondary | ICD-10-CM

## 2019-10-11 DIAGNOSIS — Z8619 Personal history of other infectious and parasitic diseases: Secondary | ICD-10-CM

## 2019-10-11 DIAGNOSIS — M62838 Other muscle spasm: Secondary | ICD-10-CM

## 2019-10-11 DIAGNOSIS — U071 COVID-19: Secondary | ICD-10-CM

## 2019-10-11 MED ORDER — HYDROCOD POLST-CPM POLST ER 10-8 MG/5ML PO SUER: 5.0000 mL | Freq: Two times a day (BID) | ORAL | 0 refills | Status: DC | PRN

## 2019-10-11 MED ORDER — BACLOFEN 10 MG PO TABS: 10.0000 mg | ORAL_TABLET | Freq: Three times a day (TID) | ORAL | 0 refills | Status: DC

## 2019-10-11 NOTE — Progress Notes (Addendum)
Name: Jessica Carroll   MRN: [id number]    DOB: 06-02-1971   Date:10/11/2019       Progress Note  Subjective  Chief Complaint  Chief Complaint  Patient presents with  . Cough    She had Covid-19. Cough is mostly at night and in the afternoon. Chest is hurting from cough. She has been taking tessalon pearls and prednisone, and neither have helped.  . Nasal Congestion  . Headache    She has an intermittent aching headache that radiates down her neck. This started after Covid-19.    I connected with  Jessica Carroll  on 10/11/19 at  9:00 AM EDT by a video enabled telemedicine application and verified that I am speaking with the correct person using two identifiers.  I discussed the limitations of evaluation and management by telemedicine and the availability of in person appointments. The patient expressed understanding and agreed to proceed. Staff also discussed with the patient that there may be a patient responsible charge related to this service. Patient Location: at home  Provider Location: [facility]    HPI  History of COVID-19 infection and Asthma: she was diagnosed a couple of weeks ago, she took medrol dose pack, she was able to go back to work this past Monday, however she continues to have a dry cough even though she has been taking tessalon perles and using her Breo as recommended. She states cough is dry, no wheezing or SOB. She still has some nasal congestion.   Nuchal spasms/pain; she states like a dull sensation on nuchal area over this past week. Not truly a headache. No nausea, vomiting or photophobia. Able to work. Explained likely muscle problem, we will try baclofen but advised not to take it with tussionex and avoid when driving because of possible sedation  Patient Active Problem List   Diagnosis Date Noted  . Coronavirus infection 09/25/2019  . Moderate episode of recurrent major depressive disorder ([place]) 12/20/2018  . Iron deficiency anemia  12/19/2018  . Type A blood, Rh positive 08/22/2017  . S/P vaginal hysterectomy 07/31/2017  . Recurrent vaginitis 01/10/2017  . Hemorrhoids, external without complications 0000000  . Nausea in adult patient 02/24/2016  . Endometriosis determined by laparoscopy 01/25/2016  . Chronic constipation 08/14/2015  . Monilial vaginitis 08/14/2015  . Bilateral ganglion cysts of wrists 08/14/2015  . Mild mitral regurgitation 05/19/2015  . B12 deficiency 05/18/2015  . Asthma, moderate 05/18/2015  . Anxiety 05/18/2015  . Insomnia, persistent 05/18/2015  . Eczema of hand 05/18/2015  . Viral keratitis 05/18/2015  . Migraine without aura and without status migrainosus, not intractable 05/18/2015  . Numerous moles 05/18/2015  . Allergic rhinitis 05/18/2015  . History of corneal transplant 05/18/2015  . Gastroesophageal reflux disease without esophagitis 12/16/2009    Past Surgical History:  Procedure Laterality Date  . ABDOMINAL HYSTERECTOMY    . COLONOSCOPY WITH PROPOFOL N/A 09/29/2017   Procedure: COLONOSCOPY WITH PROPOFOL;  Surgeon: Jonathon Bellows, MD;  Location: [facility] ENDOSCOPY;  Service: Gastroenterology;  Laterality: N/A;  . CORNEAL TRANSPLANT Right 01/07/2013  . ESOPHAGOGASTRODUODENOSCOPY (EGD) WITH PROPOFOL N/A 09/29/2017   Procedure: ESOPHAGOGASTRODUODENOSCOPY (EGD) WITH PROPOFOL;  Surgeon: Jonathon Bellows, MD;  Location: [facility] ENDOSCOPY;  Service: Gastroenterology;  Laterality: N/A;  . EYE SURGERY    . eyelid surgery Right June 19, 2015   [facility] by Dr. Norlene Duel  . LAPAROSCOPIC BILATERAL SALPINGECTOMY N/A 01/25/2016   Procedure: LAPAROSCOPIC BILATERAL SALPINGECTOMY, ADHESIOLYSIS, LAPAROSCOPIC EXCISION/FULGERATION ENDOMETRIOSIS ;  Surgeon: Hassell Done  A Defrancesco, MD;  Location: [facility] ORS;  Service: Gynecology;  Laterality: N/A;  . Laparoscopic excision and fulguration of endometriosis N/A    bladder flap and uterosacral ligament endometriosis, excised and cauterized  . UPPER GI ENDOSCOPY   07/19/2012  . VAGINAL HYSTERECTOMY N/A 07/31/2017   Procedure: HYSTERECTOMY VAGINAL;  Surgeon: [name], MD;  Location: [facility] ORS;  Service: Gynecology;  Laterality: N/A;    Family History  Adopted: Yes  Problem Relation Age of Onset  . Hyperlipidemia Mother   . Hypertension Mother   . Diabetes Father   . Heart failure Father 79       hear attack  . Cancer Neg Hx   . Breast cancer Neg Hx     Social History   Socioeconomic History  . Marital status: Single    Spouse name: Not on file  . Number of children: 0  . Years of education: 41  . Highest education level: High school graduate  Occupational History  . Occupation: Education officer, community  . Financial resource strain: Patient refused  . Food insecurity    Worry: Never true    Inability: Never true  . Transportation needs    Medical: No    Non-medical: No  Tobacco Use  . Smoking status: Never Smoker  . Smokeless tobacco: Never Used  Substance and Sexual Activity  . Alcohol use: Not Currently    Alcohol/week: 0.0 standard drinks    Comment: occ. wine  . Drug use: No  . Sexual activity: Yes    Partners: Male    Birth control/protection: None, Surgical  Lifestyle  . Physical activity    Days per week: 5 days    Minutes per session: 150+ min  . Stress: To some extent  Relationships  . Social connections    Talks on phone: More than three times a week    Gets together: Once a week    Attends religious service: More than 4 times per year    Active member of club or organization: No    Attends meetings of clubs or organizations: Never    Relationship status: Never married  . Intimate partner violence    Fear of current or ex partner: No    Emotionally abused: No    Physically abused: No    Forced sexual activity: No  Other Topics Concern  . Not on file  Social History Narrative   Engaged to get married to her boyfriend of 14 years.     Current Outpatient Medications:  .  albuterol (PROVENTIL  HFA) 108 (90 Base) MCG/ACT inhaler, Inhale 2 puffs into the lungs every 6 (six) hours as needed for wheezing or shortness of breath., Disp: 1 Inhaler, Rfl: 2 .  ALPRAZolam (XANAX) 0.5 MG tablet, Take 1 tablet (0.5 mg total) by mouth 2 (two) times daily as needed for anxiety., Disp: 10 tablet, Rfl: 0 .  benzonatate (TESSALON) 100 MG capsule, Take 1-2 capsules (100-200 mg total) by mouth 2 (two) times daily as needed., Disp: 40 capsule, Rfl: 0 .  BLACK COHOSH EXTRACT PO, Take by mouth., Disp: , Rfl:  .  dexlansoprazole (DEXILANT) 60 MG capsule, Take 1 capsule (60 mg total) by mouth daily., Disp: 30 capsule, Rfl: 5 .  fluticasone furoate-vilanterol (BREO ELLIPTA) 100-25 MCG/INH AEPB, INHALE 1 PUFF INTO THE LUNGS EVERY DAY, Disp: 60 each, Rfl: 5 .  hydrOXYzine (ATARAX/VISTARIL) 10 MG tablet, Take 1 tablet (10 mg total) by mouth 3 (three) times daily as needed.,  Disp: 90 tablet, Rfl: 1 .  ibuprofen (ADVIL) 800 MG tablet, Take 1 tablet (800 mg total) by mouth every 8 (eight) hours as needed., Disp: 30 tablet, Rfl: 1 .  ketotifen (ZADITOR) 0.025 % ophthalmic solution, Place 1 drop into the right eye daily., Disp: , Rfl:  .  methylPREDNISolone (MEDROL DOSEPAK) 4 MG TBPK tablet, Take as directed 5 first day, 4, 3, 2, 1 subsequent days, Disp: 21 each, Rfl: 0 .  MULTIPLE VITAMIN PO, Take 1 tablet by mouth daily., Disp: , Rfl:  .  Plecanatide (TRULANCE) 3 MG TABS, Take 1 tablet by mouth daily., Disp: 90 tablet, Rfl: 1 .  prednisoLONE acetate (PRED FORTE) 1 % ophthalmic suspension, Place 1 drop into the right eye daily. , Disp: , Rfl:  .  promethazine (PHENERGAN) 12.5 MG tablet, Take 1 tablet (12.5 mg total) by mouth every 8 (eight) hours as needed for nausea or vomiting., Disp: 20 tablet, Rfl: 0 .  rizatriptan (MAXALT) 10 MG tablet, Take 1 tablet (10 mg total) by mouth 2 (two) times daily as needed., Disp: 10 tablet, Rfl: 0 .  Travoprost, BAK Free, (TRAVATAN) 0.004 % SOLN ophthalmic solution, Place 1 drop into  the right eye at bedtime., Disp: , Rfl:  .  traZODone (DESYREL) 50 MG tablet, Take 0.5-2 tablets (25-100 mg total) by mouth at bedtime as needed for sleep., Disp: 60 tablet, Rfl: 0 .  triamcinolone cream (KENALOG) 0.1 %, Apply 1 application topically 2 (two) times daily as needed., Disp: 30 g, Rfl: 0 .  valACYclovir (VALTREX) 500 MG tablet, Take 1 tablet by mouth 2 (two) times daily., Disp: , Rfl: 0 .  fluconazole (DIFLUCAN) 150 MG tablet, Take 1 tablet (150 mg total) by mouth every other day. (Patient not taking: Reported on 10/11/2019), Disp: 3 tablet, Rfl: 0  Allergies  Allergen Reactions  . Bactrim [Sulfamethoxazole-Trimethoprim] Nausea Only  . Penicillins Nausea Only    I personally reviewed active problem list, medication list, allergies, family history, social history, health maintenance with the patient/caregiver today.   ROS  Ten systems reviewed and is negative except as mentioned in HPI   Objective  Virtual encounter, vitals not obtained.  Body mass index is 19.8 kg/m.  Physical Exam  Awake, alert and oriented   PHQ2/9: Depression screen [facility] 2/9 10/11/2019 09/27/2019 09/06/2019 07/12/2019 06/25/2019  Decreased Interest 0 0 1 2 1   Down, Depressed, Hopeless 0 0 1 2 1   PHQ - 2 Score 0 0 2 4 2   Altered sleeping 0 0 2 0 3  Tired, decreased energy 0 0 2 1 0  Change in appetite 0 0 1 1 0  Feeling bad or failure about yourself  0 0 0 0 0  Trouble concentrating 0 0 0 0 0  Moving slowly or fidgety/restless 0 0 0 0 0  Suicidal thoughts 0 0 0 0 0  PHQ-9 Score 0 0 7 6 5   Difficult doing work/chores - Not difficult at all Not difficult at all Not difficult at all Not difficult at all  Some recent data might be hidden   PHQ-2/9 Result is negative.    Fall Risk: Fall Risk  10/11/2019 09/27/2019 09/06/2019 06/25/2019 12/20/2018  Falls in the past year? 0 0 0 0 0  Number falls in past yr: 0 0 0 0 0  Injury with Fall? 0 0 0 0 0  Risk for fall due to : - - - - -      Assessment & Plan  1.  History of 2019 novel coronavirus disease (COVID-19)  - chlorpheniramine-HYDROcodone (TUSSIONEX PENNKINETIC ER) 10-8 MG/5ML SUER; Take 5 mLs by mouth every 12 (twelve) hours as needed.  Dispense: 473 mL; Refill: 0  [facility] did not have tussionex, so we cancelled rx that was sent there and sent a new one to [facility]  2. Neck muscle spasm  - baclofen (LIORESAL) 10 MG tablet; Take 1 tablet (10 mg total) by mouth 3 (three) times daily.  Dispense: 30 each; Refill: 0 I discussed the assessment and treatment plan with the patient. The patient was provided an opportunity to ask questions and all were answered. The patient agreed with the plan and demonstrated an understanding of the instructions.  The patient was advised to call back or seek an in-person evaluation if the symptoms worsen or if the condition fails to improve as anticipated.  I provided 15 minutes of non-face-to-face time during this encounter.

## 2019-10-11 NOTE — Telephone Encounter (Signed)
Pt states her pharmacy will not have this medication until next week and can't transfer because it is a narcotic cough syrup chlorpheniramine-HYDROcodone (TUSSIONEX PENNKINETIC ER) 10-8 MG/5ML SUER  Pt would like this sent to  [place], [place] - [address] [phone] (Phone) [phone] (Fax)   So she can get today. Please call pt at [phone]

## 2019-10-11 NOTE — Addendum Note (Signed)
Addended by: Steele Sizer F on: 10/11/2019 01:12 PM   Modules accepted: Orders

## 2019-10-14 ENCOUNTER — Telehealth: Payer: Self-pay | Admitting: Family Medicine

## 2019-10-14 MED ORDER — PROMETHAZINE VC/CODEINE 6.25-5-10 MG/5ML PO SYRP: 5.0000 mL | ORAL_SOLUTION | Freq: Every day | ORAL | 0 refills | Status: DC

## 2019-10-14 NOTE — Telephone Encounter (Signed)
Called [facility] and cancelled Tussionex due to cost. Spoke with [facility] and they have to order PROMETHAZINE VC/CODEINE and it will be in tomorrow. Patient notified and [facility] will call her when the cough medication is ready for pick up.

## 2019-10-14 NOTE — Telephone Encounter (Signed)
Pt calling at the request of the hospital.  States her boyfriend has been admitted with possible spinal meningitis.  Pt was told this is contagious and that she needs to see if her PCP would give antibiotics.

## 2019-10-14 NOTE — Telephone Encounter (Signed)
Copied from [place] [id number]. Topic: Quick Communication - Rx Refill/Question >> Oct 11, 2019  3:23 PM Erick Blinks wrote: chlorpheniramine-HYDROcodone [facility] ER) 10-8 MG/5ML SUER - pt states Rx is too expensive  $222.00  Requesting call back from [facility], [phone] >> Oct 14, 2019  8:43 AM Rayann Heman wrote: Pt calling to check status and would like to know if something else can be called in. Please advise

## 2019-10-15 NOTE — Telephone Encounter (Signed)
Spoke with patient and she states her boyfriend is going to have his rashes [facility]. The doctor is not sure if it is Meningitis and will call us once they get a diagnosis.

## 2019-10-16 ENCOUNTER — Telehealth: Payer: Self-pay | Admitting: Family Medicine

## 2019-10-16 NOTE — Telephone Encounter (Signed)
Pt states [facility] has not gotten the   chlorpheniramine-HYDROcodone (TUSSIONEX PENNKINETIC ER) 10-8 MG/5ML SUER   Ad it is on backorder.  And they do not know when they will get in.   Pt wants you to re send to:   [place], [place] - [address] [phone] (Phone) [phone] (Fax)   Pt states she has been trying to get since last week.

## 2019-10-17 ENCOUNTER — Other Ambulatory Visit: Payer: Self-pay | Admitting: Family Medicine

## 2019-10-17 ENCOUNTER — Telehealth: Payer: Self-pay

## 2019-10-17 DIAGNOSIS — U071 COVID-19: Secondary | ICD-10-CM

## 2019-10-17 MED ORDER — PROMETHAZINE VC/CODEINE 6.25-5-10 MG/5ML PO SYRP: 5.0000 mL | ORAL_SOLUTION | Freq: Every day | ORAL | 0 refills | Status: DC

## 2019-10-17 NOTE — Telephone Encounter (Signed)
Patient called and states she is transferring all her prescription from [facility] to [facility]. Called [facility] and cancel Prometh-Codeine prescription since they informed us they could order it then turned around and told the back it was on backorder and could not get it until next week. Patient would like this prescription to be send to [facility].

## 2019-10-22 ENCOUNTER — Encounter: Payer: BLUE CROSS/BLUE SHIELD | Admitting: Family Medicine

## 2019-10-29 ENCOUNTER — Telehealth: Payer: Self-pay | Admitting: Gastroenterology

## 2019-10-29 NOTE — Telephone Encounter (Signed)
Pt  Called to make an apt with Dr. Vicente Males we scheduled her for 12/25/18 on waitlist she states she would like a call to discuss Bloating and nausea after eating please call pt

## 2019-10-30 ENCOUNTER — Encounter: Payer: BLUE CROSS/BLUE SHIELD | Admitting: Obstetrics and Gynecology

## 2019-11-03 NOTE — Telephone Encounter (Signed)
Yes sooner please

## 2019-11-05 NOTE — Telephone Encounter (Signed)
Called pt to offer her a sooner appointment to see Dr. Vicente Males.  Unable to contact, LVM to return call

## 2019-11-11 ENCOUNTER — Ambulatory Visit: Payer: BC Managed Care – PPO | Admitting: Gastroenterology

## 2019-11-13 ENCOUNTER — Ambulatory Visit (INDEPENDENT_AMBULATORY_CARE_PROVIDER_SITE_OTHER): Payer: BC Managed Care – PPO | Admitting: Family Medicine

## 2019-11-13 ENCOUNTER — Encounter: Payer: Self-pay | Admitting: Family Medicine

## 2019-11-13 ENCOUNTER — Other Ambulatory Visit: Payer: Self-pay

## 2019-11-13 DIAGNOSIS — K219 Gastro-esophageal reflux disease without esophagitis: Secondary | ICD-10-CM | POA: Diagnosis not present

## 2019-11-13 DIAGNOSIS — R1013 Epigastric pain: Secondary | ICD-10-CM | POA: Diagnosis not present

## 2019-11-13 MED ORDER — PANTOPRAZOLE SODIUM 40 MG PO TBEC: 40.0000 mg | DELAYED_RELEASE_TABLET | Freq: Two times a day (BID) | ORAL | 0 refills | Status: DC

## 2019-11-13 NOTE — Progress Notes (Signed)
Name: Jessica Carroll   MRN: [id number]    DOB: 05-Feb-1971   Date:11/13/2019       Progress Note  Subjective  Chief Complaint  Chief Complaint  Patient presents with  . Medication Problem    Dexilant is causing abdominal pain. She drank a teaspoon of apple cider vinegar last night and she has not had any problems. She felt nausea and bloating after eating as well as pain.  . Gastroesophageal Reflux    I connected with  [name]  on 11/13/19 at  1:20 PM EST by a video enabled telemedicine application and verified that I am speaking with the correct person using two identifiers.  I discussed the limitations of evaluation and management by telemedicine and the availability of in person appointments. The patient expressed understanding and agreed to proceed. Staff also discussed with the patient that there may be a patient responsible charge related to this service. Patient Location: at work  Provider Location: [employer]   HPI  GERD/Epigastric: she states  she has a long history of GERD, and has been taking  Dexilant for the past couple of years but states that over the past couple of weeks her symptoms have been getting worse again. She states at one point she almost went to [facility] with epigastric pain. She has noticed that if she takes an extra PPI at night and eats bland food pain goes away. She denies change in bowel movements, vomiting , weight loss of blood in stools. Pain is sharp , intense at times and epigastric, sometimes radiates to left or right upper quadrant and sometimes associated with nausea. Triggered by eating spicy food and also drink caffeine. She has tried apple cider vinegar .    Patient Active Problem List   Diagnosis Date Noted  . Coronavirus infection 09/25/2019  . Moderate episode of recurrent major depressive disorder ([place]) 12/20/2018  . Iron deficiency anemia 12/19/2018  . Type A blood, Rh positive 08/22/2017  . S/P vaginal hysterectomy  07/31/2017  . Recurrent vaginitis 01/10/2017  . Hemorrhoids, external without complications 0000000  . Nausea in adult patient 02/24/2016  . Endometriosis determined by laparoscopy 01/25/2016  . Chronic constipation 08/14/2015  . Monilial vaginitis 08/14/2015  . Bilateral ganglion cysts of wrists 08/14/2015  . Mild mitral regurgitation 05/19/2015  . B12 deficiency 05/18/2015  . Asthma, moderate 05/18/2015  . Anxiety 05/18/2015  . Insomnia, persistent 05/18/2015  . Eczema of hand 05/18/2015  . Viral keratitis 05/18/2015  . Migraine without aura and without status migrainosus, not intractable 05/18/2015  . Numerous moles 05/18/2015  . Allergic rhinitis 05/18/2015  . History of corneal transplant 05/18/2015  . Gastroesophageal reflux disease without esophagitis 12/16/2009    Past Surgical History:  Procedure Laterality Date  . ABDOMINAL HYSTERECTOMY    . COLONOSCOPY WITH PROPOFOL N/A 09/29/2017   Procedure: COLONOSCOPY WITH PROPOFOL;  Surgeon: Jonathon Bellows, MD;  Location: [facility] ENDOSCOPY;  Service: Gastroenterology;  Laterality: N/A;  . CORNEAL TRANSPLANT Right 01/07/2013  . ESOPHAGOGASTRODUODENOSCOPY (EGD) WITH PROPOFOL N/A 09/29/2017   Procedure: ESOPHAGOGASTRODUODENOSCOPY (EGD) WITH PROPOFOL;  Surgeon: Jonathon Bellows, MD;  Location: [facility] ENDOSCOPY;  Service: Gastroenterology;  Laterality: N/A;  . EYE SURGERY    . eyelid surgery Right June 19, 2015   [facility] by Dr. Norlene Duel  . LAPAROSCOPIC BILATERAL SALPINGECTOMY N/A 01/25/2016   Procedure: LAPAROSCOPIC BILATERAL SALPINGECTOMY, ADHESIOLYSIS, LAPAROSCOPIC EXCISION/FULGERATION ENDOMETRIOSIS ;  Surgeon: [name], MD;  Location: [facility] ORS;  Service: Gynecology;  Laterality:  N/A;  . Laparoscopic excision and fulguration of endometriosis N/A    bladder flap and uterosacral ligament endometriosis, excised and cauterized  . UPPER GI ENDOSCOPY  07/19/2012  . VAGINAL HYSTERECTOMY N/A 07/31/2017   Procedure: HYSTERECTOMY  VAGINAL;  Surgeon: [name], MD;  Location: [facility] ORS;  Service: Gynecology;  Laterality: N/A;    Family History  Adopted: Yes  Problem Relation Age of Onset  . Hyperlipidemia Mother   . Hypertension Mother   . Diabetes Father   . Heart failure Father 44       hear attack  . Cancer Neg Hx   . Breast cancer Neg Hx     Social History   Socioeconomic History  . Marital status: Single    Spouse name: Not on file  . Number of children: 0  . Years of education: 72  . Highest education level: High school graduate  Occupational History  . Occupation: Education officer, community  . Financial resource strain: Patient refused  . Food insecurity    Worry: Never true    Inability: Never true  . Transportation needs    Medical: No    Non-medical: No  Tobacco Use  . Smoking status: Never Smoker  . Smokeless tobacco: Never Used  Substance and Sexual Activity  . Alcohol use: Not Currently    Alcohol/week: 0.0 standard drinks    Comment: occ. wine  . Drug use: No  . Sexual activity: Yes    Partners: Male    Birth control/protection: None, Surgical  Lifestyle  . Physical activity    Days per week: 5 days    Minutes per session: 150+ min  . Stress: To some extent  Relationships  . Social connections    Talks on phone: More than three times a week    Gets together: Once a week    Attends religious service: More than 4 times per year    Active member of club or organization: No    Attends meetings of clubs or organizations: Never    Relationship status: Never married  . Intimate partner violence    Fear of current or ex partner: No    Emotionally abused: No    Physically abused: No    Forced sexual activity: No  Other Topics Concern  . Not on file  Social History Narrative   Engaged to get married to her boyfriend of 14 years.     Current Outpatient Medications:  .  albuterol (PROVENTIL HFA) 108 (90 Base) MCG/ACT inhaler, Inhale 2 puffs into the lungs every 6  (six) hours as needed for wheezing or shortness of breath., Disp: 1 Inhaler, Rfl: 2 .  ALPRAZolam (XANAX) 0.5 MG tablet, Take 1 tablet (0.5 mg total) by mouth 2 (two) times daily as needed for anxiety., Disp: 10 tablet, Rfl: 0 .  baclofen (LIORESAL) 10 MG tablet, Take 1 tablet (10 mg total) by mouth 3 (three) times daily., Disp: 30 each, Rfl: 0 .  BLACK COHOSH EXTRACT PO, Take by mouth., Disp: , Rfl:  .  chlorpheniramine-HYDROcodone (TUSSIONEX PENNKINETIC ER) 10-8 MG/5ML SUER, Take 5 mLs by mouth every 12 (twelve) hours as needed., Disp: 473 mL, Rfl: 0 .  dexlansoprazole (DEXILANT) 60 MG capsule, Take 1 capsule (60 mg total) by mouth daily., Disp: 30 capsule, Rfl: 5 .  fluticasone furoate-vilanterol (BREO ELLIPTA) 100-25 MCG/INH AEPB, INHALE 1 PUFF INTO THE LUNGS EVERY DAY, Disp: 60 each, Rfl: 5 .  hydrOXYzine (ATARAX/VISTARIL) 10 MG tablet, Take 1 tablet (  10 mg total) by mouth 3 (three) times daily as needed., Disp: 90 tablet, Rfl: 1 .  ibuprofen (ADVIL) 800 MG tablet, Take 1 tablet (800 mg total) by mouth every 8 (eight) hours as needed., Disp: 30 tablet, Rfl: 1 .  ketotifen (ZADITOR) 0.025 % ophthalmic solution, Place 1 drop into the right eye daily., Disp: , Rfl:  .  MULTIPLE VITAMIN PO, Take 1 tablet by mouth daily., Disp: , Rfl:  .  prednisoLONE acetate (PRED FORTE) 1 % ophthalmic suspension, Place 1 drop into the right eye daily. , Disp: , Rfl:  .  promethazine (PHENERGAN) 12.5 MG tablet, Take 1 tablet (12.5 mg total) by mouth every 8 (eight) hours as needed for nausea or vomiting., Disp: 20 tablet, Rfl: 0 .  rizatriptan (MAXALT) 10 MG tablet, Take 1 tablet (10 mg total) by mouth 2 (two) times daily as needed., Disp: 10 tablet, Rfl: 0 .  Travoprost, BAK Free, (TRAVATAN) 0.004 % SOLN ophthalmic solution, Place 1 drop into the right eye at bedtime., Disp: , Rfl:  .  traZODone (DESYREL) 50 MG tablet, Take 0.5-2 tablets (25-100 mg total) by mouth at bedtime as needed for sleep., Disp: 60 tablet,  Rfl: 0 .  triamcinolone cream (KENALOG) 0.1 %, Apply 1 application topically 2 (two) times daily as needed., Disp: 30 g, Rfl: 0 .  valACYclovir (VALTREX) 500 MG tablet, Take 1 tablet by mouth 2 (two) times daily., Disp: , Rfl: 0 .  Plecanatide (TRULANCE) 3 MG TABS, Take 1 tablet by mouth daily., Disp: 90 tablet, Rfl: 1 .  Promethazine-Phenyleph-Codeine (PROMETHAZINE VC/CODEINE) 6.25-5-10 MG/5ML SYRP, Take 5 mLs by mouth at bedtime. (Patient not taking: Reported on 11/13/2019), Disp: 118 mL, Rfl: 0  Allergies  Allergen Reactions  . Bactrim [Sulfamethoxazole-Trimethoprim] Nausea Only  . Penicillins Nausea Only    I personally reviewed active problem list, medication list, allergies, family history, social history, health maintenance with the patient/caregiver today.   ROS  Ten systems reviewed and is negative except as mentioned in HPI   Objective  Virtual encounter, vitals not obtained.   There is no height or weight on file to calculate BMI.  Physical Exam  Awake, alert and oriented  PHQ2/9: Depression screen [facility] 2/9 11/13/2019 10/11/2019 09/27/2019 09/06/2019 07/12/2019  Decreased Interest 0 0 0 1 2  Down, Depressed, Hopeless 0 0 0 1 2  PHQ - 2 Score 0 0 0 2 4  Altered sleeping 0 0 0 2 0  Tired, decreased energy 0 0 0 2 1  Change in appetite 0 0 0 1 1  Feeling bad or failure about yourself  0 0 0 0 0  Trouble concentrating 0 0 0 0 0  Moving slowly or fidgety/restless 0 0 0 0 0  Suicidal thoughts 0 0 0 0 0  PHQ-9 Score 0 0 0 7 6  Difficult doing work/chores - - Not difficult at all Not difficult at all Not difficult at all  Some recent data might be hidden   PHQ-2/9 Result is negative.    Fall Risk: Fall Risk  10/11/2019 09/27/2019 09/06/2019 06/25/2019 12/20/2018  Falls in the past year? 0 0 0 0 0  Number falls in past yr: 0 0 0 0 0  Injury with Fall? 0 0 0 0 0  Risk for fall due to : - - - - -     Assessment & Plan  1. Gastroesophageal reflux disease without  esophagitis  - pantoprazole (PROTONIX) 40 MG tablet; Take 1 tablet (40  mg total) by mouth 2 (two) times daily before a meal.  Dispense: 180 tablet; Refill: 0  2. Epigastric pain  Discussed possible gastritis, we will try PPI BID, but needs to follow up with Dr. Vicente Males if no resolution of symptoms, also reviewed a GERD appropriate diet with patient  - pantoprazole (PROTONIX) 40 MG tablet; Take 1 tablet (40 mg total) by mouth 2 (two) times daily before a meal.  Dispense: 180 tablet; Refill: 0  I discussed the assessment and treatment plan with the patient. The patient was provided an opportunity to ask questions and all were answered. The patient agreed with the plan and demonstrated an understanding of the instructions.  The patient was advised to call back or seek an in-person evaluation if the symptoms worsen or if the condition fails to improve as anticipated.  I provided 25 minutes of non-face-to-face time during this encounter.

## 2019-11-19 ENCOUNTER — Other Ambulatory Visit: Payer: Self-pay | Admitting: Family Medicine

## 2019-11-20 ENCOUNTER — Other Ambulatory Visit: Payer: Self-pay

## 2019-11-20 ENCOUNTER — Encounter: Payer: Self-pay | Admitting: Family Medicine

## 2019-11-20 ENCOUNTER — Ambulatory Visit: Payer: BC Managed Care – PPO | Admitting: Family Medicine

## 2019-11-20 VITALS — BP 108/68 | HR 62 | Temp 97.8°F | Resp 14 | Ht 65.0 in | Wt 126.6 lb

## 2019-11-20 DIAGNOSIS — N63 Unspecified lump in unspecified breast: Secondary | ICD-10-CM

## 2019-11-20 DIAGNOSIS — O9229 Other disorders of breast associated with pregnancy and the puerperium: Secondary | ICD-10-CM

## 2019-11-20 NOTE — Progress Notes (Signed)
Patient ID: Jessica Carroll, female    DOB: 02-26-1971, 48 y.o.   MRN: [id number]  PCP: Steele Sizer, MD  Chief Complaint  Patient presents with  . Breast Pain    left onset 3 days due for mammogram    Subjective:   Jessica Carroll is a 48 y.o. female, presents to clinic with CC of the following:  HPI  Left breast pain onset two days ago shooting from left mid axillary line to nipple area on left, sharp and shooting intermittent, pain rated two to 8 out of ten.  She also feels like her left breast is enlarged and swollen and is very tender to the touch.  He has not noticed any skin changes redness no nipple discharge, no lymphadenopathy  Patient called this morning to the breast imaging location to see if she can get her mammogram but they asked her several questions and stated that she needed to be evaluated first before having imaging done.  She would like to go back and get any appropriate imaging done  COVID 09/20/2019 covid positive   Black Cohosh OTC, stopped hormone meds from Dr. Amalia Hailey - she is instead taking OTC supplements called "Menosense" Angelica sinensis too vitex agnus-castus aucubin, black cohosh, triterpine glycosides 27-deoxactein Gamma oryzanol herperidin bioflavanoid extract, citrus sinesis heperdin     Patient Active Problem List   Diagnosis Date Noted  . Coronavirus infection 09/25/2019  . Moderate episode of recurrent major depressive disorder ([place]) 12/20/2018  . Iron deficiency anemia 12/19/2018  . Type A blood, Rh positive 08/22/2017  . S/P vaginal hysterectomy 07/31/2017  . Recurrent vaginitis 01/10/2017  . Hemorrhoids, external without complications 0000000  . Nausea in adult patient 02/24/2016  . Endometriosis determined by laparoscopy 01/25/2016  . Chronic constipation 08/14/2015  . Monilial vaginitis 08/14/2015  . Bilateral ganglion cysts of wrists 08/14/2015  . Mild mitral regurgitation 05/19/2015  . B12 deficiency 05/18/2015  .  Asthma, moderate 05/18/2015  . Anxiety 05/18/2015  . Insomnia, persistent 05/18/2015  . Eczema of hand 05/18/2015  . Viral keratitis 05/18/2015  . Migraine without aura and without status migrainosus, not intractable 05/18/2015  . Numerous moles 05/18/2015  . Allergic rhinitis 05/18/2015  . History of corneal transplant 05/18/2015  . Gastroesophageal reflux disease without esophagitis 12/16/2009      Current Outpatient Medications:  .  albuterol (PROVENTIL HFA) 108 (90 Base) MCG/ACT inhaler, Inhale 2 puffs into the lungs every 6 (six) hours as needed for wheezing or shortness of breath., Disp: 1 Inhaler, Rfl: 2 .  ALPRAZolam (XANAX) 0.5 MG tablet, Take 1 tablet (0.5 mg total) by mouth 2 (two) times daily as needed for anxiety., Disp: 10 tablet, Rfl: 0 .  baclofen (LIORESAL) 10 MG tablet, Take 1 tablet (10 mg total) by mouth 3 (three) times daily., Disp: 30 each, Rfl: 0 .  BLACK COHOSH EXTRACT PO, Take by mouth., Disp: , Rfl:  .  fluticasone furoate-vilanterol (BREO ELLIPTA) 100-25 MCG/INH AEPB, INHALE 1 PUFF INTO THE LUNGS EVERY DAY, Disp: 60 each, Rfl: 5 .  hydrOXYzine (ATARAX/VISTARIL) 10 MG tablet, Take 1 tablet (10 mg total) by mouth 3 (three) times daily as needed., Disp: 90 tablet, Rfl: 1 .  ibuprofen (ADVIL) 800 MG tablet, Take 1 tablet (800 mg total) by mouth every 8 (eight) hours as needed., Disp: 30 tablet, Rfl: 1 .  ketotifen (ZADITOR) 0.025 % ophthalmic solution, Place 1 drop into the right eye daily., Disp: , Rfl:  .  MULTIPLE VITAMIN PO,  Take 1 tablet by mouth daily., Disp: , Rfl:  .  pantoprazole (PROTONIX) 40 MG tablet, Take 1 tablet (40 mg total) by mouth 2 (two) times daily before a meal., Disp: 180 tablet, Rfl: 0 .  Plecanatide (TRULANCE) 3 MG TABS, Take 1 tablet by mouth daily., Disp: 90 tablet, Rfl: 1 .  prednisoLONE acetate (PRED FORTE) 1 % ophthalmic suspension, Place 1 drop into the right eye daily. , Disp: , Rfl:  .  promethazine (PHENERGAN) 12.5 MG tablet, Take 1  tablet (12.5 mg total) by mouth every 8 (eight) hours as needed for nausea or vomiting., Disp: 20 tablet, Rfl: 0 .  rizatriptan (MAXALT) 10 MG tablet, Take 1 tablet (10 mg total) by mouth 2 (two) times daily as needed., Disp: 10 tablet, Rfl: 0 .  Travoprost, BAK Free, (TRAVATAN) 0.004 % SOLN ophthalmic solution, Place 1 drop into the right eye at bedtime., Disp: , Rfl:  .  traZODone (DESYREL) 50 MG tablet, Take 0.5-2 tablets (25-100 mg total) by mouth at bedtime as needed for sleep., Disp: 60 tablet, Rfl: 0 .  triamcinolone cream (KENALOG) 0.1 %, Apply 1 application topically 2 (two) times daily as needed., Disp: 30 g, Rfl: 0 .  valACYclovir (VALTREX) 500 MG tablet, Take 1 tablet by mouth 2 (two) times daily., Disp: , Rfl: 0   Allergies  Allergen Reactions  . Bactrim [Sulfamethoxazole-Trimethoprim] Nausea Only  . Penicillins Nausea Only     Family History  Adopted: Yes  Problem Relation Age of Onset  . Hyperlipidemia Mother   . Hypertension Mother   . Diabetes Father   . Heart failure Father 81       hear attack  . Cancer Neg Hx   . Breast cancer Neg Hx      Social History   Socioeconomic History  . Marital status: Single    Spouse name: Not on file  . Number of children: 0  . Years of education: 48  . Highest education level: High school graduate  Occupational History  . Occupation: Education officer, community  . Financial resource strain: Patient refused  . Food insecurity    Worry: Never true    Inability: Never true  . Transportation needs    Medical: No    Non-medical: No  Tobacco Use  . Smoking status: Never Smoker  . Smokeless tobacco: Never Used  Substance and Sexual Activity  . Alcohol use: Not Currently    Alcohol/week: 0.0 standard drinks    Comment: occ. wine  . Drug use: No  . Sexual activity: Yes    Partners: Male    Birth control/protection: None, Surgical  Lifestyle  . Physical activity    Days per week: 5 days    Minutes per session: 150+ min  .  Stress: To some extent  Relationships  . Social connections    Talks on phone: More than three times a week    Gets together: Once a week    Attends religious service: More than 4 times per year    Active member of club or organization: No    Attends meetings of clubs or organizations: Never    Relationship status: Never married  . Intimate partner violence    Fear of current or ex partner: No    Emotionally abused: No    Physically abused: No    Forced sexual activity: No  Other Topics Concern  . Not on file  Social History Narrative   Engaged to get married to  her boyfriend of 14 years.    I personally reviewed active problem list, medication list, allergies, family history, social history, health maintenance, notes from last encounter, lab results, imaging with the patient/caregiver today.   Review of Systems  Constitutional: Negative.   HENT: Negative.   Eyes: Negative.   Respiratory: Negative.   Cardiovascular: Negative.   Gastrointestinal: Negative.   Endocrine: Negative.   Genitourinary: Negative.   Musculoskeletal: Negative.   Skin: Negative.   Allergic/Immunologic: Negative.   Neurological: Negative.   Hematological: Negative.   Psychiatric/Behavioral: Negative.   All other systems reviewed and are negative.      Objective:   Vitals:   11/20/19 0805  BP: 108/68  Pulse: 62  Resp: 14  Temp: 97.8 F (36.6 C)  SpO2: 100%  Weight: 126 lb 9.6 oz (57.4 kg)  Height: 5\' 5"  (1.651 m)    Body mass index is 21.07 kg/m.  Physical Exam Vitals signs and nursing note reviewed.  Constitutional:      General: She is not in acute distress.    Appearance: Normal appearance. She is well-developed. She is not ill-appearing, toxic-appearing or diaphoretic.  HENT:     Head: Normocephalic and atraumatic.     Nose: Nose normal.  Eyes:     General:        Right eye: No discharge.        Left eye: No discharge.     Conjunctiva/sclera: Conjunctivae normal.  Neck:      Trachea: No tracheal deviation.  Cardiovascular:     Rate and Rhythm: Normal rate and regular rhythm.  Pulmonary:     Effort: Pulmonary effort is normal. No respiratory distress.     Breath sounds: No stridor.  Chest:     Breasts:        Right: Tenderness present. No swelling, bleeding, inverted nipple, mass, nipple discharge or skin change.        Left: Swelling and tenderness present. No bleeding, inverted nipple, mass, nipple discharge or skin change.     Comments: Left breast visibly asymmetrical and larger than the right Bilateral breast tissue is tender to palpation Right breast tissue is located only roughly 6 to 8 cm in diameter central to right breast Left breast dense and tender tissue extends from almost her left sternal border to her anterior mid axillary line No erythema, nodules, masses Musculoskeletal: Normal range of motion.  Lymphadenopathy:     Upper Body:     Right upper body: No supraclavicular, axillary or pectoral adenopathy.     Left upper body: No supraclavicular, axillary or pectoral adenopathy.  Skin:    General: Skin is warm and dry.     Findings: No rash.  Neurological:     Mental Status: She is alert.     Motor: No abnormal muscle tone.     Coordination: Coordination normal.  Psychiatric:        Behavior: Behavior normal.      Results for orders placed or performed in visit on 09/06/19  CBC with Differential/Platelet  Result Value Ref Range   WBC 5.7 3.8 - 10.8 Thousand/uL   RBC 3.81 3.80 - 5.10 Million/uL   Hemoglobin 11.5 (L) 11.7 - 15.5 g/dL   HCT 35.5 35.0 - 45.0 %   MCV 93.2 80.0 - 100.0 fL   MCH 30.2 27.0 - 33.0 pg   MCHC 32.4 32.0 - 36.0 g/dL   RDW 11.5 11.0 - 15.0 %   Platelets 210 140 - 400 Thousand/uL  MPV 11.8 7.5 - 12.5 fL   Neutro Abs 3,015 1,500 - 7,800 cells/uL   Lymphs Abs 2,120 850 - 3,900 cells/uL   Absolute Monocytes 371 200 - 950 cells/uL   Eosinophils Absolute 103 15 - 500 cells/uL   Basophils Absolute 91 0 - 200  cells/uL   Neutrophils Relative % 52.9 %   Total Lymphocyte 37.2 %   Monocytes Relative 6.5 %   Eosinophils Relative 1.8 %   Basophils Relative 1.6 %  B12 and Folate Panel  Result Value Ref Range   Vitamin B-12 315 200 - 1,100 pg/mL   Folate 18.0 ng/mL        Assessment & Plan:      ICD-10-CM   1. Postpartum mastalgia  O92.29 MM Digital Diagnostic Bilat    US BREAST COMPLETE UNI LEFT INC AXILLA    US BREAST COMPLETE UNI RIGHT INC AXILLA  2. Breast swelling  N63.0 MM Digital Diagnostic Bilat    US BREAST COMPLETE UNI LEFT INC AXILLA    US BREAST COMPLETE UNI RIGHT INC AXILLA    Tender left breast x2 days on exam actually both her right and left breast are tender to palpation, left breast is much larger than the right she endorses that this is new.  Will order diagnostic tests and ultrasounds to further evaluate.  There were no skin changes, nipple discharge or palpable lymphadenopathy that I could appreciate on exam.    Delsa Grana, PA-C 11/20/19 8:20 AM

## 2019-11-21 ENCOUNTER — Telehealth: Payer: Self-pay

## 2019-11-21 NOTE — Telephone Encounter (Signed)
Copied from [place] [id number]. Topic: General - Inquiry >> Nov 21, 2019  9:26 AM Richardo Priest, NT wrote: Reason for CRM: Patient called in stating order for imaging was [facility] in [place]. Please advise as pt does not go to [place].

## 2019-12-04 ENCOUNTER — Ambulatory Visit (INDEPENDENT_AMBULATORY_CARE_PROVIDER_SITE_OTHER): Payer: BC Managed Care – PPO | Admitting: Obstetrics and Gynecology

## 2019-12-04 ENCOUNTER — Encounter: Payer: Self-pay | Admitting: Obstetrics and Gynecology

## 2019-12-04 ENCOUNTER — Other Ambulatory Visit: Payer: Self-pay

## 2019-12-04 VITALS — BP 114/74 | HR 61 | Ht 65.0 in | Wt 127.4 lb

## 2019-12-04 DIAGNOSIS — B9689 Other specified bacterial agents as the cause of diseases classified elsewhere: Secondary | ICD-10-CM

## 2019-12-04 DIAGNOSIS — R300 Dysuria: Secondary | ICD-10-CM

## 2019-12-04 DIAGNOSIS — Z79890 Hormone replacement therapy (postmenopausal): Secondary | ICD-10-CM

## 2019-12-04 DIAGNOSIS — N951 Menopausal and female climacteric states: Secondary | ICD-10-CM

## 2019-12-04 DIAGNOSIS — N760 Acute vaginitis: Secondary | ICD-10-CM

## 2019-12-04 DIAGNOSIS — L292 Pruritus vulvae: Secondary | ICD-10-CM | POA: Diagnosis not present

## 2019-12-04 LAB — POCT URINALYSIS DIPSTICK OB
Bilirubin, UA: NEGATIVE
Blood, UA: NEGATIVE
Glucose, UA: NEGATIVE
Ketones, UA: NEGATIVE
Leukocytes, UA: NEGATIVE
Nitrite, UA: NEGATIVE
POC,PROTEIN,UA: NEGATIVE
Spec Grav, UA: 1.01 (ref 1.010–1.025)
Urobilinogen, UA: 0.2 E.U./dL
pH, UA: 6.5 (ref 5.0–8.0)

## 2019-12-04 MED ORDER — METRONIDAZOLE 500 MG PO TABS: 500.0000 mg | ORAL_TABLET | Freq: Two times a day (BID) | ORAL | 0 refills | Status: AC

## 2019-12-04 MED ORDER — ESTRADIOL 1 MG PO TABS: 1.0000 mg | ORAL_TABLET | Freq: Every day | ORAL | 1 refills | Status: DC

## 2019-12-04 NOTE — Progress Notes (Signed)
HPI:      [name] is a 48 y.o. G0P0000 who LMP was Patient's last menstrual period was 07/11/2017 (exact date).  Subjective:   She presents today with several complaints.  She has been noticing vaginal dryness and increasing pain with intercourse.  She also states he is having significant hot flashes that affect her life. (She stopped her estrogen because her friends told her it could cause cancer) She also complains of vulvar itching and vaginal discharge.  She tried something over-the-counter on the outside but this has not helped. She is due for mammogram and it has been ordered but she has not scheduled this yet.    Hx: The following portions of the patient's history were reviewed and updated as appropriate:             She  has a past medical history of Anxiety, Asthma (2011), Depression, Diffuse cystic mastopathy, Endometriosis, GERD (gastroesophageal reflux disease), and Headache. She does not have any pertinent problems on file. She  has a past surgical history that includes Upper gi endoscopy (07/19/2012); Corneal transplant (Right, 01/07/2013); eyelid surgery (Right, June 19, 2015); Laparoscopic excision and fulguration of endometriosis (N/A); Laparoscopic bilateral salpingectomy (N/A, 01/25/2016); Eye surgery; Vaginal hysterectomy (N/A, 07/31/2017); Colonoscopy with propofol (N/A, 09/29/2017); Esophagogastroduodenoscopy (egd) with propofol (N/A, 09/29/2017); and Abdominal hysterectomy. Her family history includes Diabetes in her father; Heart failure (age of onset: 19) in her father; Hyperlipidemia in her mother; Hypertension in her mother. She was adopted. She  reports that she has never smoked. She has never used smokeless tobacco. She reports previous alcohol use. She reports that she does not use drugs. She has a current medication list which includes the following prescription(s): albuterol, alprazolam, baclofen, breo ellipta, ibuprofen, multiple vitamin, pantoprazole,  plecanatide, prednisolone acetate, promethazine, rizatriptan, trazodone, triamcinolone cream, valacyclovir, black cohosh, hydroxyzine, ketotifen, and travoprost (bak free). She is allergic to bactrim [sulfamethoxazole-trimethoprim] and penicillins.       Review of Systems:  Review of Systems  Constitutional: Denied constitutional symptoms, night sweats, recent illness, fatigue, fever, insomnia and weight loss.  Eyes: Denied eye symptoms, eye pain, photophobia, vision change and visual disturbance.  Ears/Nose/Throat/Neck: Denied ear, nose, throat or neck symptoms, hearing loss, nasal discharge, sinus congestion and sore throat.  Cardiovascular: Denied cardiovascular symptoms, arrhythmia, chest pain/pressure, edema, exercise intolerance, orthopnea and palpitations.  Respiratory: Denied pulmonary symptoms, asthma, pleuritic pain, productive sputum, cough, dyspnea and wheezing.  Gastrointestinal: Denied, gastro-esophageal reflux, melena, nausea and vomiting.  Genitourinary: See HPI for additional information.  Musculoskeletal: Denied musculoskeletal symptoms, stiffness, swelling, muscle weakness and myalgia.  Dermatologic: Denied dermatology symptoms, rash and scar.  Neurologic: Denied neurology symptoms, dizziness, headache, neck pain and syncope.  Psychiatric: Denied psychiatric symptoms, anxiety and depression.  Endocrine: Denied endocrine symptoms including hot flashes and night sweats.   Meds:   Current Outpatient Medications on File Prior to Visit  Medication Sig Dispense Refill  . albuterol (PROVENTIL HFA) 108 (90 Base) MCG/ACT inhaler Inhale 2 puffs into the lungs every 6 (six) hours as needed for wheezing or shortness of breath. 1 Inhaler 2  . ALPRAZolam (XANAX) 0.5 MG tablet Take 1 tablet (0.5 mg total) by mouth 2 (two) times daily as needed for anxiety. 10 tablet 0  . baclofen (LIORESAL) 10 MG tablet Take 1 tablet (10 mg total) by mouth 3 (three) times daily. 30 each 0  . fluticasone  furoate-vilanterol (BREO ELLIPTA) 100-25 MCG/INH AEPB INHALE 1 PUFF INTO THE LUNGS EVERY DAY 60 each 5  .  ibuprofen (ADVIL) 800 MG tablet Take 1 tablet (800 mg total) by mouth every 8 (eight) hours as needed. 30 tablet 1  . MULTIPLE VITAMIN PO Take 1 tablet by mouth daily.    . pantoprazole (PROTONIX) 40 MG tablet Take 1 tablet (40 mg total) by mouth 2 (two) times daily before a meal. 180 tablet 0  . Plecanatide (TRULANCE) 3 MG TABS Take 1 tablet by mouth daily. 90 tablet 1  . prednisoLONE acetate (PRED FORTE) 1 % ophthalmic suspension Place 1 drop into the right eye daily.     . promethazine (PHENERGAN) 12.5 MG tablet Take 1 tablet (12.5 mg total) by mouth every 8 (eight) hours as needed for nausea or vomiting. 20 tablet 0  . rizatriptan (MAXALT) 10 MG tablet Take 1 tablet (10 mg total) by mouth 2 (two) times daily as needed. 10 tablet 0  . traZODone (DESYREL) 50 MG tablet Take 0.5-2 tablets (25-100 mg total) by mouth at bedtime as needed for sleep. 60 tablet 0  . triamcinolone cream (KENALOG) 0.1 % Apply 1 application topically 2 (two) times daily as needed. 30 g 0  . valACYclovir (VALTREX) 500 MG tablet Take 1 tablet by mouth 2 (two) times daily.  0  . BLACK COHOSH EXTRACT PO Take by mouth.    . hydrOXYzine (ATARAX/VISTARIL) 10 MG tablet Take 1 tablet (10 mg total) by mouth 3 (three) times daily as needed. (Patient not taking: Reported on 12/04/2019) 90 tablet 1  . ketotifen (ZADITOR) 0.025 % ophthalmic solution Place 1 drop into the right eye daily.    . Travoprost, BAK Free, (TRAVATAN) 0.004 % SOLN ophthalmic solution Place 1 drop into the right eye at bedtime.     No current facility-administered medications on file prior to visit.    Objective:     Vitals:   12/04/19 0825  BP: 114/74  Pulse: 61              Physical examination General NAD, Conversant  HEENT Atraumatic; Op clear with mmm.  Normo-cephalic. Pupils reactive. Anicteric sclerae  Thyroid/Neck Smooth without nodularity  or enlargement. Normal ROM.  Neck Supple.  Skin No rashes, lesions or ulceration. Normal palpated skin turgor. No nodularity.  Breasts: No masses or discharge.  Symmetric.  No axillary adenopathy.  Lungs: Clear to auscultation.No rales or wheezes. Normal Respiratory effort, no retractions.  Heart: NSR.  No murmurs or rubs appreciated. No periferal edema  Abdomen: Soft.  Non-tender.  No masses.  No HSM. No hernia  Extremities: Moves all appropriately.  Normal ROM for age. No lymphadenopathy.  Neuro: Oriented to PPT.  Normal mood. Normal affect.     Pelvic:   Vulva: Normal appearance.  No lesions.   Vagina: No lesions or abnormalities noted.  Atrophy noted  Support: Normal pelvic support.  Urethra No masses tenderness or scarring.  Meatus Normal size without lesions or prolapse.  Cervix: Surgically absent   Anus: Normal exam.  No lesions.  Perineum: Normal exam.  No lesions.        Bimanual   Uterus: Surgically absent   Adnexae: No masses.  Non-tender to palpation.  Cul-de-sac: Negative for abnormality.   WET PREP: clue cells: present, KOH (yeast): negative, odor: present and trichomoniasis: negative Ph:  > 4.5   Assessment:    G0P0000 Patient Active Problem List   Diagnosis Date Noted  . Coronavirus infection 09/25/2019  . Moderate episode of recurrent major depressive disorder ([place]) 12/20/2018  . Iron deficiency anemia 12/19/2018  .  Type A blood, Rh positive 08/22/2017  . S/P vaginal hysterectomy 07/31/2017  . Recurrent vaginitis 01/10/2017  . Hemorrhoids, external without complications 0000000  . Nausea in adult patient 02/24/2016  . Endometriosis determined by laparoscopy 01/25/2016  . Chronic constipation 08/14/2015  . Monilial vaginitis 08/14/2015  . Bilateral ganglion cysts of wrists 08/14/2015  . Mild mitral regurgitation 05/19/2015  . B12 deficiency 05/18/2015  . Asthma, moderate 05/18/2015  . Anxiety 05/18/2015  . Insomnia, persistent 05/18/2015  . Eczema  of hand 05/18/2015  . Viral keratitis 05/18/2015  . Migraine without aura and without status migrainosus, not intractable 05/18/2015  . Numerous moles 05/18/2015  . Allergic rhinitis 05/18/2015  . History of corneal transplant 05/18/2015  . Gastroesophageal reflux disease without esophagitis 12/16/2009     1. Dysuria   2. Vulvar itching   3. Symptomatic menopausal or female climacteric states   4. Postmenopausal hormone therapy   5. Bacterial vulvovaginitis     Patient symptoms are mostly related to discontinuation of estrogen with hot flashes, mood changes vaginal dryness, pain with intercourse.  Patient additionally found to have BV likely the cause of her vulvar itching and vaginal discharge.  Plan:            Basic Screening Recommendations The basic screening recommendations for asymptomatic women were discussed with the patient during her visit.  The age-appropriate recommendations were discussed with her and the rational for the tests reviewed.  When I am informed by the patient that another primary care physician has previously obtained the age-appropriate tests and they are up-to-date, only outstanding tests are ordered and referrals given as necessary.  Abnormal results of tests will be discussed with her when all of her results are completed.  Routine preventative health maintenance measures emphasized: Exercise/Diet/Weight control, Tobacco Warnings, Alcohol/Substance use risks and Stress Management Patient encouraged to get mammogram Flagyl for BV Patient interested in restarting HRT/ERT -we will start Estrace 1 mg-this is the dose patient would like to start with this time.  She is requested to follow-up in 3 months.  Orders Orders Placed This Encounter  Procedures  . POC Urinalysis Dipstick OB    No orders of the defined types were placed in this encounter.     F/U  Return in about 3 months (around 03/03/2020) for Annual Physical. I spent 27 minutes involved in the  care of this patient of which greater than 50% was spent discussing ERT risk benefits, all questions answered.  Bacterial vaginosis treatment.  All questions answered  Finis Bud, M.D. 12/04/2019 9:04 AM

## 2019-12-16 ENCOUNTER — Inpatient Hospital Stay: Payer: BC Managed Care – PPO | Admitting: Oncology

## 2019-12-16 ENCOUNTER — Inpatient Hospital Stay: Payer: BC Managed Care – PPO

## 2019-12-20 ENCOUNTER — Ambulatory Visit: Admission: RE | Admit: 2019-12-20 | Payer: BC Managed Care – PPO | Source: Ambulatory Visit

## 2019-12-20 ENCOUNTER — Ambulatory Visit
Admission: RE | Admit: 2019-12-20 | Discharge: 2019-12-20 | Disposition: A | Discharge status: Home / Self Care | Payer: BC Managed Care – PPO | Source: Ambulatory Visit | Attending: Family Medicine | Admitting: Family Medicine

## 2019-12-20 DIAGNOSIS — O9229 Other disorders of breast associated with pregnancy and the puerperium: Secondary | ICD-10-CM | POA: Diagnosis not present

## 2019-12-20 DIAGNOSIS — N63 Unspecified lump in unspecified breast: Secondary | ICD-10-CM | POA: Insufficient documentation

## 2019-12-20 DIAGNOSIS — R922 Inconclusive mammogram: Secondary | ICD-10-CM | POA: Diagnosis not present

## 2019-12-24 ENCOUNTER — Other Ambulatory Visit: Payer: Self-pay

## 2019-12-24 ENCOUNTER — Encounter: Payer: Self-pay | Admitting: Family Medicine

## 2019-12-24 ENCOUNTER — Ambulatory Visit (INDEPENDENT_AMBULATORY_CARE_PROVIDER_SITE_OTHER): Payer: BC Managed Care – PPO | Admitting: Family Medicine

## 2019-12-24 VITALS — BP 108/72 | HR 63 | Temp 96.8°F | Resp 14 | Ht 65.0 in | Wt 126.7 lb

## 2019-12-24 DIAGNOSIS — Z13220 Encounter for screening for lipoid disorders: Secondary | ICD-10-CM | POA: Diagnosis not present

## 2019-12-24 DIAGNOSIS — E538 Deficiency of other specified B group vitamins: Secondary | ICD-10-CM

## 2019-12-24 DIAGNOSIS — Z862 Personal history of diseases of the blood and blood-forming organs and certain disorders involving the immune mechanism: Secondary | ICD-10-CM

## 2019-12-24 DIAGNOSIS — Z131 Encounter for screening for diabetes mellitus: Secondary | ICD-10-CM | POA: Diagnosis not present

## 2019-12-24 DIAGNOSIS — Z0000 Encounter for general adult medical examination without abnormal findings: Secondary | ICD-10-CM | POA: Diagnosis not present

## 2019-12-24 DIAGNOSIS — Z79899 Other long term (current) drug therapy: Secondary | ICD-10-CM

## 2019-12-24 DIAGNOSIS — F419 Anxiety disorder, unspecified: Secondary | ICD-10-CM

## 2019-12-24 DIAGNOSIS — Z23 Encounter for immunization: Secondary | ICD-10-CM

## 2019-12-24 DIAGNOSIS — K5909 Other constipation: Secondary | ICD-10-CM

## 2019-12-24 DIAGNOSIS — F320 Major depressive disorder, single episode, mild: Secondary | ICD-10-CM

## 2019-12-24 DIAGNOSIS — F5104 Psychophysiologic insomnia: Secondary | ICD-10-CM

## 2019-12-24 DIAGNOSIS — Z1159 Encounter for screening for other viral diseases: Secondary | ICD-10-CM

## 2019-12-24 DIAGNOSIS — J4540 Moderate persistent asthma, uncomplicated: Secondary | ICD-10-CM

## 2019-12-24 MED ORDER — TRAZODONE HCL 50 MG PO TABS: 25.0000 mg | ORAL_TABLET | Freq: Every evening | ORAL | 1 refills | Status: DC | PRN

## 2019-12-24 MED ORDER — BREO ELLIPTA 100-25 MCG/INH IN AEPB: INHALATION_SPRAY | RESPIRATORY_TRACT | 5 refills | Status: DC

## 2019-12-24 MED ORDER — ESCITALOPRAM OXALATE 5 MG PO TABS: 5.0000 mg | ORAL_TABLET | Freq: Every day | ORAL | 1 refills | Status: DC

## 2019-12-24 MED ORDER — TRULANCE 3 MG PO TABS: 1.0000 | ORAL_TABLET | Freq: Every day | ORAL | 1 refills | Status: DC

## 2019-12-24 NOTE — Patient Instructions (Signed)

## 2019-12-24 NOTE — Progress Notes (Signed)
Name: Jessica Carroll   MRN: [id number]    DOB: 02/01/1971   Date:12/24/2019       Progress Note  Subjective  Chief Complaint  Chief Complaint  Patient presents with  . Annual Exam  . Medication Refill    HPI  Patient presents for annual CPE and follow up  Depression/anxiety/Insomnia: phq 9 is still positive at 5 today, she feels anxious all the time, always on edge, also has difficulty staying asleep, mind wonders and worries about everything, hydroxyzine helps her fall asleep but cannot stay asleep. Feeling down and lack of motivation. Discussed adding SSRI and a different medication for sleep. She states she has a recurrent episodes of depression, she took paxil in the past, but she had lack of libido. We will try lexapro at lower dose and see if helps   Chronic constipation: taking Trulance daily and works well for her, no straining, blood in stools   Asthma moderated: had COVID-19 in October, recently she has noticed some sob while walking up a hill, discussed importance of using rescue inhaler, do more hill walks, wear a bandana or mask when exposed to cold air and monitor. She is active at work and no problems when indoors.   Diet: eats lots of salads, drinks water, eats fish once a week also likes tree nuts  Exercise: works two jobs does not have time for exercise , but is active work   USPSTF grade A and B recommendations    [facility] Visit from 11/20/2019 in [facility]  AUDIT-C Score  0       Office Visit from 12/24/2019 in [facility]  PHQ-9 Total Score  5    Hypertension: BP Readings from Last 3 Encounters:  12/24/19 108/72  12/04/19 114/74  11/20/19 108/68   Obesity: Wt Readings from Last 3 Encounters:  12/24/19 126 lb 11.2 oz (57.5 kg)  12/04/19 127 lb 6.4 oz (57.8 kg)  11/20/19 126 lb 9.6 oz (57.4 kg)   BMI Readings from Last 3 Encounters:  12/24/19 21.08 kg/m  12/04/19 21.20 kg/m  11/20/19 21.07 kg/m     Hep C  Screening: today  STD testing and prevention (HIV/chl/gon/syphilis): N/A Intimate partner violence:negative  Sexual History (Partners/Practices/Protection from [employer] hx STI): one sexual partner, living together for years, no history of STI Pain during Intercourse: no problems  Menstrual History/LMP/Abnormal Bleeding: s/p hysterectomy  Incontinence Symptoms: no symptoms   Breast cancer:  - Last Mammogram: 12/2019  - BRCA gene screening: N/A  Osteoporosis: Discussed high calcium and vitamin D supplementation, weight bearing exercises  Cervical cancer screening: N/A s/p hysterectomy   Skin cancer: Discussed monitoring for atypical lesions  Colorectal cancer: up to date 09/2017 Lung cancer:   Low Dose CT Chest recommended if Age 35-80 years, 30 pack-year currently smoking OR have quit w/in 15years. Patient does not qualify.   ECG: 2011   Advanced Care Planning: A voluntary discussion about advance care planning including the explanation and discussion of advance directives.  Discussed health care proxy and Living will, and the patient was able to identify a health care proxy as mother .  Patient does not have a living will at present time.  Lipids: Lab Results  Component Value Date   CHOL 183 10/17/2018   CHOL 172 08/22/2017   CHOL 179 08/17/2016   Lab Results  Component Value Date   HDL 62 10/17/2018   HDL 53 08/22/2017   HDL 66 08/17/2016   Lab Results  Component Value Date   LDLCALC 108 (H) 10/17/2018   LDLCALC 102 (H) 08/22/2017   LDLCALC 101 08/17/2016   Lab Results  Component Value Date   TRIG 51 10/17/2018   TRIG 76 08/22/2017   TRIG 58 08/17/2016   Lab Results  Component Value Date   CHOLHDL 3.0 10/17/2018   CHOLHDL 3.2 08/22/2017   CHOLHDL 2.7 08/17/2016   No results found for: LDLDIRECT  Glucose: Glucose, Bld  Date Value Ref Range Status  12/06/2018 87 70 - 99 mg/dL Final  10/17/2018 82 65 - 99 mg/dL Final    Comment:    .            Fasting  reference interval .   08/22/2017 86 65 - 99 mg/dL Final    Comment:    .            Fasting reference interval .     Patient Active Problem List   Diagnosis Date Noted  . Coronavirus infection 09/25/2019  . Moderate episode of recurrent major depressive disorder ([place]) 12/20/2018  . Iron deficiency anemia 12/19/2018  . Type A blood, Rh positive 08/22/2017  . S/P vaginal hysterectomy 07/31/2017  . Recurrent vaginitis 01/10/2017  . Hemorrhoids, external without complications 16/09/9603  . Nausea in adult patient 02/24/2016  . Endometriosis determined by laparoscopy 01/25/2016  . Chronic constipation 08/14/2015  . Monilial vaginitis 08/14/2015  . Bilateral ganglion cysts of wrists 08/14/2015  . Mild mitral regurgitation 05/19/2015  . B12 deficiency 05/18/2015  . Asthma, moderate 05/18/2015  . Anxiety 05/18/2015  . Insomnia, persistent 05/18/2015  . Eczema of hand 05/18/2015  . Viral keratitis 05/18/2015  . Migraine without aura and without status migrainosus, not intractable 05/18/2015  . Numerous moles 05/18/2015  . Allergic rhinitis 05/18/2015  . History of corneal transplant 05/18/2015  . Gastroesophageal reflux disease without esophagitis 12/16/2009    Past Surgical History:  Procedure Laterality Date  . ABDOMINAL HYSTERECTOMY    . COLONOSCOPY WITH PROPOFOL N/A 09/29/2017   Procedure: COLONOSCOPY WITH PROPOFOL;  Surgeon: Jonathon Bellows, MD;  [facility] END[facility];  Service: Gastroenterology;  Laterality: N/A;  . CORNEAL TRANSPLANT Right 01/07/2013  . ESOPHAGOGASTRODUODENOSCOPY (EGD) WITH PROPOFOL N/A 09/29/2017   Procedure: ESOPHAGOGASTRODUODENOSCOPY (EGD) WITH PROPOFOL;  Surgeon: Jonathon Bellows, MD;  Location: [facility] ENDOSCOPY;  Service: Gastroenterology;  Laterality: N/A;  . EYE SURGERY    . eyelid surgery Right June 19, 2015   [facility] by Dr. Norlene Duel  . LAPAROSCOPIC BILATERAL SALPINGECTOMY N/A 01/25/2016   Procedure: LAPAROSCOPIC BILATERAL SALPINGECTOMY,  ADHESIOLYSIS, LAPAROSCOPIC EXCISION/FULGERATION ENDOMETRIOSIS ;  Surgeon: [name], MD;  Location: [facility] ORS;  Service: Gynecology;  Laterality: N/A;  . Laparoscopic excision and fulguration of endometriosis N/A    bladder flap and uterosacral ligament endometriosis, excised and cauterized  . UPPER GI ENDOSCOPY  07/19/2012  . VAGINAL HYSTERECTOMY N/A 07/31/2017   Procedure: HYSTERECTOMY VAGINAL;  Surgeon: [name], MD;  Location: [facility] ORS;  Service: Gynecology;  Laterality: N/A;    Family History  Adopted: Yes  Problem Relation Age of Onset  . Hyperlipidemia Mother   . Hypertension Mother   . Diabetes Father   . Heart failure Father 98       hear attack  . Cancer Neg Hx   . Breast cancer Neg Hx     Social History   Socioeconomic History  . Marital status: Single    Spouse name: Not on file  . Number of children: 0  . Years  of education: 41  . Highest education level: High school graduate  Occupational History  . Occupation: textile  Tobacco Use  . Smoking status: Never Smoker  . Smokeless tobacco: Never Used  Substance and Sexual Activity  . Alcohol use: Not Currently    Alcohol/week: 0.0 standard drinks    Comment: occ. wine  . Drug use: No  . Sexual activity: Yes    Partners: Male    Birth control/protection: None, Surgical  Other Topics Concern  . Not on file  Social History Narrative   Engaged to get married to her boyfriend of 14 years.   Social Determinants of Health   Financial Resource Strain: Low Risk   . Difficulty of Paying Living Expenses: Not very hard  Food Insecurity: Food Insecurity Present  . Worried About Charity fundraiser in the Last Year: Often true  . Ran Out of Food in the Last Year: Often true  Transportation Needs: No Transportation Needs  . Lack of Transportation (Medical): No  . Lack of Transportation (Non-Medical): No  Physical Activity: Sufficiently Active  . Days of Exercise per Week: 5 days  . Minutes of  Exercise per Session: 90 min  Stress: Stress Concern Present  . Feeling of Stress : Very much  Social Connections: Not Isolated  . Frequency of Communication with Friends and Family: More than three times a week  . Frequency of Social Gatherings with Friends and Family: More than three times a week  . Attends Religious Services: More than 4 times per year  . Active Member of Clubs or Organizations: Yes  . Attends Archivist Meetings: More than 4 times per year  . Marital Status: Living with partner  Intimate Partner Violence: Not At Risk  . Fear of Current or Ex-Partner: No  . Emotionally Abused: No  . Physically Abused: No  . Sexually Abused: No     Current Outpatient Medications:  .  albuterol (PROVENTIL HFA) 108 (90 Base) MCG/ACT inhaler, Inhale 2 puffs into the lungs every 6 (six) hours as needed for wheezing or shortness of breath., Disp: 1 Inhaler, Rfl: 2 .  ALPRAZolam (XANAX) 0.5 MG tablet, Take 1 tablet (0.5 mg total) by mouth 2 (two) times daily as needed for anxiety., Disp: 10 tablet, Rfl: 0 .  baclofen (LIORESAL) 10 MG tablet, Take 1 tablet (10 mg total) by mouth 3 (three) times daily., Disp: 30 each, Rfl: 0 .  carboxymethylcellul-glycerin (REFRESH OPTIVE) 0.5-0.9 % ophthalmic solution, Frequency:PHARMDIR   Dosage:0.0     Instructions:  Note:6 x OU Dose: 0.5%-0.9%, Disp: , Rfl:  .  estradiol (ESTRACE) 1 MG tablet, Take 1 tablet (1 mg total) by mouth daily., Disp: 90 tablet, Rfl: 1 .  fluticasone furoate-vilanterol (BREO ELLIPTA) 100-25 MCG/INH AEPB, INHALE 1 PUFF INTO THE LUNGS EVERY DAY, Disp: 60 each, Rfl: 5 .  ibuprofen (ADVIL) 800 MG tablet, Take 1 tablet (800 mg total) by mouth every 8 (eight) hours as needed., Disp: 30 tablet, Rfl: 1 .  MULTIPLE VITAMIN PO, Take 1 tablet by mouth daily., Disp: , Rfl:  .  pantoprazole (PROTONIX) 40 MG tablet, Take 1 tablet (40 mg total) by mouth 2 (two) times daily before a meal., Disp: 180 tablet, Rfl: 0 .  Plecanatide  (TRULANCE) 3 MG TABS, Take 1 tablet by mouth daily., Disp: 90 tablet, Rfl: 1 .  prednisoLONE acetate (PRED FORTE) 1 % ophthalmic suspension, Place 1 drop into the right eye daily. , Disp: , Rfl:  .  promethazine (  PHENERGAN) 12.5 MG tablet, Take 1 tablet (12.5 mg total) by mouth every 8 (eight) hours as needed for nausea or vomiting., Disp: 20 tablet, Rfl: 0 .  rizatriptan (MAXALT) 10 MG tablet, Take 1 tablet (10 mg total) by mouth 2 (two) times daily as needed., Disp: 10 tablet, Rfl: 0 .  traZODone (DESYREL) 50 MG tablet, Take 0.5-2 tablets (25-100 mg total) by mouth at bedtime as needed for sleep., Disp: 60 tablet, Rfl: 0 .  triamcinolone cream (KENALOG) 0.1 %, Apply 1 application topically 2 (two) times daily as needed., Disp: 30 g, Rfl: 0 .  valACYclovir (VALTREX) 500 MG tablet, Take 1 tablet by mouth 2 (two) times daily., Disp: , Rfl: 0 .  BLACK COHOSH EXTRACT PO, Take by mouth., Disp: , Rfl:  .  hydrOXYzine (ATARAX/VISTARIL) 10 MG tablet, Take 1 tablet (10 mg total) by mouth 3 (three) times daily as needed. (Patient not taking: Reported on 12/04/2019), Disp: 90 tablet, Rfl: 1 .  ketotifen (ZADITOR) 0.025 % ophthalmic solution, Place 1 drop into the right eye daily., Disp: , Rfl:  .  Travoprost, BAK Free, (TRAVATAN) 0.004 % SOLN ophthalmic solution, Place 1 drop into the right eye at bedtime., Disp: , Rfl:   Allergies  Allergen Reactions  . Bactrim [Sulfamethoxazole-Trimethoprim] Nausea Only  . Penicillins Nausea Only     ROS  Constitutional: Negative for fever or weight change.  Respiratory: Negative for cough and shortness of breath.   Cardiovascular: Negative for chest pain or palpitations.  Gastrointestinal: Negative for abdominal pain, no bowel changes.  Musculoskeletal: Negative for gait problem or joint swelling.  Skin: Negative for rash.  Neurological: Negative for dizziness or headache.  No other specific complaints in a complete review of systems (except as listed in HPI  above).  Objective  Vitals:   12/24/19 0750  BP: 108/72  Pulse: 63  Resp: 14  Temp: (!) 96.8 F (36 C)  SpO2: 98%  Weight: 126 lb 11.2 oz (57.5 kg)  Height: '5\' 5"'  (1.651 m)    Body mass index is 21.08 kg/m.  Physical Exam  Constitutional: Patient appears well-developed and well-nourished. No distress.  HENT: Head: Normocephalic and atraumatic. Ears: B TMs ok, no erythema or effusion; Nose: Nose normal. Mouth/Throat: npt done   Eyes: Conjunctivae and EOM are normal. Pupils are equal, round, and reactive to light. No scleral icterus.  Neck: Normal range of motion. Neck supple. No JVD present. No thyromegaly present.  Cardiovascular: Normal rate, regular rhythm and normal heart sounds.  No murmur heard. No BLE edema. Pulmonary/Chest: Effort normal and breath sounds normal. No respiratory distress. Abdominal: Soft. Bowel sounds are normal, no distension. There is no tenderness. no masses Breast: no lumps or masses, no nipple discharge or rashes FEMALE GENITALIA:  Not done RECTAL: not done Musculoskeletal: Normal range of motion, no joint effusions. No gross deformities Neurological: he is alert and oriented to person, place, and time. No cranial nerve deficit. Coordination, balance, strength, speech and gait are normal.  Skin: Skin is warm and dry. No rash noted. No erythema.  Psychiatric: Patient has a normal mood and affect. behavior is normal. Judgment and thought content normal.  Recent Results (from the past 2160 hour(s))  POC Urinalysis Dipstick OB     Status: None   Collection Time: 12/04/19  8:34 AM  Result Value Ref Range   Color, UA yellow    Clarity, UA clear    Glucose, UA Negative Negative   Bilirubin, UA neg  Ketones, UA neg    Spec Grav, UA 1.010 1.010 - 1.025   Blood, UA neg    pH, UA 6.5 5.0 - 8.0   POC,PROTEIN,UA Negative Negative, Trace, Small (1+), Moderate (2+), Large (3+), 4+   Urobilinogen, UA 0.2 0.2 or 1.0 E.U./dL   Nitrite, UA neg     Leukocytes, UA Negative Negative   Appearance     Odor        Fall Risk: Fall Risk  12/24/2019 11/20/2019 10/11/2019 09/27/2019 09/06/2019  Falls in the past year? 0 0 0 0 0  Number falls in past yr: 0 0 0 0 0  Injury with Fall? 0 0 0 0 0  Risk for fall due to : - - - - -     Functional Status Survey: Is the patient deaf or have difficulty hearing?: No Does the patient have difficulty seeing, even when wearing glasses/contacts?: No Does the patient have difficulty concentrating, remembering, or making decisions?: No Does the patient have difficulty walking or climbing stairs?: No Does the patient have difficulty dressing or bathing?: No Does the patient have difficulty doing errands alone such as visiting a doctor's office or shopping?: No   Assessment & Plan  1. Well adult exam   2. Need for Tdap vaccination  - Tdap vaccine greater than or equal to 7yo IM  3. B12 deficiency  - B12 and Folate Panel - CBC with Differential/Platelet  4. History of iron deficiency anemia  - CBC with Differential/Platelet - Iron, TIBC and Ferritin Panel  5. Screening for diabetes mellitus  - Hemoglobin A1c  6. Lipid screening  - Lipid panel  7. Long-term use of high-risk medication  - Comprehensive metabolic panel  8. Need for hepatitis C screening test  - Hepatitis C antibody  9. Mild major depression (HCC)  - escitalopram (LEXAPRO) 5 MG tablet; Take 1 tablet (5 mg total) by mouth daily.  Dispense: 30 tablet; Refill: 1  10. Anxiety  - escitalopram (LEXAPRO) 5 MG tablet; Take 1 tablet (5 mg total) by mouth daily.  Dispense: 30 tablet; Refill: 1  11. Psychophysiological insomnia  - traZODone (DESYREL) 50 MG tablet; Take 0.5-1 tablets (25-50 mg total) by mouth at bedtime as needed for sleep.  Dispense: 30 tablet; Refill: 1  12. Chronic constipation  - Plecanatide (TRULANCE) 3 MG TABS; Take 1 tablet by mouth daily.  Dispense: 90 tablet; Refill: 1  13. Moderate  persistent asthma without complication  - fluticasone furoate-vilanterol (BREO ELLIPTA) 100-25 MCG/INH AEPB; INHALE 1 PUFF INTO THE LUNGS EVERY DAY  Dispense: 60 each; Refill: 5  -USPSTF grade A and B recommendations reviewed with patient; age-appropriate recommendations, preventive care, screening tests, etc discussed and encouraged; healthy living encouraged; see AVS for patient education given to patient -Discussed importance of 150 minutes of physical activity weekly, eat two servings of fish weekly, eat one serving of tree nuts ( cashews, pistachios, pecans, almonds.Marland Kitchen) every other day, eat 6 servings of fruit/vegetables daily and drink plenty of water and avoid sweet beverages.

## 2019-12-25 ENCOUNTER — Telehealth: Payer: Self-pay

## 2019-12-25 LAB — CBC WITH DIFFERENTIAL/PLATELET
Basophils Absolute: 0.1 10*3/uL (ref 0.0–0.2)
Basos: 1 %
EOS (ABSOLUTE): 0.1 10*3/uL (ref 0.0–0.4)
Eos: 1 %
Hematocrit: 37.2 % (ref 34.0–46.6)
Hemoglobin: 12.2 g/dL (ref 11.1–15.9)
Immature Grans (Abs): 0 10*3/uL (ref 0.0–0.1)
Immature Granulocytes: 0 %
Lymphocytes Absolute: 1.9 10*3/uL (ref 0.7–3.1)
Lymphs: 32 %
MCH: 30.8 pg (ref 26.6–33.0)
MCHC: 32.8 g/dL (ref 31.5–35.7)
MCV: 94 fL (ref 79–97)
Monocytes Absolute: 0.4 10*3/uL (ref 0.1–0.9)
Monocytes: 6 %
Neutrophils Absolute: 3.5 10*3/uL (ref 1.4–7.0)
Neutrophils: 60 %
Platelets: 199 10*3/uL (ref 150–450)
RBC: 3.96 x10E6/uL (ref 3.77–5.28)
RDW: 12.2 % (ref 11.7–15.4)
WBC: 5.9 10*3/uL (ref 3.4–10.8)

## 2019-12-25 LAB — COMPREHENSIVE METABOLIC PANEL
ALT: 13 IU/L (ref 0–32)
AST: 15 IU/L (ref 0–40)
Albumin/Globulin Ratio: 2.1 (ref 1.2–2.2)
Albumin: 4.7 g/dL (ref 3.8–4.8)
Alkaline Phosphatase: 50 IU/L (ref 39–117)
BUN/Creatinine Ratio: 17 (ref 9–23)
BUN: 13 mg/dL (ref 6–24)
Bilirubin Total: 1.3 mg/dL — ABNORMAL HIGH (ref 0.0–1.2)
CO2: 23 mmol/L (ref 20–29)
Calcium: 9.5 mg/dL (ref 8.7–10.2)
Chloride: 104 mmol/L (ref 96–106)
Creatinine, Ser: 0.76 mg/dL (ref 0.57–1.00)
GFR calc Af Amer: 107 mL/min/{1.73_m2} (ref >59)
GFR calc non Af Amer: 92 mL/min/{1.73_m2} (ref >59)
Globulin, Total: 2.2 g/dL (ref 1.5–4.5)
Glucose: 87 mg/dL (ref 65–99)
Potassium: 4 mmol/L (ref 3.5–5.2)
Sodium: 140 mmol/L (ref 134–144)
Total Protein: 6.9 g/dL (ref 6.0–8.5)

## 2019-12-25 LAB — IRON,TIBC AND FERRITIN PANEL
Ferritin: 328 ng/mL — ABNORMAL HIGH (ref 15–150)
Iron Saturation: 36 % (ref 15–55)
Iron: 87 ug/dL (ref 27–159)
Total Iron Binding Capacity: 241 ug/dL — ABNORMAL LOW (ref 250–450)
UIBC: 154 ug/dL (ref 131–425)

## 2019-12-25 LAB — LIPID PANEL
Chol/HDL Ratio: 2.9 ratio (ref 0.0–4.4)
Cholesterol, Total: 197 mg/dL (ref 100–199)
HDL: 68 mg/dL (ref >39)
LDL Chol Calc (NIH): 118 mg/dL — ABNORMAL HIGH (ref 0–99)
Triglycerides: 58 mg/dL (ref 0–149)
VLDL Cholesterol Cal: 11 mg/dL (ref 5–40)

## 2019-12-25 LAB — HEPATITIS C ANTIBODY: Hep C Virus Ab: <0.1 s/co ratio (ref 0.0–0.9)

## 2019-12-25 LAB — HEMOGLOBIN A1C
Est. average glucose Bld gHb Est-mCnc: 97 mg/dL
Hgb A1c MFr Bld: 5 % (ref 4.8–5.6)

## 2019-12-25 LAB — B12 AND FOLATE PANEL
Folate: 14.4 ng/mL (ref >3.0)
Vitamin B-12: 214 pg/mL — ABNORMAL LOW (ref 232–1245)

## 2019-12-25 NOTE — Telephone Encounter (Signed)
Copied from [place] [id number]. Topic: General - Inquiry >> Dec 25, 2019  3:53 PM Jessica Carroll wrote: Reason for CRM: Pt called to have nurse call her and go over lab results she was not able to understand/ please advise

## 2019-12-26 ENCOUNTER — Ambulatory Visit: Payer: BC Managed Care – PPO | Admitting: Gastroenterology

## 2020-01-03 ENCOUNTER — Encounter: Payer: Self-pay | Admitting: Family Medicine

## 2020-01-03 ENCOUNTER — Ambulatory Visit (INDEPENDENT_AMBULATORY_CARE_PROVIDER_SITE_OTHER): Payer: BC Managed Care – PPO | Admitting: Family Medicine

## 2020-01-03 ENCOUNTER — Other Ambulatory Visit: Payer: Self-pay

## 2020-01-03 DIAGNOSIS — J3089 Other allergic rhinitis: Secondary | ICD-10-CM

## 2020-01-03 DIAGNOSIS — J4541 Moderate persistent asthma with (acute) exacerbation: Secondary | ICD-10-CM | POA: Diagnosis not present

## 2020-01-03 DIAGNOSIS — B3731 Acute candidiasis of vulva and vagina: Secondary | ICD-10-CM

## 2020-01-03 DIAGNOSIS — B373 Candidiasis of vulva and vagina: Secondary | ICD-10-CM

## 2020-01-03 MED ORDER — PREDNISONE 10 MG PO TABS: ORAL_TABLET | ORAL | 0 refills | Status: DC

## 2020-01-03 MED ORDER — SALINE SPRAY 0.65 % NA SOLN: 1.0000 | NASAL | 1 refills | Status: DC | PRN

## 2020-01-03 MED ORDER — TRIAMCINOLONE ACETONIDE 55 MCG/ACT NA AERO: 2.0000 | INHALATION_SPRAY | Freq: Every day | NASAL | 12 refills | Status: DC

## 2020-01-03 MED ORDER — LEVOCETIRIZINE DIHYDROCHLORIDE 5 MG PO TABS: 5.0000 mg | ORAL_TABLET | Freq: Every evening | ORAL | 2 refills | Status: DC

## 2020-01-03 NOTE — Progress Notes (Signed)
Name: Jessica Carroll   MRN: [id number]    DOB: 1971-01-11   Date:01/03/2020       Progress Note  Subjective  Chief Complaint  Chief Complaint  Patient presents with  . Sinusitis    cough, headache, nose sore, green mucous    I connected with  Jessica Carroll  on 01/03/20 at 10:00 AM EST by a video enabled telemedicine application and verified that I am speaking with the correct person using two identifiers.  I discussed the limitations of evaluation and management by telemedicine and the availability of in person appointments. The patient expressed understanding and agreed to proceed. Staff also discussed with the patient that there may be a patient responsible charge related to this service. Patient Location: Home Provider Location: Office Additional Individuals present: None  HPI  Pt presents with concern for respiratory illness for about 2 days.  Her work has moved her to a different department recently, and developed symptoms 01/01/20.  She has had a bad headache and some swelling around her nose in the morning, when she blows her nose there is blood and green mucus.  She also notes productive cough the started yesterday. Denies fatigue, N/V/D, abdominal pain, chest pain, shortness of breath, no changes in taste or smell, no fevers/chills.  Did have COVID-19 approx 09/30/2019 and states this feels very different.   She does have asthma - on Breo and albuterol.  She has tried pseudafed and sinus congestion medication; also using Breo and Albuterol as prescribed. She is not using any nasal sprays.  Has humidifier but has not been using it regularly. Not on antihistamine.  Patient Active Problem List   Diagnosis Date Noted  . Coronavirus infection 09/25/2019  . Moderate episode of recurrent major depressive disorder ([place]) 12/20/2018  . Iron deficiency anemia 12/19/2018  . Type A blood, Rh positive 08/22/2017  . S/P vaginal hysterectomy 07/31/2017  . Recurrent vaginitis 01/10/2017    . Hemorrhoids, external without complications 0000000  . Nausea in adult patient 02/24/2016  . Endometriosis determined by laparoscopy 01/25/2016  . Chronic constipation 08/14/2015  . Monilial vaginitis 08/14/2015  . Bilateral ganglion cysts of wrists 08/14/2015  . Mild mitral regurgitation 05/19/2015  . B12 deficiency 05/18/2015  . Asthma, moderate 05/18/2015  . Anxiety 05/18/2015  . Insomnia, persistent 05/18/2015  . Eczema of hand 05/18/2015  . Viral keratitis 05/18/2015  . Migraine without aura and without status migrainosus, not intractable 05/18/2015  . Numerous moles 05/18/2015  . Allergic rhinitis 05/18/2015  . History of corneal transplant 05/18/2015  . Gastroesophageal reflux disease without esophagitis 12/16/2009    Social History   Tobacco Use  . Smoking status: Never Smoker  . Smokeless tobacco: Never Used  Substance Use Topics  . Alcohol use: Not Currently    Alcohol/week: 0.0 standard drinks    Comment: occ. wine     Current Outpatient Medications:  .  albuterol (PROVENTIL HFA) 108 (90 Base) MCG/ACT inhaler, Inhale 2 puffs into the lungs every 6 (six) hours as needed for wheezing or shortness of breath., Disp: 1 Inhaler, Rfl: 2 .  ALPRAZolam (XANAX) 0.5 MG tablet, Take 1 tablet (0.5 mg total) by mouth 2 (two) times daily as needed for anxiety., Disp: 10 tablet, Rfl: 0 .  baclofen (LIORESAL) 10 MG tablet, Take 1 tablet (10 mg total) by mouth 3 (three) times daily., Disp: 30 each, Rfl: 0 .  carboxymethylcellul-glycerin (REFRESH OPTIVE) 0.5-0.9 % ophthalmic solution, Frequency:PHARMDIR   Dosage:0.0  Instructions:  Note:6 x OU Dose: 0.5%-0.9%, Disp: , Rfl:  .  escitalopram (LEXAPRO) 5 MG tablet, Take 1 tablet (5 mg total) by mouth daily., Disp: 30 tablet, Rfl: 1 .  estradiol (ESTRACE) 1 MG tablet, Take 1 tablet (1 mg total) by mouth daily., Disp: 90 tablet, Rfl: 1 .  fluticasone furoate-vilanterol (BREO ELLIPTA) 100-25 MCG/INH AEPB, INHALE 1 PUFF INTO THE  LUNGS EVERY DAY, Disp: 60 each, Rfl: 5 .  hydrOXYzine (ATARAX/VISTARIL) 10 MG tablet, Take 1 tablet (10 mg total) by mouth 3 (three) times daily as needed., Disp: 90 tablet, Rfl: 1 .  ibuprofen (ADVIL) 800 MG tablet, Take 1 tablet (800 mg total) by mouth every 8 (eight) hours as needed., Disp: 30 tablet, Rfl: 1 .  ketotifen (ZADITOR) 0.025 % ophthalmic solution, Place 1 drop into the right eye daily., Disp: , Rfl:  .  MULTIPLE VITAMIN PO, Take 1 tablet by mouth daily., Disp: , Rfl:  .  pantoprazole (PROTONIX) 40 MG tablet, Take 1 tablet (40 mg total) by mouth 2 (two) times daily before a meal., Disp: 180 tablet, Rfl: 0 .  Plecanatide (TRULANCE) 3 MG TABS, Take 1 tablet by mouth daily., Disp: 90 tablet, Rfl: 1 .  prednisoLONE acetate (PRED FORTE) 1 % ophthalmic suspension, Place 1 drop into the right eye daily. , Disp: , Rfl:  .  promethazine (PHENERGAN) 12.5 MG tablet, Take 1 tablet (12.5 mg total) by mouth every 8 (eight) hours as needed for nausea or vomiting., Disp: 20 tablet, Rfl: 0 .  rizatriptan (MAXALT) 10 MG tablet, Take 1 tablet (10 mg total) by mouth 2 (two) times daily as needed., Disp: 10 tablet, Rfl: 0 .  Travoprost, BAK Free, (TRAVATAN) 0.004 % SOLN ophthalmic solution, Place 1 drop into the right eye at bedtime., Disp: , Rfl:  .  traZODone (DESYREL) 50 MG tablet, Take 0.5-1 tablets (25-50 mg total) by mouth at bedtime as needed for sleep., Disp: 30 tablet, Rfl: 1 .  triamcinolone cream (KENALOG) 0.1 %, Apply 1 application topically 2 (two) times daily as needed., Disp: 30 g, Rfl: 0 .  valACYclovir (VALTREX) 500 MG tablet, Take 1 tablet by mouth 2 (two) times daily., Disp: , Rfl: 0 .  BLACK COHOSH EXTRACT PO, Take by mouth., Disp: , Rfl:   Allergies  Allergen Reactions  . Bactrim [Sulfamethoxazole-Trimethoprim] Nausea Only  . Penicillins Nausea Only    I personally reviewed active problem list, medication list, allergies, notes from last encounter, lab results with the  patient/caregiver today.  ROS  Ten systems reviewed and is negative except as mentioned in HPI  Objective  Virtual encounter, vitals not obtained.  There is no height or weight on file to calculate BMI.  Nursing Note and Vital Signs reviewed.  Physical Exam  Constitutional: Patient appears well-developed and well-nourished. No distress.  HENT: Head: Normocephalic and atraumatic.  Neck: Normal range of motion. Pulmonary/Chest: Effort normal. No respiratory distress. Speaking in complete sentences Neurological: Pt is alert and oriented to person, place, and time. Coordination, speech are normal.  Psychiatric: Patient has a normal mood and affect. behavior is normal. Judgment and thought content normal.  No results found for this or any previous visit (from the past 72 hour(s)).  Assessment & Plan  1. Moderate persistent asthma with acute exacerbation - Will hold on antibiotic treatment at this time, prednisone, breo, and albuterol as prescribed - predniSONE (DELTASONE) 10 MG tablet; Day1:5tabs, Day2:4tabs, Day3:3tabs, Day4:2tabs, Day5:1tab  Dispense: 15 tablet; Refill: 0 - triamcinolone (NASACORT)  55 MCG/ACT AERO nasal inhaler; Place 2 sprays into the nose daily.  Dispense: 1 Inhaler; Refill: 12  2. Non-seasonal allergic rhinitis, unspecified trigger - levocetirizine (XYZAL) 5 MG tablet; Take 1 tablet (5 mg total) by mouth every evening.  Dispense: 30 tablet; Refill: 2 - triamcinolone (NASACORT) 55 MCG/ACT AERO nasal inhaler; Place 2 sprays into the nose daily.  Dispense: 1 Inhaler; Refill: 12 - sodium chloride (OCEAN) 0.65 % SOLN nasal spray; Place 1 spray into both nostrils as needed for congestion.  Dispense: 50 mL; Refill: 1 - I suspect allergic reaction to new environment when she switched departments temporarily, do not suspect bacterial sinusitis at this time, advised to monitor closely for worsening/lack of improvement.  Sleep with humidifier on in the room, medications as  above.  -Red flags and when to present for emergency care or RTC including fever >101.17F, chest pain, shortness of breath, new/worsening/un-resolving symptoms, reviewed with patient at time of visit. Follow up and care instructions discussed and provided in AVS. - I discussed the assessment and treatment plan with the patient. The patient was provided an opportunity to ask questions and all were answered. The patient agreed with the plan and demonstrated an understanding of the instructions.  I provided 12 minutes of non-face-to-face time during this encounter.  Hubbard Hartshorn, FNP

## 2020-01-06 MED ORDER — FLUCONAZOLE 150 MG PO TABS: 150.0000 mg | ORAL_TABLET | Freq: Once | ORAL | 0 refills | Status: AC

## 2020-01-15 DIAGNOSIS — Z947 Corneal transplant status: Secondary | ICD-10-CM | POA: Diagnosis not present

## 2020-01-15 DIAGNOSIS — Z8669 Personal history of other diseases of the nervous system and sense organs: Secondary | ICD-10-CM | POA: Diagnosis not present

## 2020-01-15 DIAGNOSIS — H2511 Age-related nuclear cataract, right eye: Secondary | ICD-10-CM | POA: Diagnosis not present

## 2020-01-15 DIAGNOSIS — H179 Unspecified corneal scar and opacity: Secondary | ICD-10-CM | POA: Diagnosis not present

## 2020-01-24 ENCOUNTER — Ambulatory Visit (INDEPENDENT_AMBULATORY_CARE_PROVIDER_SITE_OTHER): Payer: BC Managed Care – PPO

## 2020-01-24 DIAGNOSIS — E538 Deficiency of other specified B group vitamins: Secondary | ICD-10-CM

## 2020-01-24 MED ORDER — CYANOCOBALAMIN 1000 MCG/ML IJ SOLN
1000.0000 ug | Freq: Once | INTRAMUSCULAR | Status: AC
  Administered 2020-01-24: 16:00:00 1000 ug via INTRAMUSCULAR

## 2020-01-24 NOTE — Progress Notes (Signed)
Patient came in for her B-12 injection. She tolerated it well. NKDA.   

## 2020-02-13 ENCOUNTER — Other Ambulatory Visit: Payer: Self-pay | Admitting: Family Medicine

## 2020-02-13 ENCOUNTER — Encounter: Payer: Self-pay | Admitting: Family Medicine

## 2020-02-13 DIAGNOSIS — K219 Gastro-esophageal reflux disease without esophagitis: Secondary | ICD-10-CM

## 2020-02-13 DIAGNOSIS — K5909 Other constipation: Secondary | ICD-10-CM

## 2020-02-13 DIAGNOSIS — R1013 Epigastric pain: Secondary | ICD-10-CM

## 2020-02-13 MED ORDER — PANTOPRAZOLE SODIUM 40 MG PO TBEC: 40.0000 mg | DELAYED_RELEASE_TABLET | Freq: Two times a day (BID) | ORAL | 0 refills | Status: DC

## 2020-03-06 ENCOUNTER — Encounter: Payer: BC Managed Care – PPO | Admitting: Obstetrics and Gynecology

## 2020-03-06 ENCOUNTER — Ambulatory Visit (INDEPENDENT_AMBULATORY_CARE_PROVIDER_SITE_OTHER): Payer: BC Managed Care – PPO

## 2020-03-06 ENCOUNTER — Ambulatory Visit: Payer: BC Managed Care – PPO

## 2020-03-06 ENCOUNTER — Other Ambulatory Visit: Payer: Self-pay

## 2020-03-06 DIAGNOSIS — E538 Deficiency of other specified B group vitamins: Secondary | ICD-10-CM | POA: Diagnosis not present

## 2020-03-06 MED ORDER — CYANOCOBALAMIN 1000 MCG/ML IJ SOLN
1000.0000 ug | Freq: Once | INTRAMUSCULAR | Status: AC
  Administered 2020-03-06: 1000 ug via INTRAMUSCULAR

## 2020-03-06 NOTE — Progress Notes (Signed)
Patient came in for her B-12 injection. She tolerated it well. NKDA.   

## 2020-03-10 ENCOUNTER — Encounter: Payer: BC Managed Care – PPO | Admitting: Obstetrics and Gynecology

## 2020-03-12 ENCOUNTER — Encounter: Payer: BC Managed Care – PPO | Admitting: Obstetrics and Gynecology

## 2020-03-26 ENCOUNTER — Ambulatory Visit: Payer: BC Managed Care – PPO | Admitting: Family Medicine

## 2020-03-27 ENCOUNTER — Ambulatory Visit: Payer: BC Managed Care – PPO | Admitting: Family Medicine

## 2020-04-07 ENCOUNTER — Ambulatory Visit: Payer: BC Managed Care – PPO | Admitting: Family Medicine

## 2020-04-07 ENCOUNTER — Ambulatory Visit: Payer: BC Managed Care – PPO

## 2020-04-08 ENCOUNTER — Other Ambulatory Visit: Payer: Self-pay | Admitting: Obstetrics and Gynecology

## 2020-04-08 DIAGNOSIS — Z79890 Hormone replacement therapy (postmenopausal): Secondary | ICD-10-CM

## 2020-04-08 DIAGNOSIS — N951 Menopausal and female climacteric states: Secondary | ICD-10-CM

## 2020-04-14 ENCOUNTER — Encounter: Payer: BC Managed Care – PPO | Admitting: Obstetrics and Gynecology

## 2020-04-15 ENCOUNTER — Ambulatory Visit: Payer: BC Managed Care – PPO | Admitting: Family Medicine

## 2020-04-27 ENCOUNTER — Encounter: Payer: Self-pay | Admitting: Family Medicine

## 2020-04-27 ENCOUNTER — Ambulatory Visit: Payer: BC Managed Care – PPO | Admitting: Family Medicine

## 2020-04-27 ENCOUNTER — Other Ambulatory Visit: Payer: Self-pay

## 2020-04-27 VITALS — BP 100/68 | HR 70 | Temp 98.5°F | Resp 16 | Ht 65.0 in | Wt 130.0 lb

## 2020-04-27 DIAGNOSIS — F341 Dysthymic disorder: Secondary | ICD-10-CM

## 2020-04-27 DIAGNOSIS — K219 Gastro-esophageal reflux disease without esophagitis: Secondary | ICD-10-CM

## 2020-04-27 DIAGNOSIS — E538 Deficiency of other specified B group vitamins: Secondary | ICD-10-CM

## 2020-04-27 DIAGNOSIS — F5104 Psychophysiologic insomnia: Secondary | ICD-10-CM

## 2020-04-27 DIAGNOSIS — J4540 Moderate persistent asthma, uncomplicated: Secondary | ICD-10-CM

## 2020-04-27 DIAGNOSIS — F419 Anxiety disorder, unspecified: Secondary | ICD-10-CM | POA: Diagnosis not present

## 2020-04-27 DIAGNOSIS — J3089 Other allergic rhinitis: Secondary | ICD-10-CM

## 2020-04-27 DIAGNOSIS — R1013 Epigastric pain: Secondary | ICD-10-CM

## 2020-04-27 DIAGNOSIS — K5909 Other constipation: Secondary | ICD-10-CM

## 2020-04-27 DIAGNOSIS — J302 Other seasonal allergic rhinitis: Secondary | ICD-10-CM

## 2020-04-27 MED ORDER — PANTOPRAZOLE SODIUM 40 MG PO TBEC: 40.0000 mg | DELAYED_RELEASE_TABLET | Freq: Two times a day (BID) | ORAL | 0 refills | Status: DC

## 2020-04-27 MED ORDER — TRAZODONE HCL 50 MG PO TABS: 25.0000 mg | ORAL_TABLET | Freq: Every evening | ORAL | 1 refills | Status: DC | PRN

## 2020-04-27 MED ORDER — BREO ELLIPTA 100-25 MCG/INH IN AEPB: INHALATION_SPRAY | RESPIRATORY_TRACT | 5 refills | Status: DC

## 2020-04-27 MED ORDER — LEVOCETIRIZINE DIHYDROCHLORIDE 5 MG PO TABS: 5.0000 mg | ORAL_TABLET | Freq: Every evening | ORAL | 1 refills | Status: DC

## 2020-04-27 MED ORDER — ALPRAZOLAM 0.5 MG PO TABS: 0.5000 mg | ORAL_TABLET | Freq: Two times a day (BID) | ORAL | 0 refills | Status: DC | PRN

## 2020-04-27 MED ORDER — CYANOCOBALAMIN 1000 MCG/ML IJ SOLN
1000.0000 ug | Freq: Once | INTRAMUSCULAR | Status: AC
  Administered 2020-04-27: 1000 ug via INTRAMUSCULAR

## 2020-04-27 NOTE — Progress Notes (Signed)
Name: Jessica Carroll   MRN: [id number]    DOB: 1971/11/22   Date:04/27/2020       Progress Note  Subjective  Chief Complaint  Chief Complaint  Patient presents with  . Follow-up  . B12 defficencey    HPI  Depression/anxiety/Insomnia: phq 9 is still positive at 5 today, she feels anxious all the time, always on edge, also has difficulty staying asleep, mind wonders and worries about everything, hydroxyzine helps her fall asleep but cannot stay asleep.She is taking yoga classes on weekends and seems to help with stress. No longer depressed. Last week was very stressful with her mother having to go back for a repeat mammography and she had a panic attacks and took a half dose of xanax.   Chronic constipation: taking Trulance daily and works well for her, no straining, blood in stools. Bristol scale of 4 most of the time    Asthma moderated: had COVID-19 in October 2020  Shortly after she noticed SOB with walking up a hill, discussed importance of using rescue inhaler, she states still not back to normal but improving. She is active at work and no problems when indoors. She still has intermittent lack of taste and smell  GERD:  She is now on Protonix BID , dexilant stopped working. No heartburn or indigestion at this time.   Iron deficiency and B12 deficiency: Symptoms improved since she got iron infusion.Last ferritin was high and she has been off iron supplementation, B12 was low and she is getting B12 monthly   Migraine: she is doing well, no recent episodes of migraines, just pressure headache associated with allergies. Using nasal spray. Pain is not daily.   Insomnia: schedule at work has improved down from 6 days a week to 4 days a week. She is now on Trazodone and is able to fall and stay asleep.     Patient Active Problem List   Diagnosis Date Noted  . Coronavirus infection 09/25/2019  . Moderate episode of recurrent major depressive disorder ([place]) 12/20/2018  . Iron  deficiency anemia 12/19/2018  . Type A blood, Rh positive 08/22/2017  . S/P vaginal hysterectomy 07/31/2017  . Recurrent vaginitis 01/10/2017  . Hemorrhoids, external without complications 0000000  . Nausea in adult patient 02/24/2016  . Endometriosis determined by laparoscopy 01/25/2016  . Chronic constipation 08/14/2015  . Monilial vaginitis 08/14/2015  . Bilateral ganglion cysts of wrists 08/14/2015  . Mild mitral regurgitation 05/19/2015  . B12 deficiency 05/18/2015  . Asthma, moderate 05/18/2015  . Anxiety 05/18/2015  . Insomnia, persistent 05/18/2015  . Eczema of hand 05/18/2015  . Viral keratitis 05/18/2015  . Migraine without aura and without status migrainosus, not intractable 05/18/2015  . Numerous moles 05/18/2015  . Allergic rhinitis 05/18/2015  . History of corneal transplant 05/18/2015  . Gastroesophageal reflux disease without esophagitis 12/16/2009    Past Surgical History:  Procedure Laterality Date  . ABDOMINAL HYSTERECTOMY    . COLONOSCOPY WITH PROPOFOL N/A 09/29/2017   Procedure: COLONOSCOPY WITH PROPOFOL;  Surgeon: Jonathon Bellows, MD;  Location: [facility] ENDOSCOPY;  Service: Gastroenterology;  Laterality: N/A;  . CORNEAL TRANSPLANT Right 01/07/2013  . ESOPHAGOGASTRODUODENOSCOPY (EGD) WITH PROPOFOL N/A 09/29/2017   Procedure: ESOPHAGOGASTRODUODENOSCOPY (EGD) WITH PROPOFOL;  Surgeon: Jonathon Bellows, MD;  Location: [facility] ENDOSCOPY;  Service: Gastroenterology;  Laterality: N/A;  . EYE SURGERY    . eyelid surgery Right June 19, 2015   [facility] by Dr. Norlene Duel  . LAPAROSCOPIC BILATERAL SALPINGECTOMY N/A 01/25/2016   Procedure: LAPAROSCOPIC  BILATERAL SALPINGECTOMY, ADHESIOLYSIS, LAPAROSCOPIC EXCISION/FULGERATION ENDOMETRIOSIS ;  Surgeon: [name], MD;  Location: [facility] ORS;  Service: Gynecology;  Laterality: N/A;  . Laparoscopic excision and fulguration of endometriosis N/A    bladder flap and uterosacral ligament endometriosis, excised and cauterized  . UPPER  GI ENDOSCOPY  07/19/2012  . VAGINAL HYSTERECTOMY N/A 07/31/2017   Procedure: HYSTERECTOMY VAGINAL;  Surgeon: [name], MD;  Location: [facility] ORS;  Service: Gynecology;  Laterality: N/A;    Family History  Adopted: Yes  Problem Relation Age of Onset  . Hyperlipidemia Mother   . Hypertension Mother   . Diabetes Father   . Heart failure Father 17       hear attack  . Cancer Neg Hx   . Breast cancer Neg Hx     Social History   Tobacco Use  . Smoking status: Never Smoker  . Smokeless tobacco: Never Used  Substance Use Topics  . Alcohol use: Not Currently    Alcohol/week: 0.0 standard drinks    Comment: occ. wine     Current Outpatient Medications:  .  albuterol (PROVENTIL HFA) 108 (90 Base) MCG/ACT inhaler, Inhale 2 puffs into the lungs every 6 (six) hours as needed for wheezing or shortness of breath., Disp: 1 Inhaler, Rfl: 2 .  ALPRAZolam (XANAX) 0.5 MG tablet, Take 1 tablet (0.5 mg total) by mouth 2 (two) times daily as needed for anxiety., Disp: 10 tablet, Rfl: 0 .  baclofen (LIORESAL) 10 MG tablet, Take 1 tablet (10 mg total) by mouth 3 (three) times daily., Disp: 30 each, Rfl: 0 .  BLACK COHOSH EXTRACT PO, Take by mouth., Disp: , Rfl:  .  carboxymethylcellul-glycerin (REFRESH OPTIVE) 0.5-0.9 % ophthalmic solution, Frequency:PHARMDIR   Dosage:0.0     Instructions:  Note:6 x OU Dose: 0.5%-0.9%, Disp: , Rfl:  .  escitalopram (LEXAPRO) 5 MG tablet, Take 1 tablet (5 mg total) by mouth daily., Disp: 30 tablet, Rfl: 1 .  estradiol (ESTRACE) 1 MG tablet, TAKE 1 TABLET BY MOUTH EVERY DAY, Disp: 90 tablet, Rfl: 1 .  fluticasone furoate-vilanterol (BREO ELLIPTA) 100-25 MCG/INH AEPB, INHALE 1 PUFF INTO THE LUNGS EVERY DAY, Disp: 60 each, Rfl: 5 .  hydrOXYzine (ATARAX/VISTARIL) 10 MG tablet, Take 1 tablet (10 mg total) by mouth 3 (three) times daily as needed., Disp: 90 tablet, Rfl: 1 .  ibuprofen (ADVIL) 800 MG tablet, Take 1 tablet (800 mg total) by mouth every 8 (eight) hours  as needed., Disp: 30 tablet, Rfl: 1 .  levocetirizine (XYZAL) 5 MG tablet, Take 1 tablet (5 mg total) by mouth every evening., Disp: 30 tablet, Rfl: 2 .  MULTIPLE VITAMIN PO, Take 1 tablet by mouth daily., Disp: , Rfl:  .  pantoprazole (PROTONIX) 40 MG tablet, Take 1 tablet (40 mg total) by mouth 2 (two) times daily before a meal., Disp: 180 tablet, Rfl: 0 .  Plecanatide (TRULANCE) 3 MG TABS, Take 1 tablet by mouth daily., Disp: 90 tablet, Rfl: 1 .  prednisoLONE acetate (PRED FORTE) 1 % ophthalmic suspension, Place 1 drop into the right eye daily. , Disp: , Rfl:  .  promethazine (PHENERGAN) 12.5 MG tablet, Take 1 tablet (12.5 mg total) by mouth every 8 (eight) hours as needed for nausea or vomiting., Disp: 20 tablet, Rfl: 0 .  rizatriptan (MAXALT) 10 MG tablet, Take 1 tablet (10 mg total) by mouth 2 (two) times daily as needed., Disp: 10 tablet, Rfl: 0 .  sodium chloride (OCEAN) 0.65 % SOLN nasal spray, Place  1 spray into both nostrils as needed for congestion., Disp: 50 mL, Rfl: 1 .  traZODone (DESYREL) 50 MG tablet, Take 0.5-1 tablets (25-50 mg total) by mouth at bedtime as needed for sleep., Disp: 30 tablet, Rfl: 1 .  triamcinolone (NASACORT) 55 MCG/ACT AERO nasal inhaler, Place 2 sprays into the nose daily., Disp: 1 Inhaler, Rfl: 12 .  triamcinolone cream (KENALOG) 0.1 %, Apply 1 application topically 2 (two) times daily as needed., Disp: 30 g, Rfl: 0 .  valACYclovir (VALTREX) 500 MG tablet, Take 1 tablet by mouth 2 (two) times daily., Disp: , Rfl: 0  Current Facility-Administered Medications:  .  cyanocobalamin ((VITAMIN B-12)) injection 1,000 mcg, 1,000 mcg, Intramuscular, Once, Steele Sizer, MD  Allergies  Allergen Reactions  . Bactrim [Sulfamethoxazole-Trimethoprim] Nausea Only  . Penicillins Nausea Only    I personally reviewed active problem list, medication list, allergies, family history, social history, health maintenance with the patient/caregiver  today.   ROS  Constitutional: Negative for fever or weight change.  Respiratory: Negative for cough and shortness of breath.   Cardiovascular: Negative for chest pain or palpitations.  Gastrointestinal: Negative for abdominal pain, no bowel changes.  Musculoskeletal: Negative for gait problem or joint swelling.  Skin: Negative for rash.  Neurological: Negative for dizziness , no recent episodes of headache.  No other specific complaints in a complete review of systems (except as listed in HPI above).  Objective  Vitals:   04/27/20 1409  BP: 100/68  Pulse: 70  Resp: 16  Temp: 98.5 F (36.9 C)  TempSrc: Temporal  SpO2: 99%  Weight: 130 lb (59 kg)  Height: 5\' 5"  (1.651 m)    Body mass index is 21.63 kg/m.  Physical Exam  Constitutional: Patient appears well-developed and well-nourished.  No distress.  HEENT: head atraumatic, normocephalic, pupils equal and reactive to lightwithin normal limits Cardiovascular: Normal rate, regular rhythm and normal heart sounds.  No murmur heard. No BLE edema. Pulmonary/Chest: Effort normal and breath sounds normal. No respiratory distress. Abdominal: Soft.  There is no tenderness. Psychiatric: Patient has a normal mood and affect. behavior is normal. Judgment and thought content normal.  PHQ2/9: Depression screen [facility] 2/9 04/27/2020 01/03/2020 12/24/2019 11/20/2019 11/13/2019  Decreased Interest 0 0 0 0 0  Down, Depressed, Hopeless 0 0 1 0 0  PHQ - 2 Score 0 0 1 0 0  Altered sleeping 3 0 2 0 0  Tired, decreased energy 3 0 2 0 0  Change in appetite 0 0 0 0 0  Feeling bad or failure about yourself  0 0 0 0 0  Trouble concentrating 0 0 0 0 0  Moving slowly or fidgety/restless 0 0 0 0 0  Suicidal thoughts 0 0 0 0 0  PHQ-9 Score 6 0 5 0 0  Difficult doing work/chores Not difficult at all Not difficult at all Not difficult at all Not difficult at all -  Some recent data might be hidden    phq 9 is negative   Fall Risk: Fall Risk   04/27/2020 01/03/2020 12/24/2019 11/20/2019 10/11/2019  Falls in the past year? 0 0 0 0 0  Number falls in past yr: 0 0 0 0 0  Injury with Fall? 0 0 0 0 0  Risk for fall due to : - - - - -  Follow up - Falls evaluation completed - - -     Functional Status Survey: Is the patient deaf or have difficulty hearing?: No Does the patient have difficulty  seeing, even when wearing glasses/contacts?: No Does the patient have difficulty concentrating, remembering, or making decisions?: No Does the patient have difficulty walking or climbing stairs?: No Does the patient have difficulty dressing or bathing?: No Does the patient have difficulty doing errands alone such as visiting a doctor's office or shopping?: No    Assessment & Plan  1. Moderate persistent asthma without complication  - fluticasone furoate-vilanterol (BREO ELLIPTA) 100-25 MCG/INH AEPB; INHALE 1 PUFF INTO THE LUNGS EVERY DAY  Dispense: 60 each; Refill: 5  2. Chronic constipation   3. B12 deficiency  - cyanocobalamin ((VITAMIN B-12)) injection 1,000 mcg  4. Anxiety  - ALPRAZolam (XANAX) 0.5 MG tablet; Take 1 tablet (0.5 mg total) by mouth 2 (two) times daily as needed for anxiety.  Dispense: 10 tablet; Refill: 0  5. Gastroesophageal reflux disease without esophagitis  - pantoprazole (PROTONIX) 40 MG tablet; Take 1 tablet (40 mg total) by mouth 2 (two) times daily before a meal.  Dispense: 180 tablet; Refill: 0  6. Dysthymia  She stopped lexapro because it caused lack of libido   7. Psychophysiological insomnia  - traZODone (DESYREL) 50 MG tablet; Take 0.5-1 tablets (25-50 mg total) by mouth at bedtime as needed for sleep.  Dispense: 90 tablet; Refill: 1  8. Epigastric pain  - pantoprazole (PROTONIX) 40 MG tablet; Take 1 tablet (40 mg total) by mouth 2 (two) times daily before a meal.  Dispense: 180 tablet; Refill: 0  9. Perennial  allergic rhinitis  - levocetirizine (XYZAL) 5 MG tablet; Take 1 tablet (5 mg  total) by mouth every evening.  Dispense: 90 tablet; Refill: 1

## 2020-05-29 DIAGNOSIS — M5489 Other dorsalgia: Secondary | ICD-10-CM | POA: Diagnosis not present

## 2020-06-03 ENCOUNTER — Encounter: Payer: Self-pay | Admitting: Obstetrics and Gynecology

## 2020-06-03 ENCOUNTER — Encounter: Payer: Self-pay | Admitting: Surgical

## 2020-06-03 ENCOUNTER — Other Ambulatory Visit: Payer: Self-pay

## 2020-06-03 ENCOUNTER — Ambulatory Visit: Payer: BC Managed Care – PPO | Admitting: Obstetrics and Gynecology

## 2020-06-03 VITALS — BP 117/70 | HR 69 | Ht 65.0 in | Wt 131.6 lb

## 2020-06-03 DIAGNOSIS — R300 Dysuria: Secondary | ICD-10-CM | POA: Diagnosis not present

## 2020-06-03 LAB — POCT URINALYSIS DIPSTICK OB
Bilirubin, UA: NEGATIVE
Blood, UA: NEGATIVE
Glucose, UA: NEGATIVE
Ketones, UA: NEGATIVE
Leukocytes, UA: NEGATIVE
Nitrite, UA: NEGATIVE
POC,PROTEIN,UA: NEGATIVE
Spec Grav, UA: 1.01 (ref 1.010–1.025)
Urobilinogen, UA: 0.2 E.U./dL
pH, UA: 7 (ref 5.0–8.0)

## 2020-06-03 NOTE — Progress Notes (Signed)
HPI:      [name] is a 49 y.o. G0P0000 who LMP was Patient's last menstrual period was 07/11/2017 (exact date).  Subjective:   She presents today complaining of urinary burning and discomfort Saturday and Sunday.  She says it is slightly better but she continues to experience vulvar burning. She reports no vaginal discharge. She did have back pain and did a televisit in which they prescribed steroids for her back pain. She has no new sexual partners.  Of significant note patient was begun on HRT approximately 6 months ago and did not follow-up for her 3 months check.  Today she states that she continues on the HRT and is very happy with it.  She is glad to be back on it.    Hx: The following portions of the patient's history were reviewed and updated as appropriate:             She  has a past medical history of Anxiety, Asthma (2011), Depression, Diffuse cystic mastopathy, Endometriosis, GERD (gastroesophageal reflux disease), and Headache. She does not have any pertinent problems on file. She  has a past surgical history that includes Upper gi endoscopy (07/19/2012); Corneal transplant (Right, 01/07/2013); eyelid surgery (Right, June 19, 2015); Laparoscopic excision and fulguration of endometriosis (N/A); Laparoscopic bilateral salpingectomy (N/A, 01/25/2016); Eye surgery; Vaginal hysterectomy (N/A, 07/31/2017); Colonoscopy with propofol (N/A, 09/29/2017); Esophagogastroduodenoscopy (egd) with propofol (N/A, 09/29/2017); and Abdominal hysterectomy. Her family history includes Diabetes in her father; Heart failure (age of onset: 47) in her father; Hyperlipidemia in her mother; Hypertension in her mother. She was adopted. She  reports that she has never smoked. She has never used smokeless tobacco. She reports previous alcohol use. She reports that she does not use drugs. She has a current medication list which includes the following prescription(s): albuterol, alprazolam, baclofen, black  cohosh, carboxymethylcellul-glycerin, estradiol, breo ellipta, ibuprofen, levocetirizine, multiple vitamin, pantoprazole, trulance, prednisolone acetate, promethazine, rizatriptan, sodium chloride, trazodone, triamcinolone, triamcinolone cream, and valacyclovir. She is allergic to bactrim [sulfamethoxazole-trimethoprim] and penicillins.       Review of Systems:  Review of Systems  Constitutional: Denied constitutional symptoms, night sweats, recent illness, fatigue, fever, insomnia and weight loss.  Eyes: Denied eye symptoms, eye pain, photophobia, vision change and visual disturbance.  Ears/Nose/Throat/Neck: Denied ear, nose, throat or neck symptoms, hearing loss, nasal discharge, sinus congestion and sore throat.  Cardiovascular: Denied cardiovascular symptoms, arrhythmia, chest pain/pressure, edema, exercise intolerance, orthopnea and palpitations.  Respiratory: Denied pulmonary symptoms, asthma, pleuritic pain, productive sputum, cough, dyspnea and wheezing.  Gastrointestinal: Denied, gastro-esophageal reflux, melena, nausea and vomiting.  Genitourinary: See HPI for additional information.  Musculoskeletal: Denied musculoskeletal symptoms, stiffness, swelling, muscle weakness and myalgia.  Dermatologic: Denied dermatology symptoms, rash and scar.  Neurologic: Denied neurology symptoms, dizziness, headache, neck pain and syncope.  Psychiatric: Denied psychiatric symptoms, anxiety and depression.  Endocrine: Denied endocrine symptoms including hot flashes and night sweats.   Meds:   Current Outpatient Medications on File Prior to Visit  Medication Sig Dispense Refill  . albuterol (PROVENTIL HFA) 108 (90 Base) MCG/ACT inhaler Inhale 2 puffs into the lungs every 6 (six) hours as needed for wheezing or shortness of breath. 1 Inhaler 2  . ALPRAZolam (XANAX) 0.5 MG tablet Take 1 tablet (0.5 mg total) by mouth 2 (two) times daily as needed for anxiety. 10 tablet 0  . baclofen (LIORESAL) 10 MG  tablet Take 1 tablet (10 mg total) by mouth 3 (three) times daily. 30 each 0  .  BLACK COHOSH EXTRACT PO Take by mouth.    . carboxymethylcellul-glycerin (REFRESH OPTIVE) 0.5-0.9 % ophthalmic solution Frequency:PHARMDIR   Dosage:0.0     Instructions:  Note:6 x OU Dose: 0.5%-0.9%    . estradiol (ESTRACE) 1 MG tablet TAKE 1 TABLET BY MOUTH EVERY DAY 90 tablet 1  . fluticasone furoate-vilanterol (BREO ELLIPTA) 100-25 MCG/INH AEPB INHALE 1 PUFF INTO THE LUNGS EVERY DAY 60 each 5  . ibuprofen (ADVIL) 800 MG tablet Take 1 tablet (800 mg total) by mouth every 8 (eight) hours as needed. 30 tablet 1  . levocetirizine (XYZAL) 5 MG tablet Take 1 tablet (5 mg total) by mouth every evening. 90 tablet 1  . MULTIPLE VITAMIN PO Take 1 tablet by mouth daily.    . pantoprazole (PROTONIX) 40 MG tablet Take 1 tablet (40 mg total) by mouth 2 (two) times daily before a meal. 180 tablet 0  . Plecanatide (TRULANCE) 3 MG TABS Take 1 tablet by mouth daily. 90 tablet 1  . prednisoLONE acetate (PRED FORTE) 1 % ophthalmic suspension Place 1 drop into the right eye daily.     . promethazine (PHENERGAN) 12.5 MG tablet Take 1 tablet (12.5 mg total) by mouth every 8 (eight) hours as needed for nausea or vomiting. 20 tablet 0  . rizatriptan (MAXALT) 10 MG tablet Take 1 tablet (10 mg total) by mouth 2 (two) times daily as needed. 10 tablet 0  . sodium chloride (OCEAN) 0.65 % SOLN nasal spray Place 1 spray into both nostrils as needed for congestion. 50 mL 1  . traZODone (DESYREL) 50 MG tablet Take 0.5-1 tablets (25-50 mg total) by mouth at bedtime as needed for sleep. 90 tablet 1  . triamcinolone (NASACORT) 55 MCG/ACT AERO nasal inhaler Place 2 sprays into the nose daily. 1 Inhaler 12  . triamcinolone cream (KENALOG) 0.1 % Apply 1 application topically 2 (two) times daily as needed. 30 g 0  . valACYclovir (VALTREX) 500 MG tablet Take 1 tablet by mouth 2 (two) times daily.  0   No current facility-administered medications on file  prior to visit.    Objective:     Vitals:   06/03/20 1035  BP: 117/70  Pulse: 69              Physical examination   Pelvic:   Vulva: Normal appearance.  No lesions.  Vagina: No lesions or abnormalities noted.  Support: Normal pelvic support.  Urethra No masses tenderness or scarring.  Meatus Normal size without lesions or prolapse.  Cervix:  Surgically absent  Anus: Normal exam.  No lesions.  Perineum: Normal exam.  No lesions.        Bimanual   Uterus:  Surgically absent  Adnexae: No masses.  Non-tender to palpation.  Cul-de-sac: Negative for abnormality.   UA -no evidence of UTI  WET PREP: clue cells: absent, KOH (yeast): negative, odor: absent and trichomoniasis: negative Ph:  < 4.5   Assessment:    G0P0000 Patient Active Problem List   Diagnosis Date Noted  . Coronavirus infection 09/25/2019  . Moderate episode of recurrent major depressive disorder ([place]) 12/20/2018  . Iron deficiency anemia 12/19/2018  . Type A blood, Rh positive 08/22/2017  . S/P vaginal hysterectomy 07/31/2017  . Recurrent vaginitis 01/10/2017  . Hemorrhoids, external without complications 10/11/5944  . Nausea in adult patient 02/24/2016  . Endometriosis determined by laparoscopy 01/25/2016  . Chronic constipation 08/14/2015  . Monilial vaginitis 08/14/2015  . Bilateral ganglion cysts of wrists 08/14/2015  .  Mild mitral regurgitation 05/19/2015  . B12 deficiency 05/18/2015  . Asthma, moderate 05/18/2015  . Anxiety 05/18/2015  . Insomnia, persistent 05/18/2015  . Eczema of hand 05/18/2015  . Viral keratitis 05/18/2015  . Migraine without aura and without status migrainosus, not intractable 05/18/2015  . Numerous moles 05/18/2015  . Allergic rhinitis 05/18/2015  . History of corneal transplant 05/18/2015  . Gastroesophageal reflux disease without esophagitis 12/16/2009     1. Dysuria     Also associated vaginal burning.  Wet prep negative  Urine dip negative   Plan:             1.  Urine for culture and sensitivity as patient tells a good story for possibility of UTI.  2.  Expectant management of dysuria.  Expect resolution if C&S negative  3.  At patient request discussed with the use of probiotics and possible over-the-counter medications for "yeast infection.  Questions answered. Orders Orders Placed This Encounter  Procedures  . POC Urinalysis Dipstick OB    No orders of the defined types were placed in this encounter.     F/U  No follow-ups on file. I spent 22 minutes involved in the care of this patient preparing to see the patient by obtaining and reviewing her medical history (including labs, imaging tests and prior procedures), documenting clinical information in the electronic health record (EHR), counseling and coordinating care plans, writing and sending prescriptions, ordering tests or procedures and directly communicating with the patient by discussing pertinent items from her history and physical exam as well as detailing my assessment and plan as noted above so that she has an informed understanding.  All of her questions were answered.  Finis Bud, M.D. 06/03/2020 11:09 AM

## 2020-06-03 NOTE — Addendum Note (Signed)
Addended by: Durwin Glaze on: 06/03/2020 11:38 AM   Modules accepted: Orders

## 2020-06-05 LAB — URINE CULTURE: Organism ID, Bacteria: NO GROWTH

## 2020-06-23 ENCOUNTER — Telehealth: Payer: Self-pay | Admitting: Obstetrics and Gynecology

## 2020-06-23 DIAGNOSIS — N951 Menopausal and female climacteric states: Secondary | ICD-10-CM

## 2020-06-23 DIAGNOSIS — Z79890 Hormone replacement therapy (postmenopausal): Secondary | ICD-10-CM

## 2020-06-23 NOTE — Telephone Encounter (Signed)
Please advise 

## 2020-06-23 NOTE — Telephone Encounter (Signed)
Pt called in and stated that she is having hot flashes with the estradiol. The pt was requesting an increase in dose. The pt uses [facility]. Please advise

## 2020-06-24 ENCOUNTER — Other Ambulatory Visit: Payer: Self-pay

## 2020-06-24 ENCOUNTER — Ambulatory Visit (INDEPENDENT_AMBULATORY_CARE_PROVIDER_SITE_OTHER): Payer: BC Managed Care – PPO

## 2020-06-24 DIAGNOSIS — E538 Deficiency of other specified B group vitamins: Secondary | ICD-10-CM

## 2020-06-24 MED ORDER — ESTRADIOL 1 MG PO TABS: 1.5000 mg | ORAL_TABLET | Freq: Every day | ORAL | 1 refills | Status: DC

## 2020-06-24 MED ORDER — CYANOCOBALAMIN 1000 MCG/ML IJ SOLN
1000.0000 ug | Freq: Once | INTRAMUSCULAR | Status: AC
Start: 2020-06-24 — End: 2020-06-24
  Administered 2020-06-24: 1000 ug via INTRAMUSCULAR

## 2020-06-24 NOTE — Telephone Encounter (Signed)
Sent prescription to the pharmacy Notified patient that prescription was at the pharmacy.

## 2020-08-06 NOTE — Progress Notes (Signed)
Name: Jessica Carroll   MRN: [id number]    DOB: 03-04-1971   Date:08/07/2020       Progress Note  Subjective  Chief Complaint  Chief Complaint  Patient presents with  . Asthma  . Gastroesophageal Reflux  . Migraine  . B12 Injection  . Anxiety  . Sinusitis    HPI  Sinusitis This is a new problem. The current episode started in the past 3 days. The problem is unchanged. There has been no fever. The pain is moderate. (Nasal congestion, facial pain and swelling) Treatments tried: Benadryl and Nyquil. The treatment provided no relief. Discussed likely viral or AR, use nasal spray daily, resume anti-histamine since xyzal not covered by insurance, try loratadine, also can try saline spray   Depression/anxiety/Insomnia: phq 9 is still positive at 6 today, she feels anxious all the time, always on edge, also has difficulty staying asleep, mind wonders and worries about everything,, symptoms are getting worse since her step son has been spending weekends with them and is a 50 yo that does not clean up after, causing conflict in her relationship, taking BZD half pill on weekends to be able to stay calm.   Chronic constipation:she stopped trulance because of cost, taking miralax otc and is controlling symptoms, bowel movements daily, bristol scale of 4  Asthma moderated: had COVID-19 in October 2020 , she takes [place] daily and no wheezing, cough or sob  GERD:  She is now on Protonix BID , dexilant stopped working. No heartburn or indigestion at this time.   Iron deficiency and B12 deficiency:Symptoms improved since she got iron infusion.Last ferritin was high and she has been off iron supplementation, B12 was low and she is getting B12 monthly , we will recheck levels next visit during CPE  Migraine: she is doing well, no recent episodes of migraines, she states episodes are sporadic but recently getting worse because of increase in stress, about once a week now, needs refill of Maxalt    Insomnia: schedule at work has improved down from 6 days a week to 4 days a week. She is now on Trazodone and is able to fall and stay asleep. Unchanged    Patient Active Problem List   Diagnosis Date Noted  . Coronavirus infection 09/25/2019  . Moderate episode of recurrent major depressive disorder ([place]) 12/20/2018  . Iron deficiency anemia 12/19/2018  . Type A blood, Rh positive 08/22/2017  . S/P vaginal hysterectomy 07/31/2017  . Recurrent vaginitis 01/10/2017  . Hemorrhoids, external without complications 44/02/4741  . Nausea in adult patient 02/24/2016  . Endometriosis determined by laparoscopy 01/25/2016  . Chronic constipation 08/14/2015  . Monilial vaginitis 08/14/2015  . Bilateral ganglion cysts of wrists 08/14/2015  . Mild mitral regurgitation 05/19/2015  . B12 deficiency 05/18/2015  . Asthma, moderate 05/18/2015  . Anxiety 05/18/2015  . Insomnia, persistent 05/18/2015  . Eczema of hand 05/18/2015  . Viral keratitis 05/18/2015  . Migraine without aura and without status migrainosus, not intractable 05/18/2015  . Numerous moles 05/18/2015  . Allergic rhinitis 05/18/2015  . History of corneal transplant 05/18/2015  . Gastroesophageal reflux disease without esophagitis 12/16/2009    Past Surgical History:  Procedure Laterality Date  . ABDOMINAL HYSTERECTOMY    . COLONOSCOPY WITH PROPOFOL N/A 09/29/2017   Procedure: COLONOSCOPY WITH PROPOFOL;  Surgeon: Jonathon Bellows, MD;  Location: [facility] ENDOSCOPY;  Service: Gastroenterology;  Laterality: N/A;  . CORNEAL TRANSPLANT Right 01/07/2013  . ESOPHAGOGASTRODUODENOSCOPY (EGD) WITH PROPOFOL N/A 09/29/2017   Procedure: ESOPHAGOGASTRODUODENOSCOPY (  EGD) WITH PROPOFOL;  Surgeon: Jonathon Bellows, MD;  Location: [facility] ENDOSCOPY;  Service: Gastroenterology;  Laterality: N/A;  . EYE SURGERY    . eyelid surgery Right June 19, 2015   [facility] by Dr. Norlene Duel  . LAPAROSCOPIC BILATERAL SALPINGECTOMY N/A 01/25/2016   Procedure:  LAPAROSCOPIC BILATERAL SALPINGECTOMY, ADHESIOLYSIS, LAPAROSCOPIC EXCISION/FULGERATION ENDOMETRIOSIS ;  Surgeon: [name], MD;  Location: [facility] ORS;  Service: Gynecology;  Laterality: N/A;  . Laparoscopic excision and fulguration of endometriosis N/A    bladder flap and uterosacral ligament endometriosis, excised and cauterized  . UPPER GI ENDOSCOPY  07/19/2012  . VAGINAL HYSTERECTOMY N/A 07/31/2017   Procedure: HYSTERECTOMY VAGINAL;  Surgeon: [name], MD;  Location: [facility] ORS;  Service: Gynecology;  Laterality: N/A;    Family History  Adopted: Yes  Problem Relation Age of Onset  . Hyperlipidemia Mother   . Hypertension Mother   . Diabetes Father   . Heart failure Father 15       hear attack  . Cancer Neg Hx   . Breast cancer Neg Hx     Social History   Tobacco Use  . Smoking status: Never Smoker  . Smokeless tobacco: Never Used  Substance Use Topics  . Alcohol use: Not Currently    Alcohol/week: 0.0 standard drinks    Comment: occ. wine     Current Outpatient Medications:  .  albuterol (PROVENTIL HFA) 108 (90 Base) MCG/ACT inhaler, Inhale 2 puffs into the lungs every 6 (six) hours as needed for wheezing or shortness of breath., Disp: 1 Inhaler, Rfl: 2 .  ALPRAZolam (XANAX) 0.5 MG tablet, Take 1 tablet (0.5 mg total) by mouth 2 (two) times daily as needed for anxiety., Disp: 10 tablet, Rfl: 0 .  baclofen (LIORESAL) 10 MG tablet, Take 1 tablet (10 mg total) by mouth 3 (three) times daily., Disp: 30 each, Rfl: 0 .  carboxymethylcellul-glycerin (REFRESH OPTIVE) 0.5-0.9 % ophthalmic solution, Frequency:PHARMDIR   Dosage:0.0     Instructions:  Note:6 x OU Dose: 0.5%-0.9%, Disp: , Rfl:  .  estradiol (ESTRACE) 1 MG tablet, Take 1.5 tablets (1.5 mg total) by mouth daily., Disp: 135 tablet, Rfl: 1 .  fluticasone furoate-vilanterol (BREO ELLIPTA) 100-25 MCG/INH AEPB, INHALE 1 PUFF INTO THE LUNGS EVERY DAY, Disp: 60 each, Rfl: 5 .  ibuprofen (ADVIL) 800 MG tablet,  Take 1 tablet (800 mg total) by mouth every 8 (eight) hours as needed., Disp: 30 tablet, Rfl: 1 .  MULTIPLE VITAMIN PO, Take 1 tablet by mouth daily., Disp: , Rfl:  .  pantoprazole (PROTONIX) 40 MG tablet, Take 1 tablet (40 mg total) by mouth 2 (two) times daily before a meal., Disp: 180 tablet, Rfl: 1 .  prednisoLONE acetate (PRED FORTE) 1 % ophthalmic suspension, Place 1 drop into the right eye daily. , Disp: , Rfl:  .  promethazine (PHENERGAN) 12.5 MG tablet, Take 1 tablet (12.5 mg total) by mouth every 8 (eight) hours as needed for nausea or vomiting., Disp: 20 tablet, Rfl: 0 .  rizatriptan (MAXALT) 10 MG tablet, Take 1 tablet (10 mg total) by mouth 2 (two) times daily as needed., Disp: 10 tablet, Rfl: 0 .  traZODone (DESYREL) 50 MG tablet, Take 0.5-1 tablets (25-50 mg total) by mouth at bedtime as needed for sleep., Disp: 90 tablet, Rfl: 1 .  triamcinolone (NASACORT) 55 MCG/ACT AERO nasal inhaler, Place 2 sprays into the nose daily., Disp: 1 Inhaler, Rfl: 12 .  triamcinolone cream (KENALOG) 0.1 %, Apply 1 application topically  2 (two) times daily as needed., Disp: 30 g, Rfl: 0 .  valACYclovir (VALTREX) 500 MG tablet, Take 1 tablet by mouth 2 (two) times daily., Disp: , Rfl: 0 .  BLACK COHOSH EXTRACT PO, Take by mouth. (Patient not taking: Reported on 08/07/2020), Disp: , Rfl:  .  loratadine (CLARITIN) 10 MG tablet, Take 1 tablet (10 mg total) by mouth daily., Disp: 30 tablet, Rfl: 11  Allergies  Allergen Reactions  . Bactrim [Sulfamethoxazole-Trimethoprim] Nausea Only  . Penicillins Nausea Only    I personally reviewed active problem list, medication list, allergies, family history, social history, health maintenance with the patient/caregiver today.   ROS  Constitutional: Negative for fever or weight change.  Respiratory: Negative for cough and shortness of breath.   Cardiovascular: Negative for chest pain or palpitations.  Gastrointestinal: Negative for abdominal pain, no bowel  changes.  Musculoskeletal: Negative for gait problem or joint swelling.  Skin: Negative for rash.  Neurological: Negative for dizziness or headache.  No other specific complaints in a complete review of systems (except as listed in HPI above).  Objective  Vitals:   08/07/20 1515  BP: 118/80  Pulse: 76  Resp: 16  Temp: 97.7 F (36.5 C)  TempSrc: Oral  SpO2: 100%  Weight: 130 lb 11.2 oz (59.3 kg)  Height: 5\' 5"  (1.651 m)    Body mass index is 21.75 kg/m.  Physical Exam  Constitutional: Patient appears well-developed and well-nourished. No distress.  HEENT: head atraumatic, normocephalic, pupils equal and reactive to light, neck supple, boggy turbinates - pale, no post-nasal drainage Cardiovascular: Normal rate, regular rhythm and normal heart sounds.  No murmur heard. No BLE edema. Pulmonary/Chest: Effort normal and breath sounds normal. No respiratory distress. Abdominal: Soft.  There is no tenderness. Psychiatric: Patient has a normal mood and affect. behavior is normal. Judgment and thought content normal.  Recent Results (from the past 2160 hour(s))  POC Urinalysis Dipstick OB     Status: None   Collection Time: 06/03/20 10:45 AM  Result Value Ref Range   Color, UA yellow    Clarity, UA clear    Glucose, UA Negative Negative   Bilirubin, UA neg    Ketones, UA neg    Spec Grav, UA 1.010 1.010 - 1.025   Blood, UA neg    pH, UA 7.0 5.0 - 8.0   POC,PROTEIN,UA Negative Negative, Trace, Small (1+), Moderate (2+), Large (3+), 4+   Urobilinogen, UA 0.2 0.2 or 1.0 E.U./dL   Nitrite, UA neg    Leukocytes, UA Negative Negative   Appearance     Odor    Urine Culture     Status: None   Collection Time: 06/03/20  3:39 PM   Specimen: Vein; Urine   UR  Result Value Ref Range   Urine Culture, Routine Final report    Organism ID, Bacteria No growth       PHQ2/9: Depression screen [facility] 2/9 08/07/2020 04/27/2020 01/03/2020 12/24/2019 11/20/2019  Decreased Interest 1 0 0 0 0   Down, Depressed, Hopeless 1 0 0 1 0  PHQ - 2 Score 2 0 0 1 0  Altered sleeping 1 3 0 2 0  Tired, decreased energy 1 3 0 2 0  Change in appetite 2 0 0 0 0  Feeling bad or failure about yourself  0 0 0 0 0  Trouble concentrating 0 0 0 0 0  Moving slowly or fidgety/restless 0 0 0 0 0  Suicidal thoughts 0 0 0 0  0  PHQ-9 Score 6 6 0 5 0  Difficult doing work/chores Not difficult at all Not difficult at all Not difficult at all Not difficult at all Not difficult at all  Some recent data might be hidden    phq 9 is positive   Fall Risk: Fall Risk  04/27/2020 01/03/2020 12/24/2019 11/20/2019 10/11/2019  Falls in the past year? 0 0 0 0 0  Number falls in past yr: 0 0 0 0 0  Injury with Fall? 0 0 0 0 0  Risk for fall due to : - - - - -  Follow up - Falls evaluation completed - - -      Assessment & Plan    1. B12 deficiency  - cyanocobalamin ((VITAMIN B-12)) injection 1,000 mcg  2. Migraine without aura and without status migrainosus, not intractable  - rizatriptan (MAXALT) 10 MG tablet; Take 1 tablet (10 mg total) by mouth 2 (two) times daily as needed.  Dispense: 10 tablet; Refill: 0  3. Gastroesophageal reflux disease without esophagitis  - pantoprazole (PROTONIX) 40 MG tablet; Take 1 tablet (40 mg total) by mouth 2 (two) times daily before a meal.  Dispense: 180 tablet; Refill: 1  4. Iron deficiency anemia, unspecified iron deficiency anemia type   5. Nausea  - promethazine (PHENERGAN) 12.5 MG tablet; Take 1 tablet (12.5 mg total) by mouth every 8 (eight) hours as needed for nausea or vomiting.  Dispense: 20 tablet; Refill: 0  6. Epigastric pain  - pantoprazole (PROTONIX) 40 MG tablet; Take 1 tablet (40 mg total) by mouth 2 (two) times daily before a meal.  Dispense: 180 tablet; Refill: 1  7. Anxiety  - ALPRAZolam (XANAX) 0.5 MG tablet; Take 1 tablet (0.5 mg total) by mouth 2 (two) times daily as needed for anxiety.  Dispense: 10 tablet; Refill: 0  8. Neck muscle  spasm  - baclofen (LIORESAL) 10 MG tablet; Take 1 tablet (10 mg total) by mouth 3 (three) times daily.  Dispense: 30 each; Refill: 0  9. Moderate persistent asthma without complication   10. Perennial allergic rhinitis with seasonal variation  - loratadine (CLARITIN) 10 MG tablet; Take 1 tablet (10 mg total) by mouth daily.  Dispense: 30 tablet; Refill: 11  11. Dysthymia   12. Chronic constipation   13. Sinus drainage

## 2020-08-06 NOTE — Patient Instructions (Signed)
Vitamin B12 Deficiency Vitamin B12 deficiency means that your body does not have enough vitamin B12. The body needs this vitamin:  To make red blood cells.  To make genes (DNA).  To help the nerves work. If you do not have enough vitamin B12 in your body, you can have health problems. What are the causes?  Not eating enough foods that contain vitamin B12.  Not being able to absorb vitamin B12 from the food that you eat.  Certain digestive system diseases.  A condition in which the body does not make enough of a certain protein, which results in too few red blood cells (pernicious anemia).  Having a surgery in which part of the stomach or small intestine is removed.  Taking medicines that make it hard for the body to absorb vitamin B12. These medicines include: ? Heartburn medicines. ? Some antibiotic medicines. ? Other medicines that are used to treat certain conditions. What increases the risk?  Being older than age 50.  Eating a vegetarian or vegan diet, especially while you are pregnant.  Eating a poor diet while you are pregnant.  Taking certain medicines.  Having alcoholism. What are the signs or symptoms? In some cases, there are no symptoms. If the condition leads to too few blood cells or nerve damage, symptoms can occur, such as:  Feeling weak.  Feeling tired (fatigued).  Not being hungry.  Weight loss.  A loss of feeling (numbness) or tingling in your hands and feet.  Redness and burning of the tongue.  Being mixed up (confused) or having memory problems.  Sadness (depression).  Problems with your senses. This can include color blindness, ringing in the ears, or loss of taste.  Watery poop (diarrhea) or trouble pooping (constipation).  Trouble walking. If anemia is very bad, symptoms can include:  Being short of breath.  Being dizzy.  Having a very fast heartbeat. How is this treated?  Changing the way you eat and drink, such  as: ? Eating more foods that contain vitamin B12. ? Drinking little or no alcohol.  Getting vitamin B12 shots.  Taking vitamin B12 supplements. Your doctor will tell you the dose that is best for you. Follow these instructions at home: Eating and drinking   Eat lots of healthy foods that contain vitamin B12. These include: ? Meats and poultry, such as beef, pork, chicken, turkey, and organ meats, such as liver. ? Seafood, such as clams, rainbow trout, salmon, tuna, and haddock. ? Eggs. ? Cereal and dairy products that have vitamin B12 added to them. Check the label. The items listed above may not be a complete list of what you can eat and drink. Contact a dietitian for more options. General instructions  Get any shots as told by your doctor.  Take supplements only as told by your doctor.  Do not drink alcohol if your doctor tells you not to. In some cases, you may only be asked to limit alcohol use.  Keep all follow-up visits as told by your doctor. This is important. Contact a doctor if:  Your symptoms come back. Get help right away if:  You have trouble breathing.  You have a very fast heartbeat.  You have chest pain.  You get dizzy.  You pass out. Summary  Vitamin B12 deficiency means that your body is not getting enough vitamin B12.  In some cases, there are no symptoms of this condition.  Treatment may include making a change in the way you eat and drink,   getting vitamin B12 shots, or taking supplements.  Eat lots of healthy foods that contain vitamin B12. This information is not intended to replace advice given to you by your health care provider. Make sure you discuss any questions you have with your health care provider. Document Revised: 08/07/2018 Document Reviewed: 08/07/2018 Elsevier Patient Education  2020 Elsevier Inc.  

## 2020-08-07 ENCOUNTER — Ambulatory Visit: Payer: BC Managed Care – PPO | Admitting: Family Medicine

## 2020-08-07 ENCOUNTER — Encounter: Payer: Self-pay | Admitting: Family Medicine

## 2020-08-07 ENCOUNTER — Other Ambulatory Visit: Payer: Self-pay

## 2020-08-07 VITALS — BP 118/80 | HR 76 | Temp 97.7°F | Resp 16 | Ht 65.0 in | Wt 130.7 lb

## 2020-08-07 DIAGNOSIS — J302 Other seasonal allergic rhinitis: Secondary | ICD-10-CM

## 2020-08-07 DIAGNOSIS — R1013 Epigastric pain: Secondary | ICD-10-CM

## 2020-08-07 DIAGNOSIS — F419 Anxiety disorder, unspecified: Secondary | ICD-10-CM

## 2020-08-07 DIAGNOSIS — J4540 Moderate persistent asthma, uncomplicated: Secondary | ICD-10-CM

## 2020-08-07 DIAGNOSIS — D509 Iron deficiency anemia, unspecified: Secondary | ICD-10-CM

## 2020-08-07 DIAGNOSIS — K5909 Other constipation: Secondary | ICD-10-CM

## 2020-08-07 DIAGNOSIS — G43009 Migraine without aura, not intractable, without status migrainosus: Secondary | ICD-10-CM

## 2020-08-07 DIAGNOSIS — F341 Dysthymic disorder: Secondary | ICD-10-CM

## 2020-08-07 DIAGNOSIS — R110 Nausea: Secondary | ICD-10-CM

## 2020-08-07 DIAGNOSIS — J3489 Other specified disorders of nose and nasal sinuses: Secondary | ICD-10-CM

## 2020-08-07 DIAGNOSIS — E538 Deficiency of other specified B group vitamins: Secondary | ICD-10-CM | POA: Diagnosis not present

## 2020-08-07 DIAGNOSIS — K219 Gastro-esophageal reflux disease without esophagitis: Secondary | ICD-10-CM

## 2020-08-07 DIAGNOSIS — M62838 Other muscle spasm: Secondary | ICD-10-CM

## 2020-08-07 DIAGNOSIS — J3089 Other allergic rhinitis: Secondary | ICD-10-CM

## 2020-08-07 MED ORDER — RIZATRIPTAN BENZOATE 10 MG PO TABS: 10.0000 mg | ORAL_TABLET | Freq: Two times a day (BID) | ORAL | 0 refills | Status: DC | PRN

## 2020-08-07 MED ORDER — ALPRAZOLAM 0.5 MG PO TABS: 0.5000 mg | ORAL_TABLET | Freq: Two times a day (BID) | ORAL | 0 refills | Status: DC | PRN

## 2020-08-07 MED ORDER — PROMETHAZINE HCL 12.5 MG PO TABS: 12.5000 mg | ORAL_TABLET | Freq: Three times a day (TID) | ORAL | 0 refills | Status: DC | PRN

## 2020-08-07 MED ORDER — CYANOCOBALAMIN 1000 MCG/ML IJ SOLN
1000.0000 ug | Freq: Once | INTRAMUSCULAR | Status: AC
  Administered 2020-08-07: 1000 ug via INTRAMUSCULAR

## 2020-08-07 MED ORDER — BACLOFEN 10 MG PO TABS: 10.0000 mg | ORAL_TABLET | Freq: Three times a day (TID) | ORAL | 0 refills | Status: DC

## 2020-08-07 MED ORDER — LORATADINE 10 MG PO TABS: 10.0000 mg | ORAL_TABLET | Freq: Every day | ORAL | 11 refills | Status: DC

## 2020-08-07 MED ORDER — PANTOPRAZOLE SODIUM 40 MG PO TBEC: 40.0000 mg | DELAYED_RELEASE_TABLET | Freq: Two times a day (BID) | ORAL | 1 refills | Status: DC

## 2020-09-11 ENCOUNTER — Ambulatory Visit (INDEPENDENT_AMBULATORY_CARE_PROVIDER_SITE_OTHER): Payer: BC Managed Care – PPO | Admitting: Emergency Medicine

## 2020-09-11 ENCOUNTER — Other Ambulatory Visit: Payer: Self-pay

## 2020-09-11 DIAGNOSIS — E538 Deficiency of other specified B group vitamins: Secondary | ICD-10-CM | POA: Diagnosis not present

## 2020-09-11 MED ORDER — CYANOCOBALAMIN 1000 MCG/ML IJ SOLN
1000.0000 ug | Freq: Once | INTRAMUSCULAR | Status: AC
  Administered 2020-09-11: 1000 ug via INTRAMUSCULAR

## 2020-09-29 ENCOUNTER — Other Ambulatory Visit: Payer: Self-pay | Admitting: Family Medicine

## 2020-09-29 DIAGNOSIS — G43009 Migraine without aura, not intractable, without status migrainosus: Secondary | ICD-10-CM

## 2020-09-29 MED ORDER — RIZATRIPTAN BENZOATE 10 MG PO TABS: 10.0000 mg | ORAL_TABLET | Freq: Two times a day (BID) | ORAL | 0 refills | Status: DC | PRN

## 2020-10-02 ENCOUNTER — Other Ambulatory Visit: Payer: Self-pay | Admitting: Family Medicine

## 2020-10-02 DIAGNOSIS — J4540 Moderate persistent asthma, uncomplicated: Secondary | ICD-10-CM

## 2020-10-02 MED ORDER — ALBUTEROL SULFATE HFA 108 (90 BASE) MCG/ACT IN AERS: 2.0000 | INHALATION_SPRAY | Freq: Four times a day (QID) | RESPIRATORY_TRACT | 0 refills | Status: DC | PRN

## 2020-10-02 NOTE — Telephone Encounter (Signed)
Pt is requesting a refill for albuterol (PROVENTIL HFA) 108 (90 Base) MCG/ACT inhaler     Pharmacy:  [place], [place] - [address] Phone:  [phone]  Fax:  [phone]

## 2020-10-02 NOTE — Telephone Encounter (Signed)
Requested medication (s) are due for refill today: yes  Requested medication (s) are on the active medication list: yes  Last refill:  03/20/2019  Future visit scheduled: yes  Notes to clinic:  insurance requesting alternative med    Requested Prescriptions  Pending Prescriptions Disp Refills   albuterol (PROVENTIL HFA) 108 (90 Base) MCG/ACT inhaler      Sig: Inhale 2 puffs into the lungs every 6 (six) hours as needed for wheezing or shortness of breath.      Pulmonology:  Beta Agonists Failed - 10/02/2020  2:07 PM      Failed - One inhaler should last at least one month. If the patient is requesting refills earlier, contact the patient to check for uncontrolled symptoms.      Passed - Valid encounter within last 12 months    Recent Outpatient Visits           1 month ago B12 deficiency   [facility], Drue Stager, MD   5 months ago Moderate persistent asthma without complication   [facility], Drue Stager, MD   9 months ago Moderate persistent asthma with acute exacerbation   [place], [place], FNP   9 months ago Well adult exam   [facility] Steele Sizer, MD   10 months ago Postpartum mastalgia   [facility] Delsa Grana, PA-C       Future Appointments             In 2 months Steele Sizer, MD [facility], [facility]

## 2020-10-12 ENCOUNTER — Other Ambulatory Visit: Payer: Self-pay

## 2020-10-12 ENCOUNTER — Ambulatory Visit (INDEPENDENT_AMBULATORY_CARE_PROVIDER_SITE_OTHER): Payer: BC Managed Care – PPO | Admitting: Gastroenterology

## 2020-10-12 VITALS — BP 115/72 | HR 66 | Temp 98.0°F | Ht 65.0 in | Wt 129.0 lb

## 2020-10-12 DIAGNOSIS — R140 Abdominal distension (gaseous): Secondary | ICD-10-CM

## 2020-10-12 DIAGNOSIS — K580 Irritable bowel syndrome with diarrhea: Secondary | ICD-10-CM

## 2020-10-12 MED ORDER — LUBIPROSTONE 8 MCG PO CAPS: 8.0000 ug | ORAL_CAPSULE | Freq: Two times a day (BID) | ORAL | 2 refills | Status: DC

## 2020-10-12 NOTE — Progress Notes (Signed)
Jonathon Bellows MD, MRCP(U.K) [address]  [place]  [place], [place] [address]  Main: [phone]  Fax: [phone]   Gastroenterology Consultation  Referring Provider:     Steele Sizer, MD Primary Care Physician:  Steele Sizer, MD Primary Gastroenterologist:  Dr. Jonathon Bellows  Reason for Consultation:     Abdominal pain        HPI:   Jessica Carroll is a 49 y.o. y/o female referred for consultation & management  by Dr. Ancil Boozer, Drue Stager, MD.    Summary of history :  She has previously been seen at my office back in October 2018 for nausea.  Also did have constipation which was managed with Linzess.  In 2018 ultrasound right upper quadrant demonstrated a benign cyst of the liver.  HIDA scan was normal.  She underwent a hysterectomy and found to have a leiomyoma.  At that point of time she had epigastric pain.  My impression was that her abdominal pain was due to combination of GERD, constipation as well as bloating.  Interval history   October 2018-10/12/2020  09/29/2017: EGD: Normal appearance.  Colonoscopy was normal except for internal hemorrhoids.  Biopsies of stomach showed features of mild chronic gastritis negative H. pylori.  09/01/2017 right upper quadrant ultrasound showed benign-appearing cyst in the left hepatic lobe  12/24/2019 hemoglobin 12.2 g, hepatitis C antibody negative, CMP normal except a mildly elevated total bilirubin.  She states that for the past few months she has noticed a lot of gas and bloating which usually develops an hour or 2 after a meal.  She has the appearance that she looks pregnant.  When she has a bowel movement associated with a lot of foul order.  He does not have a bowel movement every day.  Takes MiraLAX as needed.  She used to take Linzess which worked well but insurance stopped paying for it.  Consumes 2-3 flashes of Stevia per day.  No other artificial sugars or sweeteners.  No significant weight loss is unintentional.  No other  complaints.  Past Medical History:  Diagnosis Date   Anxiety    Asthma 2011   Depression    Diffuse cystic mastopathy    Endometriosis    GERD (gastroesophageal reflux disease)    Headache     Past Surgical History:  Procedure Laterality Date   ABDOMINAL HYSTERECTOMY     COLONOSCOPY WITH PROPOFOL N/A 09/29/2017   Procedure: COLONOSCOPY WITH PROPOFOL;  Surgeon: Jonathon Bellows, MD;  Location: [facility] ENDOSCOPY;  Service: Gastroenterology;  Laterality: N/A;   CORNEAL TRANSPLANT Right 01/07/2013   ESOPHAGOGASTRODUODENOSCOPY (EGD) WITH PROPOFOL N/A 09/29/2017   Procedure: ESOPHAGOGASTRODUODENOSCOPY (EGD) WITH PROPOFOL;  Surgeon: Jonathon Bellows, MD;  Location: [facility] ENDOSCOPY;  Service: Gastroenterology;  Laterality: N/A;   EYE SURGERY     eyelid surgery Right June 19, 2015   [facility] by Dr. Norlene Duel   LAPAROSCOPIC BILATERAL SALPINGECTOMY N/A 01/25/2016   Procedure: LAPAROSCOPIC BILATERAL SALPINGECTOMY, ADHESIOLYSIS, LAPAROSCOPIC EXCISION/FULGERATION ENDOMETRIOSIS ;  Surgeon: [name], MD;  Location: [facility] ORS;  Service: Gynecology;  Laterality: N/A;   Laparoscopic excision and fulguration of endometriosis N/A    bladder flap and uterosacral ligament endometriosis, excised and cauterized   UPPER GI ENDOSCOPY  07/19/2012   VAGINAL HYSTERECTOMY N/A 07/31/2017   Procedure: HYSTERECTOMY VAGINAL;  Surgeon: [name], MD;  Location: [facility] ORS;  Service: Gynecology;  Laterality: N/A;    Prior to Admission medications   Medication Sig Start Date End Date Taking? Authorizing  Provider  albuterol (PROVENTIL HFA) 108 (90 Base) MCG/ACT inhaler Inhale 2 puffs into the lungs every 6 (six) hours as needed for wheezing or shortness of breath. 10/02/20   Steele Sizer, MD  ALPRAZolam Duanne Moron) 0.5 MG tablet Take 1 tablet (0.5 mg total) by mouth 2 (two) times daily as needed for anxiety. 08/07/20   Steele Sizer, MD  baclofen (LIORESAL) 10 MG tablet Take 1 tablet (10 mg  total) by mouth 3 (three) times daily. 08/07/20   Steele Sizer, MD  BLACK COHOSH EXTRACT PO Take by mouth. Patient not taking: Reported on 08/07/2020    [provider]  carboxymethylcellul-glycerin (REFRESH OPTIVE) 0.5-0.9 % ophthalmic solution Frequency:PHARMDIR   Dosage:0.0     Instructions:  Note:6 x OU Dose: 0.5%-0.9% 10/01/12   [provider]  estradiol (ESTRACE) 1 MG tablet Take 1.5 tablets (1.5 mg total) by mouth daily. 06/24/20   Harlin Heys, MD  fluticasone furoate-vilanterol (BREO ELLIPTA) 100-25 MCG/INH AEPB INHALE 1 PUFF INTO THE LUNGS EVERY DAY 04/27/20   Steele Sizer, MD  ibuprofen (ADVIL) 800 MG tablet Take 1 tablet (800 mg total) by mouth every 8 (eight) hours as needed. 07/15/19   Steele Sizer, MD  loratadine (CLARITIN) 10 MG tablet Take 1 tablet (10 mg total) by mouth daily. 08/07/20   Steele Sizer, MD  MULTIPLE VITAMIN PO Take 1 tablet by mouth daily.    [provider]  pantoprazole (PROTONIX) 40 MG tablet Take 1 tablet (40 mg total) by mouth 2 (two) times daily before a meal. 08/07/20   Ancil Boozer, Drue Stager, MD  prednisoLONE acetate (PRED FORTE) 1 % ophthalmic suspension Place 1 drop into the right eye daily.  01/19/17   [provider]  promethazine (PHENERGAN) 12.5 MG tablet Take 1 tablet (12.5 mg total) by mouth every 8 (eight) hours as needed for nausea or vomiting. 08/07/20   Steele Sizer, MD  rizatriptan (MAXALT) 10 MG tablet Take 1 tablet (10 mg total) by mouth 2 (two) times daily as needed. 09/29/20   Steele Sizer, MD  traZODone (DESYREL) 50 MG tablet Take 0.5-1 tablets (25-50 mg total) by mouth at bedtime as needed for sleep. 04/27/20   Steele Sizer, MD  triamcinolone (NASACORT) 55 MCG/ACT AERO nasal inhaler Place 2 sprays into the nose daily. 01/03/20   Hubbard Hartshorn, FNP  triamcinolone cream (KENALOG) 0.1 % Apply 1 application topically 2 (two) times daily as needed. 01/09/17   Steele Sizer, MD  valACYclovir (VALTREX)  500 MG tablet Take 1 tablet by mouth 2 (two) times daily. 11/30/15   Dionne Bucy, MD    Family History  Adopted: Yes  Problem Relation Age of Onset   Hyperlipidemia Mother    Hypertension Mother    Diabetes Father    Heart failure Father 27       hear attack   Cancer Neg Hx    Breast cancer Neg Hx      Social History   Tobacco Use   Smoking status: Never Smoker   Smokeless tobacco: Never Used  Vaping Use   Vaping Use: Never used  Substance Use Topics   Alcohol use: Not Currently    Alcohol/week: 0.0 standard drinks    Comment: occ. wine   Drug use: No    Allergies as of 10/12/2020 - Review Complete 08/07/2020  Allergen Reaction Noted   Bactrim [sulfamethoxazole-trimethoprim] Nausea Only 09/25/2018   Penicillins Nausea Only 03/31/2015    Review of Systems:    All systems reviewed and negative  except where noted in HPI.   Physical Exam:  LMP 07/11/2017 (Exact Date) Comment: patient still has her ovaries Patient's last menstrual period was 07/11/2017 (exact date). Psych:  Alert and cooperative. Normal mood and affect. General:   Alert,  Well-developed, well-nourished, pleasant and cooperative in NAD Head:  Normocephalic and atraumatic. Eyes:  Sclera clear, no icterus.   Conjunctiva pink. Ears:  Normal auditory acuity. Nose:  No deformity, discharge, or lesions. Mouth:  No deformity or lesions,oropharynx pink & moist. Neck:  Supple; no masses or thyromegaly. Abdomen:  Normal bowel sounds.  No bruits.  Soft, non-tender and non-distended without masses, hepatosplenomegaly or hernias noted.  No guarding or rebound tenderness.    Psych:  Alert and cooperative. Normal mood and affect.  Imaging Studies: No results found.  Assessment and Plan:   Jessica Carroll is a 49 y.o. y/o female has been referred for abdominal pain.  Her main complaint is not abdominal pain but rather gas and bloating.  She has had a history of constipation.  Very likely has gas  and bloating secondary to meals introduction related to constipation.  Intake of artificial sweeteners such as Stevia is probably making the gas issue versus due to bacterial formulation and overgrowth.  She also has associated loose stools when she has significant bloating suggestive of possible irritable bowel syndrome with diarrhea.  Plan 1.  Commenced on Amitiza or Linzess, we will try and find out which is covered by her insurance. 2.  Low FODMAP diet. 3.  Avoid all artificial sweeteners and additives. 4.  If no better at next visit we will commence her on Xifaxan for IBS-D.  Follow up in 4 to 6 weeks telephone visit  Dr Jonathon Bellows MD,MRCP(U.K)

## 2020-10-13 ENCOUNTER — Other Ambulatory Visit: Payer: Self-pay

## 2020-10-13 MED ORDER — TRULANCE 3 MG PO TABS: 3.0000 mg | ORAL_TABLET | Freq: Every day | ORAL | 2 refills | Status: DC

## 2020-10-13 NOTE — Telephone Encounter (Signed)
Ok lets  give her trulance

## 2020-10-24 ENCOUNTER — Other Ambulatory Visit: Payer: Self-pay | Admitting: Family Medicine

## 2020-10-24 DIAGNOSIS — J4540 Moderate persistent asthma, uncomplicated: Secondary | ICD-10-CM

## 2020-10-24 NOTE — Telephone Encounter (Signed)
Requested Prescriptions  Pending Prescriptions Disp Refills   fluticasone furoate-vilanterol (BREO ELLIPTA) 100-25 MCG/INH AEPB [Pharmacy Med Name: BREO ELLIPTA 100-25 MCG/INH INH AEP] 60 each 5    Sig: INHALE 1 PUFF INTO LUNGS ONCE DAILY (RINSE, GARGLE & SPIT AFTER USE)     Pulmonology:  Combination Products Passed - 10/24/2020 12:14 PM      Passed - Valid encounter within last 12 months    Recent Outpatient Visits          2 months ago B12 deficiency   [facility], Drue Stager, MD   6 months ago Moderate persistent asthma without complication   [facility], Drue Stager, MD   9 months ago Moderate persistent asthma with acute exacerbation   [place], [place], FNP   10 months ago Well adult exam   [facility] Steele Sizer, MD   11 months ago Postpartum mastalgia   [facility] Delsa Grana, PA-C      Future Appointments            In 1 month Jonathon Bellows, MD [place]   In 2 months Steele Sizer, MD [facility], [facility]

## 2020-10-26 ENCOUNTER — Other Ambulatory Visit: Payer: Self-pay

## 2020-10-26 DIAGNOSIS — J4540 Moderate persistent asthma, uncomplicated: Secondary | ICD-10-CM

## 2020-10-27 ENCOUNTER — Other Ambulatory Visit: Payer: Self-pay

## 2020-10-27 ENCOUNTER — Encounter: Payer: Self-pay | Admitting: Internal Medicine

## 2020-10-27 ENCOUNTER — Ambulatory Visit: Payer: BC Managed Care – PPO | Admitting: Internal Medicine

## 2020-10-27 VITALS — BP 120/70 | HR 74 | Resp 16 | Ht 65.0 in | Wt 129.7 lb

## 2020-10-27 DIAGNOSIS — M545 Low back pain, unspecified: Secondary | ICD-10-CM

## 2020-10-27 DIAGNOSIS — M62830 Muscle spasm of back: Secondary | ICD-10-CM | POA: Diagnosis not present

## 2020-10-27 MED ORDER — PREDNISONE 10 MG PO TABS: ORAL_TABLET | ORAL | 0 refills | Status: DC

## 2020-10-27 MED ORDER — CYCLOBENZAPRINE HCL 5 MG PO TABS: 5.0000 mg | ORAL_TABLET | Freq: Three times a day (TID) | ORAL | 1 refills | Status: DC | PRN

## 2020-10-27 NOTE — Patient Instructions (Signed)
Please pick up the medications prescribed to help with your symptoms.  Also contrast therapy is recommended as we discussed, which includes warm compresses, followed by gentle range of motion activities, followed by cold to help lessen inflammation.  Continue with the extra strength Tylenol product as needed for discomfort, and can use an occasional ibuprofen in combination, take with food, although if it bothers your stomach, take with just using the Tylenol products.   Acute Back Pain, Adult Acute back pain is sudden and usually short-lived. It is often caused by an injury to the muscles and tissues in the back. The injury may result from:  A muscle or ligament getting overstretched or torn (strained). Ligaments are tissues that connect bones to each other. Lifting something improperly can cause a back strain.  Wear and tear (degeneration) of the spinal disks. Spinal disks are circular tissue that provides cushioning between the bones of the spine (vertebrae).  Twisting motions, such as while playing sports or doing yard work.  A hit to the back.  Arthritis. You may have a physical exam, lab tests, and imaging tests to find the cause of your pain. Acute back pain usually goes away with rest and home care. Follow these instructions at home: Managing pain, stiffness, and swelling  Take over-the-counter and prescription medicines only as told by your health care provider.  Your health care provider may recommend applying ice during the first 24-48 hours after your pain starts. To do this: ? Put ice in a plastic bag. ? Place a towel between your skin and the bag. ? Leave the ice on for 20 minutes, 2-3 times a day.  If directed, apply heat to the affected area as often as told by your health care provider. Use the heat source that your health care provider recommends, such as a moist heat pack or a heating pad. ? Place a towel between your skin and the heat source. ? Leave the heat on  for 20-30 minutes. ? Remove the heat if your skin turns bright red. This is especially important if you are unable to feel pain, heat, or cold. You have a greater risk of getting burned. Activity   Do not stay in bed. Staying in bed for more than 1-2 days can delay your recovery.  Sit up and stand up straight. Avoid leaning forward when you sit, or hunching over when you stand. ? If you work at a desk, sit close to it so you do not need to lean over. Keep your chin tucked in. Keep your neck drawn back, and keep your elbows bent at a right angle. Your arms should look like the letter "L." ? Sit high and close to the steering wheel when you drive. Add lower back (lumbar) support to your car seat, if needed.  Take short walks on even surfaces as soon as you are able. Try to increase the length of time you walk each day.  Do not sit, drive, or stand in one place for more than 30 minutes at a time. Sitting or standing for long periods of time can put stress on your back.  Do not drive or use heavy machinery while taking prescription pain medicine.  Use proper lifting techniques. When you bend and lift, use positions that put less stress on your back: ? [place] your knees. ? Keep the load close to your body. ? Avoid twisting.  Exercise regularly as told by your health care provider. Exercising helps your back heal faster and  helps prevent back injuries by keeping muscles strong and flexible.  Work with a physical therapist to make a safe exercise program, as recommended by your health care provider. Do any exercises as told by your physical therapist. Lifestyle  Maintain a healthy weight. Extra weight puts stress on your back and makes it difficult to have good posture.  Avoid activities or situations that make you feel anxious or stressed. Stress and anxiety increase muscle tension and can make back pain worse. Learn ways to manage anxiety and stress, such as through exercise. General  instructions  Sleep on a firm mattress in a comfortable position. Try lying on your side with your knees slightly bent. If you lie on your back, put a pillow under your knees.  Follow your treatment plan as told by your health care provider. This may include: ? Cognitive or behavioral therapy. ? Acupuncture or massage therapy. ? Meditation or yoga. Contact a health care provider if:  You have pain that is not relieved with rest or medicine.  You have increasing pain going down into your legs or buttocks.  Your pain does not improve after 2 weeks.  You have pain at night.  You lose weight without trying.  You have a fever or chills. Get help right away if:  You develop new bowel or bladder control problems.  You have unusual weakness or numbness in your arms or legs.  You develop nausea or vomiting.  You develop abdominal pain.  You feel faint. Summary  Acute back pain is sudden and usually short-lived.  Use proper lifting techniques. When you bend and lift, use positions that put less stress on your back.  Take over-the-counter and prescription medicines and apply heat or ice as directed by your health care provider. This information is not intended to replace advice given to you by your health care provider. Make sure you discuss any questions you have with your health care provider. Document Revised: 03/19/2019 Document Reviewed: 07/12/2017 Elsevier Patient Education  [place].

## 2020-10-27 NOTE — Progress Notes (Signed)
Patient ID: Jessica Carroll, female    DOB: 10/13/1971, 49 y.o.   MRN: [id number]  PCP: Steele Sizer, MD  Chief Complaint  Patient presents with  . Back Pain    Subjective:   Jessica Carroll is a 49 y.o. female, presents to clinic with CC of the following:  Chief Complaint  Patient presents with  . Back Pain    HPI:  Patient is a 49 year old female patient of Dr. Ancil Boozer Last visit with her was in August 2021. Follows up today with back pain.  She notes this morning when she awoke, she had marked pains across her lower back, not more one-sided, struggled to get up and out of bed.  Denies any trauma prior to the onset, and has not increased activities in the recent past with more bending or lifting activities in the last couple days.  She states it feels like muscle spasm, as it comes and goes, is much worse with attempted movements, not that problematic at rest.  She states she has had something like this in the past, and was given a prednisone product which was helpful.  She denies any pains that go down her legs, no bowel or bladder symptoms including no incontinence, no saddle anesthesia.  No numbness or tingling down the legs, no weakness in the lower extremities. Denies any urinary symptoms of concern with no frequency, no burning with urination. No fevers or recent upper respiratory infectious symptoms. She has no history of diabetes, no cancer history. Tobacco-never smoker    Patient Active Problem List   Diagnosis Date Noted  . Coronavirus infection 09/25/2019  . Moderate episode of recurrent major depressive disorder ([place]) 12/20/2018  . Iron deficiency anemia 12/19/2018  . Type A blood, Rh positive 08/22/2017  . S/P vaginal hysterectomy 07/31/2017  . Recurrent vaginitis 01/10/2017  . Hemorrhoids, external without complications 89/21/1941  . Nausea in adult patient 02/24/2016  . Endometriosis determined by laparoscopy 01/25/2016  . Chronic constipation  08/14/2015  . Monilial vaginitis 08/14/2015  . Bilateral ganglion cysts of wrists 08/14/2015  . Mild mitral regurgitation 05/19/2015  . B12 deficiency 05/18/2015  . Asthma, moderate 05/18/2015  . Anxiety 05/18/2015  . Insomnia, persistent 05/18/2015  . Eczema of hand 05/18/2015  . Viral keratitis 05/18/2015  . Migraine without aura and without status migrainosus, not intractable 05/18/2015  . Numerous moles 05/18/2015  . Allergic rhinitis 05/18/2015  . History of corneal transplant 05/18/2015  . Gastroesophageal reflux disease without esophagitis 12/16/2009      Current Outpatient Medications:  .  albuterol (PROVENTIL HFA) 108 (90 Base) MCG/ACT inhaler, Inhale 2 puffs into the lungs every 6 (six) hours as needed for wheezing or shortness of breath., Disp: 18 each, Rfl: 0 .  ALPRAZolam (XANAX) 0.5 MG tablet, Take 1 tablet (0.5 mg total) by mouth 2 (two) times daily as needed for anxiety., Disp: 10 tablet, Rfl: 0 .  carboxymethylcellul-glycerin (REFRESH OPTIVE) 0.5-0.9 % ophthalmic solution, Frequency:PHARMDIR   Dosage:0.0     Instructions:  Note:6 x OU Dose: 0.5%-0.9%, Disp: , Rfl:  .  estradiol (ESTRACE) 1 MG tablet, Take 1.5 tablets (1.5 mg total) by mouth daily., Disp: 135 tablet, Rfl: 1 .  fluticasone furoate-vilanterol (BREO ELLIPTA) 100-25 MCG/INH AEPB, INHALE 1 PUFF INTO LUNGS ONCE DAILY (RINSE, GARGLE & SPIT AFTER USE), Disp: 60 each, Rfl: 5 .  ibuprofen (ADVIL) 800 MG tablet, Take 1 tablet (800 mg total) by mouth every 8 (eight) hours as needed., Disp: 30 tablet, Rfl:  1 .  MULTIPLE VITAMIN PO, Take 1 tablet by mouth daily., Disp: , Rfl:  .  pantoprazole (PROTONIX) 40 MG tablet, Take 1 tablet (40 mg total) by mouth 2 (two) times daily before a meal., Disp: 180 tablet, Rfl: 1 .  Plecanatide (TRULANCE) 3 MG TABS, Take 3 mg by mouth daily., Disp: 30 tablet, Rfl: 2 .  prednisoLONE acetate (PRED FORTE) 1 % ophthalmic suspension, Place 1 drop into the right eye daily. , Disp: , Rfl:  .   promethazine (PHENERGAN) 12.5 MG tablet, Take 1 tablet (12.5 mg total) by mouth every 8 (eight) hours as needed for nausea or vomiting., Disp: 20 tablet, Rfl: 0 .  rizatriptan (MAXALT) 10 MG tablet, Take 1 tablet (10 mg total) by mouth 2 (two) times daily as needed., Disp: 10 tablet, Rfl: 0 .  traZODone (DESYREL) 50 MG tablet, Take 0.5-1 tablets (25-50 mg total) by mouth at bedtime as needed for sleep., Disp: 90 tablet, Rfl: 1 .  triamcinolone (NASACORT) 55 MCG/ACT AERO nasal inhaler, Place 2 sprays into the nose daily., Disp: 1 Inhaler, Rfl: 12 .  triamcinolone cream (KENALOG) 0.1 %, Apply 1 application topically 2 (two) times daily as needed., Disp: 30 g, Rfl: 0 .  valACYclovir (VALTREX) 500 MG tablet, Take 1 tablet by mouth 2 (two) times daily., Disp: , Rfl: 0   Allergies  Allergen Reactions  . Bactrim [Sulfamethoxazole-Trimethoprim] Nausea Only  . Penicillins Nausea Only     Past Surgical History:  Procedure Laterality Date  . ABDOMINAL HYSTERECTOMY    . COLONOSCOPY WITH PROPOFOL N/A 09/29/2017   Procedure: COLONOSCOPY WITH PROPOFOL;  Surgeon: Jonathon Bellows, MD;  Location: [facility] ENDOSCOPY;  Service: Gastroenterology;  Laterality: N/A;  . CORNEAL TRANSPLANT Right 01/07/2013  . ESOPHAGOGASTRODUODENOSCOPY (EGD) WITH PROPOFOL N/A 09/29/2017   Procedure: ESOPHAGOGASTRODUODENOSCOPY (EGD) WITH PROPOFOL;  Surgeon: Jonathon Bellows, MD;  Location: [facility] ENDOSCOPY;  Service: Gastroenterology;  Laterality: N/A;  . EYE SURGERY    . eyelid surgery Right June 19, 2015   [facility] by Dr. Norlene Duel  . LAPAROSCOPIC BILATERAL SALPINGECTOMY N/A 01/25/2016   Procedure: LAPAROSCOPIC BILATERAL SALPINGECTOMY, ADHESIOLYSIS, LAPAROSCOPIC EXCISION/FULGERATION ENDOMETRIOSIS ;  Surgeon: [name], MD;  Location: [facility] ORS;  Service: Gynecology;  Laterality: N/A;  . Laparoscopic excision and fulguration of endometriosis N/A    bladder flap and uterosacral ligament endometriosis, excised and cauterized  .  UPPER GI ENDOSCOPY  07/19/2012  . VAGINAL HYSTERECTOMY N/A 07/31/2017   Procedure: HYSTERECTOMY VAGINAL;  Surgeon: [name], MD;  Location: [facility] ORS;  Service: Gynecology;  Laterality: N/A;     Family History  Adopted: Yes  Problem Relation Age of Onset  . Hyperlipidemia Mother   . Hypertension Mother   . Diabetes Father   . Heart failure Father 19       hear attack  . Cancer Neg Hx   . Breast cancer Neg Hx      Social History   Tobacco Use  . Smoking status: Never Smoker  . Smokeless tobacco: Never Used  Substance Use Topics  . Alcohol use: Not Currently    Alcohol/week: 0.0 standard drinks    Comment: occ. wine    With staff assistance, above reviewed with the patient today.  ROS: As per HPI, otherwise no specific complaints on a limited and focused system review   No results found for this or any previous visit (from the past 72 hour(s)).   PHQ2/9: Depression screen [facility] 2/9 10/27/2020 08/07/2020 04/27/2020 01/03/2020 12/24/2019  Decreased  Interest 1 1 0 0 0  Down, Depressed, Hopeless 2 1 0 0 1  PHQ - 2 Score 3 2 0 0 1  Altered sleeping 1 1 3  0 2  Tired, decreased energy 1 1 3  0 2  Change in appetite 1 2 0 0 0  Feeling bad or failure about yourself  0 0 0 0 0  Trouble concentrating 0 0 0 0 0  Moving slowly or fidgety/restless 0 0 0 0 0  Suicidal thoughts 0 0 0 0 0  PHQ-9 Score 6 6 6  0 5  Difficult doing work/chores Somewhat difficult Not difficult at all Not difficult at all Not difficult at all Not difficult at all  Some recent data might be hidden   PHQ-2/9 Result reviewed, followed by Dr. Ancil Boozer for depression/anxiety/insomnia issues.  Fall Risk: Fall Risk  04/27/2020 01/03/2020 12/24/2019 11/20/2019 10/11/2019  Falls in the past year? 0 0 0 0 0  Number falls in past yr: 0 0 0 0 0  Injury with Fall? 0 0 0 0 0  Risk for fall due to : - - - - -  Follow up - Falls evaluation completed - - -      Objective:   Vitals:   10/27/20 0939  BP: 120/70   Pulse: 74  Resp: 16  SpO2: 100%  Weight: 129 lb 11.2 oz (58.8 kg)  Height: 5\' 5"  (1.651 m)    Body mass index is 21.58 kg/m.  Physical Exam   NAD, masked, sitting comfortably on the exam table, pleasant HEENT - [place]/AT, sclera anicteric, PERRL, conj - non-inj'ed,  pharynx clear Neck - supple, no rigidity Car - RRR without m/g/r Pulm- RR and effort normal at rest, CTA without wheeze or rales Abd - soft, NT diffusely, Back - no CVA tenderness, not tender to palpation in the lower back region across the lower back in a belt-like region bilaterally where she feels the discomfort.  Nontender to palpation over the lumbosacral spine, no focal SI joint tenderness, no bruising or swelling noted, Attempting to get her supine was a challenge, as she had marked discomfort trying to lay flat. Modified straight leg raise was negative. She had adequate strength in the lower extremities bilaterally, sensation intact to light touch in the distal lower extremities, Deep tendon reflexes 2+ and equal in the patella and Achilles. Ext - no LE edema distal Neuro/psychiatric - affect was not flat, appropriate with conversation  Alert and oriented, speech normal  As noted above.   Results for orders placed or performed in visit on 06/03/20  Urine Culture   Specimen: Vein; Urine   UR  Result Value Ref Range   Urine Culture, Routine Final report    Organism ID, Bacteria No growth   POC Urinalysis Dipstick OB  Result Value Ref Range   Color, UA yellow    Clarity, UA clear    Glucose, UA Negative Negative   Bilirubin, UA neg    Ketones, UA neg    Spec Grav, UA 1.010 1.010 - 1.025   Blood, UA neg    pH, UA 7.0 5.0 - 8.0   POC,PROTEIN,UA Negative Negative, Trace, Small (1+), Moderate (2+), Large (3+), 4+   Urobilinogen, UA 0.2 0.2 or 1.0 E.U./dL   Nitrite, UA neg    Leukocytes, UA Negative Negative   Appearance     Odor         Assessment & Plan:   1. Acute bilateral low back pain without  sciatica  2. Spasm of muscle of lower back  Discussed with patient that I agree this is likely a lower back muscle strain/spasm, and encouraged has no radicular signs or symptoms, no bowel or bladder symptoms of concern, no saddle anesthesia.  No urinary symptoms of concern. Felt best to manage as follows: We will continue the extra strength Tylenol products, and can mix in an ibuprofen at times, although notes that sometimes it bothers her stomach, and would only do so intermittently, and taking with food when she does, and if it does bother her stomach to just stick to extra strength Tylenol products as needed. Emphasized the importance of local measures with a contrast therapy approach including warm compresses, followed by range of motion exercises, followed by cold advised. We will add a prednisone product as she notes that was helpful previously, with a wean as ordered Also will add a Flexeril product-5 mg dose, can take up to 3 times a day acutely, although one may make drowsy and take mainly at bedtime as improving.  Not to take and drive or go to work noted. We will rest today, as she did not go to work, and continued rest with no lifting or bending activities until symptoms much improved.  - predniSONE (DELTASONE) 10 MG tablet; Take four tabs daily X 2 days, then two tabs daily X 2 days, then one tab daily X 2 days  Dispense: 14 tablet; Refill: 0 - cyclobenzaprine (FLEXERIL) 5 MG tablet; Take 1 tablet (5 mg total) by mouth 3 (three) times daily as needed for muscle spasms. Take mainly at bedtime as may make drowsy, do not take before driving.  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  That may be as soon as tomorrow, although may need further time as the week is progressing.  She does have a sit down job at  work.  Emphasized following up if symptoms not improving or more problematic as the week progresses and through the weekend,, and having a low yield for x-rays to further evaluate in that situation.    Towanda Malkin, MD 10/27/20 9:51 AM

## 2020-11-12 ENCOUNTER — Other Ambulatory Visit: Payer: Self-pay

## 2020-11-12 MED ORDER — RIFAXIMIN 550 MG PO TABS: 550.0000 mg | ORAL_TABLET | Freq: Three times a day (TID) | ORAL | 0 refills | Status: AC

## 2020-11-18 ENCOUNTER — Other Ambulatory Visit: Payer: Self-pay | Admitting: Family Medicine

## 2020-11-18 DIAGNOSIS — K219 Gastro-esophageal reflux disease without esophagitis: Secondary | ICD-10-CM

## 2020-11-18 DIAGNOSIS — R1013 Epigastric pain: Secondary | ICD-10-CM

## 2020-12-03 ENCOUNTER — Ambulatory Visit: Payer: BC Managed Care – PPO | Admitting: Gastroenterology

## 2020-12-25 ENCOUNTER — Other Ambulatory Visit: Payer: Self-pay

## 2020-12-25 ENCOUNTER — Encounter: Payer: Self-pay | Admitting: Family Medicine

## 2020-12-25 ENCOUNTER — Other Ambulatory Visit (HOSPITAL_COMMUNITY)
Admission: RE | Admit: 2020-12-25 | Discharge: 2020-12-25 | Disposition: A | Discharge status: Home / Self Care | Payer: BC Managed Care – PPO | Source: Ambulatory Visit | Attending: Family Medicine | Admitting: Family Medicine

## 2020-12-25 ENCOUNTER — Ambulatory Visit (INDEPENDENT_AMBULATORY_CARE_PROVIDER_SITE_OTHER): Payer: BC Managed Care – PPO | Admitting: Family Medicine

## 2020-12-25 VITALS — BP 110/70 | HR 60 | Temp 97.9°F | Resp 16 | Ht 65.0 in | Wt 127.9 lb

## 2020-12-25 DIAGNOSIS — E538 Deficiency of other specified B group vitamins: Secondary | ICD-10-CM | POA: Diagnosis not present

## 2020-12-25 DIAGNOSIS — L309 Dermatitis, unspecified: Secondary | ICD-10-CM

## 2020-12-25 DIAGNOSIS — N898 Other specified noninflammatory disorders of vagina: Secondary | ICD-10-CM

## 2020-12-25 DIAGNOSIS — G43009 Migraine without aura, not intractable, without status migrainosus: Secondary | ICD-10-CM

## 2020-12-25 DIAGNOSIS — Z131 Encounter for screening for diabetes mellitus: Secondary | ICD-10-CM

## 2020-12-25 DIAGNOSIS — Z13220 Encounter for screening for lipoid disorders: Secondary | ICD-10-CM | POA: Diagnosis not present

## 2020-12-25 DIAGNOSIS — K581 Irritable bowel syndrome with constipation: Secondary | ICD-10-CM

## 2020-12-25 DIAGNOSIS — Z862 Personal history of diseases of the blood and blood-forming organs and certain disorders involving the immune mechanism: Secondary | ICD-10-CM | POA: Diagnosis not present

## 2020-12-25 DIAGNOSIS — K219 Gastro-esophageal reflux disease without esophagitis: Secondary | ICD-10-CM

## 2020-12-25 DIAGNOSIS — J302 Other seasonal allergic rhinitis: Secondary | ICD-10-CM

## 2020-12-25 DIAGNOSIS — Z79899 Other long term (current) drug therapy: Secondary | ICD-10-CM

## 2020-12-25 DIAGNOSIS — L308 Other specified dermatitis: Secondary | ICD-10-CM

## 2020-12-25 DIAGNOSIS — N952 Postmenopausal atrophic vaginitis: Secondary | ICD-10-CM

## 2020-12-25 DIAGNOSIS — F341 Dysthymic disorder: Secondary | ICD-10-CM

## 2020-12-25 DIAGNOSIS — J4540 Moderate persistent asthma, uncomplicated: Secondary | ICD-10-CM

## 2020-12-25 DIAGNOSIS — R110 Nausea: Secondary | ICD-10-CM

## 2020-12-25 DIAGNOSIS — F419 Anxiety disorder, unspecified: Secondary | ICD-10-CM

## 2020-12-25 DIAGNOSIS — R1013 Epigastric pain: Secondary | ICD-10-CM

## 2020-12-25 DIAGNOSIS — Z1231 Encounter for screening mammogram for malignant neoplasm of breast: Secondary | ICD-10-CM

## 2020-12-25 DIAGNOSIS — J3089 Other allergic rhinitis: Secondary | ICD-10-CM

## 2020-12-25 DIAGNOSIS — Z0000 Encounter for general adult medical examination without abnormal findings: Secondary | ICD-10-CM

## 2020-12-25 MED ORDER — TRIAMCINOLONE ACETONIDE 0.1 % EX CREA: 1.0000 "application " | TOPICAL_CREAM | Freq: Two times a day (BID) | CUTANEOUS | 0 refills | Status: DC | PRN

## 2020-12-25 MED ORDER — PROMETHAZINE HCL 12.5 MG PO TABS: 12.5000 mg | ORAL_TABLET | Freq: Three times a day (TID) | ORAL | 0 refills | Status: DC | PRN

## 2020-12-25 MED ORDER — PREMARIN 0.625 MG/GM VA CREA: 1.0000 | TOPICAL_CREAM | Freq: Every day | VAGINAL | 12 refills | Status: DC

## 2020-12-25 MED ORDER — PANTOPRAZOLE SODIUM 40 MG PO TBEC: 40.0000 mg | DELAYED_RELEASE_TABLET | Freq: Two times a day (BID) | ORAL | 1 refills | Status: DC

## 2020-12-25 MED ORDER — RIZATRIPTAN BENZOATE 10 MG PO TABS: 10.0000 mg | ORAL_TABLET | Freq: Two times a day (BID) | ORAL | 0 refills | Status: DC | PRN

## 2020-12-25 MED ORDER — ALPRAZOLAM 0.5 MG PO TABS: 0.5000 mg | ORAL_TABLET | Freq: Two times a day (BID) | ORAL | 0 refills | Status: DC | PRN

## 2020-12-25 NOTE — Patient Instructions (Signed)
Preventive Care 84-51 Years Old, Female Preventive care refers to lifestyle choices and visits with your health care provider that can promote health and wellness. This includes:  A yearly physical exam. This is also called an annual wellness visit.  Regular dental and eye exams.  Immunizations.  Screening for certain conditions.  Healthy lifestyle choices, such as: ? Eating a healthy diet. ? Getting regular exercise. ? Not using drugs or products that contain nicotine and tobacco. ? Limiting alcohol use. What can I expect for my preventive care visit? Physical exam Your health care provider will check your:  Height and weight. These may be used to calculate your BMI (body mass index). BMI is a measurement that tells if you are at a healthy weight.  Heart rate and blood pressure.  Body temperature.  Skin for abnormal spots. Counseling Your health care provider may ask you questions about your:  Past medical problems.  Family's medical history.  Alcohol, tobacco, and drug use.  Emotional well-being.  Home life and relationship well-being.  Sexual activity.  Diet, exercise, and sleep habits.  Work and work Statistician.  Access to firearms.  Method of birth control.  Menstrual cycle.  Pregnancy history. What immunizations do I need? Vaccines are usually given at various ages, according to a schedule. Your health care provider will recommend vaccines for you based on your age, medical history, and lifestyle or other factors, such as travel or where you work.   What tests do I need? Blood tests  Lipid and cholesterol levels. These may be checked every 5 years, or more often if you are over 3 years old.  Hepatitis C test.  Hepatitis B test. Screening  Lung cancer screening. You may have this screening every year starting at age 73 if you have a 30-pack-year history of smoking and currently smoke or have quit within the past 15 years.  Colorectal cancer  screening. ? All adults should have this screening starting at age 52 and continuing until age 17. ? Your health care provider may recommend screening at age 49 if you are at increased risk. ? You will have tests every 1-10 years, depending on your results and the type of screening test.  Diabetes screening. ? This is done by checking your blood sugar (glucose) after you have not eaten for a while (fasting). ? You may have this done every 1-3 years.  Mammogram. ? This may be done every 1-2 years. ? Talk with your health care provider about when you should start having regular mammograms. This may depend on whether you have a family history of breast cancer.  BRCA-related cancer screening. This may be done if you have a family history of breast, ovarian, tubal, or peritoneal cancers.  Pelvic exam and Pap test. ? This may be done every 3 years starting at age 10. ? Starting at age 11, this may be done every 5 years if you have a Pap test in combination with an HPV test. Other tests  STD (sexually transmitted disease) testing, if you are at risk.  Bone density scan. This is done to screen for osteoporosis. You may have this scan if you are at high risk for osteoporosis. Talk with your health care provider about your test results, treatment options, and if necessary, the need for more tests. Follow these instructions at home: Eating and drinking  Eat a diet that includes fresh fruits and vegetables, whole grains, lean protein, and low-fat dairy products.  Take vitamin and mineral supplements  as recommended by your health care provider.  Do not drink alcohol if: ? Your health care provider tells you not to drink. ? You are pregnant, may be pregnant, or are planning to become pregnant.  If you drink alcohol: ? Limit how much you have to 0-1 drink a day. ? Be aware of how much alcohol is in your drink. In the [place] one drink equals one 12 oz bottle of beer (355 mL), one 5 oz glass of  wine (148 mL), or one 1 oz glass of hard liquor (44 mL).   Lifestyle  Take daily care of your teeth and gums. Brush your teeth every morning and night with fluoride toothpaste. Floss one time each day.  Stay active. Exercise for at least 30 minutes 5 or more days each week.  Do not use any products that contain nicotine or tobacco, such as cigarettes, e-cigarettes, and chewing tobacco. If you need help quitting, ask your health care provider.  Do not use drugs.  If you are sexually active, practice safe sex. Use a condom or other form of protection to prevent STIs (sexually transmitted infections).  If you do not wish to become pregnant, use a form of birth control. If you plan to become pregnant, see your health care provider for a prepregnancy visit.  If told by your health care provider, take low-dose aspirin daily starting at age 13.  Find healthy ways to cope with stress, such as: ? Meditation, yoga, or listening to music. ? Journaling. ? Talking to a trusted person. ? Spending time with friends and family. Safety  Always wear your seat belt while driving or riding in a vehicle.  Do not drive: ? If you have been drinking alcohol. Do not ride with someone who has been drinking. ? When you are tired or distracted. ? While texting.  Wear a helmet and other protective equipment during sports activities.  If you have firearms in your house, make sure you follow all gun safety procedures. What's next?  Visit your health care provider once a year for an annual wellness visit.  Ask your health care provider how often you should have your eyes and teeth checked.  Stay up to date on all vaccines. This information is not intended to replace advice given to you by your health care provider. Make sure you discuss any questions you have with your health care provider. Document Revised: 09/01/2020 Document Reviewed: 08/09/2018 Elsevier Patient Education  2021 [employer].

## 2020-12-25 NOTE — Progress Notes (Signed)
Name: Jessica Carroll   MRN: [id number]    DOB: [birth year]   Date:12/25/2020       Progress Note  Subjective  Chief Complaint  Chief Complaint  Patient presents with  . Annual Exam    And follow up    HPI  Patient presents for annual CPE and follow up.. Discussed that she may have kick back co-pay or cost due to split visit   Depression/anxiety/Insomnia: phq 9 is still positive at 6 today, she states less help at work, feels overwhelmed , working 10 hours 7 days a week and it has not helped. She feels worse in the morning and the end of the day. She states her step-son causes her to feel angry, and he comes over every weekend.   Chronic constipation and recently diagnosed with IBS by Dr. Vicente Males, she is now on Trulance and it has helped with bloating and pain   Asthma moderated: had COVID-19 in October2020 , she takes Breo daily and no wheezing, cough or sob. She has been using Ventolin more often, but the way she described symptoms it seems to be secondary to anxiety   GERD:She is now on Protonix BID , dexilant stopped working. No heartburn or indigestion at this time.  History of iron deficiency and B12 deficiency:Symptoms improved since she got iron infusion.Last ferritin was high and she has been off iron supplementation, only taking B12 sublingual   Migraine:she was  doing well, but recently her pain has been more often, she is under more stress, taking medication prn. She states last episode was a couple of days ago, very painful behind left eye, pressure on temples helped alleviate the pain. Medication helps   Insomnia:taking Trazodone prn and no side effects  Diet: balanced , enough calcium  Exercise: she still uses the treadmill three days a week but only for 10  Minutes , discussed 150 minutes per week.   [place] Office Visit from 12/25/2020 in [facility]  AUDIT-C Score 0     Depression: Phq 9 is  positive Depression screen [facility] 2/9  12/25/2020 10/27/2020 08/07/2020 04/27/2020 01/03/2020  Decreased Interest '1 1 1 ' 0 0  Down, Depressed, Hopeless '1 2 1 ' 0 0  PHQ - 2 Score '2 3 2 ' 0 0  Altered sleeping '2 1 1 3 ' 0  Tired, decreased energy '2 1 1 3 ' 0  Change in appetite 0 1 2 0 0  Feeling bad or failure about yourself  0 0 0 0 0  Trouble concentrating 0 0 0 0 0  Moving slowly or fidgety/restless 0 0 0 0 0  Suicidal thoughts 0 0 0 0 0  PHQ-9 Score '6 6 6 6 ' 0  Difficult doing work/chores Not difficult at all Somewhat difficult Not difficult at all Not difficult at all Not difficult at all  Some recent data might be hidden   Hypertension: BP Readings from Last 3 Encounters:  12/25/20 110/70  10/27/20 120/70  10/12/20 115/72   Obesity: Wt Readings from Last 3 Encounters:  12/25/20 127 lb 14.4 oz (58 kg)  10/27/20 129 lb 11.2 oz (58.8 kg)  10/12/20 129 lb (58.5 kg)   BMI Readings from Last 3 Encounters:  12/25/20 21.28 kg/m  10/27/20 21.58 kg/m  10/12/20 21.47 kg/m     Vaccines:  HPV: up to at age 66 , ask insurance if age between 62-45  Shingrix: 59-64 yo and ask insurance if covered when patient above 61 yo Pneumonia:  educated and  discussed with patient. Flu:  educated and discussed with patient.  Hep C Screening: 12/24/19 STD testing and prevention (HIV/chl/gon/syphilis): not interested  Intimate partner violence: negative Sexual History :one partner, occasionally has discomfort due to dryness  Menstrual History/LMP/Abnormal Bleeding: s/p hysterectomy  Incontinence Symptoms:  No problems   Breast cancer:  - Last Mammogram: 12/20/19 - BRCA gene screening: N/A  Osteoporosis: Discussed high calcium and vitamin D supplementation, weight bearing exercises  Cervical cancer screening: N/A  Skin cancer: Discussed monitoring for atypical lesions  Colorectal cancer: repeat in 2028  Lung cancer:   Low Dose CT Chest recommended if Age 4-80 years, 20 pack-year currently smoking OR have quit w/in 15years. Patient does  not qualify.   ECG: 05/03/10  Advanced Care Planning: A voluntary discussion about advance care planning including the explanation and discussion of advance directives.  Discussed health care proxy and Living will, and the patient was able to identify a health care proxy as mother .   Lipids: Lab Results  Component Value Date   CHOL 197 12/24/2019   CHOL 183 10/17/2018   CHOL 172 08/22/2017   Lab Results  Component Value Date   HDL 68 12/24/2019   HDL 62 10/17/2018   HDL 53 08/22/2017   Lab Results  Component Value Date   LDLCALC 118 (H) 12/24/2019   LDLCALC 108 (H) 10/17/2018   LDLCALC 102 (H) 08/22/2017   Lab Results  Component Value Date   TRIG 58 12/24/2019   TRIG 51 10/17/2018   TRIG 76 08/22/2017   Lab Results  Component Value Date   CHOLHDL 2.9 12/24/2019   CHOLHDL 3.0 10/17/2018   CHOLHDL 3.2 08/22/2017   No results found for: LDLDIRECT  Glucose: Glucose  Date Value Ref Range Status  12/24/2019 87 65 - 99 mg/dL Final   Glucose, Bld  Date Value Ref Range Status  12/06/2018 87 70 - 99 mg/dL Final  10/17/2018 82 65 - 99 mg/dL Final    Comment:    .            Fasting reference interval .   08/22/2017 86 65 - 99 mg/dL Final    Comment:    .            Fasting reference interval .     Patient Active Problem List   Diagnosis Date Noted  . Acute bilateral low back pain without sciatica 10/27/2020  . Coronavirus infection 09/25/2019  . Moderate episode of recurrent major depressive disorder ([place]) 12/20/2018  . Iron deficiency anemia 12/19/2018  . Type A blood, Rh positive 08/22/2017  . S/P vaginal hysterectomy 07/31/2017  . Recurrent vaginitis 01/10/2017  . Hemorrhoids, external without complications 82/50/5397  . Nausea in adult patient 02/24/2016  . Endometriosis determined by laparoscopy 01/25/2016  . Chronic constipation 08/14/2015  . Monilial vaginitis 08/14/2015  . Bilateral ganglion cysts of wrists 08/14/2015  . Mild mitral  regurgitation 05/19/2015  . B12 deficiency 05/18/2015  . Asthma, moderate 05/18/2015  . Anxiety 05/18/2015  . Insomnia, persistent 05/18/2015  . Eczema of hand 05/18/2015  . Viral keratitis 05/18/2015  . Migraine without aura and without status migrainosus, not intractable 05/18/2015  . Numerous moles 05/18/2015  . Allergic rhinitis 05/18/2015  . History of corneal transplant 05/18/2015  . Gastroesophageal reflux disease without esophagitis 12/16/2009    Past Surgical History:  Procedure Laterality Date  . COLONOSCOPY WITH PROPOFOL N/A 09/29/2017   Procedure: COLONOSCOPY WITH PROPOFOL;  Surgeon: Jonathon Bellows, MD;  Location: [facility];  Service: Gastroenterology;  Laterality: N/A;  . CORNEAL TRANSPLANT Right 01/07/2013  . ESOPHAGOGASTRODUODENOSCOPY (EGD) WITH PROPOFOL N/A 09/29/2017   Procedure: ESOPHAGOGASTRODUODENOSCOPY (EGD) WITH PROPOFOL;  Surgeon: Jonathon Bellows, MD;  Location: [facility] ENDOSCOPY;  Service: Gastroenterology;  Laterality: N/A;  . EYE SURGERY    . eyelid surgery Right June 19, 2015   [facility] by Dr. Norlene Duel  . LAPAROSCOPIC BILATERAL SALPINGECTOMY N/A 01/25/2016   Procedure: LAPAROSCOPIC BILATERAL SALPINGECTOMY, ADHESIOLYSIS, LAPAROSCOPIC EXCISION/FULGERATION ENDOMETRIOSIS ;  Surgeon: [name], MD;  Location: [facility] ORS;  Service: Gynecology;  Laterality: N/A;  . Laparoscopic excision and fulguration of endometriosis N/A    bladder flap and uterosacral ligament endometriosis, excised and cauterized  . UPPER GI ENDOSCOPY  07/19/2012  . VAGINAL HYSTERECTOMY N/A 07/31/2017   Procedure: HYSTERECTOMY VAGINAL;  Surgeon: [name], MD;  Location: [facility] ORS;  Service: Gynecology;  Laterality: N/A;    Family History  Adopted: Yes  Problem Relation Age of Onset  . Hyperlipidemia Mother   . Hypertension Mother   . Diabetes Father   . Heart failure Father 73       hear attack  . Cancer Neg Hx   . Breast cancer Neg Hx     Social History    Socioeconomic History  . Marital status: Single    Spouse name: Not on file  . Number of children: 0  . Years of education: 54  . Highest education level: High school graduate  Occupational History  . Occupation: textile  Tobacco Use  . Smoking status: Never Smoker  . Smokeless tobacco: Never Used  Vaping Use  . Vaping Use: Never used  Substance and Sexual Activity  . Alcohol use: Not Currently    Alcohol/week: 0.0 standard drinks    Comment: occ. wine  . Drug use: No  . Sexual activity: Yes    Partners: Male    Birth control/protection: None, Surgical  Other Topics Concern  . Not on file  Social History Narrative   Engaged to get married to her boyfriend of 14 years.   Social Determinants of Health   Financial Resource Strain: Low Risk   . Difficulty of Paying Living Expenses: Not hard at all  Food Insecurity: No Food Insecurity  . Worried About Charity fundraiser in the Last Year: Never true  . Ran Out of Food in the Last Year: Never true  Transportation Needs: No Transportation Needs  . Lack of Transportation (Medical): No  . Lack of Transportation (Non-Medical): No  Physical Activity: Insufficiently Active  . Days of Exercise per Week: 3 days  . Minutes of Exercise per Session: 10 min  Stress: Stress Concern Present  . Feeling of Stress : Rather much  Social Connections: Moderately Integrated  . Frequency of Communication with Friends and Family: More than three times a week  . Frequency of Social Gatherings with Friends and Family: Once a week  . Attends Religious Services: 1 to 4 times per year  . Active Member of Clubs or Organizations: No  . Attends Archivist Meetings: Never  . Marital Status: Living with partner  Intimate Partner Violence: Not At Risk  . Fear of Current or Ex-Partner: No  . Emotionally Abused: No  . Physically Abused: No  . Sexually Abused: No     Current Outpatient Medications:  .  albuterol (PROVENTIL HFA) 108 (90  Base) MCG/ACT inhaler, Inhale 2 puffs into the lungs every 6 (six) hours as needed for wheezing or shortness of  breath., Disp: 18 each, Rfl: 0 .  carboxymethylcellul-glycerin (REFRESH OPTIVE) 0.5-0.9 % ophthalmic solution, Frequency:PHARMDIR   Dosage:0.0     Instructions:  Note:6 x OU Dose: 0.5%-0.9%, Disp: , Rfl:  .  estradiol (ESTRACE) 1 MG tablet, Take 1.5 tablets (1.5 mg total) by mouth daily., Disp: 135 tablet, Rfl: 1 .  fluticasone furoate-vilanterol (BREO ELLIPTA) 100-25 MCG/INH AEPB, INHALE 1 PUFF INTO LUNGS ONCE DAILY (RINSE, GARGLE & SPIT AFTER USE), Disp: 60 each, Rfl: 5 .  ibuprofen (ADVIL) 800 MG tablet, Take 1 tablet (800 mg total) by mouth every 8 (eight) hours as needed., Disp: 30 tablet, Rfl: 1 .  MULTIPLE VITAMIN PO, Take 1 tablet by mouth daily., Disp: , Rfl:  .  Plecanatide (TRULANCE) 3 MG TABS, Take 3 mg by mouth daily., Disp: 30 tablet, Rfl: 2 .  traZODone (DESYREL) 50 MG tablet, Take 0.5-1 tablets (25-50 mg total) by mouth at bedtime as needed for sleep., Disp: 90 tablet, Rfl: 1 .  triamcinolone (NASACORT) 55 MCG/ACT AERO nasal inhaler, Place 2 sprays into the nose daily., Disp: 1 Inhaler, Rfl: 12 .  valACYclovir (VALTREX) 500 MG tablet, Take 1 tablet by mouth 2 (two) times daily., Disp: , Rfl: 0 .  ALPRAZolam (XANAX) 0.5 MG tablet, Take 1 tablet (0.5 mg total) by mouth 2 (two) times daily as needed for anxiety., Disp: 10 tablet, Rfl: 0 .  pantoprazole (PROTONIX) 40 MG tablet, Take 1 tablet (40 mg total) by mouth 2 (two) times daily before a meal., Disp: 180 tablet, Rfl: 1 .  prednisoLONE acetate (PRED FORTE) 1 % ophthalmic suspension, Place 1 drop into the right eye daily.  (Patient not taking: Reported on 12/25/2020), Disp: , Rfl:  .  promethazine (PHENERGAN) 12.5 MG tablet, Take 1 tablet (12.5 mg total) by mouth every 8 (eight) hours as needed for nausea or vomiting., Disp: 20 tablet, Rfl: 0 .  rizatriptan (MAXALT) 10 MG tablet, Take 1 tablet (10 mg total) by mouth 2 (two)  times daily as needed., Disp: 10 tablet, Rfl: 0 .  triamcinolone (KENALOG) 0.1 %, Apply 1 application topically 2 (two) times daily as needed., Disp: 80 g, Rfl: 0  Allergies  Allergen Reactions  . Bactrim [Sulfamethoxazole-Trimethoprim] Nausea Only  . Penicillins Nausea Only     ROS  Constitutional: Negative for fever or weight change.  Respiratory: positive  for intermittent cough and shortness of breath.   Cardiovascular: Negative for chest pain or palpitations.  Gastrointestinal: Negative for abdominal pain, no bowel changes.  Musculoskeletal: Negative for gait problem or joint swelling.  Skin: Negative for rash.  Neurological: Negative for dizziness , positive for intermittent  headache.  No other specific complaints in a complete review of systems (except as listed in HPI above).  Objective  Vitals:   12/25/20 0759  BP: 110/70  Pulse: 60  Resp: 16  Temp: 97.9 F (36.6 C)  SpO2: 99%  Weight: 127 lb 14.4 oz (58 kg)  Height: '5\' 5"'  (1.651 m)    Body mass index is 21.28 kg/m.  Physical Exam  Constitutional: Patient appears well-developed and well-nourished. No distress.  HENT: Head: Normocephalic and atraumatic. Ears: B TMs ok, no erythema or effusion; Nose: Nose normal. Mouth/Throat: Oropharynx is clear and moist. No oropharyngeal exudate.  Eyes: Conjunctivae and EOM are normal. Pupils are equal, round, and reactive to light. No scleral icterus.  Neck: Normal range of motion. Neck supple. No JVD present. No thyromegaly present.  Cardiovascular: Normal rate, regular rhythm and normal heart sounds.  No murmur heard. No BLE edema. Pulmonary/Chest: Effort normal and breath sounds normal. No respiratory distress. Abdominal: Soft. Bowel sounds are normal, no distension. There is no tenderness. no masses Breast: no lumps or masses, no nipple discharge or rashes FEMALE GENITALIA:  External genitalia normal External urethra normal Vaginal vault normal without discharge or  lesions Cervix absent  Bimanual exam normal without masses RECTAL: not done  Musculoskeletal: Normal range of motion, no joint effusions. No gross deformities Neurological: he is alert and oriented to person, place, and time. No cranial nerve deficit. Coordination, balance, strength, speech and gait are normal.  Skin: Skin is warm and dry. No rash noted. No erythema.  Psychiatric: Patient has a normal mood and affect. behavior is normal. Judgment and thought content normal.  Fall Risk: Fall Risk  12/25/2020 04/27/2020 01/03/2020 12/24/2019 11/20/2019  Falls in the past year? 0 0 0 0 0  Number falls in past yr: 0 0 0 0 0  Injury with Fall? 0 0 0 0 0  Risk for fall due to : - - - - -  Follow up - - Falls evaluation completed - -    Functional Status Survey: Is the patient deaf or have difficulty hearing?: No Does the patient have difficulty seeing, even when wearing glasses/contacts?: No Does the patient have difficulty concentrating, remembering, or making decisions?: No Does the patient have difficulty walking or climbing stairs?: No Does the patient have difficulty dressing or bathing?: No Does the patient have difficulty doing errands alone such as visiting a doctor's office or shopping?: No   Assessment & Plan  1. Migraine without aura and without status migrainosus, not intractable  - rizatriptan (MAXALT) 10 MG tablet; Take 1 tablet (10 mg total) by mouth 2 (two) times daily as needed.  Dispense: 10 tablet; Refill: 0  2. Gastroesophageal reflux disease without esophagitis  - pantoprazole (PROTONIX) 40 MG tablet; Take 1 tablet (40 mg total) by mouth 2 (two) times daily before a meal.  Dispense: 180 tablet; Refill: 1  3. B12 deficiency  - B12 and Folate Panel - CBC with Differential/Platelet - Vitamin B12  4. History of iron deficiency anemia  - Iron, TIBC and Ferritin Panel  5. Moderate persistent asthma without complication   6. Dysthymia   7. Perennial allergic  rhinitis with seasonal variation   8. Anxiety  - ALPRAZolam (XANAX) 0.5 MG tablet; Take 1 tablet (0.5 mg total) by mouth 2 (two) times daily as needed for anxiety.  Dispense: 10 tablet; Refill: 0  9. Eczema of hand  - triamcinolone (KENALOG) 0.1 %; Apply 1 application topically 2 (two) times daily as needed.  Dispense: 80 g; Refill: 0  10. Long-term use of high-risk medication  - COMPLETE METABOLIC PANEL WITH GFR  11. Lipid screening  - Lipid panel  12. Diabetes mellitus screening  - Hemoglobin A1c  13. Irritable bowel syndrome with constipation   14. Nausea  - promethazine (PHENERGAN) 12.5 MG tablet; Take 1 tablet (12.5 mg total) by mouth every 8 (eight) hours as needed for nausea or vomiting.  Dispense: 20 tablet; Refill: 0  15. Epigastric pain  - pantoprazole (PROTONIX) 40 MG tablet; Take 1 tablet (40 mg total) by mouth 2 (two) times daily before a meal.  Dispense: 180 tablet; Refill: 1  16. Well adult exam   17. Breast cancer screening by mammogram  - MM 3D SCREEN BREAST BILATERAL; Future  18. Vaginal itching  - Cervicovaginal ancillary only  -USPSTF grade A and B  recommendations reviewed with patient; age-appropriate recommendations, preventive care, screening tests, etc discussed and encouraged; healthy living encouraged; see AVS for patient education given to patient -Discussed importance of 150 minutes of physical activity weekly, eat two servings of fish weekly, eat one serving of tree nuts ( cashews, pistachios, pecans, almonds.Marland Kitchen) every other day, eat 6 servings of fruit/vegetables daily and drink plenty of water and avoid sweet beverages.

## 2020-12-26 LAB — COMPLETE METABOLIC PANEL WITH GFR
AG Ratio: 2 (calc) (ref 1.0–2.5)
ALT: 9 U/L (ref 6–29)
AST: 15 U/L (ref 10–35)
Albumin: 4.5 g/dL (ref 3.6–5.1)
Alkaline phosphatase (APISO): 45 U/L (ref 37–153)
BUN: 12 mg/dL (ref 7–25)
CO2: 28 mmol/L (ref 20–32)
Calcium: 9.6 mg/dL (ref 8.6–10.4)
Chloride: 104 mmol/L (ref 98–110)
Creat: 0.68 mg/dL (ref 0.50–1.05)
GFR, Est African American: 118 mL/min/{1.73_m2} (ref >=60)
GFR, Est Non African American: 102 mL/min/{1.73_m2} (ref >=60)
Globulin: 2.3 g/dL (calc) (ref 1.9–3.7)
Glucose, Bld: 94 mg/dL (ref 65–99)
Potassium: 4.8 mmol/L (ref 3.5–5.3)
Sodium: 139 mmol/L (ref 135–146)
Total Bilirubin: 1.5 mg/dL — ABNORMAL HIGH (ref 0.2–1.2)
Total Protein: 6.8 g/dL (ref 6.1–8.1)

## 2020-12-26 LAB — IRON,TIBC AND FERRITIN PANEL
%SAT: 31 % (calc) (ref 16–45)
Ferritin: 185 ng/mL (ref 16–232)
Iron: 93 ug/dL (ref 45–160)
TIBC: 296 mcg/dL (calc) (ref 250–450)

## 2020-12-26 LAB — LIPID PANEL
Cholesterol: 188 mg/dL (ref <200)
HDL: 58 mg/dL (ref >=50)
LDL Cholesterol (Calc): 116 mg/dL (calc) — ABNORMAL HIGH
Non-HDL Cholesterol (Calc): 130 mg/dL (calc) — ABNORMAL HIGH (ref <130)
Total CHOL/HDL Ratio: 3.2 (calc) (ref <5.0)
Triglycerides: 57 mg/dL (ref <150)

## 2020-12-26 LAB — CBC WITH DIFFERENTIAL/PLATELET
Absolute Monocytes: 358 cells/uL (ref 200–950)
Basophils Absolute: 49 cells/uL (ref 0–200)
Basophils Relative: 1 %
Eosinophils Absolute: 108 cells/uL (ref 15–500)
Eosinophils Relative: 2.2 %
HCT: 35.2 % (ref 35.0–45.0)
Hemoglobin: 11.6 g/dL — ABNORMAL LOW (ref 11.7–15.5)
Lymphs Abs: 1975 cells/uL (ref 850–3900)
MCH: 30.4 pg (ref 27.0–33.0)
MCHC: 33 g/dL (ref 32.0–36.0)
MCV: 92.4 fL (ref 80.0–100.0)
MPV: 12.4 fL (ref 7.5–12.5)
Monocytes Relative: 7.3 %
Neutro Abs: 2411 cells/uL (ref 1500–7800)
Neutrophils Relative %: 49.2 %
Platelets: 227 10*3/uL (ref 140–400)
RBC: 3.81 10*6/uL (ref 3.80–5.10)
RDW: 11.5 % (ref 11.0–15.0)
Total Lymphocyte: 40.3 %
WBC: 4.9 10*3/uL (ref 3.8–10.8)

## 2020-12-26 LAB — B12 AND FOLATE PANEL
Folate: 12.1 ng/mL
Vitamin B-12: 275 pg/mL (ref 200–1100)

## 2020-12-26 LAB — HEMOGLOBIN A1C
Hgb A1c MFr Bld: 5.3 % of total Hgb (ref <5.7)
Mean Plasma Glucose: 105 mg/dL
eAG (mmol/L): 5.8 mmol/L

## 2020-12-28 LAB — CERVICOVAGINAL ANCILLARY ONLY
Bacterial Vaginitis (gardnerella): NEGATIVE
Candida Glabrata: POSITIVE — AB
Candida Vaginitis: NEGATIVE
Comment: NEGATIVE
Comment: NEGATIVE
Comment: NEGATIVE
Comment: NEGATIVE
Trichomonas: NEGATIVE

## 2020-12-30 ENCOUNTER — Other Ambulatory Visit: Payer: Self-pay

## 2020-12-30 ENCOUNTER — Encounter: Payer: Self-pay | Admitting: Family Medicine

## 2020-12-30 MED ORDER — TRULANCE 3 MG PO TABS
3.0000 mg | ORAL_TABLET | Freq: Every day | ORAL | 2 refills | Status: DC
Start: 2020-12-30 — End: 2021-03-22

## 2021-01-15 DIAGNOSIS — H179 Unspecified corneal scar and opacity: Secondary | ICD-10-CM | POA: Diagnosis not present

## 2021-01-19 DIAGNOSIS — H179 Unspecified corneal scar and opacity: Secondary | ICD-10-CM | POA: Insufficient documentation

## 2021-01-19 DIAGNOSIS — H2511 Age-related nuclear cataract, right eye: Secondary | ICD-10-CM | POA: Insufficient documentation

## 2021-01-21 ENCOUNTER — Other Ambulatory Visit: Payer: Self-pay | Admitting: Family Medicine

## 2021-01-21 ENCOUNTER — Other Ambulatory Visit: Payer: Self-pay

## 2021-01-21 ENCOUNTER — Ambulatory Visit (INDEPENDENT_AMBULATORY_CARE_PROVIDER_SITE_OTHER): Payer: BC Managed Care – PPO

## 2021-01-21 DIAGNOSIS — G43009 Migraine without aura, not intractable, without status migrainosus: Secondary | ICD-10-CM

## 2021-01-21 DIAGNOSIS — E538 Deficiency of other specified B group vitamins: Secondary | ICD-10-CM

## 2021-01-21 MED ORDER — CYANOCOBALAMIN 1000 MCG/ML IJ SOLN
1000.0000 ug | Freq: Once | INTRAMUSCULAR | Status: AC
  Administered 2021-01-21: 1000 ug via INTRAMUSCULAR

## 2021-01-22 ENCOUNTER — Other Ambulatory Visit: Payer: Self-pay

## 2021-01-22 MED ORDER — RIZATRIPTAN BENZOATE 10 MG PO TABS: 10.0000 mg | ORAL_TABLET | Freq: Two times a day (BID) | ORAL | 0 refills | Status: DC | PRN

## 2021-02-05 ENCOUNTER — Ambulatory Visit
Admission: RE | Admit: 2021-02-05 | Discharge: 2021-02-05 | Disposition: A | Discharge status: Home / Self Care | Payer: BC Managed Care – PPO | Source: Ambulatory Visit | Attending: Family Medicine | Admitting: Family Medicine

## 2021-02-05 ENCOUNTER — Other Ambulatory Visit: Payer: Self-pay

## 2021-02-05 DIAGNOSIS — B373 Candidiasis of vulva and vagina: Secondary | ICD-10-CM | POA: Diagnosis not present

## 2021-02-05 DIAGNOSIS — Z1231 Encounter for screening mammogram for malignant neoplasm of breast: Secondary | ICD-10-CM | POA: Diagnosis not present

## 2021-03-02 ENCOUNTER — Ambulatory Visit (INDEPENDENT_AMBULATORY_CARE_PROVIDER_SITE_OTHER): Payer: BC Managed Care – PPO

## 2021-03-02 ENCOUNTER — Other Ambulatory Visit: Payer: Self-pay

## 2021-03-02 DIAGNOSIS — E538 Deficiency of other specified B group vitamins: Secondary | ICD-10-CM | POA: Diagnosis not present

## 2021-03-02 MED ORDER — CYANOCOBALAMIN 1000 MCG/ML IJ SOLN
1000.0000 ug | Freq: Once | INTRAMUSCULAR | Status: AC
  Administered 2021-03-02: 1000 ug via INTRAMUSCULAR

## 2021-03-08 ENCOUNTER — Other Ambulatory Visit: Payer: Self-pay | Admitting: Family Medicine

## 2021-03-08 DIAGNOSIS — G43009 Migraine without aura, not intractable, without status migrainosus: Secondary | ICD-10-CM

## 2021-03-08 MED ORDER — RIZATRIPTAN BENZOATE 10 MG PO TABS: 10.0000 mg | ORAL_TABLET | Freq: Two times a day (BID) | ORAL | 0 refills | Status: DC | PRN

## 2021-03-22 ENCOUNTER — Other Ambulatory Visit: Payer: Self-pay

## 2021-03-22 MED ORDER — TRULANCE 3 MG PO TABS: 3.0000 mg | ORAL_TABLET | Freq: Every day | ORAL | 2 refills | Status: DC

## 2021-03-22 NOTE — Telephone Encounter (Signed)
Yes 90 days with 3 refills

## 2021-03-26 ENCOUNTER — Other Ambulatory Visit: Payer: Self-pay

## 2021-03-26 ENCOUNTER — Ambulatory Visit (INDEPENDENT_AMBULATORY_CARE_PROVIDER_SITE_OTHER): Payer: BC Managed Care – PPO | Admitting: Family Medicine

## 2021-03-26 ENCOUNTER — Encounter: Payer: Self-pay | Admitting: Family Medicine

## 2021-03-26 VITALS — BP 112/72 | HR 68 | Temp 97.7°F | Resp 14 | Ht 65.0 in | Wt 125.0 lb

## 2021-03-26 DIAGNOSIS — F5104 Psychophysiologic insomnia: Secondary | ICD-10-CM

## 2021-03-26 DIAGNOSIS — J3089 Other allergic rhinitis: Secondary | ICD-10-CM

## 2021-03-26 DIAGNOSIS — E538 Deficiency of other specified B group vitamins: Secondary | ICD-10-CM

## 2021-03-26 DIAGNOSIS — G43009 Migraine without aura, not intractable, without status migrainosus: Secondary | ICD-10-CM | POA: Diagnosis not present

## 2021-03-26 DIAGNOSIS — J302 Other seasonal allergic rhinitis: Secondary | ICD-10-CM

## 2021-03-26 DIAGNOSIS — F330 Major depressive disorder, recurrent, mild: Secondary | ICD-10-CM

## 2021-03-26 DIAGNOSIS — K5909 Other constipation: Secondary | ICD-10-CM

## 2021-03-26 DIAGNOSIS — J4540 Moderate persistent asthma, uncomplicated: Secondary | ICD-10-CM | POA: Diagnosis not present

## 2021-03-26 DIAGNOSIS — K219 Gastro-esophageal reflux disease without esophagitis: Secondary | ICD-10-CM

## 2021-03-26 DIAGNOSIS — N63 Unspecified lump in unspecified breast: Secondary | ICD-10-CM

## 2021-03-26 MED ORDER — IBUPROFEN 800 MG PO TABS
800.0000 mg | ORAL_TABLET | Freq: Three times a day (TID) | ORAL | 1 refills | Status: DC | PRN
Start: 2021-03-26 — End: 2021-04-24

## 2021-03-26 MED ORDER — LINACLOTIDE 72 MCG PO CAPS: 72.0000 ug | ORAL_CAPSULE | Freq: Every day | ORAL | 5 refills | Status: DC

## 2021-03-26 MED ORDER — BREO ELLIPTA 100-25 MCG/INH IN AEPB: INHALATION_SPRAY | RESPIRATORY_TRACT | 5 refills | Status: DC

## 2021-03-26 MED ORDER — DULOXETINE HCL 30 MG PO CPEP
30.0000 mg | ORAL_CAPSULE | Freq: Every day | ORAL | 0 refills | Status: DC
Start: 2021-03-26 — End: 2021-04-23

## 2021-03-26 MED ORDER — RIZATRIPTAN BENZOATE 10 MG PO TABS: 10.0000 mg | ORAL_TABLET | Freq: Two times a day (BID) | ORAL | 0 refills | Status: DC | PRN

## 2021-03-26 MED ORDER — MONTELUKAST SODIUM 10 MG PO TABS: 10.0000 mg | ORAL_TABLET | Freq: Every day | ORAL | 3 refills | Status: DC

## 2021-03-26 NOTE — Progress Notes (Addendum)
Name: Jessica Carroll   MRN: [id number]    DOB: 1971-11-19   Date:03/26/2021       Progress Note  Subjective  Chief Complaint  Follow up   HPI   Depression/anxiety/Insomnia: phq 9 is still positive and is higher now at 10  She has noticed that she has lack of motivation and feeling down even though stress at home is not as bad as it was. She still gets frustrated with significant other and his son but not as bad as it used to be. She states not sleeping well even on trazodone, falls asleep but wakes up in the middle of the night. She states took some medications in the past that affected her sex drive.   Menopause: she weaned self off estradiol because she was having daily headache and has resolved since she stopped medication, lower dose not help with hot flashes.   Chronic constipation and recently diagnosed with IBS by Dr. Vicente Males, she is now on Trulance and it has helped with bloating and pain   Asthma moderated: she states since allergy season she has noticed increase in chest tightness but no SOB and has a dry cough, sneezing. Taking Breo daily , has been using albuterol twice a month. We will adding singulair, she is already taking zyrtec  GERD:She is now on Protonix BID , dexilant stopped working. No heartburn or indigestion at this time.  History of iron deficiency and B12 deficiency:Symptoms improved since she got iron infusion.Last ferritin was high and she has been off iron supplementation, only taking B12 sublingual   Migraine:episodes are now twice a month, symptoms resolves with Maxalt, sometimes she misses work. Described as throbbing and associated with nausea, also has phonophobia and photophobia  Breast lump: patient noticed breast lump and tenderness on right upper outer quadrant a couple of days ago, she is up to date with mammogram   Patient Active Problem List   Diagnosis Date Noted  . Age-related nuclear cataract of right eye 01/19/2021  . Corneal scar,  right eye 01/19/2021  . Acute bilateral low back pain without sciatica 10/27/2020  . Coronavirus infection 09/25/2019  . Moderate episode of recurrent major depressive disorder ([place]) 12/20/2018  . Iron deficiency anemia 12/19/2018  . Type A blood, Rh positive 08/22/2017  . S/P vaginal hysterectomy 07/31/2017  . Recurrent vaginitis 01/10/2017  . Hemorrhoids, external without complications 02/40/9735  . Nausea in adult patient 02/24/2016  . Endometriosis determined by laparoscopy 01/25/2016  . Chronic constipation 08/14/2015  . Monilial vaginitis 08/14/2015  . Bilateral ganglion cysts of wrists 08/14/2015  . Mild mitral regurgitation 05/19/2015  . B12 deficiency 05/18/2015  . Asthma, moderate 05/18/2015  . Anxiety 05/18/2015  . Insomnia, persistent 05/18/2015  . Eczema of hand 05/18/2015  . Viral keratitis 05/18/2015  . Migraine without aura and without status migrainosus, not intractable 05/18/2015  . Numerous moles 05/18/2015  . Allergic rhinitis 05/18/2015  . History of corneal transplant 05/18/2015  . Gastroesophageal reflux disease without esophagitis 12/16/2009    Past Surgical History:  Procedure Laterality Date  . COLONOSCOPY WITH PROPOFOL N/A 09/29/2017   Procedure: COLONOSCOPY WITH PROPOFOL;  Surgeon: Jonathon Bellows, MD;  Location: [facility] ENDOSCOPY;  Service: Gastroenterology;  Laterality: N/A;  . CORNEAL TRANSPLANT Right 01/07/2013  . ESOPHAGOGASTRODUODENOSCOPY (EGD) WITH PROPOFOL N/A 09/29/2017   Procedure: ESOPHAGOGASTRODUODENOSCOPY (EGD) WITH PROPOFOL;  Surgeon: Jonathon Bellows, MD;  Location: [facility] ENDOSCOPY;  Service: Gastroenterology;  Laterality: N/A;  . EYE SURGERY    . eyelid surgery Right June 19, 2015   [facility] by Dr. Norlene Duel  . LAPAROSCOPIC BILATERAL SALPINGECTOMY N/A 01/25/2016   Procedure: LAPAROSCOPIC BILATERAL SALPINGECTOMY, ADHESIOLYSIS, LAPAROSCOPIC EXCISION/FULGERATION ENDOMETRIOSIS ;  Surgeon: [name], MD;  Location: [facility] ORS;  Service:  Gynecology;  Laterality: N/A;  . Laparoscopic excision and fulguration of endometriosis N/A    bladder flap and uterosacral ligament endometriosis, excised and cauterized  . UPPER GI ENDOSCOPY  07/19/2012  . VAGINAL HYSTERECTOMY N/A 07/31/2017   Procedure: HYSTERECTOMY VAGINAL;  Surgeon: [name], MD;  Location: [facility] ORS;  Service: Gynecology;  Laterality: N/A;    Family History  Adopted: Yes  Problem Relation Age of Onset  . Hyperlipidemia Mother   . Hypertension Mother   . Diabetes Father   . Heart failure Father 39       hear attack  . Cancer Neg Hx   . Breast cancer Neg Hx     Social History   Tobacco Use  . Smoking status: Never Smoker  . Smokeless tobacco: Never Used  Substance Use Topics  . Alcohol use: Not Currently    Alcohol/week: 0.0 standard drinks    Comment: occ. wine     Current Outpatient Medications:  .  albuterol (PROVENTIL HFA) 108 (90 Base) MCG/ACT inhaler, Inhale 2 puffs into the lungs every 6 (six) hours as needed for wheezing or shortness of breath., Disp: 18 each, Rfl: 0 .  ALPRAZolam (XANAX) 0.5 MG tablet, Take 1 tablet (0.5 mg total) by mouth 2 (two) times daily as needed for anxiety., Disp: 10 tablet, Rfl: 0 .  carboxymethylcellul-glycerin (REFRESH OPTIVE) 0.5-0.9 % ophthalmic solution, Frequency:PHARMDIR   Dosage:0.0     Instructions:  Note:6 x OU Dose: 0.5%-0.9%, Disp: , Rfl:  .  cetirizine (ZYRTEC) 5 MG tablet, Take by mouth., Disp: , Rfl:  .  conjugated estrogens (PREMARIN) vaginal cream, Place 1 Applicatorful vaginally daily. For the first 2 weeks after that a few times a week - pea size on urethra and vaginal entry only, Disp: 42.5 g, Rfl: 12 .  fluconazole (DIFLUCAN) 150 MG tablet, TAKE 1 TABLET BY MOUTH. MAY REPEAT IN 1 WEEK, Disp: , Rfl:  .  fluticasone furoate-vilanterol (BREO ELLIPTA) 100-25 MCG/INH AEPB, INHALE 1 PUFF INTO LUNGS ONCE DAILY (RINSE, GARGLE & SPIT AFTER USE), Disp: 60 each, Rfl: 5 .  ibuprofen (ADVIL) 800 MG  tablet, Take 1 tablet (800 mg total) by mouth every 8 (eight) hours as needed., Disp: 30 tablet, Rfl: 1 .  MULTIPLE VITAMIN PO, Take 1 tablet by mouth daily., Disp: , Rfl:  .  pantoprazole (PROTONIX) 40 MG tablet, Take 1 tablet (40 mg total) by mouth 2 (two) times daily before a meal., Disp: 180 tablet, Rfl: 1 .  Plecanatide (TRULANCE) 3 MG TABS, Take 3 mg by mouth daily., Disp: 30 tablet, Rfl: 2 .  prednisoLONE acetate (PRED FORTE) 1 % ophthalmic suspension, Place 1 drop into the right eye daily., Disp: , Rfl:  .  promethazine (PHENERGAN) 12.5 MG tablet, Take 1 tablet (12.5 mg total) by mouth every 8 (eight) hours as needed for nausea or vomiting., Disp: 20 tablet, Rfl: 0 .  rizatriptan (MAXALT) 10 MG tablet, Take 1 tablet (10 mg total) by mouth 2 (two) times daily as needed., Disp: 10 tablet, Rfl: 0 .  sodium chloride (OCEAN) 0.65 % nasal spray, Place into the nose., Disp: , Rfl:  .  traZODone (DESYREL) 50 MG tablet, Take 0.5-1 tablets (25-50 mg total) by mouth at bedtime as needed for sleep.,  Disp: 90 tablet, Rfl: 1 .  triamcinolone (KENALOG) 0.1 %, Apply 1 application topically 2 (two) times daily as needed., Disp: 80 g, Rfl: 0 .  triamcinolone (NASACORT) 55 MCG/ACT AERO nasal inhaler, Place 2 sprays into the nose daily., Disp: 1 Inhaler, Rfl: 12 .  valACYclovir (VALTREX) 500 MG tablet, Take 1 tablet by mouth 2 (two) times daily., Disp: , Rfl: 0 .  estradiol (ESTRACE) 1 MG tablet, Take 1.5 tablets (1.5 mg total) by mouth daily. (Patient not taking: Reported on 03/26/2021), Disp: 135 tablet, Rfl: 1  Allergies  Allergen Reactions  . Bactrim [Sulfamethoxazole-Trimethoprim] Nausea Only  . Penicillins Nausea Only    I personally reviewed active problem list, medication list, allergies, family history, social history with the patient/caregiver today.   ROS  Constitutional: Negative for fever or weight change.  Respiratory: Negative for cough and shortness of breath.   Cardiovascular: Negative  for chest pain or palpitations.  Gastrointestinal: Negative for abdominal pain, no bowel changes.  Musculoskeletal: Negative for gait problem or joint swelling.  Skin: Negative for rash.  Neurological: Negative for dizziness or headache.  No other specific complaints in a complete review of systems (except as listed in HPI above).  Objective  Vitals:   03/26/21 1010  BP: 112/72  Pulse: 68  Resp: 14  Temp: 97.7 F (36.5 C)  TempSrc: Oral  SpO2: 99%  Weight: 125 lb (56.7 kg)  Height: 5\' 5"  (1.651 m)    Body mass index is 20.8 kg/m.  Physical Exam  Constitutional: Patient appears well-developed and well-nourished.  No distress.  HEENT: head atraumatic, normocephalic, pupils equal and reactive to light, neck supple Cardiovascular: Normal rate, regular rhythm and normal heart sounds.  No murmur heard. No BLE edema. Breast: normal left breast exam, tender and large nodules, firm at 11 and 12 o'clock right breast, no axillary lymphadenopathy or nipple discharge  Pulmonary/Chest: Effort normal and breath sounds normal. No respiratory distress. Abdominal: Soft.  There is no tenderness. Psychiatric: Patient has a normal mood and affect. behavior is normal. Judgment and thought content normal.   PHQ2/9: Depression screen [facility] 2/9 03/26/2021 12/25/2020 10/27/2020 08/07/2020 04/27/2020  Decreased Interest 2 1 1 1  0  Down, Depressed, Hopeless 2 1 2 1  0  PHQ - 2 Score 4 2 3 2  0  Altered sleeping 2 2 1 1 3   Tired, decreased energy 2 2 1 1 3   Change in appetite 2 0 1 2 0  Feeling bad or failure about yourself  0 0 0 0 0  Trouble concentrating 0 0 0 0 0  Moving slowly or fidgety/restless 0 0 0 0 0  Suicidal thoughts 0 0 0 0 0  PHQ-9 Score 10 6 6 6 6   Difficult doing work/chores Somewhat difficult Not difficult at all Somewhat difficult Not difficult at all Not difficult at all  Some recent data might be hidden    phq 9 is positive  Fall Risk: Fall Risk  03/26/2021 12/25/2020 04/27/2020  01/03/2020 12/24/2019  Falls in the past year? 0 0 0 0 0  Number falls in past yr: 0 0 0 0 0  Injury with Fall? - 0 0 0 0  Risk for fall due to : - - - - -  Follow up Falls prevention discussed - - Falls evaluation completed -     Functional Status Survey: Is the patient deaf or have difficulty hearing?: No Does the patient have difficulty seeing, even when wearing glasses/contacts?: No Does the patient have  difficulty concentrating, remembering, or making decisions?: No Does the patient have difficulty walking or climbing stairs?: No Does the patient have difficulty dressing or bathing?: No Does the patient have difficulty doing errands alone such as visiting a doctor's office or shopping?: No   Assessment & Plan  1. Moderate persistent asthma without complication  - fluticasone furoate-vilanterol (BREO ELLIPTA) 100-25 MCG/INH AEPB; INHALE 1 PUFF INTO LUNGS ONCE DAILY (RINSE, GARGLE & SPIT AFTER USE)  Dispense: 60 each; Refill: 5 - montelukast (SINGULAIR) 10 MG tablet; Take 1 tablet (10 mg total) by mouth at bedtime.  Dispense: 30 tablet; Refill: 3  2. Perennial allergic rhinitis with seasonal variation  - montelukast (SINGULAIR) 10 MG tablet; Take 1 tablet (10 mg total) by mouth at bedtime.  Dispense: 30 tablet; Refill: 3  3. B12 deficiency   4. Migraine without aura and without status migrainosus, not intractable  - rizatriptan (MAXALT) 10 MG tablet; Take 1 tablet (10 mg total) by mouth 2 (two) times daily as needed.  Dispense: 10 tablet; Refill: 0  5. Gastroesophageal reflux disease without esophagitis   6. Mild episode of recurrent major depressive disorder (HCC)  - DULoxetine (CYMBALTA) 30 MG capsule; Take 1-2 capsules (30-60 mg total) by mouth daily. Start one daily and after that 2 daily  Dispense: 60 capsule; Refill: 0  7. Chronic constipation  - linaclotide (LINZESS) 72 MCG capsule; Take 1 capsule (72 mcg total) by mouth daily before breakfast.  Dispense: 30  capsule; Refill: 5  8. Psychophysiological insomnia  Take 50 mg of trazodone instead of half pill  9. Breast lump in female  - US BREAST LTD UNI RIGHT INC AXILLA; Future

## 2021-03-26 NOTE — Addendum Note (Signed)
Addended by: Steele Sizer F on: 03/26/2021 10:49 AM   Modules accepted: Orders

## 2021-03-30 ENCOUNTER — Other Ambulatory Visit: Payer: Self-pay | Admitting: Family Medicine

## 2021-03-30 ENCOUNTER — Telehealth: Payer: Self-pay | Admitting: Family Medicine

## 2021-03-30 DIAGNOSIS — N63 Unspecified lump in unspecified breast: Secondary | ICD-10-CM

## 2021-03-30 NOTE — Telephone Encounter (Signed)
Pt is calling and she needs diagnostic right breast mammogram before they will sch her for ultrasound. Pt seen dr Ancil Boozer on 03-26-2021

## 2021-04-06 ENCOUNTER — Ambulatory Visit
Admission: RE | Admit: 2021-04-06 | Discharge: 2021-04-06 | Disposition: A | Discharge status: Home / Self Care | Payer: 59 | Source: Ambulatory Visit | Attending: Family Medicine | Admitting: Family Medicine

## 2021-04-06 ENCOUNTER — Other Ambulatory Visit: Payer: Self-pay

## 2021-04-06 ENCOUNTER — Ambulatory Visit (INDEPENDENT_AMBULATORY_CARE_PROVIDER_SITE_OTHER): Payer: 59

## 2021-04-06 DIAGNOSIS — E538 Deficiency of other specified B group vitamins: Secondary | ICD-10-CM | POA: Diagnosis not present

## 2021-04-06 DIAGNOSIS — N63 Unspecified lump in unspecified breast: Secondary | ICD-10-CM

## 2021-04-06 MED ORDER — CYANOCOBALAMIN 1000 MCG/ML IJ SOLN
1000.0000 ug | Freq: Once | INTRAMUSCULAR | Status: AC
  Administered 2021-04-06: 1000 ug via INTRAMUSCULAR

## 2021-04-23 ENCOUNTER — Other Ambulatory Visit: Payer: Self-pay

## 2021-04-23 ENCOUNTER — Ambulatory Visit: Payer: 59 | Admitting: Family Medicine

## 2021-04-23 ENCOUNTER — Encounter: Payer: Self-pay | Admitting: Family Medicine

## 2021-04-23 VITALS — BP 120/70 | HR 75 | Temp 98.2°F | Resp 16 | Ht 65.0 in | Wt 123.7 lb

## 2021-04-23 DIAGNOSIS — M549 Dorsalgia, unspecified: Secondary | ICD-10-CM | POA: Diagnosis not present

## 2021-04-23 DIAGNOSIS — M62830 Muscle spasm of back: Secondary | ICD-10-CM

## 2021-04-23 LAB — POCT URINALYSIS DIPSTICK
Bilirubin, UA: NEGATIVE
Blood, UA: NEGATIVE
Glucose, UA: NEGATIVE
Ketones, UA: NEGATIVE
Leukocytes, UA: NEGATIVE
Nitrite, UA: NEGATIVE
Protein, UA: NEGATIVE
Spec Grav, UA: 1.01 (ref 1.010–1.025)
Urobilinogen, UA: 0.2 E.U./dL
pH, UA: 5 (ref 5.0–8.0)

## 2021-04-23 MED ORDER — PREDNISONE 20 MG PO TABS: 40.0000 mg | ORAL_TABLET | Freq: Every day | ORAL | 0 refills | Status: DC

## 2021-04-23 MED ORDER — CYCLOBENZAPRINE HCL 5 MG PO TABS: 5.0000 mg | ORAL_TABLET | Freq: Three times a day (TID) | ORAL | 0 refills | Status: DC | PRN

## 2021-04-23 MED ORDER — PREDNISONE 20 MG PO TABS: 40.0000 mg | ORAL_TABLET | Freq: Every day | ORAL | 0 refills | Status: AC

## 2021-04-23 NOTE — Patient Instructions (Signed)
Acute Back Pain, Adult Acute back pain is sudden and usually short-lived. It is often caused by an injury to the muscles and tissues in the back. The injury may result from:  A muscle or ligament getting overstretched or torn (strained). Ligaments are tissues that connect bones to each other. Lifting something improperly can cause a back strain.  Wear and tear (degeneration) of the spinal disks. Spinal disks are circular tissue that provide cushioning between the bones of the spine (vertebrae).  Twisting motions, such as while playing sports or doing yard work.  A hit to the back.  Arthritis. You may have a physical exam, lab tests, and imaging tests to find the cause of your pain. Acute back pain usually goes away with rest and home care. Follow these instructions at home: Managing pain, stiffness, and swelling  Treatment may include medicines for pain and inflammation that are taken by mouth or applied to the skin, prescription pain medicine, or muscle relaxants. Take over-the-counter and prescription medicines only as told by your health care provider.  Your health care provider may recommend applying ice during the first 24-48 hours after your pain starts. To do this: ? Put ice in a plastic bag. ? Place a towel between your skin and the bag. ? Leave the ice on for 20 minutes, 2-3 times a day.  If directed, apply heat to the affected area as often as told by your health care provider. Use the heat source that your health care provider recommends, such as a moist heat pack or a heating pad. ? Place a towel between your skin and the heat source. ? Leave the heat on for 20-30 minutes. ? Remove the heat if your skin turns bright red. This is especially important if you are unable to feel pain, heat, or cold. You have a greater risk of getting burned. Activity  Do not stay in bed. Staying in bed for more than 1-2 days can delay your recovery.  Sit up and stand up straight. Avoid leaning  forward when you sit or hunching over when you stand. ? If you work at a desk, sit close to it so you do not need to lean over. Keep your chin tucked in. Keep your neck drawn back, and keep your elbows bent at a 90-degree angle (right angle). ? Sit high and close to the steering wheel when you drive. Add lower back (lumbar) support to your car seat, if needed.  Take short walks on even surfaces as soon as you are able. Try to increase the length of time you walk each day.  Do not sit, drive, or stand in one place for more than 30 minutes at a time. Sitting or standing for long periods of time can put stress on your back.  Do not drive or use heavy machinery while taking prescription pain medicine.  Use proper lifting techniques. When you bend and lift, use positions that put less stress on your back: ? Bend your knees. ? Keep the load close to your body. ? Avoid twisting.  Exercise regularly as told by your health care provider. Exercising helps your back heal faster and helps prevent back injuries by keeping muscles strong and flexible.  Work with a physical therapist to make a safe exercise program, as recommended by your health care provider. Do any exercises as told by your physical therapist.   Lifestyle  Maintain a healthy weight. Extra weight puts stress on your back and makes it difficult to have   good posture.  Avoid activities or situations that make you feel anxious or stressed. Stress and anxiety increase muscle tension and can make back pain worse. Learn ways to manage anxiety and stress, such as through exercise. General instructions  Sleep on a firm mattress in a comfortable position. Try lying on your side with your knees slightly bent. If you lie on your back, put a pillow under your knees.  Follow your treatment plan as told by your health care provider. This may include: ? Cognitive or behavioral therapy. ? Acupuncture or massage therapy. ? Meditation or yoga. Contact  a health care provider if:  You have pain that is not relieved with rest or medicine.  You have increasing pain going down into your legs or buttocks.  Your pain does not improve after 2 weeks.  You have pain at night.  You lose weight without trying.  You have a fever or chills. Get help right away if:  You develop new bowel or bladder control problems.  You have unusual weakness or numbness in your arms or legs.  You develop nausea or vomiting.  You develop abdominal pain.  You feel faint. Summary  Acute back pain is sudden and usually short-lived.  Use proper lifting techniques. When you bend and lift, use positions that put less stress on your back.  Take over-the-counter and prescription medicines and apply heat or ice as directed by your health care provider. This information is not intended to replace advice given to you by your health care provider. Make sure you discuss any questions you have with your health care provider. Document Revised: 08/21/2020 Document Reviewed: 08/21/2020 [employer] Patient Education  2021 [employer]  

## 2021-04-23 NOTE — Progress Notes (Signed)
Patient ID: Jessica Carroll, female    DOB: 26-Dec-1970, 50 y.o.   MRN: [id number]  PCP: Steele Sizer, MD  Chief Complaint  Patient presents with  . Back Pain    Subjective:   Jessica Carroll is a 50 y.o. female, presents to clinic with CC of the following:  Hx of back pain similar to this about once a year, usually resolves after getting steroids and muscle relaxers.  Onset of pain yesterday after bending over but she didn't do anything strenuous and no trauma.   No past PT eval/tx or Xrays with episodes in the past  Back Pain This is a recurrent problem. The current episode started yesterday. The problem occurs constantly. The problem is unchanged. The pain is present in the thoracic spine. The pain does not radiate. The pain is moderate (moderate to severe). The pain is the same all the time. The symptoms are aggravated by bending (movement and position changes). Pertinent negatives include no abdominal pain, bladder incontinence, bowel incontinence, chest pain, fever, headaches, leg pain, numbness, paresis, paresthesias, pelvic pain, perianal numbness, tingling or weakness. (No associated pleuritic CP, hematuria, dysuria, N, V) She has tried NSAIDs and ice (rest) for the symptoms. The treatment provided mild relief.    Left mid back pain sudden onset after bending over to tie her     Results for orders placed or performed in visit on 04/23/21  POCT urinalysis dipstick  Result Value Ref Range   Color, UA yellow    Clarity, UA clear    Glucose, UA Negative Negative   Bilirubin, UA neg    Ketones, UA neg    Spec Grav, UA 1.010 1.010 - 1.025   Blood, UA neg    pH, UA 5.0 5.0 - 8.0   Protein, UA Negative Negative   Urobilinogen, UA 0.2 0.2 or 1.0 E.U./dL   Nitrite, UA neg    Leukocytes, UA Negative Negative   Appearance yellow    Odor clear    Hx of GERD was on dexilant in the past and currently on protonix 40 mg BID  She has taken ibuprofen 800 mg w/o much  difference in pain No radiation of pain, tingling, electrical pain, numbness   Patient Active Problem List   Diagnosis Date Noted  . Age-related nuclear cataract of right eye 01/19/2021  . Corneal scar, right eye 01/19/2021  . Acute bilateral low back pain without sciatica 10/27/2020  . Coronavirus infection 09/25/2019  . Moderate episode of recurrent major depressive disorder ([place]) 12/20/2018  . Iron deficiency anemia 12/19/2018  . Type A blood, Rh positive 08/22/2017  . S/P vaginal hysterectomy 07/31/2017  . Recurrent vaginitis 01/10/2017  . Hemorrhoids, external without complications 35/70/1779  . Nausea in adult patient 02/24/2016  . Endometriosis determined by laparoscopy 01/25/2016  . Chronic constipation 08/14/2015  . Monilial vaginitis 08/14/2015  . Bilateral ganglion cysts of wrists 08/14/2015  . Mild mitral regurgitation 05/19/2015  . B12 deficiency 05/18/2015  . Asthma, moderate 05/18/2015  . Anxiety 05/18/2015  . Insomnia, persistent 05/18/2015  . Eczema of hand 05/18/2015  . Viral keratitis 05/18/2015  . Migraine without aura and without status migrainosus, not intractable 05/18/2015  . Numerous moles 05/18/2015  . Allergic rhinitis 05/18/2015  . History of corneal transplant 05/18/2015  . Gastroesophageal reflux disease without esophagitis 12/16/2009      Current Outpatient Medications:  .  albuterol (PROVENTIL HFA) 108 (90 Base) MCG/ACT inhaler, Inhale 2 puffs into the lungs every 6 (six)  hours as needed for wheezing or shortness of breath., Disp: 18 each, Rfl: 0 .  ALPRAZolam (XANAX) 0.5 MG tablet, Take 1 tablet (0.5 mg total) by mouth 2 (two) times daily as needed for anxiety., Disp: 10 tablet, Rfl: 0 .  carboxymethylcellul-glycerin (REFRESH OPTIVE) 0.5-0.9 % ophthalmic solution, Frequency:PHARMDIR   Dosage:0.0     Instructions:  Note:6 x OU Dose: 0.5%-0.9%, Disp: , Rfl:  .  conjugated estrogens (PREMARIN) vaginal cream, Place 1 Applicatorful vaginally daily.  For the first 2 weeks after that a few times a week - pea size on urethra and vaginal entry only, Disp: 42.5 g, Rfl: 12 .  fluticasone furoate-vilanterol (BREO ELLIPTA) 100-25 MCG/INH AEPB, INHALE 1 PUFF INTO LUNGS ONCE DAILY (RINSE, GARGLE & SPIT AFTER USE), Disp: 60 each, Rfl: 5 .  ibuprofen (ADVIL) 800 MG tablet, Take 1 tablet (800 mg total) by mouth every 8 (eight) hours as needed., Disp: 30 tablet, Rfl: 1 .  linaclotide (LINZESS) 72 MCG capsule, Take 1 capsule (72 mcg total) by mouth daily before breakfast., Disp: 30 capsule, Rfl: 5 .  montelukast (SINGULAIR) 10 MG tablet, Take 1 tablet (10 mg total) by mouth at bedtime., Disp: 30 tablet, Rfl: 3 .  MULTIPLE VITAMIN PO, Take 1 tablet by mouth daily., Disp: , Rfl:  .  pantoprazole (PROTONIX) 40 MG tablet, Take 1 tablet (40 mg total) by mouth 2 (two) times daily before a meal., Disp: 180 tablet, Rfl: 1 .  prednisoLONE acetate (PRED FORTE) 1 % ophthalmic suspension, Place 1 drop into the right eye daily., Disp: , Rfl:  .  promethazine (PHENERGAN) 12.5 MG tablet, Take 1 tablet (12.5 mg total) by mouth every 8 (eight) hours as needed for nausea or vomiting., Disp: 20 tablet, Rfl: 0 .  rizatriptan (MAXALT) 10 MG tablet, Take 1 tablet (10 mg total) by mouth 2 (two) times daily as needed., Disp: 10 tablet, Rfl: 0 .  sodium chloride (OCEAN) 0.65 % nasal spray, Place into the nose., Disp: , Rfl:  .  traZODone (DESYREL) 50 MG tablet, Take 0.5-1 tablets (25-50 mg total) by mouth at bedtime as needed for sleep., Disp: 90 tablet, Rfl: 1 .  triamcinolone (KENALOG) 0.1 %, Apply 1 application topically 2 (two) times daily as needed., Disp: 80 g, Rfl: 0 .  valACYclovir (VALTREX) 500 MG tablet, Take 1 tablet by mouth 2 (two) times daily., Disp: , Rfl: 0 .  cetirizine (ZYRTEC) 5 MG tablet, Take by mouth., Disp: , Rfl:  .  DULoxetine (CYMBALTA) 30 MG capsule, Take 1-2 capsules (30-60 mg total) by mouth daily. Start one daily and after that 2 daily (Patient not taking:  Reported on 04/23/2021), Disp: 60 capsule, Rfl: 0   Allergies  Allergen Reactions  . Bactrim [Sulfamethoxazole-Trimethoprim] Nausea Only  . Penicillins Nausea Only     Social History   Tobacco Use  . Smoking status: Never Smoker  . Smokeless tobacco: Never Used  Vaping Use  . Vaping Use: Never used  Substance Use Topics  . Alcohol use: Not Currently    Alcohol/week: 0.0 standard drinks    Comment: occ. wine  . Drug use: No      Chart Review Today: I personally reviewed active problem list, medication list, allergies, family history, social history, health maintenance, notes from last encounter, lab results, imaging with the patient/caregiver today.   Review of Systems  Constitutional: Negative.  Negative for activity change, appetite change, chills, diaphoresis, fatigue and fever.  HENT: Negative.   Eyes: Negative.  Respiratory: Negative.  Negative for cough, chest tightness, shortness of breath and wheezing.   Cardiovascular: Negative.  Negative for chest pain.  Gastrointestinal: Negative.  Negative for abdominal pain and bowel incontinence.  Endocrine: Negative.   Genitourinary: Negative.  Negative for bladder incontinence, decreased urine volume, difficulty urinating, dyspareunia, pelvic pain, vaginal bleeding, vaginal discharge and vaginal pain.  Musculoskeletal: Positive for back pain.  Skin: Negative.   Allergic/Immunologic: Negative.   Neurological: Negative.  Negative for tingling, weakness, numbness, headaches and paresthesias.  Hematological: Negative.   Psychiatric/Behavioral: Negative.   All other systems reviewed and are negative.      Objective:   Vitals:   04/23/21 0836  BP: 120/70  Pulse: 75  Resp: 16  Temp: 98.2 F (36.8 C)  SpO2: 99%  Weight: 123 lb 11.2 oz (56.1 kg)  Height: 5\' 5"  (1.651 m)    Body mass index is 20.58 kg/m.  Physical Exam Vitals and nursing note reviewed.  Constitutional:      General: She is not in acute  distress.    Appearance: Normal appearance. She is well-developed. She is not ill-appearing, toxic-appearing or diaphoretic.     Interventions: Face mask in place.     Comments: Appears well but uncomfortable  HENT:     Head: Normocephalic and atraumatic.     Right Ear: External ear normal.     Left Ear: External ear normal.  Eyes:     General: Lids are normal. No scleral icterus.       Right eye: No discharge.        Left eye: No discharge.     Conjunctiva/sclera: Conjunctivae normal.  Neck:     Trachea: Phonation normal. No tracheal deviation.  Cardiovascular:     Rate and Rhythm: Normal rate and regular rhythm.     Pulses: Normal pulses.          Radial pulses are 2+ on the right side and 2+ on the left side.       Posterior tibial pulses are 2+ on the right side and 2+ on the left side.     Heart sounds: Normal heart sounds.  Pulmonary:     Effort: Pulmonary effort is normal. No respiratory distress.     Breath sounds: Normal breath sounds. No stridor. No wheezing, rhonchi or rales.  Chest:     Chest wall: No tenderness.  Abdominal:     General: Bowel sounds are normal.     Palpations: Abdomen is soft.     Tenderness: There is no abdominal tenderness. There is no right CVA tenderness, left CVA tenderness or guarding.  Musculoskeletal:     Right lower leg: No edema.     Left lower leg: No edema.     Comments: No midline tenderness from cervical to lumbar spine, no step off No paraspinal muscle ttp to cervical and lumbar spine, mild low left thoracic paraspinal muscle ttp Grossly normal ROM of back 5/5 strength bilaterally with dorsiflexion, plantarflexion, flexion and extension at knees, and flexion and extension at hips Grossly normal sensation to light touch to bilateral lower extremities normal gait   Skin:    General: Skin is warm and dry.     Coloration: Skin is not jaundiced or pale.     Findings: No rash.  Neurological:     Mental Status: She is alert.      Motor: No abnormal muscle tone.     Gait: Gait normal.  Psychiatric:  Mood and Affect: Mood normal.        Speech: Speech normal.        Behavior: Behavior normal.      Results for orders placed or performed in visit on 04/23/21  POCT urinalysis dipstick  Result Value Ref Range   Color, UA yellow    Clarity, UA clear    Glucose, UA Negative Negative   Bilirubin, UA neg    Ketones, UA neg    Spec Grav, UA 1.010 1.010 - 1.025   Blood, UA neg    pH, UA 5.0 5.0 - 8.0   Protein, UA Negative Negative   Urobilinogen, UA 0.2 0.2 or 1.0 E.U./dL   Nitrite, UA neg    Leukocytes, UA Negative Negative   Appearance yellow    Odor clear        Assessment & Plan:       ICD-10-CM   1. Back pain, unspecified back location, unspecified back pain laterality, unspecified chronicity  M54.9 POCT urinalysis dipstick   suspect muscle strain, started yesterday, conservative management, encouraged rest, heat tx, NSAIDs if tolerated, tylenol, PT may be helpful with her hx  2. Spasm of muscle of lower back  M62.830 cyclobenzaprine (FLEXERIL) 5 MG tablet    DISCONTINUED: cyclobenzaprine (FLEXERIL) 5 MG tablet   refill on muscle relaxers that has been effective in the past per PCP and chart review    Back pain x 1 d, reproducible on exam, no red flags, recurrent per pt hx, I have recommended she get PT eval and tx and possibly screening spine Xrays if pain does not gradually improve over the next 3-4 weeks. I have explained to her that I doubt prednisone will be helpful right now - she has no radicular sx and I am concerned it will have more SE than benefits to her - esp with her GI hx, she verbalized understanding and asked for them anyway due to hx and going to the beach next week. Reviewed conservative management, red flags and recommended f/up, hand out given, pt verbalized understanding and plan.  Return for as needed in .    Delsa Grana, PA-C 04/23/21 9:11 AM

## 2021-04-24 ENCOUNTER — Other Ambulatory Visit: Payer: Self-pay | Admitting: Family Medicine

## 2021-04-24 DIAGNOSIS — G43009 Migraine without aura, not intractable, without status migrainosus: Secondary | ICD-10-CM

## 2021-04-24 NOTE — Telephone Encounter (Signed)
Requested Prescriptions  Pending Prescriptions Disp Refills  . ibuprofen (ADVIL) 800 MG tablet [Pharmacy Med Name: IBUPROFEN 800 MG TAB] 30 tablet 0    Sig: TAKE 1 TABLET BY MOUTH EVERY 8 HOURS AS NEEDED     Analgesics:  NSAIDS Failed - 04/24/2021 10:10 AM      Failed - HGB in normal range and within 360 days    Hemoglobin  Date Value Ref Range Status  12/25/2020 11.6 (L) 11.7 - 15.5 g/dL Final  12/24/2019 12.2 11.1 - 15.9 g/dL Final         Passed - Cr in normal range and within 360 days    Creat  Date Value Ref Range Status  12/25/2020 0.68 0.50 - 1.05 mg/dL Final    Comment:    For patients >47 years of age, the reference limit for Creatinine is approximately 13% higher for people identified as African-American. Renella Cunas - Patient is not pregnant      Passed - Valid encounter within last 12 months    Recent Outpatient Visits          Yesterday Back pain, unspecified back location, unspecified back pain laterality, unspecified chronicity   [facility] Delsa Grana, PA-C   4 weeks ago Moderate persistent asthma without complication   [facility] [place], Drue Stager, MD   4 months ago Migraine without aura and without status migrainosus, not intractable   [facility], Drue Stager, MD   5 months ago Acute bilateral low back pain without sciatica   [facility] Lebron Conners D, MD   8 months ago B12 deficiency   [facility] Steele Sizer, MD      Future Appointments            In 2 months Ancil Boozer, Drue Stager, MD [facility] [place]   In 8 months Steele Sizer, MD [facility], [facility]

## 2021-05-04 ENCOUNTER — Ambulatory Visit: Payer: 59

## 2021-05-05 ENCOUNTER — Encounter: Payer: Self-pay | Admitting: Family Medicine

## 2021-05-06 ENCOUNTER — Ambulatory Visit (INDEPENDENT_AMBULATORY_CARE_PROVIDER_SITE_OTHER): Payer: 59

## 2021-05-06 ENCOUNTER — Other Ambulatory Visit: Payer: Self-pay

## 2021-05-06 DIAGNOSIS — E538 Deficiency of other specified B group vitamins: Secondary | ICD-10-CM

## 2021-05-06 MED ORDER — CYANOCOBALAMIN 1000 MCG/ML IJ SOLN
1000.0000 ug | Freq: Once | INTRAMUSCULAR | Status: AC
  Administered 2021-05-06: 1000 ug via INTRAMUSCULAR

## 2021-05-07 ENCOUNTER — Ambulatory Visit: Payer: 59

## 2021-05-17 ENCOUNTER — Ambulatory Visit: Payer: 59 | Admitting: Unknown Physician Specialty

## 2021-05-17 ENCOUNTER — Encounter: Payer: Self-pay | Admitting: Unknown Physician Specialty

## 2021-05-17 ENCOUNTER — Other Ambulatory Visit: Payer: Self-pay

## 2021-05-17 VITALS — BP 120/72 | HR 81 | Temp 98.2°F | Resp 16 | Ht 65.0 in | Wt 124.8 lb

## 2021-05-17 DIAGNOSIS — J069 Acute upper respiratory infection, unspecified: Secondary | ICD-10-CM | POA: Diagnosis not present

## 2021-05-17 DIAGNOSIS — Z1152 Encounter for screening for COVID-19: Secondary | ICD-10-CM | POA: Diagnosis not present

## 2021-05-17 NOTE — Patient Instructions (Signed)
Pt with symptoms consistent with viral.  Will get Covid PCR today.  Recoomend  Zinc twice a day Vit C 3x/day Vit D 800 IUs Elderberry  Quercetin twice a day

## 2021-05-17 NOTE — Progress Notes (Signed)
BP 120/72   Pulse 81   Temp 98.2 F (36.8 C) (Oral)   Resp 16   Ht 5\' 5"  (1.651 m)   Wt 124 lb 12.8 oz (56.6 kg)   LMP 07/11/2017 (Exact Date) Comment: patient still has her ovaries  SpO2 99%   BMI 20.77 kg/m    Subjective:    Patient ID: Jessica Carroll, female    DOB: 11-03-1971, 50 y.o.   MRN: [id number]  HPI: Jessica Carroll is a 50 y.o. female  Chief Complaint  Patient presents with  . URI    Cough, congested scratchy throat. At home Covid test negative   URI  This is a new problem. The current episode started in the past 7 days. The problem has been waxing and waning. There has been no fever. Associated symptoms include congestion, coughing, rhinorrhea and a sore throat. Pertinent negatives include no abdominal pain, chest pain, diarrhea, dysuria, ear pain, headaches, joint pain, joint swelling or nausea. She has tried antihistamine and decongestant for the symptoms. The treatment provided no relief.     Relevant past medical, surgical, family and social history reviewed and updated as indicated. Interim medical history since our last visit reviewed. Allergies and medications reviewed and updated.  Review of Systems  HENT: Positive for congestion, rhinorrhea and sore throat. Negative for ear pain.   Respiratory: Positive for cough.   Cardiovascular: Negative for chest pain.  Gastrointestinal: Negative for abdominal pain, diarrhea and nausea.  Genitourinary: Negative for dysuria.  Musculoskeletal: Negative for joint pain.  Neurological: Negative for headaches.    Per HPI unless specifically indicated above     Objective:    BP 120/72   Pulse 81   Temp 98.2 F (36.8 C) (Oral)   Resp 16   Ht 5\' 5"  (1.651 m)   Wt 124 lb 12.8 oz (56.6 kg)   LMP 07/11/2017 (Exact Date) Comment: patient still has her ovaries  SpO2 99%   BMI 20.77 kg/m   Wt Readings from Last 3 Encounters:  05/17/21 124 lb 12.8 oz (56.6 kg)  04/23/21 123 lb 11.2 oz (56.1 kg)  03/26/21  125 lb (56.7 kg)    Physical Exam Constitutional:      General: She is not in acute distress.    Appearance: Normal appearance. She is well-developed.  HENT:     Head: Normocephalic and atraumatic.     Right Ear: Tympanic membrane and ear canal normal.     Left Ear: Tympanic membrane and ear canal normal.     Nose: Rhinorrhea present.     Right Sinus: No maxillary sinus tenderness or frontal sinus tenderness.     Left Sinus: No maxillary sinus tenderness or frontal sinus tenderness.     Mouth/Throat:     Pharynx: Posterior oropharyngeal erythema present.  Eyes:     General: Lids are normal. No scleral icterus.       Right eye: No discharge.        Left eye: No discharge.     Conjunctiva/sclera: Conjunctivae normal.  Cardiovascular:     Rate and Rhythm: Normal rate and regular rhythm.  Pulmonary:     Effort: Pulmonary effort is normal. No respiratory distress.     Breath sounds: Normal breath sounds.  Abdominal:     Palpations: There is no hepatomegaly or splenomegaly.  Musculoskeletal:        General: Normal range of motion.  Skin:    Coloration: Skin is not pale.  Findings: No rash.  Neurological:     Mental Status: She is alert and oriented to person, place, and time.  Psychiatric:        Behavior: Behavior normal.        Thought Content: Thought content normal.        Judgment: Judgment normal.     Results for orders placed or performed in visit on 04/23/21  POCT urinalysis dipstick  Result Value Ref Range   Color, UA yellow    Clarity, UA clear    Glucose, UA Negative Negative   Bilirubin, UA neg    Ketones, UA neg    Spec Grav, UA 1.010 1.010 - 1.025   Blood, UA neg    pH, UA 5.0 5.0 - 8.0   Protein, UA Negative Negative   Urobilinogen, UA 0.2 0.2 or 1.0 E.U./dL   Nitrite, UA neg    Leukocytes, UA Negative Negative   Appearance yellow    Odor clear       Assessment & Plan:   Problem List Items Addressed This Visit   None   Visit Diagnoses     Viral upper respiratory tract infection    -  Primary   Pt with symptoms consistent with viral.  Will get Covid PCR today.  Recoomend Zinc, Vit C, Vit D,  Elderberry, Quercetin.         Follow up plan: Return if symptoms worsen or fail to improve.

## 2021-05-18 ENCOUNTER — Encounter: Payer: Self-pay | Admitting: Unknown Physician Specialty

## 2021-05-18 LAB — SPECIMEN STATUS REPORT

## 2021-05-18 LAB — NOVEL CORONAVIRUS, NAA: SARS-CoV-2, NAA: NOT DETECTED

## 2021-05-18 LAB — SARS-COV-2, NAA 2 DAY TAT

## 2021-05-19 MED ORDER — AZITHROMYCIN 250 MG PO TABS: ORAL_TABLET | ORAL | 0 refills | Status: AC

## 2021-05-31 ENCOUNTER — Telehealth: Payer: Self-pay

## 2021-05-31 NOTE — Telephone Encounter (Signed)
Copied from [place] [id number]. Topic: General - Other >> May 31, 2021  3:06 PM Pawlus, Brayton Layman A wrote: Reason for CRM: Pt stated she would prefer to get her B12 injection at the pharmacy, pt does not want to come into the office and pay $40, please advise if the B12 injection can be sent to the pharmacy instead.

## 2021-06-01 NOTE — Telephone Encounter (Signed)
Pt notified and gave verbal understanding.

## 2021-06-08 ENCOUNTER — Ambulatory Visit: Payer: 59

## 2021-07-01 NOTE — Progress Notes (Signed)
Name: Jessica Carroll   MRN: [id number]    DOB: October 12, 1971   Date:07/02/2021       Progress Note  Subjective  Chief Complaint  Follow Up  HPI  Depression/anxiety/Insomnia: phq 9 has improved, stress is down since her step son no longer living with them. He was placed in a group home about one month ago. She states mind is always busy at night , wakes up and cannot fall back asleep. Trazodone causes a morning headache. We will try Temazepam, but discussed mindfulness exercises and love and kindness meditations   Menopause: she weaned self off estradiol because she was having daily headache and has resolved since she stopped medication, she is now taking Education officer, community and is helping with night sweats   Chronic constipation and recently diagnosed with IBS by Dr. Vicente Males, she is now on Trulance and it has helped with bloating and pain, symptoms have been controlled    Asthma moderated: she is taking Breo and singulair and seems to be controlling her symptoms, however it has been very costly for her    GERD:  She is now on Protonix BID , dexilant stopped working. She states symptoms are under control    History of iron deficiency and B12 deficiency: Symptoms improved since she got iron infusion. Last ferritin was normal but B12 was low, taking SL B12    Migraine:episodes are worse, she states episodes usually a couple of times a week but this time it has lasted all week, described as pressure on frontal area and behind her eyes, she has associated nausea but no vomiting. She took Maxalt but this time it did not work. She has never taken medication to prevent headache. We will try topamax, discussed possible side effects with patient    Patient Active Problem List   Diagnosis Date Noted   Age-related nuclear cataract of right eye 01/19/2021   Corneal scar, right eye 01/19/2021   Moderate episode of recurrent major depressive disorder ([place]) 12/20/2018   Iron deficiency anemia 12/19/2018   Type A  blood, Rh positive 08/22/2017   S/P vaginal hysterectomy 07/31/2017   Recurrent vaginitis 01/10/2017   Hemorrhoids, external without complications 43/32/9518   Nausea in adult patient 02/24/2016   Endometriosis determined by laparoscopy 01/25/2016   Chronic constipation 08/14/2015   Bilateral ganglion cysts of wrists 08/14/2015   Mild mitral regurgitation 05/19/2015   B12 deficiency 05/18/2015   Asthma, moderate 05/18/2015   Anxiety 05/18/2015   Insomnia, persistent 05/18/2015   Eczema of hand 05/18/2015   Viral keratitis 05/18/2015   Migraine without aura and without status migrainosus, not intractable 05/18/2015   Numerous moles 05/18/2015   Allergic rhinitis 05/18/2015   History of corneal transplant 05/18/2015   Gastroesophageal reflux disease without esophagitis 12/16/2009    Past Surgical History:  Procedure Laterality Date   COLONOSCOPY WITH PROPOFOL N/A 09/29/2017   Procedure: COLONOSCOPY WITH PROPOFOL;  Surgeon: Jonathon Bellows, MD;  Location[facility] END[facility]COPY;  Service: Gastroenterology;  Laterality: N/A;   CORNEAL TRANSPLANT Right 01/07/2013   ESOPHAGOGASTRODUODENOSCOPY (EGD) WITH PROPOFOL N/A 09/29/2017   Procedure: ESOPHAGOGASTRODUODENOSCOPY (EGD) WITH PROPOFOL;  Surgeon: Jonathon Bellows, MD;  Location: [facility] ENDOSCOPY;  Service: Gastroenterology;  Laterality: N/A;   EYE SURGERY     eyelid surgery Right June 19, 2015   [facility] by Dr. Norlene Duel   LAPAROSCOPIC BILATERAL SALPINGECTOMY N/A 01/25/2016   Procedure: LAPAROSCOPIC BILATERAL SALPINGECTOMY, ADHESIOLYSIS, LAPAROSCOPIC EXCISION/FULGERATION ENDOMETRIOSIS ;  Surgeon: [name], MD;  Location: [facility] ORS;  Service: Gynecology;  Laterality: N/A;   Laparoscopic excision and fulguration of endometriosis N/A    bladder flap and uterosacral ligament endometriosis, excised and cauterized   UPPER GI ENDOSCOPY  07/19/2012   VAGINAL HYSTERECTOMY N/A 07/31/2017   Procedure: HYSTERECTOMY VAGINAL;  Surgeon: [name], MD;  Location: [facility] ORS;  Service: Gynecology;  Laterality: N/A;    Family History  Adopted: Yes  Problem Relation Age of Onset   Hyperlipidemia Mother    Hypertension Mother    Diabetes Father    Heart failure Father 21       hear attack   Cancer Neg Hx    Breast cancer Neg Hx     Social History   Tobacco Use   Smoking status: Never   Smokeless tobacco: Never  Substance Use Topics   Alcohol use: Not Currently    Alcohol/week: 0.0 standard drinks    Comment: occ. wine     Current Outpatient Medications:    Albuterol Sulfate, sensor, (PROAIR DIGIHALER) 108 (90 Base) MCG/ACT AEPB, Inhale 2 puffs into the lungs 4 (four) times daily as needed., Disp: 1 each, Rfl: 1   carboxymethylcellul-glycerin (REFRESH OPTIVE) 0.5-0.9 % ophthalmic solution, Frequency:PHARMDIR   Dosage:0.0     Instructions:  Note:6 x OU Dose: 0.5%-0.9%, Disp: , Rfl:    conjugated estrogens (PREMARIN) vaginal cream, Place 1 Applicatorful vaginally daily. For the first 2 weeks after that a few times a week - pea size on urethra and vaginal entry only, Disp: 42.5 g, Rfl: 12   cyclobenzaprine (FLEXERIL) 5 MG tablet, Take 1-2 tablets (5-10 mg total) by mouth 3 (three) times daily as needed for muscle spasms. Take mainly at bedtime as may make drowsy, do not take before driving., Disp: 60 tablet, Rfl: 0   Fluticasone-Salmeterol,sensor, (AIRDUO DIGIHALER) 113-14 MCG/ACT AEPB, Inhale 2 puffs into the lungs 2 (two) times daily. Maintenance, Disp: 1 each, Rfl: 5   ibuprofen (ADVIL) 800 MG tablet, TAKE 1 TABLET BY MOUTH EVERY 8 HOURS AS NEEDED, Disp: 30 tablet, Rfl: 0   linaclotide (LINZESS) 72 MCG capsule, Take 1 capsule (72 mcg total) by mouth daily before breakfast., Disp: 30 capsule, Rfl: 5   MULTIPLE VITAMIN PO, Take 1 tablet by mouth daily., Disp: , Rfl:    pantoprazole (PROTONIX) 40 MG tablet, Take 1 tablet (40 mg total) by mouth 2 (two) times daily before a meal., Disp: 180 tablet, Rfl: 1   prednisoLONE acetate  (PRED FORTE) 1 % ophthalmic suspension, Place 1 drop into the right eye daily., Disp: , Rfl:    promethazine (PHENERGAN) 12.5 MG tablet, Take 1 tablet (12.5 mg total) by mouth every 8 (eight) hours as needed for nausea or vomiting., Disp: 20 tablet, Rfl: 0   sodium chloride (OCEAN) 0.65 % nasal spray, Place into the nose., Disp: , Rfl:    temazepam (RESTORIL) 15 MG capsule, Take 1 capsule (15 mg total) by mouth at bedtime as needed for sleep., Disp: 30 capsule, Rfl: 0   triamcinolone (KENALOG) 0.1 %, Apply 1 application topically 2 (two) times daily as needed., Disp: 80 g, Rfl: 0   valACYclovir (VALTREX) 500 MG tablet, Take 1 tablet by mouth 2 (two) times daily., Disp: , Rfl: 0   cetirizine (ZYRTEC) 5 MG tablet, Take by mouth., Disp: , Rfl:    montelukast (SINGULAIR) 10 MG tablet, Take 1 tablet (10 mg total) by mouth at bedtime., Disp: 30 tablet, Rfl: 5   rizatriptan (MAXALT) 10 MG tablet, Take 1 tablet (10 mg  total) by mouth 2 (two) times daily as needed., Disp: 10 tablet, Rfl: 0  Allergies  Allergen Reactions   Bactrim [Sulfamethoxazole-Trimethoprim] Nausea Only   Penicillins Nausea Only    I personally reviewed active problem list, medication list, allergies, family history, social history, health maintenance with the patient/caregiver today.   ROS  Constitutional: Negative for fever or weight change.  Respiratory: Negative for cough and shortness of breath.   Cardiovascular: Negative for chest pain or palpitations.  Gastrointestinal: Negative for abdominal pain, no bowel changes.  Musculoskeletal: Negative for gait problem or joint swelling.  Skin: Negative for rash.  Neurological: Negative for dizziness , positive for intermittent  headache.  No other specific complaints in a complete review of systems (except as listed in HPI above).   Objective  Vitals:   07/02/21 1419  BP: 114/68  Pulse: 74  Resp: 16  Temp: 97.9 F (36.6 C)  TempSrc: Oral  SpO2: 99%  Weight: 123 lb  (55.8 kg)  Height: 5\' 5"  (1.651 m)    Body mass index is 20.47 kg/m.  Physical Exam  Constitutional: Patient appears well-developed and well-nourished.No distress.  HEENT: head atraumatic, normocephalic, pupils equal and reactive to light, neck supple Cardiovascular: Normal rate, regular rhythm and normal heart sounds.  No murmur heard. No BLE edema. Pulmonary/Chest: Effort normal and breath sounds normal. No respiratory distress. Abdominal: Soft.  There is no tenderness. Psychiatric: Patient has a normal mood and affect. behavior is normal. Judgment and thought content normal.   Recent Results (from the past 2160 hour(s))  POCT urinalysis dipstick     Status: Normal   Collection Time: 04/23/21  8:45 AM  Result Value Ref Range   Color, UA yellow    Clarity, UA clear    Glucose, UA Negative Negative   Bilirubin, UA neg    Ketones, UA neg    Spec Grav, UA 1.010 1.010 - 1.025   Blood, UA neg    pH, UA 5.0 5.0 - 8.0   Protein, UA Negative Negative   Urobilinogen, UA 0.2 0.2 or 1.0 E.U./dL   Nitrite, UA neg    Leukocytes, UA Negative Negative   Appearance yellow    Odor clear   Novel Coronavirus, NAA ([facility])     Status: None   Collection Time: 05/17/21 12:00 AM   Specimen: Nasopharyngeal(NP) swabs in vial transport medium   Nasopharynge  Result Value Ref Range   SARS-CoV-2, NAA Not Detected Not Detected    Comment: This nucleic acid amplification test was developed and its performance characteristics determined by [employer]. Nucleic acid amplification tests include RT-PCR and TMA. This test has not been FDA cleared or approved. This test has been authorized by FDA under an Emergency Use Authorization (EUA). This test is only authorized for the duration of time the declaration that circumstances exist justifying the authorization of the emergency use of in vitro diagnostic tests for detection of SARS-CoV-2 virus and/or diagnosis of COVID-19 infection under  section 564(b)(1) of the Act, 21 U.S.C. 244WNU-2(V) (1), unless the authorization is terminated or revoked sooner. When diagnostic testing is negative, the possibility of a false negative result should be considered in the context of a patient's recent exposures and the presence of clinical signs and symptoms consistent with COVID-19. An individual without symptoms of COVID-19 and who is not shedding SARS-CoV-2 virus wo uld expect to have a negative (not detected) result in this assay.   SARS-COV-2, NAA 2 DAY TAT     Status: None  Collection Time: 05/17/21 12:00 AM   Nasopharynge  Result Value Ref Range   SARS-CoV-2, NAA 2 DAY TAT Performed   Specimen status report     Status: None   Collection Time: 05/17/21 12:00 AM  Result Value Ref Range   specimen status report Comment     Comment: Please note Please note The date and/or time of collection was not indicated on the requisition as required by state and federal law.  The date of receipt of the specimen was used as the collection date if not supplied.      PHQ2/9: Depression screen [facility] 2/9 07/02/2021 05/17/2021 04/23/2021 03/26/2021 12/25/2020  Decreased Interest 0 0 0 2 1  Down, Depressed, Hopeless 0 0 0 2 1  PHQ - 2 Score 0 0 0 4 2  Altered sleeping 3 0 0 2 2  Tired, decreased energy 1 0 0 2 2  Change in appetite 0 0 0 2 0  Feeling bad or failure about yourself  0 0 0 0 0  Trouble concentrating 0 0 0 0 0  Moving slowly or fidgety/restless 0 0 0 0 0  Suicidal thoughts 0 0 0 0 0  PHQ-9 Score 4 0 0 10 6  Difficult doing work/chores - Not difficult at all Not difficult at all Somewhat difficult Not difficult at all  Some recent data might be hidden    phq 9 is negative   Fall Risk: Fall Risk  07/02/2021 05/17/2021 04/23/2021 03/26/2021 12/25/2020  Falls in the past year? 0 0 0 0 0  Number falls in past yr: 0 0 0 0 0  Injury with Fall? 0 0 0 - 0  Risk for fall due to : - - - - -  Follow up - Falls evaluation completed - Falls  prevention discussed -      Functional Status Survey: Is the patient deaf or have difficulty hearing?: No Does the patient have difficulty seeing, even when wearing glasses/contacts?: No Does the patient have difficulty concentrating, remembering, or making decisions?: No Does the patient have difficulty walking or climbing stairs?: No Does the patient have difficulty dressing or bathing?: No Does the patient have difficulty doing errands alone such as visiting a doctor's office or shopping?: No    Assessment & Plan  1. Mild episode of recurrent major depressive disorder ([place])   2. Moderate persistent asthma without complication  We will change from Breo to DigDuo due to cost and gave her coupon   - Fluticasone-Salmeterol,sensor, (AIRDUO DIGIHALER) 113-14 MCG/ACT AEPB; Inhale 2 puffs into the lungs 2 (two) times daily. Maintenance  Dispense: 1 each; Refill: 5 - Albuterol Sulfate, sensor, (PROAIR DIGIHALER) 108 (90 Base) MCG/ACT AEPB; Inhale 2 puffs into the lungs 4 (four) times daily as needed.  Dispense: 1 each; Refill: 1 - montelukast (SINGULAIR) 10 MG tablet; Take 1 tablet (10 mg total) by mouth at bedtime.  Dispense: 30 tablet; Refill: 5  3. Perennial allergic rhinitis with seasonal variation  - montelukast (SINGULAIR) 10 MG tablet; Take 1 tablet (10 mg total) by mouth at bedtime.  Dispense: 30 tablet; Refill: 5  4. Gastroesophageal reflux disease without esophagitis   5. B12 deficiency  Recheck level, taking SL B12 due to cost of injectable form   6. Migraine without aura and without status migrainosus, intractable   - rizatriptan (MAXALT) 10 MG tablet; Take 1 tablet (10 mg total) by mouth 2 (two) times daily as needed.  Dispense: 10 tablet; Refill: 0  We gave her  an injection of Aimovig before she left the office today  7. Chronic constipation   8. History of iron deficiency anemia   9. Psychophysiological insomnia  - temazepam (RESTORIL) 15 MG capsule; Take  1 capsule (15 mg total) by mouth at bedtime as needed for sleep.  Dispense: 30 capsule; Refill: 0

## 2021-07-02 ENCOUNTER — Other Ambulatory Visit: Payer: Self-pay | Admitting: Family Medicine

## 2021-07-02 ENCOUNTER — Ambulatory Visit: Payer: 59 | Admitting: Family Medicine

## 2021-07-02 ENCOUNTER — Other Ambulatory Visit: Payer: Self-pay

## 2021-07-02 ENCOUNTER — Encounter: Payer: Self-pay | Admitting: Family Medicine

## 2021-07-02 VITALS — BP 114/68 | HR 74 | Temp 97.9°F | Resp 16 | Ht 65.0 in | Wt 123.0 lb

## 2021-07-02 DIAGNOSIS — Z862 Personal history of diseases of the blood and blood-forming organs and certain disorders involving the immune mechanism: Secondary | ICD-10-CM

## 2021-07-02 DIAGNOSIS — G43011 Migraine without aura, intractable, with status migrainosus: Secondary | ICD-10-CM

## 2021-07-02 DIAGNOSIS — J302 Other seasonal allergic rhinitis: Secondary | ICD-10-CM

## 2021-07-02 DIAGNOSIS — F330 Major depressive disorder, recurrent, mild: Secondary | ICD-10-CM

## 2021-07-02 DIAGNOSIS — J3089 Other allergic rhinitis: Secondary | ICD-10-CM

## 2021-07-02 DIAGNOSIS — K219 Gastro-esophageal reflux disease without esophagitis: Secondary | ICD-10-CM | POA: Diagnosis not present

## 2021-07-02 DIAGNOSIS — F5104 Psychophysiologic insomnia: Secondary | ICD-10-CM

## 2021-07-02 DIAGNOSIS — K5909 Other constipation: Secondary | ICD-10-CM

## 2021-07-02 DIAGNOSIS — J4540 Moderate persistent asthma, uncomplicated: Secondary | ICD-10-CM

## 2021-07-02 DIAGNOSIS — E538 Deficiency of other specified B group vitamins: Secondary | ICD-10-CM

## 2021-07-02 LAB — VITAMIN B12: Vitamin B-12: 415 pg/mL (ref 200–1100)

## 2021-07-02 MED ORDER — RIZATRIPTAN BENZOATE 10 MG PO TABS: 10.0000 mg | ORAL_TABLET | Freq: Two times a day (BID) | ORAL | 0 refills | Status: DC | PRN

## 2021-07-02 MED ORDER — MONTELUKAST SODIUM 10 MG PO TABS: 10.0000 mg | ORAL_TABLET | Freq: Every day | ORAL | 5 refills | Status: DC

## 2021-07-02 MED ORDER — AIRDUO DIGIHALER 113-14 MCG/ACT IN AEPB
2.0000 | INHALATION_SPRAY | Freq: Two times a day (BID) | RESPIRATORY_TRACT | 5 refills | Status: DC
Start: 2021-07-02 — End: 2021-07-05

## 2021-07-02 MED ORDER — PROAIR DIGIHALER 108 (90 BASE) MCG/ACT IN AEPB
2.0000 | INHALATION_SPRAY | Freq: Four times a day (QID) | RESPIRATORY_TRACT | 1 refills | Status: DC | PRN
Start: 2021-07-02 — End: 2021-11-08

## 2021-07-02 MED ORDER — TOPIRAMATE 50 MG PO TABS: 50.0000 mg | ORAL_TABLET | Freq: Two times a day (BID) | ORAL | 0 refills | Status: DC

## 2021-07-02 MED ORDER — ERENUMAB-AOOE 70 MG/ML ~~LOC~~ SOAJ
140.0000 mg | Freq: Once | SUBCUTANEOUS | Status: AC
  Administered 2021-07-02: 140 mg via SUBCUTANEOUS

## 2021-07-02 MED ORDER — TEMAZEPAM 15 MG PO CAPS: 15.0000 mg | ORAL_CAPSULE | Freq: Every evening | ORAL | 0 refills | Status: DC | PRN

## 2021-07-02 NOTE — Telephone Encounter (Signed)
   Notes to clinic:  PT'S INSURANCE PREFERS LEVALBUTEROL & IS REQUIRING A PA ON PROAIR DIGIHALER. WOULD YOU LIKE TO CHANGE TO LEVALBUTEROL? THANKS   Requested Prescriptions  Pending Prescriptions Disp Refills   levalbuterol (XOPENEX HFA) 45 MCG/ACT inhaler [Pharmacy Med Name: LEVALBUTEROL TARTRATE 45 MCG/ACT INH AERO GM] 0 g 0      Pulmonology:  Beta Agonists Failed - 07/02/2021  3:01 PM      Failed - One inhaler should last at least one month. If the patient is requesting refills earlier, contact the patient to check for uncontrolled symptoms.      Passed - Valid encounter within last 12 months    Recent Outpatient Visits           Today Mild episode of recurrent major depressive disorder [facility])   [facility] Steele Sizer, MD   1 month ago Viral upper respiratory tract infection   [facility] [place], Malachy Mood, NP   2 months ago Back pain, unspecified back location, unspecified back pain laterality, unspecified chronicity   [facility] Delsa Grana, PA-C   3 months ago Moderate persistent asthma without complication   [facility] [place], Drue Stager, MD   6 months ago Migraine without aura and without status migrainosus, not intractable   [facility] Steele Sizer, MD       Future Appointments             In 6 months Ancil Boozer, Drue Stager, MD [facility], [facility]

## 2021-07-02 NOTE — Telephone Encounter (Signed)
  Notes to clinic:  PATIENT'S INSURANCE PREFERS [place] REQUIRING PA FOR AIRDUO. WOULD YOU LIKE TO CHANGE TO SYMBICORT? THANKS!   Requested Prescriptions  Pending Prescriptions Disp Refills   SYMBICORT 80-4.5 MCG/ACT inhaler [Pharmacy Med Name: [place] 80-4.5 MCG/ACT INH AERO GM] 0 g 0      Pulmonology:  Combination Products Passed - 07/02/2021  3:00 PM      Passed - Valid encounter within last 12 months    Recent Outpatient Visits           Today Mild episode of recurrent major depressive disorder [facility])   [facility] Steele Sizer, MD   1 month ago Viral upper respiratory tract infection   [facility] [place], Malachy Mood, NP   2 months ago Back pain, unspecified back location, unspecified back pain laterality, unspecified chronicity   [facility] Delsa Grana, PA-C   3 months ago Moderate persistent asthma without complication   [facility], Drue Stager, MD   6 months ago Migraine without aura and without status migrainosus, not intractable   [facility] Steele Sizer, MD       Future Appointments             In 6 months Ancil Boozer, Drue Stager, MD [facility], [facility]

## 2021-07-02 NOTE — Telephone Encounter (Signed)
Pt called and stated that insurance will not cover Albuterol Sulfate, sensor, (PROAIR DIGIHALER) 108 (90 Base) MCG/ACT AEPB   She stated that they advised her to get Advair @ 150 to take 2x daily / please advise  and send replacement to [facility] [place] - [address]  [place], [place] [address]  Phone:  [phone]  Fax:  [phone]

## 2021-07-05 ENCOUNTER — Encounter: Payer: Self-pay | Admitting: Family Medicine

## 2021-07-05 ENCOUNTER — Other Ambulatory Visit: Payer: Self-pay | Admitting: Family Medicine

## 2021-07-05 MED ORDER — FLUTICASONE-SALMETEROL 100-50 MCG/ACT IN AEPB: 1.0000 | INHALATION_SPRAY | Freq: Two times a day (BID) | RESPIRATORY_TRACT | 5 refills | Status: DC

## 2021-07-05 NOTE — Telephone Encounter (Signed)
PA done waiting on response

## 2021-08-03 ENCOUNTER — Other Ambulatory Visit: Payer: Self-pay | Admitting: Family Medicine

## 2021-08-03 DIAGNOSIS — G43011 Migraine without aura, intractable, with status migrainosus: Secondary | ICD-10-CM

## 2021-08-03 NOTE — Telephone Encounter (Signed)
Last seen 7.22.2022 upcoming sch'd 10.24.2022

## 2021-08-03 NOTE — Telephone Encounter (Signed)
Requested medication (s) are due for refill today:  yes   Requested medication (s) are on the active medication list: yes   Last refill:  07/02/2021  Future visit scheduled: yes  Notes to clinic:  this refill cannot be delegated    Requested Prescriptions  Pending Prescriptions Disp Refills   topiramate (TOPAMAX) 50 MG tablet [Pharmacy Med Name: TOPIRAMATE 50 MG TAB] 120 tablet 0    Sig: TAKE 1 OR 2 TABLETS BY MOUTH TWICE DAILY(START IN THE EVENING AND GO UP BY ONE PILL EVERY THIRD DAY UNTIL YOU REACH 2 TABS TWICE DAILY)     Not Delegated - Neurology: Anticonvulsants - topiramate & zonisamide Failed - 08/03/2021  7:45 AM      Failed - This refill cannot be delegated      Passed - Cr in normal range and within 360 days    Creat  Date Value Ref Range Status  12/25/2020 0.68 0.50 - 1.05 mg/dL Final    Comment:    For patients >58 years of age, the reference limit for Creatinine is approximately 13% higher for people identified as African-American. .           Passed - CO2 in normal range and within 360 days    CO2  Date Value Ref Range Status  12/25/2020 28 20 - 32 mmol/L Final          Passed - Valid encounter within last 12 months    Recent Outpatient Visits           1 month ago Mild episode of recurrent major depressive disorder [facility])   [facility] Steele Sizer, MD   2 months ago Viral upper respiratory tract infection   [facility], Malachy Mood, NP   3 months ago Back pain, unspecified back location, unspecified back pain laterality, unspecified chronicity   [facility] Delsa Grana, PA-C   4 months ago Moderate persistent asthma without complication   [facility] [place], Drue Stager, MD   7 months ago Migraine without aura and without status migrainosus, not intractable   [facility] Steele Sizer, MD       Future Appointments             In 2 months  Ancil Boozer, Drue Stager, MD [facility], [place]   In 5 months Steele Sizer, MD [facility], [facility]

## 2021-08-05 ENCOUNTER — Other Ambulatory Visit: Payer: Self-pay

## 2021-08-05 MED ORDER — PLECANATIDE 3 MG PO TABS: 1.0000 | ORAL_TABLET | Freq: Every day | ORAL | 3 refills | Status: DC

## 2021-08-09 ENCOUNTER — Telehealth: Payer: Self-pay | Admitting: Gastroenterology

## 2021-08-09 ENCOUNTER — Telehealth: Payer: 59 | Admitting: Gastroenterology

## 2021-08-09 NOTE — Telephone Encounter (Signed)
LVM for patient to call our office. Had to change office visit to virtual visit. Dr.Anna will not be in the office today.

## 2021-08-18 ENCOUNTER — Other Ambulatory Visit: Payer: Self-pay

## 2021-08-18 DIAGNOSIS — R1013 Epigastric pain: Secondary | ICD-10-CM

## 2021-08-18 DIAGNOSIS — K219 Gastro-esophageal reflux disease without esophagitis: Secondary | ICD-10-CM

## 2021-08-19 MED ORDER — PANTOPRAZOLE SODIUM 40 MG PO TBEC: 40.0000 mg | DELAYED_RELEASE_TABLET | Freq: Two times a day (BID) | ORAL | 1 refills | Status: DC

## 2021-08-24 ENCOUNTER — Other Ambulatory Visit: Payer: Self-pay | Admitting: Family Medicine

## 2021-08-24 DIAGNOSIS — G43011 Migraine without aura, intractable, with status migrainosus: Secondary | ICD-10-CM

## 2021-08-25 MED ORDER — RIZATRIPTAN BENZOATE 10 MG PO TABS: 10.0000 mg | ORAL_TABLET | Freq: Two times a day (BID) | ORAL | 0 refills | Status: DC | PRN

## 2021-10-04 ENCOUNTER — Ambulatory Visit: Payer: 59 | Admitting: Family Medicine

## 2021-10-04 ENCOUNTER — Other Ambulatory Visit: Payer: Self-pay

## 2021-10-04 ENCOUNTER — Encounter: Payer: Self-pay | Admitting: Family Medicine

## 2021-10-04 VITALS — BP 108/62 | HR 78 | Temp 97.5°F | Resp 16 | Ht 65.0 in | Wt 124.8 lb

## 2021-10-04 DIAGNOSIS — J4540 Moderate persistent asthma, uncomplicated: Secondary | ICD-10-CM

## 2021-10-04 DIAGNOSIS — G43109 Migraine with aura, not intractable, without status migrainosus: Secondary | ICD-10-CM | POA: Diagnosis not present

## 2021-10-04 DIAGNOSIS — F5104 Psychophysiologic insomnia: Secondary | ICD-10-CM

## 2021-10-04 DIAGNOSIS — G43011 Migraine without aura, intractable, with status migrainosus: Secondary | ICD-10-CM

## 2021-10-04 DIAGNOSIS — J302 Other seasonal allergic rhinitis: Secondary | ICD-10-CM

## 2021-10-04 DIAGNOSIS — J3089 Other allergic rhinitis: Secondary | ICD-10-CM

## 2021-10-04 MED ORDER — UBRELVY 100 MG PO TABS: 1.0000 | ORAL_TABLET | ORAL | 5 refills | Status: DC

## 2021-10-04 MED ORDER — RIZATRIPTAN BENZOATE 10 MG PO TABS: 10.0000 mg | ORAL_TABLET | Freq: Two times a day (BID) | ORAL | 2 refills | Status: DC | PRN

## 2021-10-04 MED ORDER — MONTELUKAST SODIUM 10 MG PO TABS: 10.0000 mg | ORAL_TABLET | Freq: Every day | ORAL | 1 refills | Status: DC

## 2021-10-04 MED ORDER — TEMAZEPAM 15 MG PO CAPS: 15.0000 mg | ORAL_CAPSULE | Freq: Every evening | ORAL | 0 refills | Status: DC | PRN

## 2021-10-04 NOTE — Progress Notes (Signed)
Name: Jessica Carroll   MRN: [id number]    DOB: [birth year]   Date:10/04/2021       Progress Note  Subjective  Chief Complaint  Follow Up  HPI  Depression/anxiety/Insomnia: phq 9 has improved, stress is down since her step son no longer living with them. He was placed in a group home since Summer of 2022  She states mind is always busy at night , wakes up and cannot fall back asleep. Trazodone causes a morning headache. We gave her Temazepam and seems to work, she takes it prn   Menopause: she weaned self off estradiol because she was having daily headache and has resolved since she stopped medication, she is now taking Education officer, community and is helping with night sweats   Chronic constipation and recently diagnosed with IBS by Dr. Vicente Males, she is now on Trulance and it has helped with bloating and pain, symptoms have been controlled Unchanged    Asthma moderated: she was on Breo but too costly and is back on Advair and singulair and seems to be controlling her symptoms. She denies cough, wheezing or SOB    GERD:  She is now on Protonix BID , dexilant stopped working. She states symptoms are under control , getting through mail order pharmacy    History of iron deficiency and B12 deficiency: Symptoms improved since she got iron infusion. Last ferritin, also B12 improved with supplementation    Migraine: she was seen this Summer and episodes were getting more frequent and the week she was seen had a headache all week. We gave her Topamax but could not tolerate side effects, still takes Maxalt and has episodes a couple times a week and can last up to few days if unable to take prn medication. She has associated phonophobia, photophobia and nausea, no vomiting. She states sometimes has aura, she noticed sensitivity to light and also nausea prior to some of the migraine episodes. Discussed other options and we will give her Roselyn Meier to take every other day fore prevention and maxalt prn   Dizziness: she  went grocery shopping yesterday morning and left the store because she was feeling lightheaded. She rested the rest of the day . No spinning sensation or heaving loss. Worse symptoms when going from laying to standing. BP today is towards low end of normal, this am she had mild symptoms but feeling back to normal now. Discussed monitoring bp at home, staying hydrated, may try broth or caffeine if happens again. Get up slowly   Patient Active Problem List   Diagnosis Date Noted   Age-related nuclear cataract of right eye 01/19/2021   Corneal scar, right eye 01/19/2021   Moderate episode of recurrent major depressive disorder ([place]) 12/20/2018   Iron deficiency anemia 12/19/2018   Type A blood, Rh positive 08/22/2017   S/P vaginal hysterectomy 07/31/2017   Recurrent vaginitis 01/10/2017   Hemorrhoids, external without complications 55/73/2202   Nausea in adult patient 02/24/2016   Endometriosis determined by laparoscopy 01/25/2016   Chronic constipation 08/14/2015   Bilateral ganglion cysts of wrists 08/14/2015   Mild mitral regurgitation 05/19/2015   B12 deficiency 05/18/2015   Asthma, moderate 05/18/2015   Anxiety 05/18/2015   Insomnia, persistent 05/18/2015   Eczema of hand 05/18/2015   Viral keratitis 05/18/2015   Migraine without aura and without status migrainosus, not intractable 05/18/2015   Numerous moles 05/18/2015   Allergic rhinitis 05/18/2015   History of corneal transplant 05/18/2015   Gastroesophageal reflux disease without esophagitis 12/16/2009  Past Surgical History:  Procedure Laterality Date   COLONOSCOPY WITH PROPOFOL N/A 09/29/2017   Procedure: COLONOSCOPY WITH PROPOFOL;  Surgeon: Jonathon Bellows, MD;  Location: [facility] ENDOSCOPY;  Service: Gastroenterology;  Laterality: N/A;   CORNEAL TRANSPLANT Right 01/07/2013   ESOPHAGOGASTRODUODENOSCOPY (EGD) WITH PROPOFOL N/A 09/29/2017   Procedure: ESOPHAGOGASTRODUODENOSCOPY (EGD) WITH PROPOFOL;  Surgeon: Jonathon Bellows, MD;   Location: [facility] ENDOSCOPY;  Service: Gastroenterology;  Laterality: N/A;   EYE SURGERY     eyelid surgery Right June 19, 2015   [facility] by Dr. Norlene Duel   LAPAROSCOPIC BILATERAL SALPINGECTOMY N/A 01/25/2016   Procedure: LAPAROSCOPIC BILATERAL SALPINGECTOMY, ADHESIOLYSIS, LAPAROSCOPIC EXCISION/FULGERATION ENDOMETRIOSIS ;  Surgeon: [name], MD;  Location: [facility] ORS;  Service: Gynecology;  Laterality: N/A;   Laparoscopic excision and fulguration of endometriosis N/A    bladder flap and uterosacral ligament endometriosis, excised and cauterized   UPPER GI ENDOSCOPY  07/19/2012   VAGINAL HYSTERECTOMY N/A 07/31/2017   Procedure: HYSTERECTOMY VAGINAL;  Surgeon: [name], MD;  Location: [facility] ORS;  Service: Gynecology;  Laterality: N/A;    Family History  Adopted: Yes  Problem Relation Age of Onset   Hyperlipidemia Mother    Hypertension Mother    Diabetes Father    Heart failure Father 73       hear attack   Cancer Neg Hx    Breast cancer Neg Hx     Social History   Tobacco Use   Smoking status: Never   Smokeless tobacco: Never  Substance Use Topics   Alcohol use: Not Currently    Alcohol/week: 0.0 standard drinks    Comment: occ. wine     Current Outpatient Medications:    Albuterol Sulfate, sensor, (PROAIR DIGIHALER) 108 (90 Base) MCG/ACT AEPB, Inhale 2 puffs into the lungs 4 (four) times daily as needed., Disp: 1 each, Rfl: 1   carboxymethylcellul-glycerin (REFRESH OPTIVE) 0.5-0.9 % ophthalmic solution, Frequency:PHARMDIR   Dosage:0.0     Instructions:  Note:6 x OU Dose: 0.5%-0.9%, Disp: , Rfl:    conjugated estrogens (PREMARIN) vaginal cream, Place 1 Applicatorful vaginally daily. For the first 2 weeks after that a few times a week - pea size on urethra and vaginal entry only, Disp: 42.5 g, Rfl: 12   cyclobenzaprine (FLEXERIL) 5 MG tablet, Take 1-2 tablets (5-10 mg total) by mouth 3 (three) times daily as needed for muscle spasms. Take mainly at bedtime  as may make drowsy, do not take before driving., Disp: 60 tablet, Rfl: 0   fluticasone-salmeterol (ADVAIR) 100-50 MCG/ACT AEPB, Inhale 1 puff into the lungs 2 (two) times daily., Disp: 1 each, Rfl: 5   ibuprofen (ADVIL) 800 MG tablet, TAKE 1 TABLET BY MOUTH EVERY 8 HOURS AS NEEDED, Disp: 30 tablet, Rfl: 0   MULTIPLE VITAMIN PO, Take 1 tablet by mouth daily., Disp: , Rfl:    pantoprazole (PROTONIX) 40 MG tablet, Take 1 tablet (40 mg total) by mouth 2 (two) times daily before a meal., Disp: 180 tablet, Rfl: 1   Plecanatide 3 MG TABS, Take 1 tablet by mouth daily., Disp: 90 tablet, Rfl: 3   prednisoLONE acetate (PRED FORTE) 1 % ophthalmic suspension, Place 1 drop into the right eye daily., Disp: , Rfl:    promethazine (PHENERGAN) 12.5 MG tablet, Take 1 tablet (12.5 mg total) by mouth every 8 (eight) hours as needed for nausea or vomiting., Disp: 20 tablet, Rfl: 0   sodium chloride (OCEAN) 0.65 % nasal spray, Place into the nose., Disp: , Rfl:  triamcinolone (KENALOG) 0.1 %, Apply 1 application topically 2 (two) times daily as needed., Disp: 80 g, Rfl: 0   Ubrogepant (UBRELVY) 100 MG TABS, Take 1 tablet by mouth every other day., Disp: 16 tablet, Rfl: 5   valACYclovir (VALTREX) 500 MG tablet, Take 1 tablet by mouth 2 (two) times daily., Disp: , Rfl: 0   cetirizine (ZYRTEC) 5 MG tablet, Take by mouth., Disp: , Rfl:    montelukast (SINGULAIR) 10 MG tablet, Take 1 tablet (10 mg total) by mouth at bedtime., Disp: 90 tablet, Rfl: 1   rizatriptan (MAXALT) 10 MG tablet, Take 1 tablet (10 mg total) by mouth 2 (two) times daily as needed., Disp: 10 tablet, Rfl: 2   temazepam (RESTORIL) 15 MG capsule, Take 1 capsule (15 mg total) by mouth at bedtime as needed for sleep., Disp: 30 capsule, Rfl: 0  Allergies  Allergen Reactions   Bactrim [Sulfamethoxazole-Trimethoprim] Nausea Only   Penicillins Nausea Only   Topamax [Topiramate] Other (See Comments)    Fatigue     I personally reviewed active problem  list, medication list, allergies, family history, social history, health maintenance with the patient/caregiver today.   ROS  Constitutional: Negative for fever or weight change.  Respiratory: Negative for cough and shortness of breath.   Cardiovascular: Negative for chest pain or palpitations.  Gastrointestinal: Negative for abdominal pain, no bowel changes.  Musculoskeletal: Negative for gait problem or joint swelling.  Skin: Negative for rash.  Neurological:positive  for dizziness and intermittent  headache.  No other specific complaints in a complete review of systems (except as listed in HPI above).   Objective  Vitals:   10/04/21 1409  BP: 108/62  Pulse: 78  Resp: 16  Temp: (!) 97.5 F (36.4 C)  TempSrc: Oral  SpO2: 99%  Weight: 124 lb 12.8 oz (56.6 kg)  Height: 5\' 5"  (1.651 m)    Body mass index is 20.77 kg/m.  Physical Exam  Constitutional: Patient appears well-developed and well-nourished. Obese  No distress.  HEENT: head atraumatic, normocephalic, pupils equal and reactive to light, ears normal TM bilaterally, neck supple, throat within normal limits Cardiovascular: Normal rate, regular rhythm and normal heart sounds.  No murmur heard. No BLE edema. Pulmonary/Chest: Effort normal and breath sounds normal. No respiratory distress. Abdominal: Soft.  There is no tenderness. Psychiatric: Patient has a normal mood and affect. behavior is normal. Judgment and thought content normal.    PHQ2/9: Depression screen [facility] 2/9 10/04/2021 07/02/2021 05/17/2021 04/23/2021 03/26/2021  Decreased Interest 0 0 0 0 2  Down, Depressed, Hopeless 0 0 0 0 2  PHQ - 2 Score 0 0 0 0 4  Altered sleeping 3 3 0 0 2  Tired, decreased energy 0 1 0 0 2  Change in appetite 0 0 0 0 2  Feeling bad or failure about yourself  0 0 0 0 0  Trouble concentrating 0 0 0 0 0  Moving slowly or fidgety/restless 0 0 0 0 0  Suicidal thoughts 0 0 0 0 0  PHQ-9 Score 3 4 0 0 10  Difficult doing work/chores  Not difficult at all - Not difficult at all Not difficult at all Somewhat difficult  Some recent data might be hidden    phq 9 is negative   Fall Risk: Fall Risk  10/04/2021 07/02/2021 05/17/2021 04/23/2021 03/26/2021  Falls in the past year? 0 0 0 0 0  Number falls in past yr: 0 0 0 0 0  Injury with Fall? 0  0 0 0 -  Risk for fall due to : No Fall Risks - - - -  Follow up Falls prevention discussed - Falls evaluation completed - Falls prevention discussed    Assessment and Plan:   1. Migraine with aura and without status migrainosus, not intractable  - Ubrogepant (UBRELVY) 100 MG TABS; Take 1 tablet by mouth every other day.  Dispense: 16 tablet; Refill: 5  2. Intractable migraine without aura and with status migrainosus  - rizatriptan (MAXALT) 10 MG tablet; Take 1 tablet (10 mg total) by mouth 2 (two) times daily as needed.  Dispense: 10 tablet; Refill: 2  3. Psychophysiological insomnia  - temazepam (RESTORIL) 15 MG capsule; Take 1 capsule (15 mg total) by mouth at bedtime as needed for sleep.  Dispense: 30 capsule; Refill: 0  4. Moderate persistent asthma without complication  - montelukast (SINGULAIR) 10 MG tablet; Take 1 tablet (10 mg total) by mouth at bedtime.  Dispense: 90 tablet; Refill: 1  5. Perennial allergic rhinitis with seasonal variation  - montelukast (SINGULAIR) 10 MG tablet; Take 1 tablet (10 mg total) by mouth at bedtime.  Dispense: 90 tablet; Refill: 1

## 2021-10-08 ENCOUNTER — Encounter: Payer: Self-pay | Admitting: Family Medicine

## 2021-10-08 ENCOUNTER — Telehealth (INDEPENDENT_AMBULATORY_CARE_PROVIDER_SITE_OTHER): Payer: 59 | Admitting: Family Medicine

## 2021-10-08 VITALS — Temp 98.0°F | Ht 65.0 in | Wt 124.0 lb

## 2021-10-08 DIAGNOSIS — J4540 Moderate persistent asthma, uncomplicated: Secondary | ICD-10-CM

## 2021-10-08 DIAGNOSIS — J029 Acute pharyngitis, unspecified: Secondary | ICD-10-CM | POA: Diagnosis not present

## 2021-10-08 MED ORDER — PREDNISONE 20 MG PO TABS: ORAL_TABLET | ORAL | 0 refills | Status: DC

## 2021-10-08 NOTE — Progress Notes (Signed)
Name: Jessica Carroll   MRN: [id number]    DOB: [birth year]   Date:10/08/2021       Progress Note  Subjective:   Chief Complaint  Chief Complaint  Patient presents with  . Sore Throat    SX started 10/27 morning been Red, slight itch, hurt to swallow, feel it swollen. Denies fever, not been around anyone sick. 2 negative COVID test done one at work and at home.    I connected with  [name]  on 10/08/21 at 11:20 AM EDT by a video enabled telemedicine application and verified that I am speaking with the correct person using two identifiers.  I discussed the limitations of evaluation and management by telemedicine and the availability of in person appointments. The patient expressed understanding and agreed to proceed. Staff also discussed with the patient that there may be a patient responsible charge related to this service. Patient Location: work Provider Location: [facility] Additional Individuals present: none  Sore Throat  This is a new problem. The current episode started yesterday. The problem has been waxing and waning. Neither side of throat is experiencing more pain than the other. There has been no fever. The pain is at a severity of 4/10. Associated symptoms include headaches (slight, history of migraines). Pertinent negatives include no abdominal pain, congestion, coughing, diarrhea, drooling, ear discharge, ear pain, hoarse voice, plugged ear sensation, neck pain, shortness of breath, stridor, swollen glands, trouble swallowing or vomiting. She has had no exposure to strep or mono. She has tried gargles and acetaminophen (nyquil, salt water gargle, ibuprofen) for the symptoms. The treatment provided moderate relief.  Able to drink and eat but it is sore when she does so, no choking, she states throat is red  A little headache   Pertinent neg/positives Constitutional:  Negative for activity change, appetite change, chills, diaphoresis, fatigue, fever and unexpected  weight change.  HENT:  Positive for rhinorrhea (clear nasal discharge w/o sneezing or congestion) and sore throat. Negative for congestion, drooling, ear discharge, ear pain, hoarse voice, sinus pressure, sinus pain, sneezing, trouble swallowing and voice change.   Eyes: Negative.  Negative for pain, discharge, redness and itching.  Respiratory: Negative.  Negative for cough, shortness of breath and stridor.   Cardiovascular: Negative.  Negative for chest pain, palpitations and leg swelling.  Gastrointestinal:  Negative for abdominal pain, diarrhea and vomiting.  Endocrine: Negative.   Musculoskeletal:  Negative for neck pain. No myalgias Skin: Negative.  Rash Allergic/Immunologic: Positive for environmental allergies. Negative for food allergies and immunocompromised state.  Neurological:  Positive for headaches (slight, history of migraines).  Hematological:  Negative for adenopathy.      Patient Active Problem List   Diagnosis Date Noted  . Age-related nuclear cataract of right eye 01/19/2021  . Corneal scar, right eye 01/19/2021  . Moderate episode of recurrent major depressive disorder ([place]) 12/20/2018  . Iron deficiency anemia 12/19/2018  . Type A blood, Rh positive 08/22/2017  . S/P vaginal hysterectomy 07/31/2017  . Recurrent vaginitis 01/10/2017  . Hemorrhoids, external without complications 30/06/6225  . Nausea in adult patient 02/24/2016  . Endometriosis determined by laparoscopy 01/25/2016  . Chronic constipation 08/14/2015  . Bilateral ganglion cysts of wrists 08/14/2015  . Mild mitral regurgitation 05/19/2015  . B12 deficiency 05/18/2015  . Asthma, moderate 05/18/2015  . Anxiety 05/18/2015  . Insomnia, persistent 05/18/2015  . Eczema of hand 05/18/2015  . Viral keratitis 05/18/2015  . Migraine without aura and without status migrainosus, not  intractable 05/18/2015  . Numerous moles 05/18/2015  . Allergic rhinitis 05/18/2015  . History of corneal transplant  05/18/2015  . Gastroesophageal reflux disease without esophagitis 12/16/2009    Social History   Tobacco Use  . Smoking status: Never  . Smokeless tobacco: Never  Substance Use Topics  . Alcohol use: Not Currently    Alcohol/week: 0.0 standard drinks    Comment: occ. wine     Current Outpatient Medications:  .  Albuterol Sulfate, sensor, (PROAIR DIGIHALER) 108 (90 Base) MCG/ACT AEPB, Inhale 2 puffs into the lungs 4 (four) times daily as needed., Disp: 1 each, Rfl: 1 .  carboxymethylcellul-glycerin (REFRESH OPTIVE) 0.5-0.9 % ophthalmic solution, Frequency:PHARMDIR   Dosage:0.0     Instructions:  Note:6 x OU Dose: 0.5%-0.9%, Disp: , Rfl:  .  conjugated estrogens (PREMARIN) vaginal cream, Place 1 Applicatorful vaginally daily. For the first 2 weeks after that a few times a week - pea size on urethra and vaginal entry only, Disp: 42.5 g, Rfl: 12 .  cyclobenzaprine (FLEXERIL) 5 MG tablet, Take 1-2 tablets (5-10 mg total) by mouth 3 (three) times daily as needed for muscle spasms. Take mainly at bedtime as may make drowsy, do not take before driving., Disp: 60 tablet, Rfl: 0 .  fluticasone-salmeterol (ADVAIR) 100-50 MCG/ACT AEPB, Inhale 1 puff into the lungs 2 (two) times daily., Disp: 1 each, Rfl: 5 .  ibuprofen (ADVIL) 800 MG tablet, TAKE 1 TABLET BY MOUTH EVERY 8 HOURS AS NEEDED, Disp: 30 tablet, Rfl: 0 .  montelukast (SINGULAIR) 10 MG tablet, Take 1 tablet (10 mg total) by mouth at bedtime., Disp: 90 tablet, Rfl: 1 .  MULTIPLE VITAMIN PO, Take 1 tablet by mouth daily., Disp: , Rfl:  .  pantoprazole (PROTONIX) 40 MG tablet, Take 1 tablet (40 mg total) by mouth 2 (two) times daily before a meal., Disp: 180 tablet, Rfl: 1 .  Plecanatide 3 MG TABS, Take 1 tablet by mouth daily., Disp: 90 tablet, Rfl: 3 .  prednisoLONE acetate (PRED FORTE) 1 % ophthalmic suspension, Place 1 drop into the right eye daily., Disp: , Rfl:  .  promethazine (PHENERGAN) 12.5 MG tablet, Take 1 tablet (12.5 mg total) by  mouth every 8 (eight) hours as needed for nausea or vomiting., Disp: 20 tablet, Rfl: 0 .  rizatriptan (MAXALT) 10 MG tablet, Take 1 tablet (10 mg total) by mouth 2 (two) times daily as needed., Disp: 10 tablet, Rfl: 2 .  sodium chloride (OCEAN) 0.65 % nasal spray, Place into the nose., Disp: , Rfl:  .  temazepam (RESTORIL) 15 MG capsule, Take 1 capsule (15 mg total) by mouth at bedtime as needed for sleep., Disp: 30 capsule, Rfl: 0 .  triamcinolone (KENALOG) 0.1 %, Apply 1 application topically 2 (two) times daily as needed., Disp: 80 g, Rfl: 0 .  Ubrogepant (UBRELVY) 100 MG TABS, Take 1 tablet by mouth every other day., Disp: 16 tablet, Rfl: 5 .  valACYclovir (VALTREX) 500 MG tablet, Take 1 tablet by mouth 2 (two) times daily., Disp: , Rfl: 0 .  cetirizine (ZYRTEC) 5 MG tablet, Take by mouth., Disp: , Rfl:   Allergies  Allergen Reactions  . Bactrim [Sulfamethoxazole-Trimethoprim] Nausea Only  . Penicillins Nausea Only  . Topamax [Topiramate] Other (See Comments)    Fatigue     I personally reviewed active problem list, medication list, allergies, family history, social history, health maintenance, notes from last encounter, lab results, imaging with the patient/caregiver today.   Review of Systems  Constitutional:  Negative for activity change, appetite change, chills, diaphoresis, fatigue, fever and unexpected weight change.  HENT:  Positive for rhinorrhea (clear nasal discharge w/o sneezing or congestion) and sore throat. Negative for congestion, drooling, ear discharge, ear pain, hoarse voice, sinus pressure, sinus pain, sneezing, trouble swallowing and voice change.   Eyes: Negative.  Negative for pain, discharge, redness and itching.  Respiratory: Negative.  Negative for cough, shortness of breath and stridor.   Cardiovascular: Negative.  Negative for chest pain, palpitations and leg swelling.  Gastrointestinal:  Negative for abdominal pain, diarrhea and vomiting.  Endocrine:  Negative.   Musculoskeletal:  Negative for neck pain.  Skin: Negative.   Allergic/Immunologic: Positive for environmental allergies. Negative for food allergies and immunocompromised state.  Neurological:  Positive for headaches (slight, history of migraines).  Hematological:  Negative for adenopathy.  All other systems reviewed and are negative.    Objective:   Virtual encounter, vitals limited, only able to obtain the following Today's Vitals   10/08/21 1059  Temp: 98 F (36.7 C)  TempSrc: Oral  Weight: 124 lb (56.2 kg)  Height: 5\' 5"  (1.651 m)   Body mass index is 20.63 kg/m. Nursing Note and Vital Signs reviewed.  Physical Exam Vitals and nursing note reviewed.  Constitutional:      General: She is not in acute distress.    Appearance: She is normal weight. She is not ill-appearing, toxic-appearing or diaphoretic.  HENT:     Head: Normocephalic and atraumatic.  Neck:     Trachea: Trachea and phonation normal.     Comments: Pt palpated anterior neck and denied any tenderness or lymphadenopathy Pulmonary:     Effort: Pulmonary effort is normal. No respiratory distress.  Skin:    General: Skin is warm and dry.     Coloration: Skin is not pale.     Findings: No rash.  Neurological:     Mental Status: She is alert.    PE limited by virtual encounter  No results found for this or any previous visit (from the past 72 hour(s)).  Assessment and Plan:     ICD-10-CM   1. Pharyngitis, unspecified etiology  J02.9    slightly runny nose, onset of sx yesterday, no fever, lymphadenopathy, myalgias, cough - tested neg for COVID x2 - supportive tx    2. Moderate persistent asthma without complication  K48.18 predniSONE (DELTASONE) 20 MG tablet   steroid rx in case URI sx cause asthma exacerbation       Offered testing for flu, strep, COVID in office but pt declined.  She did visit from work, overall feeling well, able to eat and drink, no difficulty speaking or swallowing  - swallowing in the am is painful and she feels like she has a "hairball" stuck in there - I explained that with 24 hours of mild sx and no exposure to strep antibiotics are not indicated, likely unnecessary and supportive tx for viral etiology is most appropriate   She said she will f/up Monday if not feeling better    -Red flags and when to present for emergency care or RTC including fever >101.29F, chest pain, shortness of breath, new/worsening/un-resolving symptoms, reviewed with patient at time of visit. Follow up and care instructions discussed and provided in AVS. - I discussed the assessment and treatment plan with the patient. The patient was provided an opportunity to ask questions and all were answered. The patient agreed with the plan and demonstrated an understanding of the instructions.  I provided 18 minutes of non-face-to-face time during this encounter.  Delsa Grana, PA-C 10/08/21 11:46 AM

## 2021-10-08 NOTE — Patient Instructions (Signed)
Pharyngitis ?Pharyngitis is a sore throat (pharynx). This is when there is redness, pain, and swelling in your throat. Most of the time, this condition gets better on its own. In some cases, you may need medicine. ?What are the causes? ?An infection from a virus. ?An infection from bacteria. ?Allergies. ?What increases the risk? ?Being 5-51 years old. ?Being in crowded environments. These include: ?Daycares. ?Schools. ?Dormitories. ?Living in a place with cold temperatures outside. ?Having a weakened disease-fighting (immune) system. ?What are the signs or symptoms? ?Symptoms may vary depending on the cause. Common symptoms include: ?Sore throat. ?Tiredness (fatigue). ?Low-grade fever. ?Stuffy nose. ?Cough. ?Headache. ?Other symptoms may include: ?Glands in the neck (lymph nodes) that are swollen. ?Skin rashes. ?Film on the throat or tonsils. This can be caused by an infection from bacteria. ?Vomiting. ?Red, itchy eyes. ?Loss of appetite. ?Joint pain and muscle aches. ?Tonsils that are temporarily bigger than usual (enlarged). ?How is this treated? ?Many times, treatment is not needed. This condition usually gets better in 3-4 days without treatment. ?If the infection is caused by a bacteria, you may be need to take antibiotics. ?Follow these instructions at home: ?Medicines ?Take over-the-counter and prescription medicines only as told by your doctor. ?If you were prescribed an antibiotic medicine, take it as told by your doctor. Do not stop taking the antibiotic even if you start to feel better. ?Use throat lozenges or sprays to soothe your throat as told by your doctor. ?Children can get pharyngitis. Do not give your child aspirin. ?Managing pain ?To help with pain, try: ?Sipping warm liquids, such as: ?Broth. ?Herbal tea. ?Warm water. ?Eating or drinking cold or frozen liquids, such as frozen ice pops. ?Rinsing your mouth (gargle) with a salt water mixture 3-4 times a day or as needed. ?To make salt water,  dissolve ?-1 tsp (3-6 g) of salt in 1 cup (237 mL) of warm water. ?Do not swallow this mixture. ?Sucking on hard candy or throat lozenges. ?Putting a cool-mist humidifier in your bedroom at night to moisten the air. ?Sitting in the bathroom with the door closed for 5-10 minutes while you run hot water in the shower. ? ?General instructions ? ?Do not smoke or use any products that contain nicotine or tobacco. If you need help quitting, ask your doctor. ?Rest as told by your doctor. ?Drink enough fluid to keep your pee (urine) pale yellow. ?How is this prevented? ?Wash your hands often for at least 20 seconds with soap and water. If soap and water are not available, use hand sanitizer. ?Do not touch your eyes, nose, or mouth with unwashed hands. Wash hands after touching these areas. ?Do not share cups or eating utensils. ?Avoid close contact with people who are sick. ?Contact a doctor if: ?You have large, tender lumps in your neck. ?You have a rash. ?You cough up green, yellow-brown, or bloody spit. ?Get help right away if: ?You have a stiff neck. ?You drool or cannot swallow liquids. ?You cannot drink or take medicines without vomiting. ?You have very bad pain that does not go away with medicine. ?You have problems breathing, and it is not from a stuffy nose. ?You have new pain and swelling in your knees, ankles, wrists, or elbows. ?These symptoms may be an emergency. Get help right away. Call your local emergency services (911 in the [place] ?Do not wait to see if the symptoms will go away. ?Do not drive yourself to the hospital. ?Summary ?Pharyngitis is a sore throat (pharynx). This is   when there is redness, pain, and swelling in your throat. ?Most of the time, pharyngitis gets better on its own. Sometimes, you may need medicine. ?If you were prescribed an antibiotic medicine, take it as told by your doctor. Do not stop taking the antibiotic even if you start to feel better. ?This information is not intended to  replace advice given to you by your health care provider. Make sure you discuss any questions you have with your health care provider. ?Document Revised: 02/24/2021 Document Reviewed: 02/24/2021 ?Elsevier Patient Education ? 2022 Elsevier Inc. ? ?

## 2021-10-12 ENCOUNTER — Encounter: Payer: Self-pay | Admitting: Family Medicine

## 2021-10-13 ENCOUNTER — Encounter: Payer: 59 | Admitting: Certified Nurse Midwife

## 2021-11-08 ENCOUNTER — Encounter: Payer: Self-pay | Admitting: Family Medicine

## 2021-11-08 ENCOUNTER — Other Ambulatory Visit: Payer: Self-pay

## 2021-11-08 ENCOUNTER — Other Ambulatory Visit: Payer: Self-pay | Admitting: Family Medicine

## 2021-11-08 DIAGNOSIS — J4540 Moderate persistent asthma, uncomplicated: Secondary | ICD-10-CM

## 2021-11-08 MED ORDER — PROAIR DIGIHALER 108 (90 BASE) MCG/ACT IN AEPB: 2.0000 | INHALATION_SPRAY | Freq: Four times a day (QID) | RESPIRATORY_TRACT | 1 refills | Status: DC | PRN

## 2021-11-08 MED ORDER — ALBUTEROL SULFATE HFA 108 (90 BASE) MCG/ACT IN AERS: 2.0000 | INHALATION_SPRAY | Freq: Four times a day (QID) | RESPIRATORY_TRACT | 0 refills | Status: DC | PRN

## 2021-12-08 ENCOUNTER — Encounter: Payer: 59 | Admitting: Certified Nurse Midwife

## 2021-12-30 NOTE — Progress Notes (Signed)
Name: Jessica Carroll   MRN: [id number]    DOB: [birth year]   Date:12/31/2021       Progress Note  Subjective  Chief Complaint  Annual Exam  HPI  Patient presents for annual CPE.  Diet: balanced  Exercise: working two jobs but going on the treadmill twice a week and trying to increase duration and frequency    Personnel officer Visit from 05/17/2021 in [facility]  AUDIT-C Score 0      Depression: Phq 9 is  positive Depression screen [facility] 2/9 12/31/2021 10/08/2021 10/04/2021 07/02/2021 05/17/2021  Decreased Interest 1 0 0 0 0  Down, Depressed, Hopeless 1 0 0 0 0  PHQ - 2 Score 2 0 0 0 0  Altered sleeping 1 0 3 3 0  Tired, decreased energy 1 0 0 1 0  Change in appetite 0 0 0 0 0  Feeling bad or failure about yourself  0 0 0 0 0  Trouble concentrating 0 0 0 0 0  Moving slowly or fidgety/restless 0 0 0 0 0  Suicidal thoughts 0 0 0 0 0  PHQ-9 Score 4 0 3 4 0  Difficult doing work/chores - Not difficult at all Not difficult at all - Not difficult at all  Some recent data might be hidden   Hypertension: BP Readings from Last 3 Encounters:  12/31/21 110/64  10/04/21 108/62  07/02/21 114/68   Obesity: Wt Readings from Last 3 Encounters:  12/31/21 125 lb (56.7 kg)  10/08/21 124 lb (56.2 kg)  10/04/21 124 lb 12.8 oz (56.6 kg)   BMI Readings from Last 3 Encounters:  12/31/21 20.80 kg/m  10/08/21 20.63 kg/m  10/04/21 20.77 kg/m     Vaccines:   Shingrix: 61-64 yo and ask insurance if covered when patient above 21 yo Pneumonia: educated and discussed with patient. Flu: educated and discussed with patient.  Hep C Screening: 12/24/19 STD testing and prevention (HIV/chl/gon/syphilis): 12/17/15 Intimate partner violence: negative Sexual History : one partner , using a topical medication given by gyn and seems to help with vaginal dryness  Menstrual History/LMP/Abnormal Bleeding: s/p hysterectomy  Incontinence Symptoms: no problems   Breast cancer:  -  Last Mammogram: 04/06/21 - BRCA gene screening: N/A  Osteoporosis prevention : Discussed high calcium and vitamin D supplementation, weight bearing exercises  Cervical cancer screening: N/A   Skin cancer: Discussed monitoring for atypical lesions  Colorectal cancer: 09/29/17   Lung cancer: Low Dose CT Chest recommended if Age 31-80 years, 20 pack-year currently smoking OR have quit w/in 15years. Patient does not qualify.   ECG: 05/03/10  Advanced Care Planning: A voluntary discussion about advance care planning including the explanation and discussion of advance directives.  Discussed health care proxy and Living will, and the patient was able to identify a health care proxy as mother .  Lipids: Lab Results  Component Value Date   CHOL 188 12/25/2020   CHOL 197 12/24/2019   CHOL 183 10/17/2018   Lab Results  Component Value Date   HDL 58 12/25/2020   HDL 68 12/24/2019   HDL 62 10/17/2018   Lab Results  Component Value Date   LDLCALC 116 (H) 12/25/2020   LDLCALC 118 (H) 12/24/2019   LDLCALC 108 (H) 10/17/2018   Lab Results  Component Value Date   TRIG 57 12/25/2020   TRIG 58 12/24/2019   TRIG 51 10/17/2018   Lab Results  Component Value Date   CHOLHDL 3.2 12/25/2020   CHOLHDL  2.9 12/24/2019   CHOLHDL 3.0 10/17/2018   No results found for: LDLDIRECT  Glucose: Glucose  Date Value Ref Range Status  12/24/2019 87 65 - 99 mg/dL Final   Glucose, Bld  Date Value Ref Range Status  12/25/2020 94 65 - 99 mg/dL Final    Comment:    .            Fasting reference interval .   12/06/2018 87 70 - 99 mg/dL Final  10/17/2018 82 65 - 99 mg/dL Final    Comment:    .            Fasting reference interval .     Patient Active Problem List   Diagnosis Date Noted   Age-related nuclear cataract of right eye 01/19/2021   Corneal scar, right eye 01/19/2021   Moderate episode of recurrent major depressive disorder ([place]) 12/20/2018   Iron deficiency anemia 12/19/2018    Type A blood, Rh positive 08/22/2017   S/P vaginal hysterectomy 07/31/2017   Recurrent vaginitis 01/10/2017   Hemorrhoids, external without complications 64/40/3474   Nausea in adult patient 02/24/2016   Endometriosis determined by laparoscopy 01/25/2016   Chronic constipation 08/14/2015   Bilateral ganglion cysts of wrists 08/14/2015   Mild mitral regurgitation 05/19/2015   B12 deficiency 05/18/2015   Asthma, moderate 05/18/2015   Anxiety 05/18/2015   Insomnia, persistent 05/18/2015   Eczema of hand 05/18/2015   Viral keratitis 05/18/2015   Migraine without aura and without status migrainosus, not intractable 05/18/2015   Numerous moles 05/18/2015   Allergic rhinitis 05/18/2015   History of corneal transplant 05/18/2015   Gastroesophageal reflux disease without esophagitis 12/16/2009    Past Surgical History:  Procedure Laterality Date   COLONOSCOPY WITH PROPOFOL N/A 09/29/2017   Procedure: COLONOSCOPY WITH PROPOFOL;  Surgeon: Jonathon Bellows, MD;  Location: [facility] ENDOSCOPY;  Service: Gastroenterology;  Laterality: N/A;   CORNEAL TRANSPLANT Right 01/07/2013   ESOPHAGOGASTRODUODENOSCOPY (EGD) WITH PROPOFOL N/A 09/29/2017   Procedure: ESOPHAGOGASTRODUODENOSCOPY (EGD) WITH PROPOFOL;  Surgeon: Jonathon Bellows, MD;  Location: [facility] ENDOSCOPY;  Service: Gastroenterology;  Laterality: N/A;   EYE SURGERY     eyelid surgery Right June 19, 2015   [facility] by Dr. Norlene Duel   LAPAROSCOPIC BILATERAL SALPINGECTOMY N/A 01/25/2016   Procedure: LAPAROSCOPIC BILATERAL SALPINGECTOMY, ADHESIOLYSIS, LAPAROSCOPIC EXCISION/FULGERATION ENDOMETRIOSIS ;  Surgeon: [name], MD;  Location: [facility] ORS;  Service: Gynecology;  Laterality: N/A;   Laparoscopic excision and fulguration of endometriosis N/A    bladder flap and uterosacral ligament endometriosis, excised and cauterized   UPPER GI ENDOSCOPY  07/19/2012   VAGINAL HYSTERECTOMY N/A 07/31/2017   Procedure: HYSTERECTOMY VAGINAL;  Surgeon: [name], MD;  Location: [facility] ORS;  Service: Gynecology;  Laterality: N/A;    Family History  Adopted: Yes  Problem Relation Age of Onset   Hyperlipidemia Mother    Hypertension Mother    Diabetes Father    Heart failure Father 44       hear attack   Cancer Neg Hx    Breast cancer Neg Hx     Social History   Socioeconomic History   Marital status: Single    Spouse name: Not on file   Number of children: 0   Years of education: 12   Highest education level: High school graduate  Occupational History   Occupation: textile  Tobacco Use   Smoking status: Never   Smokeless tobacco: Never  Vaping Use   Vaping Use: Never used  Substance  and Sexual Activity   Alcohol use: Not Currently    Alcohol/week: 0.0 standard drinks    Comment: occ. wine   Drug use: No   Sexual activity: Yes    Partners: Male    Birth control/protection: None, Surgical  Other Topics Concern   Not on file  Social History Narrative   Engaged to get married to her boyfriend of 14 years.   Social Determinants of Health   Financial Resource Strain: Low Risk    Difficulty of Paying Living Expenses: Not hard at all  Food Insecurity: No Food Insecurity   Worried About Charity fundraiser in the Last Year: Never true   [place] in the Last Year: Never true  Transportation Needs: No Transportation Needs   Lack of Transportation (Medical): No   Lack of Transportation (Non-Medical): No  Physical Activity: Insufficiently Active   Days of Exercise per Week: 2 days   Minutes of Exercise per Session: 10 min  Stress: Stress Concern Present   Feeling of Stress : Rather much  Social Connections: Moderately Integrated   Frequency of Communication with Friends and Family: More than three times a week   Frequency of Social Gatherings with Friends and Family: Once a week   Attends Religious Services: More than 4 times per year   Active Member of [employer] or Organizations: No   Attends Arts administrator: Never   Marital Status: Living with partner  Intimate Partner Violence: Not At Risk   Fear of Current or Ex-Partner: No   Emotionally Abused: No   Physically Abused: No   Sexually Abused: No     Current Outpatient Medications:    albuterol (VENTOLIN HFA) 108 (90 Base) MCG/ACT inhaler, Inhale 2 puffs into the lungs every 6 (six) hours as needed for wheezing or shortness of breath., Disp: 18 g, Rfl: 0   carboxymethylcellul-glycerin (REFRESH OPTIVE) 0.5-0.9 % ophthalmic solution, Frequency:PHARMDIR   Dosage:0.0     Instructions:  Note:6 x OU Dose: 0.5%-0.9%, Disp: , Rfl:    conjugated estrogens (PREMARIN) vaginal cream, Place 1 Applicatorful vaginally daily. For the first 2 weeks after that a few times a week - pea size on urethra and vaginal entry only, Disp: 42.5 g, Rfl: 12   cyclobenzaprine (FLEXERIL) 5 MG tablet, Take 1-2 tablets (5-10 mg total) by mouth 3 (three) times daily as needed for muscle spasms. Take mainly at bedtime as may make drowsy, do not take before driving., Disp: 60 tablet, Rfl: 0   fluticasone-salmeterol (ADVAIR) 100-50 MCG/ACT AEPB, Inhale 1 puff into the lungs 2 (two) times daily., Disp: 1 each, Rfl: 5   ibuprofen (ADVIL) 800 MG tablet, TAKE 1 TABLET BY MOUTH EVERY 8 HOURS AS NEEDED, Disp: 30 tablet, Rfl: 0   montelukast (SINGULAIR) 10 MG tablet, Take 1 tablet (10 mg total) by mouth at bedtime., Disp: 90 tablet, Rfl: 1   MULTIPLE VITAMIN PO, Take 1 tablet by mouth daily., Disp: , Rfl:    pantoprazole (PROTONIX) 40 MG tablet, Take 1 tablet (40 mg total) by mouth 2 (two) times daily before a meal., Disp: 180 tablet, Rfl: 1   Plecanatide 3 MG TABS, Take 1 tablet by mouth daily., Disp: 90 tablet, Rfl: 3   prednisoLONE acetate (PRED FORTE) 1 % ophthalmic suspension, Place 1 drop into the right eye daily., Disp: , Rfl:    predniSONE (DELTASONE) 20 MG tablet, 2 tabs poqday 1-3, 1 tabs poqday 4-6, Disp: 9 tablet, Rfl: 0   promethazine (  PHENERGAN) 12.5 MG tablet, Take 1  tablet (12.5 mg total) by mouth every 8 (eight) hours as needed for nausea or vomiting., Disp: 20 tablet, Rfl: 0   rizatriptan (MAXALT) 10 MG tablet, Take 1 tablet (10 mg total) by mouth 2 (two) times daily as needed., Disp: 10 tablet, Rfl: 2   sodium chloride (OCEAN) 0.65 % nasal spray, Place into the nose., Disp: , Rfl:    temazepam (RESTORIL) 15 MG capsule, Take 1 capsule (15 mg total) by mouth at bedtime as needed for sleep., Disp: 30 capsule, Rfl: 0   triamcinolone (KENALOG) 0.1 %, Apply 1 application topically 2 (two) times daily as needed., Disp: 80 g, Rfl: 0   Ubrogepant (UBRELVY) 100 MG TABS, Take 1 tablet by mouth every other day., Disp: 16 tablet, Rfl: 5   valACYclovir (VALTREX) 500 MG tablet, Take 1 tablet by mouth 2 (two) times daily., Disp: , Rfl: 0   cetirizine (ZYRTEC) 5 MG tablet, Take by mouth., Disp: , Rfl:   Allergies  Allergen Reactions   Bactrim [Sulfamethoxazole-Trimethoprim] Nausea Only   Penicillins Nausea Only   Topamax [Topiramate] Other (See Comments)    Fatigue      ROS  Constitutional: Negative for fever or weight change.  Respiratory: Negative for cough and shortness of breath.   Cardiovascular: Negative for chest pain or palpitations.  Gastrointestinal: Negative for abdominal pain, no bowel changes.  Musculoskeletal: Negative for gait problem or joint swelling.  Skin: Negative for rash.  Neurological: Negative for dizziness , positive for intermittent headache.  No other specific complaints in a complete review of systems (except as listed in HPI above).   Objective  Vitals:   12/31/21 0750  BP: 110/64  Pulse: 68  Resp: 16  Temp: 98.1 F (36.7 C)  SpO2: 99%  Weight: 125 lb (56.7 kg)  Height: '5\' 5"'  (1.651 m)    Body mass index is 20.8 kg/m.  Physical Exam  Constitutional: Patient appears well-developed and well-nourished. No distress.  HENT: Head: Normocephalic and atraumatic. Ears: B TMs ok, no erythema or effusion; Nose: Not done .  Mouth/Throat: not done Eyes: Conjunctivae and EOM are normal. Pupils are equal, round, and reactive to light. No scleral icterus.  Neck: Normal range of motion. Neck supple. No JVD present. No thyromegaly present.  Cardiovascular: Normal rate, regular rhythm and normal heart sounds.  No murmur heard. No BLE edema. Pulmonary/Chest: Effort normal and breath sounds normal. No respiratory distress. Abdominal: Soft. Bowel sounds are normal, no distension. There is no tenderness. no masses Breast: stable lump on right breast, also a smaller on left , she is due for mammogram, she had diagnostic study in April and it was benign. We will add views if necessary  FEMALE GENITALIA:  Not done  RECTAL: not done  Musculoskeletal: Normal range of motion, no joint effusions. No gross deformities Neurological: he is alert and oriented to person, place, and time. No cranial nerve deficit. Coordination, balance, strength, speech and gait are normal.  Skin: Skin is warm and dry. No rash noted. No erythema.  Psychiatric: Patient has a normal mood and affect. behavior is normal. Judgment and thought content normal.   Fall Risk: Fall Risk  12/31/2021 10/08/2021 10/04/2021 07/02/2021 05/17/2021  Falls in the past year? 0 0 0 0 0  Number falls in past yr: 0 0 0 0 0  Injury with Fall? 0 0 0 0 0  Risk for fall due to : No Fall Risks No Fall Risks No Fall  Risks - -  Follow up Falls prevention discussed Falls prevention discussed Falls prevention discussed - Falls evaluation completed     Functional Status Survey: Is the patient deaf or have difficulty hearing?: No Does the patient have difficulty seeing, even when wearing glasses/contacts?: No Does the patient have difficulty concentrating, remembering, or making decisions?: No Does the patient have difficulty walking or climbing stairs?: No Does the patient have difficulty dressing or bathing?: No Does the patient have difficulty doing errands alone such as visiting  a doctor's office or shopping?: No   Assessment & Plan  1. Well adult exam  - Lipid panel - CBC with Differential/Platelet - B12 and Folate Panel - COMPLETE METABOLIC PANEL WITH GFR - Hemoglobin A1c  2. Lipid screening  - Lipid panel  3. Diabetes mellitus screening  - Hemoglobin A1c  4. Long-term use of high-risk medication  - CBC with Differential/Platelet - COMPLETE METABOLIC PANEL WITH GFR  5. B12 deficiency  - B12 and Folate Panel  6. History of iron deficiency anemia  - CBC with Differential/Platelet  7. Breast cancer screening by mammogram  - MM 3D SCREEN BREAST BILATERAL; Future  8. Need for shingles vaccine   She will call insurance   -USPSTF grade A and B recommendations reviewed with patient; age-appropriate recommendations, preventive care, screening tests, etc discussed and encouraged; healthy living encouraged; see AVS for patient education given to patient -Discussed importance of 150 minutes of physical activity weekly, eat two servings of fish weekly, eat one serving of tree nuts ( cashews, pistachios, pecans, almonds.Marland Kitchen) every other day, eat 6 servings of fruit/vegetables daily and drink plenty of water and avoid sweet beverages.

## 2021-12-31 ENCOUNTER — Other Ambulatory Visit: Payer: Self-pay

## 2021-12-31 ENCOUNTER — Encounter: Payer: Self-pay | Admitting: Family Medicine

## 2021-12-31 ENCOUNTER — Ambulatory Visit (INDEPENDENT_AMBULATORY_CARE_PROVIDER_SITE_OTHER): Payer: 59 | Admitting: Family Medicine

## 2021-12-31 VITALS — BP 110/64 | HR 68 | Temp 98.1°F | Resp 16 | Ht 65.0 in | Wt 125.0 lb

## 2021-12-31 DIAGNOSIS — Z79899 Other long term (current) drug therapy: Secondary | ICD-10-CM

## 2021-12-31 DIAGNOSIS — Z23 Encounter for immunization: Secondary | ICD-10-CM

## 2021-12-31 DIAGNOSIS — Z131 Encounter for screening for diabetes mellitus: Secondary | ICD-10-CM | POA: Diagnosis not present

## 2021-12-31 DIAGNOSIS — Z1231 Encounter for screening mammogram for malignant neoplasm of breast: Secondary | ICD-10-CM

## 2021-12-31 DIAGNOSIS — Z0000 Encounter for general adult medical examination without abnormal findings: Secondary | ICD-10-CM | POA: Diagnosis not present

## 2021-12-31 DIAGNOSIS — Z13220 Encounter for screening for lipoid disorders: Secondary | ICD-10-CM | POA: Diagnosis not present

## 2021-12-31 DIAGNOSIS — E538 Deficiency of other specified B group vitamins: Secondary | ICD-10-CM

## 2021-12-31 DIAGNOSIS — Z862 Personal history of diseases of the blood and blood-forming organs and certain disorders involving the immune mechanism: Secondary | ICD-10-CM

## 2021-12-31 NOTE — Patient Instructions (Signed)

## 2022-01-01 LAB — CBC WITH DIFFERENTIAL/PLATELET
Absolute Monocytes: 367 cells/uL (ref 200–950)
Basophils Absolute: 52 cells/uL (ref 0–200)
Basophils Relative: 1.1 %
Eosinophils Absolute: 108 cells/uL (ref 15–500)
Eosinophils Relative: 2.3 %
HCT: 35.9 % (ref 35.0–45.0)
Hemoglobin: 11.6 g/dL — ABNORMAL LOW (ref 11.7–15.5)
Lymphs Abs: 1885 cells/uL (ref 850–3900)
MCH: 29.7 pg (ref 27.0–33.0)
MCHC: 32.3 g/dL (ref 32.0–36.0)
MCV: 91.8 fL (ref 80.0–100.0)
MPV: 12.2 fL (ref 7.5–12.5)
Monocytes Relative: 7.8 %
Neutro Abs: 2289 cells/uL (ref 1500–7800)
Neutrophils Relative %: 48.7 %
Platelets: 197 10*3/uL (ref 140–400)
RBC: 3.91 10*6/uL (ref 3.80–5.10)
RDW: 12.3 % (ref 11.0–15.0)
Total Lymphocyte: 40.1 %
WBC: 4.7 10*3/uL (ref 3.8–10.8)

## 2022-01-01 LAB — COMPLETE METABOLIC PANEL WITH GFR
AG Ratio: 1.8 (calc) (ref 1.0–2.5)
ALT: 12 U/L (ref 6–29)
AST: 14 U/L (ref 10–35)
Albumin: 4.4 g/dL (ref 3.6–5.1)
Alkaline phosphatase (APISO): 38 U/L (ref 37–153)
BUN: 11 mg/dL (ref 7–25)
CO2: 27 mmol/L (ref 20–32)
Calcium: 9.3 mg/dL (ref 8.6–10.4)
Chloride: 105 mmol/L (ref 98–110)
Creat: 0.71 mg/dL (ref 0.50–1.03)
Globulin: 2.4 g/dL (calc) (ref 1.9–3.7)
Glucose, Bld: 80 mg/dL (ref 65–99)
Potassium: 4 mmol/L (ref 3.5–5.3)
Sodium: 139 mmol/L (ref 135–146)
Total Bilirubin: 1.4 mg/dL — ABNORMAL HIGH (ref 0.2–1.2)
Total Protein: 6.8 g/dL (ref 6.1–8.1)
eGFR: 103 mL/min/{1.73_m2} (ref >=60)

## 2022-01-01 LAB — B12 AND FOLATE PANEL
Folate: 14.9 ng/mL
Vitamin B-12: 352 pg/mL (ref 200–1100)

## 2022-01-01 LAB — HEMOGLOBIN A1C
Hgb A1c MFr Bld: 5.4 % of total Hgb (ref <5.7)
Mean Plasma Glucose: 108 mg/dL
eAG (mmol/L): 6 mmol/L

## 2022-01-01 LAB — LIPID PANEL
Cholesterol: 201 mg/dL — ABNORMAL HIGH (ref <200)
HDL: 64 mg/dL (ref >=50)
LDL Cholesterol (Calc): 120 mg/dL (calc) — ABNORMAL HIGH
Non-HDL Cholesterol (Calc): 137 mg/dL (calc) — ABNORMAL HIGH (ref <130)
Total CHOL/HDL Ratio: 3.1 (calc) (ref <5.0)
Triglycerides: 71 mg/dL (ref <150)

## 2022-01-11 ENCOUNTER — Encounter: Payer: Self-pay | Admitting: Family Medicine

## 2022-01-11 ENCOUNTER — Other Ambulatory Visit: Payer: Self-pay

## 2022-01-11 DIAGNOSIS — R928 Other abnormal and inconclusive findings on diagnostic imaging of breast: Secondary | ICD-10-CM

## 2022-01-13 ENCOUNTER — Other Ambulatory Visit: Payer: Self-pay | Admitting: Family Medicine

## 2022-01-13 DIAGNOSIS — N63 Unspecified lump in unspecified breast: Secondary | ICD-10-CM

## 2022-02-01 ENCOUNTER — Encounter: Payer: Self-pay | Admitting: Family Medicine

## 2022-02-01 ENCOUNTER — Other Ambulatory Visit: Payer: Self-pay

## 2022-02-01 ENCOUNTER — Other Ambulatory Visit: Payer: Self-pay | Admitting: Family Medicine

## 2022-02-01 DIAGNOSIS — K219 Gastro-esophageal reflux disease without esophagitis: Secondary | ICD-10-CM

## 2022-02-01 DIAGNOSIS — R1013 Epigastric pain: Secondary | ICD-10-CM

## 2022-02-01 MED ORDER — PANTOPRAZOLE SODIUM 40 MG PO TBEC: 40.0000 mg | DELAYED_RELEASE_TABLET | Freq: Two times a day (BID) | ORAL | 1 refills | Status: DC

## 2022-02-06 ENCOUNTER — Other Ambulatory Visit: Payer: Self-pay | Admitting: Family Medicine

## 2022-02-06 DIAGNOSIS — F5104 Psychophysiologic insomnia: Secondary | ICD-10-CM

## 2022-02-07 MED ORDER — TEMAZEPAM 15 MG PO CAPS: 15.0000 mg | ORAL_CAPSULE | Freq: Every evening | ORAL | 0 refills | Status: DC | PRN

## 2022-02-13 ENCOUNTER — Other Ambulatory Visit: Payer: Self-pay | Admitting: Family Medicine

## 2022-02-13 DIAGNOSIS — G43009 Migraine without aura, not intractable, without status migrainosus: Secondary | ICD-10-CM

## 2022-02-14 MED ORDER — IBUPROFEN 800 MG PO TABS: 800.0000 mg | ORAL_TABLET | Freq: Three times a day (TID) | ORAL | 0 refills | Status: DC | PRN

## 2022-02-22 ENCOUNTER — Encounter: Payer: Self-pay | Admitting: Nurse Practitioner

## 2022-02-22 ENCOUNTER — Other Ambulatory Visit: Payer: Self-pay

## 2022-02-22 ENCOUNTER — Telehealth (INDEPENDENT_AMBULATORY_CARE_PROVIDER_SITE_OTHER): Payer: 59 | Admitting: Nurse Practitioner

## 2022-02-22 DIAGNOSIS — B379 Candidiasis, unspecified: Secondary | ICD-10-CM

## 2022-02-22 DIAGNOSIS — J0140 Acute pansinusitis, unspecified: Secondary | ICD-10-CM

## 2022-02-22 DIAGNOSIS — T3695XA Adverse effect of unspecified systemic antibiotic, initial encounter: Secondary | ICD-10-CM | POA: Diagnosis not present

## 2022-02-22 MED ORDER — FLUCONAZOLE 150 MG PO TABS
150.0000 mg | ORAL_TABLET | Freq: Once | ORAL | 0 refills | Status: AC
Start: 2022-02-22 — End: 2022-02-22

## 2022-02-22 MED ORDER — DOXYCYCLINE HYCLATE 100 MG PO TABS: 100.0000 mg | ORAL_TABLET | Freq: Two times a day (BID) | ORAL | 0 refills | Status: AC

## 2022-02-22 NOTE — Progress Notes (Signed)
? ?Name: Jessica Carroll   MRN: [id number]    DOB: October 05, 1971   Date:02/22/2022 ? ?     Progress Note ? ?Subjective ? ?Chief Complaint ? ?Chief Complaint  ?Patient presents with  ? Sinusitis  ? ? ?I connected with  [name]  on 02/22/22 at  3:00 PM EDT by a video enabled telemedicine application and verified that I am speaking with the correct person using two identifiers.  I discussed the limitations of evaluation and management by telemedicine and the availability of in person appointments. The patient expressed understanding and agreed to proceed with a virtual visit  Staff also discussed with the patient that there may be a patient responsible charge related to this service. ?Patient Location: home ?Provider Location: [facility] ?Additional Individuals present: alone ? ?HPI ? ?Sinus infection: She says for a couple of weeks she has been having facial pain and her teeth started hurting her. So she went to her dentist and they did an xray and told her it was not her teeth causing her pain that she had a sinus infection.  She says she has a lot of tenderness to her face and swelling noted in the morning.  She denies any fever or cough.  Discussed using warm compresses across her face, pushing fluids and will send in antibiotic for sinus infection.  She says she does get a yeast infection with antibiotics, will send in diflucan.  ? ?Patient Active Problem List  ? Diagnosis Date Noted  ? Age-related nuclear cataract of right eye 01/19/2021  ? Corneal scar, right eye 01/19/2021  ? Moderate episode of recurrent major depressive disorder ([place]) 12/20/2018  ? Iron deficiency anemia 12/19/2018  ? Type A blood, Rh positive 08/22/2017  ? S/P vaginal hysterectomy 07/31/2017  ? Recurrent vaginitis 01/10/2017  ? Hemorrhoids, external without complications 40/98/1191  ? Nausea in adult patient 02/24/2016  ? Endometriosis determined by laparoscopy 01/25/2016  ? Chronic constipation 08/14/2015  ? Bilateral ganglion cysts of wrists  08/14/2015  ? Mild mitral regurgitation 05/19/2015  ? B12 deficiency 05/18/2015  ? Asthma, moderate 05/18/2015  ? Anxiety 05/18/2015  ? Insomnia, persistent 05/18/2015  ? Eczema of hand 05/18/2015  ? Viral keratitis 05/18/2015  ? Migraine without aura and without status migrainosus, not intractable 05/18/2015  ? Numerous moles 05/18/2015  ? Allergic rhinitis 05/18/2015  ? History of corneal transplant 05/18/2015  ? Gastroesophageal reflux disease without esophagitis 12/16/2009  ? ? ?Social History  ? ?Tobacco Use  ? Smoking status: Never  ? Smokeless tobacco: Never  ?Substance Use Topics  ? Alcohol use: Not Currently  ?  Alcohol/week: 0.0 standard drinks  ?  Comment: occ. wine  ? ? ? ?Current Outpatient Medications:  ?  albuterol (VENTOLIN HFA) 108 (90 Base) MCG/ACT inhaler, Inhale 2 puffs into the lungs every 6 (six) hours as needed for wheezing or shortness of breath., Disp: 18 g, Rfl: 0 ?  carboxymethylcellul-glycerin (REFRESH OPTIVE) 0.5-0.9 % ophthalmic solution, Frequency:PHARMDIR   Dosage:0.0     Instructions:  Note:6 x OU Dose: 0.5%-0.9%, Disp: , Rfl:  ?  conjugated estrogens (PREMARIN) vaginal cream, Place 1 Applicatorful vaginally daily. For the first 2 weeks after that a few times a week - pea size on urethra and vaginal entry only, Disp: 42.5 g, Rfl: 12 ?  cyclobenzaprine (FLEXERIL) 5 MG tablet, Take 1-2 tablets (5-10 mg total) by mouth 3 (three) times daily as needed for muscle spasms. Take mainly at bedtime as may make drowsy, do not  take before driving., Disp: 60 tablet, Rfl: 0 ?  fluticasone-salmeterol (ADVAIR) 100-50 MCG/ACT AEPB, Inhale 1 puff into the lungs 2 (two) times daily., Disp: 1 each, Rfl: 5 ?  ibuprofen (ADVIL) 800 MG tablet, Take 1 tablet (800 mg total) by mouth every 8 (eight) hours as needed., Disp: 30 tablet, Rfl: 0 ?  montelukast (SINGULAIR) 10 MG tablet, Take 1 tablet (10 mg total) by mouth at bedtime., Disp: 90 tablet, Rfl: 1 ?  MULTIPLE VITAMIN PO, Take 1 tablet by mouth daily.,  Disp: , Rfl:  ?  pantoprazole (PROTONIX) 40 MG tablet, Take 1 tablet (40 mg total) by mouth 2 (two) times daily before a meal., Disp: 180 tablet, Rfl: 1 ?  Plecanatide 3 MG TABS, Take 1 tablet by mouth daily., Disp: 90 tablet, Rfl: 3 ?  prednisoLONE acetate (PRED FORTE) 1 % ophthalmic suspension, Place 1 drop into the right eye daily., Disp: , Rfl:  ?  promethazine (PHENERGAN) 12.5 MG tablet, Take 1 tablet (12.5 mg total) by mouth every 8 (eight) hours as needed for nausea or vomiting., Disp: 20 tablet, Rfl: 0 ?  rizatriptan (MAXALT) 10 MG tablet, Take 1 tablet (10 mg total) by mouth 2 (two) times daily as needed., Disp: 10 tablet, Rfl: 2 ?  sodium chloride (OCEAN) 0.65 % nasal spray, Place into the nose., Disp: , Rfl:  ?  temazepam (RESTORIL) 15 MG capsule, Take 1 capsule (15 mg total) by mouth at bedtime as needed for sleep., Disp: 30 capsule, Rfl: 0 ?  triamcinolone (KENALOG) 0.1 %, Apply 1 application topically 2 (two) times daily as needed., Disp: 80 g, Rfl: 0 ?  Ubrogepant (UBRELVY) 100 MG TABS, Take 1 tablet by mouth every other day., Disp: 16 tablet, Rfl: 5 ?  valACYclovir (VALTREX) 500 MG tablet, Take 1 tablet by mouth 2 (two) times daily., Disp: , Rfl: 0 ?  cetirizine (ZYRTEC) 5 MG tablet, Take by mouth., Disp: , Rfl:  ? ?Allergies  ?Allergen Reactions  ? Bactrim [Sulfamethoxazole-Trimethoprim] Nausea Only  ? Penicillins Nausea Only  ? Topamax [Topiramate] Other (See Comments)  ?  Fatigue   ? ? ?I personally reviewed active problem list, medication list, allergies, notes from last encounter with the patient/caregiver today. ? ?ROS ? ?Constitutional: Negative for fever or weight change.  ?HEENT: positive for nasal congestion and facial pressure ?Respiratory: Negative for cough and shortness of breath.   ?Cardiovascular: Negative for chest pain or palpitations.  ?Gastrointestinal: Negative for abdominal pain, no bowel changes.  ?Musculoskeletal: Negative for gait problem or joint swelling.  ?Skin: Negative  for rash.  ?Neurological: Negative for dizziness or headache.  ?No other specific complaints in a complete review of systems (except as listed in HPI above).  ? ?Objective ? ?Virtual encounter, vitals not obtained. ? ?There is no height or weight on file to calculate BMI. ? ?Nursing Note and Vital Signs reviewed. ? ?Physical Exam ? ?Awake alert and oriented, speaking in complete sentences ? ?No results found for this or any previous visit (from the past 72 hour(s)). ? ?Assessment & Plan ? ?1. Acute non-recurrent pansinusitis ?-push fluids, warm compresses to face ?- doxycycline (VIBRA-TABS) 100 MG tablet; Take 1 tablet (100 mg total) by mouth 2 (two) times daily for 10 days.  Dispense: 20 tablet; Refill: 0 ? ?2. Antibiotic-induced yeast infection ? ?- fluconazole (DIFLUCAN) 150 MG tablet; Take 1 tablet (150 mg total) by mouth once for 1 dose.  Dispense: 1 tablet; Refill: 0  ? ?-Red flags and when to  present for emergency care or RTC including fever >101.8F, chest pain, shortness of breath, new/worsening/un-resolving symptoms,  reviewed with patient at time of visit. Follow up and care instructions discussed and provided in AVS. ?- I discussed the assessment and treatment plan with the patient. The patient was provided an opportunity to ask questions and all were answered. The patient agreed with the plan and demonstrated an understanding of the instructions. ? ?I provided 15 minutes of non-face-to-face time during this encounter. ? ?Bo Merino, FNP ? ?  ?

## 2022-03-07 ENCOUNTER — Encounter: Payer: Self-pay | Admitting: Nurse Practitioner

## 2022-03-07 ENCOUNTER — Other Ambulatory Visit: Payer: Self-pay | Admitting: Nurse Practitioner

## 2022-03-07 DIAGNOSIS — B379 Candidiasis, unspecified: Secondary | ICD-10-CM

## 2022-03-07 MED ORDER — FLUCONAZOLE 150 MG PO TABS: 150.0000 mg | ORAL_TABLET | ORAL | 0 refills | Status: DC | PRN

## 2022-03-28 ENCOUNTER — Ambulatory Visit
Admission: RE | Admit: 2022-03-28 | Discharge: 2022-03-28 | Disposition: A | Discharge status: Home / Self Care | Payer: 59 | Source: Ambulatory Visit | Attending: Family Medicine | Admitting: Family Medicine

## 2022-03-28 ENCOUNTER — Other Ambulatory Visit: Payer: Self-pay | Admitting: Family Medicine

## 2022-03-28 DIAGNOSIS — N63 Unspecified lump in unspecified breast: Secondary | ICD-10-CM | POA: Diagnosis present

## 2022-03-29 ENCOUNTER — Other Ambulatory Visit: Payer: Self-pay

## 2022-03-29 DIAGNOSIS — R928 Other abnormal and inconclusive findings on diagnostic imaging of breast: Secondary | ICD-10-CM

## 2022-03-31 NOTE — Progress Notes (Signed)
Name: Jessica Carroll   MRN: [id number]    DOB: 29-Nov-1971   Date:04/01/2022 ? ?     Progress Note ? ?Subjective ? ?Chief Complaint ? ?Follow Up ? ?HPI ? ?Depression/anxiety/Insomnia: phq 9 has improved, stress is down since her step son no longer living with them. He was placed in a group home since Summer of 2022  She states mind is always busy at night , wakes up and cannot fall back asleep. Trazodone causes a morning headache. We gave her Temazepam and seems to work but she is walking up with hot flashes and not able to fall back asleep.  ? ?Vagina itching: started a few days ago, initially just itching but over the past day also has some dysuria , no blood , back or pelvic pain, no fever or chills. No discharge. She has a history of yeast infections and feels just like it  ? ?Menopause: she weaned self off estradiol because she was having daily headache and has resolved since she stopped medication, she is now taking [employer], she is also having mood swings, night sweats and irritability. Discussed trying Effexor again and she is willing to try it.  ? ?Chronic constipation and recently diagnosed with IBS by Dr. Vicente Males, she is now on Trulance and it has helped with bloating and pain, symptoms have been controlled. She has bowel movements daily now  ?  ?Asthma moderated: she was on Breo but too costly and is back on Advair and singulair and seems to be controlling her symptoms. She denies cough, wheezing or decrease in exercise tolerance  ?  ?GERD:  She is now on Protonix BID , dexilant stopped working. She states symptoms are under control , unchanged  ?  ?History of iron deficiency and B12 deficiency: Symptoms improved since she got iron infusion. Last ferritin, also B12 improved with supplementation . Last HCT stable at 11.6  ?  ?Migraine: She is doing better on Ubrelvy every other day and Maxalt prn once every couple of weeks. Migraine has been better controlled with current regiment, could not tolerate  Topamax in the past. Migraine is associated with visual aura, episodes used to be severe associated with photophobia, phenophobia and nausea . Very seldom missing work since started Ubrelvy Fall of 2022 ? ? ?Patient Active Problem List  ? Diagnosis Date Noted  ? Age-related nuclear cataract of right eye 01/19/2021  ? Corneal scar, right eye 01/19/2021  ? Moderate episode of recurrent major depressive disorder ([place]) 12/20/2018  ? Iron deficiency anemia 12/19/2018  ? Type A blood, Rh positive 08/22/2017  ? S/P vaginal hysterectomy 07/31/2017  ? Recurrent vaginitis 01/10/2017  ? Hemorrhoids, external without complications 35/32/9924  ? Nausea in adult patient 02/24/2016  ? Endometriosis determined by laparoscopy 01/25/2016  ? Chronic constipation 08/14/2015  ? Bilateral ganglion cysts of wrists 08/14/2015  ? Mild mitral regurgitation 05/19/2015  ? B12 deficiency 05/18/2015  ? Asthma, moderate 05/18/2015  ? Anxiety 05/18/2015  ? Insomnia, persistent 05/18/2015  ? Eczema of hand 05/18/2015  ? Viral keratitis 05/18/2015  ? Migraine without aura and without status migrainosus, not intractable 05/18/2015  ? Numerous moles 05/18/2015  ? Allergic rhinitis 05/18/2015  ? History of corneal transplant 05/18/2015  ? Gastroesophageal reflux disease without esophagitis 12/16/2009  ? ? ?Past Surgical History:  ?Procedure Laterality Date  ? COLONOSCOPY WITH PROPOFOL N/A 09/29/2017  ? Procedure: COLONOSCOPY WITH PROPOFOL;  Surgeon: Jonathon Bellows, MD;  Location: [facility] ENDOSCOPY;  Service: Gastroenterology;  Laterality: N/A;  ?  CORNEAL TRANSPLANT Right 01/07/2013  ? ESOPHAGOGASTRODUODENOSCOPY (EGD) WITH PROPOFOL N/A 09/29/2017  ? Procedure: ESOPHAGOGASTRODUODENOSCOPY (EGD) WITH PROPOFOL;  Surgeon: Jonathon Bellows, MD;  Location: [facility] ENDOSCOPY;  Service: Gastroenterology;  Laterality: N/A;  ? EYE SURGERY    ? eyelid surgery Right June 19, 2015  ? [facility] by Dr. Norlene Duel  ? LAPAROSCOPIC BILATERAL SALPINGECTOMY N/A 01/25/2016  ? Procedure:  LAPAROSCOPIC BILATERAL SALPINGECTOMY, ADHESIOLYSIS, LAPAROSCOPIC EXCISION/FULGERATION ENDOMETRIOSIS ;  Surgeon: [name], MD;  Location: [facility] ORS;  Service: Gynecology;  Laterality: N/A;  ? Laparoscopic excision and fulguration of endometriosis N/A   ? bladder flap and uterosacral ligament endometriosis, excised and cauterized  ? UPPER GI ENDOSCOPY  07/19/2012  ? VAGINAL HYSTERECTOMY N/A 07/31/2017  ? Procedure: HYSTERECTOMY VAGINAL;  Surgeon: [name], MD;  Location: [facility] ORS;  Service: Gynecology;  Laterality: N/A;  ? ? ?Family History  ?Adopted: Yes  ?Problem Relation Age of Onset  ? Hyperlipidemia Mother   ? Hypertension Mother   ? Diabetes Father   ? Heart failure Father 42  ?     hear attack  ? Cancer Neg Hx   ? Breast cancer Neg Hx   ? ? ?Social History  ? ?Tobacco Use  ? Smoking status: Never  ? Smokeless tobacco: Never  ?Substance Use Topics  ? Alcohol use: Not Currently  ?  Alcohol/week: 0.0 standard drinks  ?  Comment: occ. wine  ? ? ? ?Current Outpatient Medications:  ?  albuterol (VENTOLIN HFA) 108 (90 Base) MCG/ACT inhaler, Inhale 2 puffs into the lungs every 6 (six) hours as needed for wheezing or shortness of breath., Disp: 18 g, Rfl: 0 ?  carboxymethylcellul-glycerin (REFRESH OPTIVE) 0.5-0.9 % ophthalmic solution, Frequency:PHARMDIR   Dosage:0.0     Instructions:  Note:6 x OU Dose: 0.5%-0.9%, Disp: , Rfl:  ?  conjugated estrogens (PREMARIN) vaginal cream, Place 1 Applicatorful vaginally daily. For the first 2 weeks after that a few times a week - pea size on urethra and vaginal entry only, Disp: 42.5 g, Rfl: 12 ?  cyclobenzaprine (FLEXERIL) 5 MG tablet, Take 1-2 tablets (5-10 mg total) by mouth 3 (three) times daily as needed for muscle spasms. Take mainly at bedtime as may make drowsy, do not take before driving., Disp: 60 tablet, Rfl: 0 ?  fluticasone-salmeterol (ADVAIR) 100-50 MCG/ACT AEPB, Inhale 1 puff into the lungs 2 (two) times daily., Disp: 1 each, Rfl: 5 ?   ibuprofen (ADVIL) 800 MG tablet, Take 1 tablet (800 mg total) by mouth every 8 (eight) hours as needed., Disp: 30 tablet, Rfl: 0 ?  montelukast (SINGULAIR) 10 MG tablet, Take 1 tablet (10 mg total) by mouth at bedtime., Disp: 90 tablet, Rfl: 1 ?  MULTIPLE VITAMIN PO, Take 1 tablet by mouth daily., Disp: , Rfl:  ?  pantoprazole (PROTONIX) 40 MG tablet, Take 1 tablet (40 mg total) by mouth 2 (two) times daily before a meal., Disp: 180 tablet, Rfl: 1 ?  Plecanatide 3 MG TABS, Take 1 tablet by mouth daily., Disp: 90 tablet, Rfl: 3 ?  prednisoLONE acetate (PRED FORTE) 1 % ophthalmic suspension, Place 1 drop into the right eye daily., Disp: , Rfl:  ?  promethazine (PHENERGAN) 12.5 MG tablet, Take 1 tablet (12.5 mg total) by mouth every 8 (eight) hours as needed for nausea or vomiting., Disp: 20 tablet, Rfl: 0 ?  rizatriptan (MAXALT) 10 MG tablet, Take 1 tablet (10 mg total) by mouth 2 (two) times daily as needed., Disp: 10 tablet, Rfl:  2 ?  temazepam (RESTORIL) 15 MG capsule, Take 1 capsule (15 mg total) by mouth at bedtime as needed for sleep., Disp: 30 capsule, Rfl: 0 ?  triamcinolone (KENALOG) 0.1 %, Apply 1 application topically 2 (two) times daily as needed., Disp: 80 g, Rfl: 0 ?  Ubrogepant (UBRELVY) 100 MG TABS, Take 1 tablet by mouth every other day., Disp: 16 tablet, Rfl: 5 ?  valACYclovir (VALTREX) 500 MG tablet, Take 1 tablet by mouth 2 (two) times daily., Disp: , Rfl: 0 ?  cetirizine (ZYRTEC) 5 MG tablet, Take by mouth., Disp: , Rfl:  ? ?Allergies  ?Allergen Reactions  ? Bactrim [Sulfamethoxazole-Trimethoprim] Nausea Only  ? Penicillins Nausea Only  ? Topamax [Topiramate] Other (See Comments)  ?  Fatigue   ? ? ?I personally reviewed active problem list, medication list, allergies, family history, social history, health maintenance with the patient/caregiver today. ? ? ?ROS ? ?Constitutional: Negative for fever or weight change.  ?Respiratory: Negative for cough and shortness of breath.   ?Cardiovascular:  Negative for chest pain or palpitations.  ?Gastrointestinal: Negative for abdominal pain, no bowel changes.  ?Musculoskeletal: Negative for gait problem or joint swelling.  ?Skin: Negative for rash.  ?Neurological: Negat

## 2022-04-01 ENCOUNTER — Ambulatory Visit: Payer: 59 | Admitting: Family Medicine

## 2022-04-01 ENCOUNTER — Encounter: Payer: Self-pay | Admitting: Family Medicine

## 2022-04-01 VITALS — BP 116/68 | HR 74 | Resp 16 | Ht 65.0 in | Wt 119.0 lb

## 2022-04-01 DIAGNOSIS — G43109 Migraine with aura, not intractable, without status migrainosus: Secondary | ICD-10-CM | POA: Diagnosis not present

## 2022-04-01 DIAGNOSIS — K219 Gastro-esophageal reflux disease without esophagitis: Secondary | ICD-10-CM

## 2022-04-01 DIAGNOSIS — F5104 Psychophysiologic insomnia: Secondary | ICD-10-CM

## 2022-04-01 DIAGNOSIS — F330 Major depressive disorder, recurrent, mild: Secondary | ICD-10-CM | POA: Diagnosis not present

## 2022-04-01 DIAGNOSIS — M62830 Muscle spasm of back: Secondary | ICD-10-CM

## 2022-04-01 DIAGNOSIS — J4540 Moderate persistent asthma, uncomplicated: Secondary | ICD-10-CM | POA: Diagnosis not present

## 2022-04-01 DIAGNOSIS — R300 Dysuria: Secondary | ICD-10-CM

## 2022-04-01 DIAGNOSIS — N951 Menopausal and female climacteric states: Secondary | ICD-10-CM

## 2022-04-01 DIAGNOSIS — J3089 Other allergic rhinitis: Secondary | ICD-10-CM

## 2022-04-01 DIAGNOSIS — N898 Other specified noninflammatory disorders of vagina: Secondary | ICD-10-CM

## 2022-04-01 DIAGNOSIS — E538 Deficiency of other specified B group vitamins: Secondary | ICD-10-CM

## 2022-04-01 DIAGNOSIS — J302 Other seasonal allergic rhinitis: Secondary | ICD-10-CM

## 2022-04-01 LAB — POCT URINALYSIS DIPSTICK
Bilirubin, UA: NEGATIVE
Blood, UA: NEGATIVE
Glucose, UA: NEGATIVE
Ketones, UA: NEGATIVE
Leukocytes, UA: NEGATIVE
Nitrite, UA: NEGATIVE
Protein, UA: NEGATIVE
Spec Grav, UA: 1.01 (ref 1.010–1.025)
Urobilinogen, UA: 0.2 E.U./dL
pH, UA: 6 (ref 5.0–8.0)

## 2022-04-01 MED ORDER — UBRELVY 100 MG PO TABS: 1.0000 | ORAL_TABLET | ORAL | 5 refills | Status: DC

## 2022-04-01 MED ORDER — FLUCONAZOLE 150 MG PO TABS: 150.0000 mg | ORAL_TABLET | ORAL | 0 refills | Status: DC

## 2022-04-01 MED ORDER — TEMAZEPAM 15 MG PO CAPS: 15.0000 mg | ORAL_CAPSULE | Freq: Every evening | ORAL | 0 refills | Status: DC | PRN

## 2022-04-01 MED ORDER — CYCLOBENZAPRINE HCL 5 MG PO TABS: 5.0000 mg | ORAL_TABLET | Freq: Three times a day (TID) | ORAL | 0 refills | Status: DC | PRN

## 2022-04-01 MED ORDER — MONTELUKAST SODIUM 10 MG PO TABS: 10.0000 mg | ORAL_TABLET | Freq: Every day | ORAL | 1 refills | Status: DC

## 2022-04-01 MED ORDER — VENLAFAXINE HCL ER 37.5 MG PO CP24: 37.5000 mg | ORAL_CAPSULE | Freq: Every day | ORAL | 0 refills | Status: DC

## 2022-04-01 MED ORDER — FLUTICASONE-SALMETEROL 100-50 MCG/ACT IN AEPB: 1.0000 | INHALATION_SPRAY | Freq: Two times a day (BID) | RESPIRATORY_TRACT | 5 refills | Status: DC

## 2022-04-01 MED ORDER — RIZATRIPTAN BENZOATE 10 MG PO TABS: 10.0000 mg | ORAL_TABLET | Freq: Two times a day (BID) | ORAL | 1 refills | Status: DC | PRN

## 2022-04-06 ENCOUNTER — Ambulatory Visit: Payer: 59 | Admitting: Physician Assistant

## 2022-04-06 ENCOUNTER — Encounter: Payer: Self-pay | Admitting: Physician Assistant

## 2022-04-06 VITALS — BP 112/76 | HR 79 | Temp 98.1°F | Resp 16 | Ht 65.0 in | Wt 119.0 lb

## 2022-04-06 DIAGNOSIS — J069 Acute upper respiratory infection, unspecified: Secondary | ICD-10-CM | POA: Diagnosis not present

## 2022-04-06 NOTE — Progress Notes (Signed)
? ? ?  ?    Acute Office Visit ? ? ?Patient: Jessica Carroll   DOB: 01-Jun-1971   51 y.o. Female  MRN: [id number] ?Visit Date: 04/06/2022 ? ?Today's healthcare provider: Dani Gobble Jerel Sardina, PA-C  ?Introduced myself to the patient as a Journalist, newspaper and provided education on APPs in clinical practice.  ? ? ?Chief Complaint  ?Patient presents with  ? Ear Pain  ?  Right, yesterday  ? Sinusitis  ?  X6 days  ? Cough  ?  X4 days  ? ?Subjective  ?  ?HPI ? ?States symptoms began on Fri night with headache and congestion ?States cough started Sun and she developed ear pain last night ?Reports she had sore throat 3 days ago - seems to be resolving ?States she has taken Mucinex, Dayquil, and Sudafed without relief  ? ?Thinks symptoms seem to be getting worse ?She is taking Montelukast and Zyrtec for allergies  ? ? ? ?Medications: ?Outpatient Medications Prior to Visit  ?Medication Sig  ? albuterol (VENTOLIN HFA) 108 (90 Base) MCG/ACT inhaler Inhale 2 puffs into the lungs every 6 (six) hours as needed for wheezing or shortness of breath.  ? carboxymethylcellul-glycerin (REFRESH OPTIVE) 0.5-0.9 % ophthalmic solution Frequency:PHARMDIR   Dosage:0.0     Instructions:  Note:6 x OU Dose: 0.5%-0.9%  ? conjugated estrogens (PREMARIN) vaginal cream Place 1 Applicatorful vaginally daily. For the first 2 weeks after that a few times a week - pea size on urethra and vaginal entry only  ? cyclobenzaprine (FLEXERIL) 5 MG tablet Take 1-2 tablets (5-10 mg total) by mouth 3 (three) times daily as needed for muscle spasms. Take mainly at bedtime as may make drowsy, do not take before driving.  ? fluconazole (DIFLUCAN) 150 MG tablet Take 1 tablet (150 mg total) by mouth every other day.  ? fluticasone-salmeterol (ADVAIR) 100-50 MCG/ACT AEPB Inhale 1 puff into the lungs 2 (two) times daily.  ? ibuprofen (ADVIL) 800 MG tablet Take 1 tablet (800 mg total) by mouth every 8 (eight) hours as needed.  ? montelukast (SINGULAIR) 10 MG tablet Take 1 tablet (10 mg total)  by mouth at bedtime.  ? MULTIPLE VITAMIN PO Take 1 tablet by mouth daily.  ? pantoprazole (PROTONIX) 40 MG tablet Take 1 tablet (40 mg total) by mouth 2 (two) times daily before a meal.  ? Plecanatide 3 MG TABS Take 1 tablet by mouth daily.  ? prednisoLONE acetate (PRED FORTE) 1 % ophthalmic suspension Place 1 drop into the right eye daily.  ? promethazine (PHENERGAN) 12.5 MG tablet Take 1 tablet (12.5 mg total) by mouth every 8 (eight) hours as needed for nausea or vomiting.  ? rizatriptan (MAXALT) 10 MG tablet Take 1 tablet (10 mg total) by mouth 2 (two) times daily as needed.  ? temazepam (RESTORIL) 15 MG capsule Take 1 capsule (15 mg total) by mouth at bedtime as needed for sleep.  ? triamcinolone (KENALOG) 0.1 % Apply 1 application topically 2 (two) times daily as needed.  ? Ubrogepant (UBRELVY) 100 MG TABS Take 1 tablet by mouth every other day.  ? valACYclovir (VALTREX) 500 MG tablet Take 1 tablet by mouth 2 (two) times daily.  ? venlafaxine XR (EFFEXOR XR) 37.5 MG 24 hr capsule Take 1 capsule (37.5 mg total) by mouth daily with breakfast.  ? cetirizine (ZYRTEC) 5 MG tablet Take by mouth.  ? ?No facility-administered medications prior to visit.  ? ? ?Review of Systems  ?Constitutional:  Negative for chills, diaphoresis, fatigue  and fever.  ?HENT:  Positive for congestion, ear pain, rhinorrhea, sinus pain and sore throat. Negative for trouble swallowing.   ?Eyes:  Negative for discharge, redness and itching.  ?Respiratory:  Positive for cough (productive). Negative for shortness of breath and wheezing.   ?Gastrointestinal:  Negative for diarrhea, nausea and vomiting.  ?Musculoskeletal:  Negative for arthralgias and myalgias.  ?Neurological:  Positive for headaches. Negative for dizziness and light-headedness.  ? ? ?  Objective  ?  ?BP 112/76   Pulse 79   Temp 98.1 ?F (36.7 ?C)   Resp 16   Ht '5\' 5"'$  (1.651 m)   Wt 119 lb (54 kg)   LMP 07/11/2017 (Exact Date) Comment: patient still has her ovaries  SpO2  99%   BMI 19.80 kg/m?  ? ? ?Physical Exam ?Vitals reviewed.  ?Constitutional:   ?   General: She is awake.  ?   Appearance: Normal appearance. She is well-developed, well-groomed and normal weight.  ?HENT:  ?   Head: Normocephalic and atraumatic.  ?   Right Ear: Hearing, tympanic membrane, ear canal and external ear normal.  ?   Left Ear: Hearing, tympanic membrane, ear canal and external ear normal.  ?   Mouth/Throat:  ?   Mouth: Mucous membranes are moist.  ?   Pharynx: Uvula midline. Posterior oropharyngeal erythema present. No pharyngeal swelling, oropharyngeal exudate or uvula swelling.  ?   Tonsils: No tonsillar exudate or tonsillar abscesses.  ?Cardiovascular:  ?   Rate and Rhythm: Normal rate and regular rhythm.  ?   Pulses: Normal pulses.  ?   Heart sounds: Normal heart sounds.  ?Pulmonary:  ?   Effort: Pulmonary effort is normal.  ?   Breath sounds: Normal breath sounds and air entry. No decreased breath sounds, wheezing, rhonchi or rales.  ?Musculoskeletal:  ?   Cervical back: Normal range of motion and neck supple.  ?   Right lower leg: No edema.  ?   Left lower leg: No edema.  ?Lymphadenopathy:  ?   Head:  ?   Right side of head: No submental or submandibular adenopathy.  ?   Left side of head: No submental or submandibular adenopathy.  ?Neurological:  ?   Mental Status: She is alert.  ?Psychiatric:     ?   Behavior: Behavior is cooperative.  ?  ? ? ?No results found for any visits on 04/06/22. ? Assessment & Plan  ?  ? ? ?No follow-ups on file.  ?   ?Problem List Items Addressed This Visit   ?None ?Visit Diagnoses   ? ? Upper respiratory tract infection, unspecified type    -  Primary ?Acute, new problem ?Visit with patient indicates symptoms comprised of congestion, cough, ear pain, and headaches since Friday congruent with acute URI that is likely viral in nature  ?Due to nature and duration of symptoms recommended treatment regimen is symptomatic relief and follow up if needed ?Tessalon pearls  offered to assist with cough- patient declined today stating she does not typically benefit from their use ?Discussed with patient the various viral and bacterial etiologies of current illness and appropriate course of treatment ?Discussed OTC medication options for multisymptom relief such as Dayquil/Nyquil, Theraflu, AlkaSeltzer, etc. ?Discussed return precautions if symptoms are not improving or worsen over next 5-7 days.  ?  ? ?  ? ? ? ?No follow-ups on file. ? ? ?I, Emmely Bittinger E Naveya Ellerman, PA-C, have reviewed all documentation for this visit. The documentation on 04/06/22  for the exam, diagnosis, procedures, and orders are all accurate and complete. ? ? ?Orlo Brickle, MHS, PA-C ?[facility] ?[facility]  ? ? ? ? ?

## 2022-05-03 ENCOUNTER — Other Ambulatory Visit: Payer: Self-pay | Admitting: Family Medicine

## 2022-05-03 DIAGNOSIS — G43009 Migraine without aura, not intractable, without status migrainosus: Secondary | ICD-10-CM

## 2022-05-03 MED ORDER — IBUPROFEN 800 MG PO TABS: 800.0000 mg | ORAL_TABLET | Freq: Three times a day (TID) | ORAL | 0 refills | Status: DC | PRN

## 2022-05-10 ENCOUNTER — Other Ambulatory Visit: Payer: Self-pay

## 2022-05-10 DIAGNOSIS — J4540 Moderate persistent asthma, uncomplicated: Secondary | ICD-10-CM

## 2022-05-10 DIAGNOSIS — J302 Other seasonal allergic rhinitis: Secondary | ICD-10-CM

## 2022-05-10 MED ORDER — MONTELUKAST SODIUM 10 MG PO TABS: 10.0000 mg | ORAL_TABLET | Freq: Every day | ORAL | 1 refills | Status: DC

## 2022-05-12 ENCOUNTER — Other Ambulatory Visit: Payer: Self-pay

## 2022-05-12 ENCOUNTER — Encounter: Payer: Self-pay | Admitting: Gastroenterology

## 2022-05-12 MED ORDER — PLECANATIDE 3 MG PO TABS
1.0000 | ORAL_TABLET | Freq: Every day | ORAL | 3 refills | Status: DC
Start: 2022-05-12 — End: 2023-04-03

## 2022-05-16 ENCOUNTER — Encounter: Payer: Self-pay | Admitting: Family Medicine

## 2022-05-18 NOTE — Telephone Encounter (Unsigned)
Copied from [place]. Topic: General - Other >> May 18, 2022 12:17 PM Yvette Rack wrote: Reason for CRM: Pt stated she sent a [facility] message with the phone# [phone] for Dr. Ancil Boozer to call to complete the prior authorization. Pt stated she really needs this to be done so she can get her medication. Cb# [phone]

## 2022-06-02 ENCOUNTER — Other Ambulatory Visit: Payer: Self-pay | Admitting: Family Medicine

## 2022-06-02 DIAGNOSIS — F5104 Psychophysiologic insomnia: Secondary | ICD-10-CM

## 2022-06-03 MED ORDER — TEMAZEPAM 15 MG PO CAPS: 15.0000 mg | ORAL_CAPSULE | Freq: Every evening | ORAL | 1 refills | Status: DC | PRN

## 2022-06-13 ENCOUNTER — Ambulatory Visit: Payer: 59 | Admitting: Nurse Practitioner

## 2022-06-13 ENCOUNTER — Other Ambulatory Visit (HOSPITAL_COMMUNITY)
Admission: RE | Admit: 2022-06-13 | Discharge: 2022-06-13 | Disposition: A | Discharge status: Home / Self Care | Payer: 59 | Source: Ambulatory Visit | Attending: Nurse Practitioner | Admitting: Nurse Practitioner

## 2022-06-13 ENCOUNTER — Encounter: Payer: Self-pay | Admitting: Nurse Practitioner

## 2022-06-13 VITALS — BP 118/70 | HR 82 | Temp 97.8°F | Resp 16 | Ht 65.0 in | Wt 116.7 lb

## 2022-06-13 DIAGNOSIS — N898 Other specified noninflammatory disorders of vagina: Secondary | ICD-10-CM | POA: Insufficient documentation

## 2022-06-13 DIAGNOSIS — B3731 Acute candidiasis of vulva and vagina: Secondary | ICD-10-CM | POA: Insufficient documentation

## 2022-06-13 DIAGNOSIS — R350 Frequency of micturition: Secondary | ICD-10-CM | POA: Diagnosis not present

## 2022-06-13 DIAGNOSIS — R3915 Urgency of urination: Secondary | ICD-10-CM

## 2022-06-13 LAB — POCT URINALYSIS DIPSTICK
Bilirubin, UA: NEGATIVE
Glucose, UA: NEGATIVE
Ketones, UA: NEGATIVE
Nitrite, UA: NEGATIVE
Odor: NORMAL
Protein, UA: NEGATIVE
Spec Grav, UA: 1.01 (ref 1.010–1.025)
Urobilinogen, UA: 0.2 E.U./dL
pH, UA: 5 (ref 5.0–8.0)

## 2022-06-13 MED ORDER — FLUCONAZOLE 150 MG PO TABS: 150.0000 mg | ORAL_TABLET | ORAL | 0 refills | Status: DC | PRN

## 2022-06-13 MED ORDER — NYSTATIN-TRIAMCINOLONE 100000-0.1 UNIT/GM-% EX OINT: 1.0000 | TOPICAL_OINTMENT | Freq: Two times a day (BID) | CUTANEOUS | 0 refills | Status: DC

## 2022-06-13 NOTE — Progress Notes (Signed)
BP 118/70   Pulse 82   Temp 97.8 F (36.6 C) (Oral)   Resp 16   Ht '5\' 5"'$  (1.651 m)   Wt 116 lb 11.2 oz (52.9 kg)   LMP 07/11/2017 (Exact Date) Comment: patient still has her ovaries  SpO2 99%   BMI 19.42 kg/m    Subjective:    Patient ID: Jessica Carroll, female    DOB: 07-18-1971, 51 y.o.   MRN: [id number]  HPI: Jessica Carroll is a 51 y.o. female  Chief Complaint  Patient presents with   Dysuria    More of a burning sensation, sx started since past friday   Urinary Frequency   Vaginal Itching   Urinary frequency and urgency/vaginal discharge and itching: Patient reports symptoms started last Friday.  Patient denies any fever or back pain.  Patient states she does not usually get urinary tract infections normally its bacterial vaginosis or yeast infection.  Patient states that she does have a white vaginal discharge.  Go ahead and treat her for yeast at this time.  Urine dip showed blood, and small leukocytes.  We will send urine for culture and get vaginal swab.  Patient is in agreement with plan.    Relevant past medical, surgical, family and social history reviewed and updated as indicated. Interim medical history since our last visit reviewed. Allergies and medications reviewed and updated.  Review of Systems  Constitutional: Negative for fever or weight change.  Respiratory: Negative for cough and shortness of breath.   Cardiovascular: Negative for chest pain or palpitations.  Gastrointestinal: Negative for abdominal pain, no bowel changes.  GU: Positive vaginal discharge, itching, urinary frequency and urgency. Musculoskeletal: Negative for gait problem or joint swelling.  Skin: Negative for rash.  Neurological: Negative for dizziness or headache.  No other specific complaints in a complete review of systems (except as listed in HPI above).      Objective:    BP 118/70   Pulse 82   Temp 97.8 F (36.6 C) (Oral)   Resp 16   Ht '5\' 5"'$  (1.651 m)   Wt 116 lb  11.2 oz (52.9 kg)   LMP 07/11/2017 (Exact Date) Comment: patient still has her ovaries  SpO2 99%   BMI 19.42 kg/m   Wt Readings from Last 3 Encounters:  06/13/22 116 lb 11.2 oz (52.9 kg)  04/06/22 119 lb (54 kg)  04/01/22 119 lb (54 kg)    Physical Exam  Constitutional: Patient appears well-developed and well-nourished.  No distress.  HEENT: head atraumatic, normocephalic, pupils equal and reactive to light,  neck supple Cardiovascular: Normal rate, regular rhythm and normal heart sounds.  No murmur heard. No BLE edema. Pulmonary/Chest: Effort normal and breath sounds normal. No respiratory distress. Abdominal: Soft.  There is no tenderness.  No CVA tenderness Psychiatric: Patient has a normal mood and affect. behavior is normal. Judgment and thought content normal.  Results for orders placed or performed in visit on 06/13/22  POCT Urinalysis Dipstick  Result Value Ref Range   Color, UA Light yellow    Clarity, UA straw    Glucose, UA Negative Negative   Bilirubin, UA Negative    Ketones, UA Negative    Spec Grav, UA 1.010 1.010 - 1.025   Blood, UA moderate    pH, UA 5.0 5.0 - 8.0   Protein, UA Negative Negative   Urobilinogen, UA 0.2 0.2 or 1.0 E.U./dL   Nitrite, UA Negative    Leukocytes, UA Small (1+) (  A) Negative   Appearance Yellow    Odor Normal       Assessment & Plan:   Problem List Items Addressed This Visit   None Visit Diagnoses     Urinary frequency    -  Primary   Sending urine for culture.  We will treat as indicated.   Relevant Orders   POCT Urinalysis Dipstick (Completed)   Urine Culture   Urinary urgency       We will send urine for culture.  We will treat as indicated.   Relevant Orders   POCT Urinalysis Dipstick (Completed)   Urine Culture   Vaginal itching       Getting vaginal swab, will treat as yeast infection at this time.   Relevant Medications   fluconazole (DIFLUCAN) 150 MG tablet   nystatin-triamcinolone ointment (MYCOLOG)   Other  Relevant Orders   Cervicovaginal ancillary only   Vaginal discharge       Getting vaginal swab, will treat as yeast infection at this time.   Relevant Medications   fluconazole (DIFLUCAN) 150 MG tablet   nystatin-triamcinolone ointment (MYCOLOG)   Other Relevant Orders   Cervicovaginal ancillary only        Follow up plan: Return if symptoms worsen or fail to improve.

## 2022-06-14 LAB — URINE CULTURE
MICRO NUMBER:: 13602410
Result:: NO GROWTH
SPECIMEN QUALITY:: ADEQUATE

## 2022-06-15 ENCOUNTER — Other Ambulatory Visit: Payer: Self-pay | Admitting: Nurse Practitioner

## 2022-06-15 ENCOUNTER — Encounter: Payer: Self-pay | Admitting: Nurse Practitioner

## 2022-06-15 DIAGNOSIS — B379 Candidiasis, unspecified: Secondary | ICD-10-CM

## 2022-06-15 LAB — CERVICOVAGINAL ANCILLARY ONLY
Bacterial Vaginitis (gardnerella): NEGATIVE
Candida Glabrata: NEGATIVE
Candida Vaginitis: POSITIVE — AB
Chlamydia: NEGATIVE
Comment: NEGATIVE
Comment: NEGATIVE
Comment: NEGATIVE
Comment: NEGATIVE
Comment: NEGATIVE
Comment: NORMAL
Neisseria Gonorrhea: NEGATIVE
Trichomonas: NEGATIVE

## 2022-06-15 MED ORDER — FLUCONAZOLE 150 MG PO TABS: 150.0000 mg | ORAL_TABLET | ORAL | 0 refills | Status: DC | PRN

## 2022-06-30 NOTE — Progress Notes (Unsigned)
Name: Jessica Carroll   MRN: [id number]    DOB: 1971/05/26   Date:06/30/2022       Progress Note  Subjective  Chief Complaint  Follow Up  HPI  Depression/anxiety/Insomnia: phq 9 has improved, stress is down since her step son no longer living with them. He was placed in a group home since Summer of 2022  She states mind is always busy at night , wakes up and cannot fall back asleep. Trazodone caused a morning headache. We gave her Temazepam and seems to work but she is walking up with hot flashes, we gave her Effexor last visit but it made her feel wired and she stopped. She asked for something for anxiety, we will try hydroxyzine in place of Restoril for sleep and anxiety ,discussed risk of sedation and not to drive / or operate machinery   RUQ: she has noticed intermittent epigastric pain that goes to RUQ associated with nausea that started about 2 months ago and is happening at least once a week, also gets bloated after meals. No vomiting, but last week she felt like [facility] due to the pain  Menopause: she weaned self off estradiol because she was having daily headache , last visit she said she had mood swings, night sweats and irritability. We gave her Effexor but could not tolerate , she is now taking Anxiety and Stress Relief otc and is feeling better   Chronic constipation and recently diagnosed with IBS by Dr. Vicente Males, she is now on Trulance and it has helped with bloating and pain, symptoms have been controlled. She has bowel movements daily now , she has follow up in Sep    Asthma moderated: she was on Breo but too costly and is back on Advair and singulair and seems to be controlling her symptoms. She denies cough, wheezing or decrease in exercise tolerance Unchanged    GERD:  She is now on Protonix BID , dexilant stopped working. Lately having some nausea and epigastric pain, but mostly RUQ pain and we will check for gallstones    History of iron deficiency and B12 deficiency:  Symptoms improved since she got iron infusion. Last B12 was towards low end of normal - she is only on SL B12 due to cost, last HCT was borderline,she is unable to take iron pills due to constipation    Migraine: She is doing better on Ubrelvy every other day and Maxalt prn once every couple of weeks. Migraine has been better controlled with current regiment, could not tolerate Topamax in the past. Migraine is associated with visual aura, episodes used to be severe associated with photophobia, phenophobia and nausea . Very seldom missing work since started Ubrelvy Fall of 2022. Needs refill of Maxalt   Patient Active Problem List   Diagnosis Date Noted   Age-related nuclear cataract of right eye 01/19/2021   Corneal scar, right eye 01/19/2021   Moderate episode of recurrent major depressive disorder ([place]) 12/20/2018   Iron deficiency anemia 12/19/2018   Type A blood, Rh positive 08/22/2017   S/P vaginal hysterectomy 07/31/2017   Recurrent vaginitis 01/10/2017   Hemorrhoids, external without complications 90/24/0973   Nausea in adult patient 02/24/2016   Endometriosis determined by laparoscopy 01/25/2016   Chronic constipation 08/14/2015   Bilateral ganglion cysts of wrists 08/14/2015   Mild mitral regurgitation 05/19/2015   B12 deficiency 05/18/2015   Asthma, moderate 05/18/2015   Anxiety 05/18/2015   Insomnia, persistent 05/18/2015   Eczema of hand 05/18/2015  Viral keratitis 05/18/2015   Migraine without aura and without status migrainosus, not intractable 05/18/2015   Numerous moles 05/18/2015   Allergic rhinitis 05/18/2015   History of corneal transplant 05/18/2015   Gastroesophageal reflux disease without esophagitis 12/16/2009    Past Surgical History:  Procedure Laterality Date   COLONOSCOPY WITH PROPOFOL N/A 09/29/2017   Procedure: COLONOSCOPY WITH PROPOFOL;  Surgeon: Jonathon Bellows, MD;  Location: [facility] ENDOSCOPY;  Service: Gastroenterology;  Laterality: N/A;   CORNEAL  TRANSPLANT Right 01/07/2013   ESOPHAGOGASTRODUODENOSCOPY (EGD) WITH PROPOFOL N/A 09/29/2017   Procedure: ESOPHAGOGASTRODUODENOSCOPY (EGD) WITH PROPOFOL;  Surgeon: Jonathon Bellows, MD;  Location: [facility] ENDOSCOPY;  Service: Gastroenterology;  Laterality: N/A;   EYE SURGERY     eyelid surgery Right June 19, 2015   [facility] by Dr. Norlene Duel   LAPAROSCOPIC BILATERAL SALPINGECTOMY N/A 01/25/2016   Procedure: LAPAROSCOPIC BILATERAL SALPINGECTOMY, ADHESIOLYSIS, LAPAROSCOPIC EXCISION/FULGERATION ENDOMETRIOSIS ;  Surgeon: [name], MD;  Location: [facility] ORS;  Service: Gynecology;  Laterality: N/A;   Laparoscopic excision and fulguration of endometriosis N/A    bladder flap and uterosacral ligament endometriosis, excised and cauterized   UPPER GI ENDOSCOPY  07/19/2012   VAGINAL HYSTERECTOMY N/A 07/31/2017   Procedure: HYSTERECTOMY VAGINAL;  Surgeon: [name], MD;  Location: [facility] ORS;  Service: Gynecology;  Laterality: N/A;    Family History  Adopted: Yes  Problem Relation Age of Onset   Hyperlipidemia Mother    Hypertension Mother    Diabetes Father    Heart failure Father 90       hear attack   Cancer Neg Hx    Breast cancer Neg Hx     Social History   Tobacco Use   Smoking status: Never   Smokeless tobacco: Never  Substance Use Topics   Alcohol use: Not Currently    Alcohol/week: 0.0 standard drinks of alcohol    Comment: occ. wine     Current Outpatient Medications:    albuterol (VENTOLIN HFA) 108 (90 Base) MCG/ACT inhaler, Inhale 2 puffs into the lungs every 6 (six) hours as needed for wheezing or shortness of breath., Disp: 18 g, Rfl: 0   carboxymethylcellul-glycerin (REFRESH OPTIVE) 0.5-0.9 % ophthalmic solution, Frequency:PHARMDIR   Dosage:0.0     Instructions:  Note:6 x OU Dose: 0.5%-0.9%, Disp: , Rfl:    cetirizine (ZYRTEC) 5 MG tablet, Take by mouth., Disp: , Rfl:    conjugated estrogens (PREMARIN) vaginal cream, Place 1 Applicatorful vaginally daily. For  the first 2 weeks after that a few times a week - pea size on urethra and vaginal entry only, Disp: 42.5 g, Rfl: 12   cyclobenzaprine (FLEXERIL) 5 MG tablet, Take 1-2 tablets (5-10 mg total) by mouth 3 (three) times daily as needed for muscle spasms. Take mainly at bedtime as may make drowsy, do not take before driving., Disp: 90 tablet, Rfl: 0   fluconazole (DIFLUCAN) 150 MG tablet, Take 1 tablet (150 mg total) by mouth every other day., Disp: 3 tablet, Rfl: 0   fluconazole (DIFLUCAN) 150 MG tablet, Take 1 tablet (150 mg total) by mouth every 3 (three) days as needed (for vaginal itching/yeast infection sx)., Disp: 2 tablet, Rfl: 0   fluticasone-salmeterol (ADVAIR) 100-50 MCG/ACT AEPB, Inhale 1 puff into the lungs 2 (two) times daily., Disp: 1 each, Rfl: 5   ibuprofen (ADVIL) 800 MG tablet, Take 1 tablet (800 mg total) by mouth every 8 (eight) hours as needed., Disp: 30 tablet, Rfl: 0   montelukast (SINGULAIR) 10 MG tablet, Take  1 tablet (10 mg total) by mouth at bedtime., Disp: 90 tablet, Rfl: 1   MULTIPLE VITAMIN PO, Take 1 tablet by mouth daily., Disp: , Rfl:    nystatin-triamcinolone ointment (MYCOLOG), Apply 1 Application topically 2 (two) times daily., Disp: 30 g, Rfl: 0   pantoprazole (PROTONIX) 40 MG tablet, Take 1 tablet (40 mg total) by mouth 2 (two) times daily before a meal., Disp: 180 tablet, Rfl: 1   Plecanatide 3 MG TABS, Take 1 tablet by mouth daily., Disp: 90 tablet, Rfl: 3   prednisoLONE acetate (PRED FORTE) 1 % ophthalmic suspension, Place 1 drop into the right eye daily., Disp: , Rfl:    promethazine (PHENERGAN) 12.5 MG tablet, Take 1 tablet (12.5 mg total) by mouth every 8 (eight) hours as needed for nausea or vomiting., Disp: 20 tablet, Rfl: 0   rizatriptan (MAXALT) 10 MG tablet, Take 1 tablet (10 mg total) by mouth 2 (two) times daily as needed., Disp: 10 tablet, Rfl: 1   temazepam (RESTORIL) 15 MG capsule, Take 1 capsule (15 mg total) by mouth at bedtime as needed for sleep.,  Disp: 30 capsule, Rfl: 1   triamcinolone (KENALOG) 0.1 %, Apply 1 application topically 2 (two) times daily as needed., Disp: 80 g, Rfl: 0   Ubrogepant (UBRELVY) 100 MG TABS, Take 1 tablet by mouth every other day., Disp: 16 tablet, Rfl: 5   valACYclovir (VALTREX) 500 MG tablet, Take 1 tablet by mouth 2 (two) times daily., Disp: , Rfl: 0   venlafaxine XR (EFFEXOR XR) 37.5 MG 24 hr capsule, Take 1 capsule (37.5 mg total) by mouth daily with breakfast., Disp: 30 capsule, Rfl: 0  Allergies  Allergen Reactions   Bactrim [Sulfamethoxazole-Trimethoprim] Nausea Only   Penicillins Nausea Only   Topamax [Topiramate] Other (See Comments)    Fatigue     I personally reviewed active problem list, medication list, allergies, family history, social history, health maintenance with the patient/caregiver today.   ROS  Constitutional: Negative for fever or weight change.  Respiratory: Negative for cough and shortness of breath.   Cardiovascular: Negative for chest pain or palpitations.  Gastrointestinal: positive for abdominal pain, but no  bowel changes.  Musculoskeletal: Negative for gait problem or joint swelling.  Skin: Negative for rash.  Neurological: Negative for dizziness or headache.  No other specific complaints in a complete review of systems (except as listed in HPI above).   Objective  There were no vitals filed for this visit.  There is no height or weight on file to calculate BMI.  Physical Exam  Constitutional: Patient appears well-developed and well-nourished.  No distress.  HEENT: head atraumatic, normocephalic, pupils equal and reactive to light, neck supple Cardiovascular: Normal rate, regular rhythm and normal heart sounds.  No murmur heard. No BLE edema. Pulmonary/Chest: Effort normal and breath sounds normal. No respiratory distress. Abdominal: Soft.  There is no tenderness. Psychiatric: Patient has a normal mood and affect. behavior is normal. Judgment and thought  content normal.   Recent Results (from the past 2160 hour(s))  POCT Urinalysis Dipstick     Status: None   Collection Time: 04/01/22  1:55 PM  Result Value Ref Range   Color, UA Yellow    Clarity, UA Clear    Glucose, UA Negative Negative   Bilirubin, UA Negative    Ketones, UA Negative    Spec Grav, UA 1.010 1.010 - 1.025   Blood, UA Negative    pH, UA 6.0 5.0 - 8.0   Protein,  UA Negative Negative   Urobilinogen, UA 0.2 0.2 or 1.0 E.U./dL   Nitrite, UA Negative    Leukocytes, UA Negative Negative   Appearance     Odor    POCT Urinalysis Dipstick     Status: Abnormal   Collection Time: 06/13/22  1:16 PM  Result Value Ref Range   Color, UA Light yellow    Clarity, UA straw    Glucose, UA Negative Negative   Bilirubin, UA Negative    Ketones, UA Negative    Spec Grav, UA 1.010 1.010 - 1.025   Blood, UA moderate    pH, UA 5.0 5.0 - 8.0   Protein, UA Negative Negative   Urobilinogen, UA 0.2 0.2 or 1.0 E.U./dL   Nitrite, UA Negative    Leukocytes, UA Small (1+) (A) Negative   Appearance Yellow    Odor Normal   Cervicovaginal ancillary only     Status: Abnormal   Collection Time: 06/13/22  1:33 PM  Result Value Ref Range   Neisseria Gonorrhea Negative    Chlamydia Negative    Trichomonas Negative    Bacterial Vaginitis (gardnerella) Negative    Candida Vaginitis Positive (A)    Candida Glabrata Negative    Comment Normal Reference Range Candida Species - Negative    Comment Normal Reference Range Candida Galbrata - Negative    Comment Normal Reference Range Trichomonas - Negative    Comment Normal Reference Ranger Chlamydia - Negative    Comment      Normal Reference Range Neisseria Gonorrhea - Negative   Comment      Normal Reference Range Bacterial Vaginosis - Negative  Urine Culture     Status: None   Collection Time: 06/13/22  2:26 PM   Specimen: Urine  Result Value Ref Range   MICRO NUMBER: [id number]    SPECIMEN QUALITY: Adequate    Sample Source URINE     STATUS: FINAL    Result: No Growth     PHQ2/9:    06/13/2022    1:07 PM 04/01/2022    1:48 PM 02/22/2022    1:48 PM 12/31/2021    7:49 AM 10/08/2021   11:01 AM  Depression screen PHQ 2/9  Decreased Interest 1 1 0 1 0  Down, Depressed, Hopeless 0 1 0 1 0  PHQ - 2 Score 1 2 0 2 0  Altered sleeping 0 2  1 0  Tired, decreased energy 0 0  1 0  Change in appetite 0 0  0 0  Feeling bad or failure about yourself  0 0  0 0  Trouble concentrating 0 0  0 0  Moving slowly or fidgety/restless 0 0  0 0  Suicidal thoughts 0 0  0 0  PHQ-9 Score '1 4  4 '$ 0  Difficult doing work/chores Not difficult at all    Not difficult at all    phq 9 is positive     07/01/2022    1:37 PM 06/13/2022    1:07 PM 03/26/2021   10:14 AM 08/07/2020    3:30 PM  GAD 7 : Generalized Anxiety Score  Nervous, Anxious, on Edge 3 0 2 2  Control/stop worrying 1 0 3 2  Worry too much - different things 2 0 2 2  Trouble relaxing 3 0 3 3  Restless 0 0 0 1  Easily annoyed or irritable 3 0 2 2  Afraid - awful might happen 0 0 0 0  Total GAD 7 Score 12 0  12 12  Anxiety Difficulty Somewhat difficult Not difficult at all Not difficult at all      Fall Risk:    06/13/2022    1:07 PM 04/06/2022    8:58 AM 04/01/2022    1:48 PM 02/22/2022    1:48 PM 12/31/2021    7:49 AM  Fall Risk   Falls in the past year? 0 0 0 0 0  Number falls in past yr: 0 0 0 0 0  Injury with Fall? 0 0 0 0 0  Risk for fall due to : No Fall Risks No Fall Risks No Fall Risks  No Fall Risks  Follow up Falls prevention discussed;Education provided Falls prevention discussed Falls prevention discussed Falls evaluation completed Falls prevention discussed       Assessment & Plan  1. Moderate persistent asthma without complication  Stable   2. Gastroesophageal reflux disease without esophagitis   3. Psychophysiological insomnia  - hydrOXYzine (ATARAX) 10 MG tablet; Take 1-2 tablets (10-20 mg total) by mouth 3 (three) times daily as needed.   Dispense: 90 tablet; Refill: 0  4. Migraine with aura and without status migrainosus, not intractable  - rizatriptan (MAXALT) 10 MG tablet; Take 1 tablet (10 mg total) by mouth 2 (two) times daily as needed.  Dispense: 10 tablet; Refill: 1  5. RUQ abdominal pain  - US ABDOMEN LIMITED RUQ (LIVER/GB); Future - COMPLETE METABOLIC PANEL WITH GFR  6. Nausea  - US ABDOMEN LIMITED RUQ (LIVER/GB); Future - COMPLETE METABOLIC PANEL WITH GFR  7. Anxiety  - hydrOXYzine (ATARAX) 10 MG tablet; Take 1-2 tablets (10-20 mg total) by mouth 3 (three) times daily as needed.  Dispense: 90 tablet; Refill: 0  8. B12 deficiency  - B12 and Folate Panel  9. History of iron deficiency anemia  - Iron, TIBC and Ferritin Panel - CBC with Differential/Platelet    10. Dyspepsia  - H. pylori breath test

## 2022-07-01 ENCOUNTER — Ambulatory Visit: Payer: 59 | Admitting: Family Medicine

## 2022-07-01 ENCOUNTER — Encounter: Payer: Self-pay | Admitting: Family Medicine

## 2022-07-01 VITALS — BP 100/72 | HR 71 | Temp 97.9°F | Resp 14 | Ht 65.0 in | Wt 118.8 lb

## 2022-07-01 DIAGNOSIS — R110 Nausea: Secondary | ICD-10-CM

## 2022-07-01 DIAGNOSIS — G43109 Migraine with aura, not intractable, without status migrainosus: Secondary | ICD-10-CM

## 2022-07-01 DIAGNOSIS — Z862 Personal history of diseases of the blood and blood-forming organs and certain disorders involving the immune mechanism: Secondary | ICD-10-CM

## 2022-07-01 DIAGNOSIS — F5104 Psychophysiologic insomnia: Secondary | ICD-10-CM | POA: Diagnosis not present

## 2022-07-01 DIAGNOSIS — F419 Anxiety disorder, unspecified: Secondary | ICD-10-CM

## 2022-07-01 DIAGNOSIS — E538 Deficiency of other specified B group vitamins: Secondary | ICD-10-CM

## 2022-07-01 DIAGNOSIS — K219 Gastro-esophageal reflux disease without esophagitis: Secondary | ICD-10-CM | POA: Diagnosis not present

## 2022-07-01 DIAGNOSIS — J4540 Moderate persistent asthma, uncomplicated: Secondary | ICD-10-CM

## 2022-07-01 DIAGNOSIS — R1013 Epigastric pain: Secondary | ICD-10-CM

## 2022-07-01 DIAGNOSIS — R1011 Right upper quadrant pain: Secondary | ICD-10-CM

## 2022-07-01 MED ORDER — RIZATRIPTAN BENZOATE 10 MG PO TABS: 10.0000 mg | ORAL_TABLET | Freq: Two times a day (BID) | ORAL | 1 refills | Status: DC | PRN

## 2022-07-01 MED ORDER — HYDROXYZINE HCL 10 MG PO TABS: 10.0000 mg | ORAL_TABLET | Freq: Three times a day (TID) | ORAL | 0 refills | Status: DC | PRN

## 2022-07-05 ENCOUNTER — Encounter: Payer: Self-pay | Admitting: Family Medicine

## 2022-07-05 LAB — CBC WITH DIFFERENTIAL/PLATELET
Absolute Monocytes: 378 cells/uL (ref 200–950)
Basophils Absolute: 67 cells/uL (ref 0–200)
Basophils Relative: 1.1 %
Eosinophils Absolute: 128 cells/uL (ref 15–500)
Eosinophils Relative: 2.1 %
HCT: 32.5 % — ABNORMAL LOW (ref 35.0–45.0)
Hemoglobin: 10.8 g/dL — ABNORMAL LOW (ref 11.7–15.5)
Lymphs Abs: 2208 cells/uL (ref 850–3900)
MCH: 30.3 pg (ref 27.0–33.0)
MCHC: 33.2 g/dL (ref 32.0–36.0)
MCV: 91.3 fL (ref 80.0–100.0)
MPV: 12.7 fL — ABNORMAL HIGH (ref 7.5–12.5)
Monocytes Relative: 6.2 %
Neutro Abs: 3318 cells/uL (ref 1500–7800)
Neutrophils Relative %: 54.4 %
Platelets: 215 10*3/uL (ref 140–400)
RBC: 3.56 10*6/uL — ABNORMAL LOW (ref 3.80–5.10)
RDW: 12 % (ref 11.0–15.0)
Total Lymphocyte: 36.2 %
WBC: 6.1 10*3/uL (ref 3.8–10.8)

## 2022-07-05 LAB — B12 AND FOLATE PANEL
Folate: 14.4 ng/mL
Vitamin B-12: 271 pg/mL (ref 200–1100)

## 2022-07-05 LAB — IRON,TIBC AND FERRITIN PANEL
%SAT: 13 % (calc) — ABNORMAL LOW (ref 16–45)
Ferritin: 175 ng/mL (ref 16–232)
Iron: 35 ug/dL — ABNORMAL LOW (ref 45–160)
TIBC: 268 mcg/dL (calc) (ref 250–450)

## 2022-07-05 LAB — COMPLETE METABOLIC PANEL WITH GFR
AG Ratio: 1.8 (calc) (ref 1.0–2.5)
ALT: 8 U/L (ref 6–29)
AST: 13 U/L (ref 10–35)
Albumin: 4.4 g/dL (ref 3.6–5.1)
Alkaline phosphatase (APISO): 55 U/L (ref 37–153)
BUN: 11 mg/dL (ref 7–25)
CO2: 30 mmol/L (ref 20–32)
Calcium: 9.4 mg/dL (ref 8.6–10.4)
Chloride: 104 mmol/L (ref 98–110)
Creat: 0.75 mg/dL (ref 0.50–1.03)
Globulin: 2.5 g/dL (calc) (ref 1.9–3.7)
Glucose, Bld: 92 mg/dL (ref 65–99)
Potassium: 4.3 mmol/L (ref 3.5–5.3)
Sodium: 139 mmol/L (ref 135–146)
Total Bilirubin: 0.8 mg/dL (ref 0.2–1.2)
Total Protein: 6.9 g/dL (ref 6.1–8.1)
eGFR: 96 mL/min/{1.73_m2} (ref >=60)

## 2022-07-05 LAB — H. PYLORI BREATH TEST: H. pylori Breath Test: NOT DETECTED

## 2022-07-07 ENCOUNTER — Telehealth: Payer: Self-pay | Admitting: Family Medicine

## 2022-07-07 ENCOUNTER — Encounter: Payer: Self-pay | Admitting: Family Medicine

## 2022-07-07 NOTE — Telephone Encounter (Signed)
Copied from [place] [phone]. Topic: General - Inquiry >> Jul 06, 2022  4:32 PM Erskine Squibb wrote: Reason for CRM: The patient called in requesting results on her lab work that was done on Friday 07/21. She states she saw the results were in on Monday on her Mychart and would just like a call to go over results. Please assist patient further.

## 2022-07-08 ENCOUNTER — Ambulatory Visit
Admission: RE | Admit: 2022-07-08 | Discharge: 2022-07-08 | Disposition: A | Discharge status: Home / Self Care | Payer: 59 | Source: Ambulatory Visit | Attending: Family Medicine | Admitting: Family Medicine

## 2022-07-08 DIAGNOSIS — R1011 Right upper quadrant pain: Secondary | ICD-10-CM | POA: Diagnosis present

## 2022-07-08 DIAGNOSIS — R110 Nausea: Secondary | ICD-10-CM | POA: Diagnosis present

## 2022-07-11 ENCOUNTER — Encounter: Payer: Self-pay | Admitting: Gastroenterology

## 2022-07-11 ENCOUNTER — Ambulatory Visit (INDEPENDENT_AMBULATORY_CARE_PROVIDER_SITE_OTHER): Payer: 59

## 2022-07-11 ENCOUNTER — Telehealth: Payer: Self-pay | Admitting: Family Medicine

## 2022-07-11 DIAGNOSIS — E538 Deficiency of other specified B group vitamins: Secondary | ICD-10-CM

## 2022-07-11 MED ORDER — CYANOCOBALAMIN 1000 MCG/ML IJ SOLN
1000.0000 ug | Freq: Once | INTRAMUSCULAR | Status: AC
  Administered 2022-07-11: 1000 ug via INTRAMUSCULAR

## 2022-07-11 NOTE — Telephone Encounter (Signed)
Already spoke to patient.

## 2022-07-11 NOTE — Telephone Encounter (Signed)
I let Jessica Carroll know she will need to do a nurse visit not an office visit since Dr. Ancil Boozer said she can have monthly B12 shots.

## 2022-07-11 NOTE — Telephone Encounter (Signed)
Copied from [place]. Topic: General - Inquiry >> Jul 11, 2022 10:48 AM Jessica Carroll wrote: Reason for CRM: Pt called saying she use get B12 shots.  She stopped because the insurance stopped paying saying it was filed as a a doctors visit and should be nurse.  She wants to start taking them again.  She wants to know what she needs to do and also get it file correctly per insurance  CB@  [phone]

## 2022-07-12 ENCOUNTER — Other Ambulatory Visit: Payer: Self-pay

## 2022-07-12 MED ORDER — DEXLANSOPRAZOLE 60 MG PO CPDR: 60.0000 mg | DELAYED_RELEASE_CAPSULE | Freq: Every day | ORAL | 3 refills | Status: DC

## 2022-08-12 ENCOUNTER — Ambulatory Visit (INDEPENDENT_AMBULATORY_CARE_PROVIDER_SITE_OTHER): Payer: 59

## 2022-08-12 DIAGNOSIS — E538 Deficiency of other specified B group vitamins: Secondary | ICD-10-CM | POA: Diagnosis not present

## 2022-08-12 MED ORDER — CYANOCOBALAMIN 1000 MCG/ML IJ SOLN
1000.0000 ug | Freq: Once | INTRAMUSCULAR | Status: AC
  Administered 2022-08-12: 1000 ug via INTRAMUSCULAR

## 2022-08-25 ENCOUNTER — Ambulatory Visit: Payer: 59 | Admitting: Gastroenterology

## 2022-08-25 ENCOUNTER — Encounter: Payer: Self-pay | Admitting: Gastroenterology

## 2022-08-25 ENCOUNTER — Other Ambulatory Visit: Payer: Self-pay

## 2022-08-25 ENCOUNTER — Ambulatory Visit: Payer: 59

## 2022-08-25 VITALS — BP 120/70 | HR 74 | Temp 98.0°F | Wt 121.4 lb

## 2022-08-25 DIAGNOSIS — R140 Abdominal distension (gaseous): Secondary | ICD-10-CM | POA: Diagnosis not present

## 2022-08-25 DIAGNOSIS — R1013 Epigastric pain: Secondary | ICD-10-CM

## 2022-08-25 DIAGNOSIS — R1310 Dysphagia, unspecified: Secondary | ICD-10-CM

## 2022-08-25 MED ORDER — DEXLANSOPRAZOLE 60 MG PO CPDR: 60.0000 mg | DELAYED_RELEASE_CAPSULE | Freq: Every day | ORAL | 3 refills | Status: DC

## 2022-08-25 NOTE — Progress Notes (Unsigned)
Jonathon Bellows MD, MRCP(U.K) [address]  [place]  [place], [place] [address]  Main: [phone]  Fax: [phone]   Primary Care Physician: Steele Sizer, MD  Primary Gastroenterologist:  Dr. Jonathon Bellows   Chief Complaint  Patient presents with   Abdominal Pain    HPI: Jessica Carroll is a 51 y.o. female Summary of history :   She has previously been seen at my office back in November 2021.  History of constipation.  History of bloating abdominal discomfort felt due to be a combination of GERD constipation and secondary bloating.  History of consumption of Stevia 2-3 patches a day advised to stop previously insurance did not cover Linzess was taking MiraLAX.   09/29/2017: EGD: Normal appearance.  Colonoscopy was normal except for internal hemorrhoids.  Biopsies of stomach showed features of mild chronic gastritis negative H. pylori.   09/01/2017 right upper quadrant ultrasound showed benign-appearing cyst in the left hepatic lobe   12/24/2019 hemoglobin 12.2 g, hepatitis C antibody negative, CMP normal except a mildly elevated total bilirubin.   Interval history  10/12/2020-08/25/2022   Here to see me for abdominal discomfort.  07/08/2022 ultrasound right upper quadrant shows steatosis. 07/01/2022 H. pylori breath test negative, hemoglobin 10.8 g with an MCV of 91 CMP normal.  B12 low borderline 271 on injections When she made the phone call she was having abdominal pains.  We put her on Dexilant once a day and her abdominal pain has resolved continues to have bloating.  She is not taking any artificial sugars or sweeteners having diarrhea at times causing her hemorrhoids to itch.  She has been having some regurgitation at times feels like fluids go down slowly.  She has been taking Trulance on a daily basis.  No other complaints.   Current Outpatient Medications  Medication Sig Dispense Refill   albuterol (VENTOLIN HFA) 108 (90 Base) MCG/ACT inhaler Inhale 2 puffs  into the lungs every 6 (six) hours as needed for wheezing or shortness of breath. 18 g 0   carboxymethylcellul-glycerin (REFRESH OPTIVE) 0.5-0.9 % ophthalmic solution Frequency:PHARMDIR   Dosage:0.0     Instructions:  Note:6 x OU Dose: 0.5%-0.9%     cetirizine (ZYRTEC) 5 MG tablet Take by mouth.     conjugated estrogens (PREMARIN) vaginal cream Place 1 Applicatorful vaginally daily. For the first 2 weeks after that a few times a week - pea size on urethra and vaginal entry only 42.5 g 12   cyclobenzaprine (FLEXERIL) 5 MG tablet Take 1-2 tablets (5-10 mg total) by mouth 3 (three) times daily as needed for muscle spasms. Take mainly at bedtime as may make drowsy, do not take before driving. 90 tablet 0   dexlansoprazole (DEXILANT) 60 MG capsule Take 1 capsule (60 mg total) by mouth daily. 90 capsule 3   fluticasone-salmeterol (ADVAIR) 100-50 MCG/ACT AEPB Inhale 1 puff into the lungs 2 (two) times daily. 1 each 5   hydrOXYzine (ATARAX) 10 MG tablet Take 1-2 tablets (10-20 mg total) by mouth 3 (three) times daily as needed. 90 tablet 0   ibuprofen (ADVIL) 800 MG tablet Take 1 tablet (800 mg total) by mouth every 8 (eight) hours as needed. 30 tablet 0   montelukast (SINGULAIR) 10 MG tablet Take 1 tablet (10 mg total) by mouth at bedtime. 90 tablet 1   MULTIPLE VITAMIN PO Take 1 tablet by mouth daily.     Plecanatide 3 MG TABS Take 1 tablet by mouth daily. 90 tablet 3  prednisoLONE acetate (PRED FORTE) 1 % ophthalmic suspension Place 1 drop into the right eye daily.     promethazine (PHENERGAN) 12.5 MG tablet Take 1 tablet (12.5 mg total) by mouth every 8 (eight) hours as needed for nausea or vomiting. 20 tablet 0   rizatriptan (MAXALT) 10 MG tablet Take 1 tablet (10 mg total) by mouth 2 (two) times daily as needed. 10 tablet 1   triamcinolone (KENALOG) 0.1 % Apply 1 application topically 2 (two) times daily as needed. 80 g 0   Ubrogepant (UBRELVY) 100 MG TABS Take 1 tablet by mouth every other day. 16  tablet 5   valACYclovir (VALTREX) 500 MG tablet Take 1 tablet by mouth 2 (two) times daily.  0   No current facility-administered medications for this visit.    Allergies as of 08/25/2022 - Review Complete 07/01/2022  Allergen Reaction Noted   Bactrim [sulfamethoxazole-trimethoprim] Nausea Only 09/25/2018   Penicillins Nausea Only 03/31/2015   Topamax [topiramate] Other (See Comments) 10/04/2021    ROS:  General: Negative for anorexia, weight loss, fever, chills, fatigue, weakness. ENT: Negative for hoarseness, difficulty swallowing , nasal congestion. CV: Negative for chest pain, angina, palpitations, dyspnea on exertion, peripheral edema.  Respiratory: Negative for dyspnea at rest, dyspnea on exertion, cough, sputum, wheezing.  GI: See history of present illness. GU:  Negative for dysuria, hematuria, urinary incontinence, urinary frequency, nocturnal urination.  Endo: Negative for unusual weight change.    Physical Examination:   LMP 07/11/2017 (Exact Date) Comment: patient still has her ovaries  General: Well-nourished, well-developed in no acute distress.  Eyes: No icterus. Conjunctivae pink. Skin: Warm and dry, no jaundice.   Psych: Alert and cooperative, normal mood and affect.   Imaging Studies: No results found.  Assessment and Plan:   Jessica Carroll is a 51 y.o. y/o female to follow-up for gas and bloating.  She called our office a few weeks back was having abdominal discomfort we advised her to stop NSAIDs she has been on Dexilant since then and has done well.  She has longstanding constipation which has responded well to Trulance occasionally causes diarrhea which causes her hemorrhoids to itch.   Plan 1.  Low FODMAP diet 2.  Can take Trulance every other day if required 3.  EGD to evaluate for dysphagia/regurgitation 4.  As needed use of charcoal tablets for bloating. 5.  Prescription for Dexilant to be provided for 90 days with 3 refills.  I have  discussed alternative options, risks & benefits,  which include, but are not limited to, bleeding, infection, perforation,respiratory complication & drug reaction.  The patient agrees with this plan & written consent will be obtained.     Dr Jonathon Bellows  MD,MRCP [facility]) Follow up in as needed

## 2022-08-25 NOTE — Patient Instructions (Addendum)
Low-FODMAP Eating Plan  FODMAP stands for fermentable oligosaccharides, disaccharides, monosaccharides, and polyols. These are sugars that are hard for some people to digest. A low-FODMAP eating plan may help some people who have irritable bowel syndrome (IBS) and certain other bowel (intestinal) diseases to manage their symptoms. This meal plan can be complicated to follow. Work with a diet and nutrition specialist (dietitian) to make a low-FODMAP eating plan that is right for you. A dietitian can help make sure that you get enough nutrition from this diet. What are tips for following this plan? Reading food labels Check labels for hidden FODMAPs such as: High-fructose syrup. Honey. Agave. Natural fruit flavors. Onion or garlic powder. Choose low-FODMAP foods that contain 3-4 grams of fiber per serving. Check food labels for serving sizes. Eat only one serving at a time to make sure FODMAP levels stay low. Shopping Shop with a list of foods that are recommended on this diet and make a meal plan. Meal planning Follow a low-FODMAP eating plan for up to 6 weeks, or as told by your health care provider or dietitian. To follow the eating plan: Eliminate high-FODMAP foods from your diet completely. Choose only low-FODMAP foods to eat. You will do this for 2-6 weeks. Gradually reintroduce high-FODMAP foods into your diet one at a time. Most people should wait a few days before introducing the next new high-FODMAP food into their meal plan. Your dietitian can recommend how quickly you may reintroduce foods. Keep a daily record of what and how much you eat and drink. Make note of any symptoms that you have after eating. Review your daily record with a dietitian regularly to identify which foods you can eat and which foods you should avoid. General tips Drink enough fluid each day to keep your urine pale yellow. Avoid processed foods. These often have added sugar and may be high in  FODMAPs. Avoid most dairy products, whole grains, and sweeteners. Work with a dietitian to make sure you get enough fiber in your diet. Avoid high FODMAP foods at meals to manage symptoms. Recommended foods Fruits Bananas, oranges, tangerines, lemons, limes, blueberries, raspberries, strawberries, grapes, cantaloupe, honeydew melon, kiwi, papaya, passion fruit, and pineapple. Limited amounts of dried cranberries, banana chips, and shredded coconut. Vegetables Eggplant, zucchini, cucumber, peppers, green beans, bean sprouts, lettuce, arugula, kale, Swiss chard, spinach, collard greens, bok choy, summer squash, potato, and tomato. Limited amounts of corn, carrot, and sweet potato. Green parts of scallions. Grains Gluten-free grains, such as rice, oats, buckwheat, quinoa, corn, polenta, and millet. Gluten-free pasta, bread, or cereal. Rice noodles. Corn tortillas. Meats and other proteins Unseasoned beef, pork, poultry, or fish. Eggs. Berniece Salines. Tofu (firm) and tempeh. Limited amounts of nuts and seeds, such as almonds, walnuts, [place] nuts, pecans, peanuts, nut butters, pumpkin seeds, chia seeds, and sunflower seeds. Dairy Lactose-free milk, yogurt, and kefir. Lactose-free cottage cheese and ice cream. Non-dairy milks, such as almond, coconut, hemp, and rice milk. Non-dairy yogurt. Limited amounts of goat cheese, brie, mozzarella, parmesan, swiss, and other hard cheeses. Fats and oils Butter-free spreads. Vegetable oils, such as olive, canola, and sunflower oil. Seasoning and other foods Artificial sweeteners with names that do not end in "ol," such as aspartame, saccharine, and stevia. Maple syrup, white table sugar, raw sugar, brown sugar, and molasses. Mayonnaise, soy sauce, and tamari. Fresh basil, coriander, parsley, rosemary, and thyme. Beverages Water and mineral water. Sugar-sweetened soft drinks. Small amounts of orange juice or cranberry juice. Black and green tea. Most dry  wines.  Coffee. The items listed above may not be a complete list of foods and beverages you can eat. Contact a dietitian for more information. Foods to avoid Fruits Fresh, dried, and juiced forms of apple, pear, watermelon, peach, plum, cherries, apricots, blackberries, boysenberries, figs, nectarines, and mango. Avocado. Vegetables Chicory root, artichoke, asparagus, cabbage, snow peas, Brussels sprouts, broccoli, sugar snap peas, mushrooms, celery, and cauliflower. Onions, garlic, leeks, and the white part of scallions. Grains Wheat, including kamut, durum, and semolina. Barley and bulgur. Couscous. Wheat-based cereals. Wheat noodles, bread, crackers, and pastries. Meats and other proteins Fried or fatty meat. Sausage. Cashews and pistachios. Soybeans, baked beans, black beans, chickpeas, kidney beans, fava beans, navy beans, lentils, black-eyed peas, and split peas. Dairy Milk, yogurt, ice cream, and soft cheese. Cream and sour cream. Milk-based sauces. Custard. Buttermilk. Soy milk. Seasoning and other foods Any sugar-free gum or candy. Foods that contain artificial sweeteners such as sorbitol, mannitol, isomalt, or xylitol. Foods that contain honey, high-fructose corn syrup, or agave. Bouillon, vegetable stock, beef stock, and chicken stock. Garlic and onion powder. Condiments made with onion, such as hummus, chutney, pickles, relish, salad dressing, and salsa. Tomato paste. Beverages Chicory-based drinks. Coffee substitutes. Chamomile tea. Fennel tea. Sweet or fortified wines such as port or sherry. Diet soft drinks made with isomalt, mannitol, maltitol, sorbitol, or xylitol. Apple, pear, and mango juice. Juices with high-fructose corn syrup. The items listed above may not be a complete list of foods and beverages you should avoid. Contact a dietitian for more information. Summary FODMAP stands for fermentable oligosaccharides, disaccharides, monosaccharides, and polyols. These are sugars that are  hard for some people to digest. A low-FODMAP eating plan is a short-term diet that helps to ease symptoms of certain bowel diseases. The eating plan usually lasts up to 6 weeks. After that, high-FODMAP foods are reintroduced gradually and one at a time. This can help you find out which foods may be causing symptoms. A low-FODMAP eating plan can be complicated. It is best to work with a dietitian who has experience with this type of plan. This information is not intended to replace advice given to you by your health care provider. Make sure you discuss any questions you have with your health care provider. Document Revised: 04/16/2020 Document Reviewed: 04/16/2020 Elsevier Patient Education  [place].    How to Take a [employer] A sitz bath is a warm water bath that may be used to care for your rectum, genital area, or the area between your rectum and genitals (perineum). In a sitz bath, the water only comes up to your hips and covers your buttocks. A sitz bath may be done in a bathtub or with a portable sitz bath that fits over the toilet. Your health care provider may recommend a sitz bath to help: Relieve pain and discomfort after delivering a baby. Relieve pain and itching from hemorrhoids or anal fissures. Relieve pain after certain surgeries. Relax muscles that are sore or tight. How to take a sitz bath Take 2-4 sitz baths a day, or as many as told by your health care provider. Bathtub sitz bath To take a sitz bath in a bathtub: Partially fill a bathtub with warm water. The water should be deep enough to cover your hips and buttocks when you are sitting in the bathtub. Follow your health care provider's instructions if you are told to put medicine in the water. Sit in the water. Open the bathtub drain a little, and  leave it open during your bath. Turn on the warm water again, enough to replace the water that is draining out. Keep the water running throughout your bath. This  helps keep the water at the right level and temperature. Soak in the water for 15-20 minutes, or as long as told by your health care provider. When you are done, be careful when you stand up. You may feel dizzy. After the sitz bath, pat yourself dry. Do not rub your skin to dry it.  Over-the-toilet sitz bath To take a sitz bath with an over-the-toilet basin: Follow the manufacturer's instructions. Fill the basin with warm water. Follow your health care provider's instructions if you were told to put medicine in the water. Sit on the seat. Make sure the water covers your buttocks and perineum. Soak in the water for 15-20 minutes, or as long as told by your health care provider. After the sitz bath, pat yourself dry. Do not rub your skin to dry it. Clean and dry the basin between uses. Discard the basin if it cracks, or according to the manufacturer's instructions.  Contact a health care provider if: Your pain or itching gets worse. Stop doing sitz baths if your symptoms get worse. You have new symptoms. Stop doing sitz baths until you talk with your health care provider. Summary A sitz bath is a warm water bath in which the water only comes up to your hips and covers your buttocks. Your health care provider may recommend a sitz bath to help relieve pain and discomfort after delivering a baby, relieve pain and itching from hemorrhoids or anal fissures, relieve pain after certain surgeries, or help to relax muscles that are sore or tight. Take 2-4 sitz baths a day, or as many as told by your health care provider. Soak in the water for 15-20 minutes. Stop doing sitz baths if your symptoms get worse. This information is not intended to replace advice given to you by your health care provider. Make sure you discuss any questions you have with your health care provider. Document Revised: 03/01/2022 Document Reviewed: 03/01/2022 Elsevier Patient Education  [place].

## 2022-09-13 ENCOUNTER — Telehealth: Payer: Self-pay | Admitting: *Deleted

## 2022-09-13 NOTE — Telephone Encounter (Signed)
Patient called office requesting EGD to be reschedule from 09/15/2022 and reschedule it to 12/02/2022.  Called [facility] Endo unit to make the change.

## 2022-09-14 ENCOUNTER — Ambulatory Visit (INDEPENDENT_AMBULATORY_CARE_PROVIDER_SITE_OTHER): Payer: 59

## 2022-09-14 DIAGNOSIS — E538 Deficiency of other specified B group vitamins: Secondary | ICD-10-CM

## 2022-09-14 MED ORDER — CYANOCOBALAMIN 1000 MCG/ML IJ SOLN
1000.0000 ug | Freq: Once | INTRAMUSCULAR | Status: AC
  Administered 2022-09-14: 1000 ug via INTRAMUSCULAR

## 2022-09-28 NOTE — Progress Notes (Signed)
Name: Jessica Carroll   MRN: [id number]    DOB: 08-Jun-1971   Date:09/30/2022       Progress Note  Subjective  Chief Complaint  Follow Up  HPI  Depression/anxiety/Insomnia: phq 9 has improved, stress is down since her step son no longer living with them. He was placed in a group home since Summer of 2022  She states mind is always busy at night , wakes up and cannot fall back asleep. Trazodone caused a morning headache. We gave her Temazepam and seems to work but she is walking up with hot flashes, we gave her Effexor last visit but it made her feel wired and she stopped. Currently on hydroxyzine at night and is doing well on current regiment.   Menopause: she weaned self off estradiol because she was having daily headache , last visit she said she had mood swings, night sweats and irritability. We gave her Effexor but could not tolerate , she is now taking Anxiety and Stress Relief otc and is feeling better She is using premarin topically prn   Chronic constipation and recently diagnosed with IBS by Dr. Vicente Males, she is now on Trulance and it has helped with bloating and pain, she has bowel movements daily now    Asthma moderated: she was on Breo but too costly and is back on Advair and singulair and seems to be controlling her symptoms. She denies cough, wheezing or decrease in exercise tolerance She had flu shot at work   GERD:  She was having worsening of symptoms and some intermittent RUQ pain, plus anemia, she went to see Dr. Vicente Males, taking Dexilant now and symptoms have improved but she will have EGD done in Dec 2023    History of iron deficiency and B12 deficiency: Symptoms improved since she got iron infusion. Last B12 was towards low end of normal - she is back on B12 injections and doing well    Migraine: She was initially taking Ubrelvy every other day however since frequency of migraine decreased to about once a month she has been taking either Ubrelvy or Maxalt  She could not tolerate  Topamax in the past. Migraine is associated with visual aura, episodes used to be severe associated with photophobia, phenophobia and nausea . Very seldom missing work because of migraines now   Intermittent back pain: takes flexeril prn and it works well for her  Vaginitis: she noticed some vaginal itching and now also having some dysuria, mild discharge that is white , no odor. She has a history of yeast vaginitis   Patient Active Problem List   Diagnosis Date Noted   Age-related nuclear cataract of right eye 01/19/2021   Corneal scar, right eye 01/19/2021   Moderate episode of recurrent major depressive disorder ([place]) 12/20/2018   Iron deficiency anemia 12/19/2018   Type A blood, Rh positive 08/22/2017   S/P vaginal hysterectomy 07/31/2017   Recurrent vaginitis 01/10/2017   Hemorrhoids, external without complications 35/32/9924   Nausea in adult patient 02/24/2016   Endometriosis determined by laparoscopy 01/25/2016   Chronic constipation 08/14/2015   Bilateral ganglion cysts of wrists 08/14/2015   Mild mitral regurgitation 05/19/2015   B12 deficiency 05/18/2015   Asthma, moderate 05/18/2015   Anxiety 05/18/2015   Insomnia, persistent 05/18/2015   Eczema of hand 05/18/2015   Viral keratitis 05/18/2015   Migraine without aura and without status migrainosus, not intractable 05/18/2015   Numerous moles 05/18/2015   Allergic rhinitis 05/18/2015   History of corneal transplant 05/18/2015  Gastroesophageal reflux disease without esophagitis 12/16/2009    Past Surgical History:  Procedure Laterality Date   COLONOSCOPY WITH PROPOFOL N/A 09/29/2017   Procedure: COLONOSCOPY WITH PROPOFOL;  Surgeon: Jonathon Bellows, MD;  Location: [facility] ENDOSCOPY;  Service: Gastroenterology;  Laterality: N/A;   CORNEAL TRANSPLANT Right 01/07/2013   ESOPHAGOGASTRODUODENOSCOPY (EGD) WITH PROPOFOL N/A 09/29/2017   Procedure: ESOPHAGOGASTRODUODENOSCOPY (EGD) WITH PROPOFOL;  Surgeon: Jonathon Bellows, MD;  Location:  [facility] ENDOSCOPY;  Service: Gastroenterology;  Laterality: N/A;   EYE SURGERY     eyelid surgery Right June 19, 2015   [facility] by Dr. Norlene Duel   LAPAROSCOPIC BILATERAL SALPINGECTOMY N/A 01/25/2016   Procedure: LAPAROSCOPIC BILATERAL SALPINGECTOMY, ADHESIOLYSIS, LAPAROSCOPIC EXCISION/FULGERATION ENDOMETRIOSIS ;  Surgeon: [name], MD;  Location: [facility] ORS;  Service: Gynecology;  Laterality: N/A;   Laparoscopic excision and fulguration of endometriosis N/A    bladder flap and uterosacral ligament endometriosis, excised and cauterized   UPPER GI ENDOSCOPY  07/19/2012   VAGINAL HYSTERECTOMY N/A 07/31/2017   Procedure: HYSTERECTOMY VAGINAL;  Surgeon: [name], MD;  Location: [facility] ORS;  Service: Gynecology;  Laterality: N/A;    Family History  Adopted: Yes  Problem Relation Age of Onset   Hyperlipidemia Mother    Hypertension Mother    Diabetes Father    Heart failure Father 23       hear attack   Cancer Neg Hx    Breast cancer Neg Hx     Social History   Tobacco Use   Smoking status: Never   Smokeless tobacco: Never  Substance Use Topics   Alcohol use: Not Currently    Alcohol/week: 0.0 standard drinks of alcohol    Comment: occ. wine     Current Outpatient Medications:    albuterol (VENTOLIN HFA) 108 (90 Base) MCG/ACT inhaler, Inhale 2 puffs into the lungs every 6 (six) hours as needed for wheezing or shortness of breath., Disp: 18 g, Rfl: 0   carboxymethylcellul-glycerin (REFRESH OPTIVE) 0.5-0.9 % ophthalmic solution, Frequency:PHARMDIR   Dosage:0.0     Instructions:  Note:6 x OU Dose: 0.5%-0.9%, Disp: , Rfl:    conjugated estrogens (PREMARIN) vaginal cream, Place 1 Applicatorful vaginally daily. For the first 2 weeks after that a few times a week - pea size on urethra and vaginal entry only, Disp: 42.5 g, Rfl: 12   cyclobenzaprine (FLEXERIL) 5 MG tablet, Take 1-2 tablets (5-10 mg total) by mouth 3 (three) times daily as needed for muscle spasms. Take  mainly at bedtime as may make drowsy, do not take before driving., Disp: 90 tablet, Rfl: 0   dexlansoprazole (DEXILANT) 60 MG capsule, Take 1 capsule (60 mg total) by mouth daily., Disp: 90 capsule, Rfl: 3   fluticasone-salmeterol (ADVAIR) 100-50 MCG/ACT AEPB, Inhale 1 puff into the lungs 2 (two) times daily., Disp: 1 each, Rfl: 5   hydrOXYzine (ATARAX) 10 MG tablet, Take 1-2 tablets (10-20 mg total) by mouth 3 (three) times daily as needed., Disp: 90 tablet, Rfl: 0   ibuprofen (ADVIL) 800 MG tablet, Take 1 tablet (800 mg total) by mouth every 8 (eight) hours as needed., Disp: 30 tablet, Rfl: 0   montelukast (SINGULAIR) 10 MG tablet, Take 1 tablet (10 mg total) by mouth at bedtime., Disp: 90 tablet, Rfl: 1   MULTIPLE VITAMIN PO, Take 1 tablet by mouth daily., Disp: , Rfl:    Plecanatide 3 MG TABS, Take 1 tablet by mouth daily., Disp: 90 tablet, Rfl: 3   prednisoLONE acetate (PRED FORTE) 1 % ophthalmic  suspension, Place 1 drop into the right eye daily., Disp: , Rfl:    promethazine (PHENERGAN) 12.5 MG tablet, Take 1 tablet (12.5 mg total) by mouth every 8 (eight) hours as needed for nausea or vomiting., Disp: 20 tablet, Rfl: 0   rizatriptan (MAXALT) 10 MG tablet, Take 1 tablet (10 mg total) by mouth 2 (two) times daily as needed., Disp: 10 tablet, Rfl: 1   triamcinolone (KENALOG) 0.1 %, Apply 1 application topically 2 (two) times daily as needed., Disp: 80 g, Rfl: 0   Ubrogepant (UBRELVY) 100 MG TABS, Take 1 tablet by mouth every other day., Disp: 16 tablet, Rfl: 5   valACYclovir (VALTREX) 500 MG tablet, Take 1 tablet by mouth 2 (two) times daily., Disp: , Rfl: 0   cetirizine (ZYRTEC) 5 MG tablet, Take by mouth., Disp: , Rfl:   Allergies  Allergen Reactions   Bactrim [Sulfamethoxazole-Trimethoprim] Nausea Only   Penicillins Nausea Only   Topamax [Topiramate] Other (See Comments)    Fatigue     I personally reviewed active problem list, medication list, allergies, family history, social  history, health maintenance with the patient/caregiver today.   ROS  Constitutional: Negative for fever or weight change.  Respiratory: Negative for cough and shortness of breath.   Cardiovascular: Negative for chest pain or palpitations.  Gastrointestinal: Negative for abdominal pain, no bowel changes.  Musculoskeletal: Negative for gait problem or joint swelling.  Skin: Negative for rash.  Neurological: Negative for dizziness or headache.  No other specific complaints in a complete review of systems (except as listed in HPI above).   Objective  Vitals:   09/30/22 1446  BP: 116/68  Pulse: 83  Resp: 16  SpO2: 99%  Weight: 122 lb (55.3 kg)  Height: '5\' 5"'$  (1.651 m)    Body mass index is 20.3 kg/m.  Physical Exam  Constitutional: Patient appears well-developed and well-nourished.  No distress.  HEENT: head atraumatic, normocephalic, pupils equal and reactive to light, neck supple, throat within normal limits Cardiovascular: Normal rate, regular rhythm and normal heart sounds.  No murmur heard. No BLE edema. Pulmonary/Chest: Effort normal and breath sounds normal. No respiratory distress. Abdominal: Soft.  There is no tenderness. Psychiatric: Patient has a normal mood and affect. behavior is normal. Judgment and thought content normal.   Recent Results (from the past 2160 hour(s))  POCT Urinalysis Dipstick     Status: None   Collection Time: 09/30/22  2:56 PM  Result Value Ref Range   Color, UA Yellow    Clarity, UA Clear    Glucose, UA Negative Negative   Bilirubin, UA Negative    Ketones, UA Negative    Spec Grav, UA 1.010 1.010 - 1.025   Blood, UA Negative    pH, UA 6.5 5.0 - 8.0   Protein, UA Negative Negative   Urobilinogen, UA 0.2 0.2 or 1.0 E.U./dL   Nitrite, UA Negative    Leukocytes, UA Negative Negative   Appearance Normal    Odor None     PHQ2/9:    09/30/2022    2:46 PM 07/01/2022    1:36 PM 06/13/2022    1:07 PM 04/01/2022    1:48 PM 02/22/2022     1:48 PM  Depression screen PHQ 2/9  Decreased Interest '1 1 1 1 '$ 0  Down, Depressed, Hopeless 1 1 0 1 0  PHQ - 2 Score '2 2 1 2 '$ 0  Altered sleeping 0 1 0 2   Tired, decreased energy 0 1 0 0  Change in appetite 0 0 0 0   Feeling bad or failure about yourself  0 0 0 0   Trouble concentrating 0 0 0 0   Moving slowly or fidgety/restless 0 0 0 0   Suicidal thoughts 0 0 0 0   PHQ-9 Score '2 4 1 4   '$ Difficult doing work/chores  Not difficult at all Not difficult at all      phq 9 is positive   Fall Risk:    09/30/2022    2:46 PM 07/01/2022    1:36 PM 06/13/2022    1:07 PM 04/06/2022    8:58 AM 04/01/2022    1:48 PM  Fall Risk   Falls in the past year? 0 0 0 0 0  Number falls in past yr: 0  0 0 0  Injury with Fall? 0  0 0 0  Risk for fall due to : No Fall Risks No Fall Risks No Fall Risks No Fall Risks No Fall Risks  Follow up Falls prevention discussed Falls prevention discussed Falls prevention discussed;Education provided Falls prevention discussed Falls prevention discussed      Functional Status Survey: Is the patient deaf or have difficulty hearing?: No Does the patient have difficulty seeing, even when wearing glasses/contacts?: No Does the patient have difficulty concentrating, remembering, or making decisions?: No Does the patient have difficulty walking or climbing stairs?: No Does the patient have difficulty dressing or bathing?: No Does the patient have difficulty doing errands alone such as visiting a doctor's office or shopping?: No    Assessment & Plan  1. Vaginal itching  - Cervicovaginal ancillary only - fluconazole (DIFLUCAN) 150 MG tablet; Take 1 tablet (150 mg total) by mouth every other day.  Dispense: 3 tablet; Refill: 0  2. Dysuria  - POCT Urinalysis Dipstick - CULTURE, URINE COMPREHENSIVE  3. Vaginal atrophy  - conjugated estrogens (PREMARIN) vaginal cream; Place vaginally daily. For the first 2 weeks after that a few times a week - pea size on  urethra and vaginal entry only  Dispense: 42.5 g; Refill: 5  4. Spasm of muscle of lower back  - cyclobenzaprine (FLEXERIL) 5 MG tablet; Take 1-2 tablets (5-10 mg total) by mouth 3 (three) times daily as needed for muscle spasms. Take mainly at bedtime as may make drowsy, do not take before driving.  Dispense: 90 tablet; Refill: 0  5. Moderate persistent asthma without complication  - montelukast (SINGULAIR) 10 MG tablet; Take 1 tablet (10 mg total) by mouth at bedtime.  Dispense: 90 tablet; Refill: 1  6. Perennial allergic rhinitis with seasonal variation  - montelukast (SINGULAIR) 10 MG tablet; Take 1 tablet (10 mg total) by mouth at bedtime.  Dispense: 90 tablet; Refill: 1  7. Psychophysiological insomnia  - hydrOXYzine (ATARAX) 10 MG tablet; Take 1-2 tablets (10-20 mg total) by mouth 3 (three) times daily as needed.  Dispense: 90 tablet; Refill: 0  8. Anxiety  - hydrOXYzine (ATARAX) 10 MG tablet; Take 1-2 tablets (10-20 mg total) by mouth 3 (three) times daily as needed.  Dispense: 90 tablet; Refill: 0

## 2022-09-29 ENCOUNTER — Encounter: Payer: Self-pay | Admitting: Oncology

## 2022-09-30 ENCOUNTER — Other Ambulatory Visit (HOSPITAL_COMMUNITY)
Admission: RE | Admit: 2022-09-30 | Discharge: 2022-09-30 | Disposition: A | Discharge status: Home / Self Care | Payer: 59 | Source: Ambulatory Visit | Attending: Family Medicine | Admitting: Family Medicine

## 2022-09-30 ENCOUNTER — Ambulatory Visit: Payer: 59 | Admitting: Family Medicine

## 2022-09-30 ENCOUNTER — Encounter: Payer: Self-pay | Admitting: Family Medicine

## 2022-09-30 VITALS — BP 116/68 | HR 83 | Resp 16 | Ht 65.0 in | Wt 122.0 lb

## 2022-09-30 DIAGNOSIS — R300 Dysuria: Secondary | ICD-10-CM

## 2022-09-30 DIAGNOSIS — M62830 Muscle spasm of back: Secondary | ICD-10-CM | POA: Insufficient documentation

## 2022-09-30 DIAGNOSIS — N898 Other specified noninflammatory disorders of vagina: Secondary | ICD-10-CM | POA: Diagnosis present

## 2022-09-30 DIAGNOSIS — F5104 Psychophysiologic insomnia: Secondary | ICD-10-CM

## 2022-09-30 DIAGNOSIS — J3089 Other allergic rhinitis: Secondary | ICD-10-CM

## 2022-09-30 DIAGNOSIS — F419 Anxiety disorder, unspecified: Secondary | ICD-10-CM

## 2022-09-30 DIAGNOSIS — J4540 Moderate persistent asthma, uncomplicated: Secondary | ICD-10-CM

## 2022-09-30 DIAGNOSIS — J302 Other seasonal allergic rhinitis: Secondary | ICD-10-CM | POA: Insufficient documentation

## 2022-09-30 DIAGNOSIS — N952 Postmenopausal atrophic vaginitis: Secondary | ICD-10-CM | POA: Diagnosis not present

## 2022-09-30 DIAGNOSIS — G43109 Migraine with aura, not intractable, without status migrainosus: Secondary | ICD-10-CM

## 2022-09-30 DIAGNOSIS — K219 Gastro-esophageal reflux disease without esophagitis: Secondary | ICD-10-CM

## 2022-09-30 LAB — POCT URINALYSIS DIPSTICK
Appearance: NORMAL
Bilirubin, UA: NEGATIVE
Blood, UA: NEGATIVE
Glucose, UA: NEGATIVE
Ketones, UA: NEGATIVE
Leukocytes, UA: NEGATIVE
Nitrite, UA: NEGATIVE
Protein, UA: NEGATIVE
Spec Grav, UA: 1.01 (ref 1.010–1.025)
Urobilinogen, UA: 0.2 E.U./dL
pH, UA: 6.5 (ref 5.0–8.0)

## 2022-09-30 MED ORDER — HYDROXYZINE HCL 10 MG PO TABS: 10.0000 mg | ORAL_TABLET | Freq: Three times a day (TID) | ORAL | 0 refills | Status: DC | PRN

## 2022-09-30 MED ORDER — FLUCONAZOLE 150 MG PO TABS: 150.0000 mg | ORAL_TABLET | ORAL | 0 refills | Status: DC

## 2022-09-30 MED ORDER — PREMARIN 0.625 MG/GM VA CREA: TOPICAL_CREAM | Freq: Every day | VAGINAL | 5 refills | Status: DC

## 2022-09-30 MED ORDER — MONTELUKAST SODIUM 10 MG PO TABS: 10.0000 mg | ORAL_TABLET | Freq: Every day | ORAL | 1 refills | Status: DC

## 2022-09-30 MED ORDER — CYCLOBENZAPRINE HCL 5 MG PO TABS: 5.0000 mg | ORAL_TABLET | Freq: Three times a day (TID) | ORAL | 0 refills | Status: DC | PRN

## 2022-10-02 LAB — CULTURE, URINE COMPREHENSIVE
MICRO NUMBER:: 14084416
RESULT:: NO GROWTH
SPECIMEN QUALITY:: ADEQUATE

## 2022-10-04 LAB — CERVICOVAGINAL ANCILLARY ONLY
Bacterial Vaginitis (gardnerella): NEGATIVE
Candida Glabrata: NEGATIVE
Candida Vaginitis: NEGATIVE
Comment: NEGATIVE
Comment: NEGATIVE
Comment: NEGATIVE

## 2022-10-05 ENCOUNTER — Encounter: Payer: Self-pay | Admitting: Family Medicine

## 2022-10-17 ENCOUNTER — Ambulatory Visit: Payer: 59 | Admitting: Family Medicine

## 2022-10-17 ENCOUNTER — Encounter: Payer: Self-pay | Admitting: Family Medicine

## 2022-10-17 VITALS — BP 116/76 | HR 68 | Temp 98.0°F | Resp 16 | Ht 66.0 in | Wt 123.0 lb

## 2022-10-17 DIAGNOSIS — E538 Deficiency of other specified B group vitamins: Secondary | ICD-10-CM | POA: Diagnosis not present

## 2022-10-17 MED ORDER — CYANOCOBALAMIN 1000 MCG/ML IJ SOLN
1000.0000 ug | Freq: Once | INTRAMUSCULAR | Status: AC
  Administered 2022-10-17: 1000 ug via INTRAMUSCULAR

## 2022-10-17 MED ORDER — PREDNISONE 10 MG (21) PO TBPK: ORAL_TABLET | ORAL | 0 refills | Status: DC

## 2022-10-17 NOTE — Progress Notes (Signed)
   SUBJECTIVE:   CHIEF COMPLAINT / HPI:   UPPER RESPIRATORY TRACT INFECTION - symptom onset 1 week ago - h/o asthma, on advair, albuterol prn  - COVID negative yesterday  Worst symptom: Fever: no Cough: yes, mucus Shortness of breath: no Chest pain: no Chest tightness: no Chest congestion: no Nasal congestion: yes Runny nose: yes Ear pain: yes right Vomiting: no Sick contacts: yes Context: stable Relief with OTC cold/cough medications: no  Treatments attempted:  flonase, albuterol, sudafed, dayquil    OBJECTIVE:   BP 116/76 (BP Location: Left Arm, Patient Position: Sitting, Cuff Size: Normal)   Pulse 68   Temp 98 F (36.7 C) (Oral)   Resp 16   Ht '5\' 6"'$  (1.676 m) Comment: per patient  Wt 123 lb (55.8 kg)   LMP 07/11/2017 (Exact Date) Comment: patient still has her ovaries  SpO2 100%   BMI 19.85 kg/m   Gen: tired appearing, in NAD Card: RRR Lungs: CTAB, no wheeze/rales/rhonchi Ext: WWP, no edema   ASSESSMENT/PLAN:   VIRAL URI Doing well with mild sx. COVID negative. Asthma well controlled. Given lack of symptom relief with OTC symptom relief, will provide steroid taper.  Reviewed return and emergency precautions.     Myles Gip, DO

## 2022-10-24 ENCOUNTER — Ambulatory Visit: Payer: 59 | Admitting: Obstetrics & Gynecology

## 2022-10-24 ENCOUNTER — Telehealth: Payer: Self-pay | Admitting: Family Medicine

## 2022-10-24 NOTE — Telephone Encounter (Unsigned)
Copied [place] [phone]. Topic: General - Other >> Oct 24, 2022  4:15 PM Chapman Fitch wrote: Reason for CRM: pt called to schedule her yearly mammogram and they advised her that she needed an ultrasound but pt has already had one done this year/ please advise so pt can get a mammogram

## 2022-10-26 NOTE — Telephone Encounter (Signed)
She does not want to do a repeat US due to cost even after insurance.

## 2022-11-07 ENCOUNTER — Telehealth: Payer: Self-pay | Admitting: Gastroenterology

## 2022-11-07 NOTE — Telephone Encounter (Signed)
Patient would like to cancel upcoming procedure.

## 2022-11-08 NOTE — Telephone Encounter (Signed)
Spoken to patient and she stated that she had the flu right now. Needs to reschedule her EGD which is on 11/11/2022.  Patient request to change procedure to 01/06/2023. Called [facility] endo unit to make the change. New instructions will be sent out.  Patient verbalized understanding.

## 2022-11-08 NOTE — Telephone Encounter (Signed)
Patient has had the flu and wants to reschedule to January

## 2022-11-15 ENCOUNTER — Telehealth: Payer: Self-pay

## 2022-11-15 NOTE — Telephone Encounter (Signed)
Patient lvm requesting a call back in regards to her EGD scheduled with Dr. Vicente Males.  Please return call.  No other details provided in message.   Thanks,  [place], [place]

## 2022-11-16 NOTE — Telephone Encounter (Signed)
Called patient and she stated that she wanted to reschedule her EGD for next year on 01/13/2023. Called endoscopy unit and spoke with Daquita and she stated that she would write it down and give it to Trish to change the date.

## 2022-11-18 ENCOUNTER — Ambulatory Visit (INDEPENDENT_AMBULATORY_CARE_PROVIDER_SITE_OTHER): Payer: 59

## 2022-11-18 DIAGNOSIS — E538 Deficiency of other specified B group vitamins: Secondary | ICD-10-CM

## 2022-11-18 MED ORDER — CYANOCOBALAMIN 1000 MCG/ML IJ SOLN
1000.0000 ug | Freq: Once | INTRAMUSCULAR | Status: AC
  Administered 2022-11-18: 1000 ug via INTRAMUSCULAR

## 2022-11-20 ENCOUNTER — Other Ambulatory Visit: Payer: Self-pay | Admitting: Family Medicine

## 2022-11-21 MED ORDER — ALBUTEROL SULFATE HFA 108 (90 BASE) MCG/ACT IN AERS: 2.0000 | INHALATION_SPRAY | Freq: Four times a day (QID) | RESPIRATORY_TRACT | 0 refills | Status: DC | PRN

## 2022-11-28 ENCOUNTER — Other Ambulatory Visit (HOSPITAL_COMMUNITY)
Admission: RE | Admit: 2022-11-28 | Discharge: 2022-11-28 | Disposition: A | Discharge status: Home / Self Care | Payer: 59 | Source: Ambulatory Visit | Attending: Obstetrics & Gynecology | Admitting: Obstetrics & Gynecology

## 2022-11-28 ENCOUNTER — Encounter: Payer: Self-pay | Admitting: Obstetrics & Gynecology

## 2022-11-28 ENCOUNTER — Ambulatory Visit (INDEPENDENT_AMBULATORY_CARE_PROVIDER_SITE_OTHER): Payer: 59 | Admitting: Obstetrics & Gynecology

## 2022-11-28 VITALS — BP 114/57 | HR 59 | Ht 65.0 in | Wt 123.0 lb

## 2022-11-28 DIAGNOSIS — Z01411 Encounter for gynecological examination (general) (routine) with abnormal findings: Secondary | ICD-10-CM

## 2022-11-28 DIAGNOSIS — N760 Acute vaginitis: Secondary | ICD-10-CM | POA: Diagnosis present

## 2022-11-28 DIAGNOSIS — N951 Menopausal and female climacteric states: Secondary | ICD-10-CM

## 2022-11-28 DIAGNOSIS — N898 Other specified noninflammatory disorders of vagina: Secondary | ICD-10-CM | POA: Insufficient documentation

## 2022-11-28 DIAGNOSIS — Z1231 Encounter for screening mammogram for malignant neoplasm of breast: Secondary | ICD-10-CM

## 2022-11-28 DIAGNOSIS — Z01419 Encounter for gynecological examination (general) (routine) without abnormal findings: Secondary | ICD-10-CM

## 2022-11-28 MED ORDER — IMVEXXY MAINTENANCE PACK 10 MCG VA INST: VAGINAL_INSERT | VAGINAL | 5 refills | Status: DC

## 2022-11-28 MED ORDER — FLUCONAZOLE 150 MG PO TABS: 150.0000 mg | ORAL_TABLET | Freq: Once | ORAL | 0 refills | Status: AC

## 2022-11-28 MED ORDER — METRONIDAZOLE 500 MG PO TABS: 500.0000 mg | ORAL_TABLET | Freq: Two times a day (BID) | ORAL | 0 refills | Status: DC

## 2022-11-28 NOTE — Progress Notes (Signed)
Subjective:    Jessica Carroll is a 51 y.o. monogamous P0  who presents for an annual exam. She reports vaginal dryness which is making sex uncomfortable. She has tried oral estrogen twice and both times has disliked it and stopped it.  She had a hysterectomy and has been having hot flashes for years. The patient is sexually active. GYN screening history: last pap: was normal. The patient wears seatbelts: yes. The patient participates in regular exercise: no. Has the patient ever been transfused or tattooed?: no. The patient reports that there is not domestic violence in her life.   Menstrual History: OB History     Gravida  0   Para  0   Term  0   Preterm  0   AB  0   Living  0      SAB  0   IAB  0   Ectopic  0   Multiple  0   Live Births           Obstetric Comments  1st Menstrual Cycle:  11           Patient's last menstrual period was 07/11/2017 (exact date).    The following portions of the patient's history were reviewed and updated as appropriate: allergies, current medications, past family history, past medical history, past social history, past surgical history, and problem list.  Review of Systems Pertinent items are noted in HPI.  She denies a FH of breast/gyn/colon cancers. She works 2 jobs.   Objective:    BP (!) 114/57   Pulse (!) 59   Ht '5\' 5"'$  (1.651 m)   Wt 123 lb (55.8 kg)   LMP 07/11/2017 (Exact Date) Comment: patient still has her ovaries  BMI 20.47 kg/m   General Appearance:    Alert, cooperative, no distress, appears stated age  Head:    Normocephalic, without obvious abnormality, atraumatic  Eyes:    PERRL, conjunctiva/corneas clear, EOM's intact, fundi    benign, both eyes  Ears:    Normal TM's and external ear canals, both ears  Nose:   Nares normal, septum midline, mucosa normal, no drainage    or sinus tenderness  Throat:   Lips, mucosa, and tongue normal; teeth and gums normal  Neck:   Supple, symmetrical, trachea  midline, no adenopathy;    thyroid:  no enlargement/tenderness/nodules; no carotid   bruit or JVD  Back:     Symmetric, no curvature, ROM normal, no CVA tenderness  Lungs:     Clear to auscultation bilaterally, respirations unlabored  Chest Wall:    No tenderness or deformity   Heart:    Regular rate and rhythm, S1 and S2 normal, no murmur, rub   or gallop  Breast Exam:    No tenderness, masses, or nipple abnormality  Abdomen:     Soft, non-tender, bowel sounds active all four quadrants,    no masses, no organomegaly  Genitalia:    Normal female without lesion, discharge or tenderness, moderate VVA, frothy vaginal discharge noted with Pederson speculum exam Bimanual reveals no tenderness or masses     Extremities:   Extremities normal, atraumatic, no cyanosis or edema  Pulses:   2+ and symmetric all extremities  Skin:   Skin color, texture, turgor normal, no rashes or lesions  Lymph nodes:   Cervical, supraclavicular, and axillary nodes normal  Neurologic:   CNII-XII intact, normal strength, sensation and reflexes    throughout  .    Assessment:  Healthy female exam.  Vaginal dryness Recurrent BV with discharge present on exam   Plan:  Mammogram ordered (she says that she will not spend the money on another ultrasound as is rec'd per radiology) Imvexxy for vaginal dryness ordered Rec boric acid supps prn Flagyl prescribed Diflucan prescribed (She says that when she takes ABX she gets yeast infections.

## 2022-11-28 NOTE — Addendum Note (Signed)
Addended by: Quintella Baton D on: 11/28/2022 08:48 AM   Modules accepted: Orders

## 2022-11-29 LAB — CERVICOVAGINAL ANCILLARY ONLY
Bacterial Vaginitis (gardnerella): POSITIVE — AB
Candida Glabrata: NEGATIVE
Candida Vaginitis: NEGATIVE
Comment: NEGATIVE
Comment: NEGATIVE
Comment: NEGATIVE

## 2022-12-23 ENCOUNTER — Ambulatory Visit (INDEPENDENT_AMBULATORY_CARE_PROVIDER_SITE_OTHER): Payer: 59

## 2022-12-23 DIAGNOSIS — E538 Deficiency of other specified B group vitamins: Secondary | ICD-10-CM

## 2022-12-23 MED ORDER — CYANOCOBALAMIN 1000 MCG/ML IJ SOLN
1000.0000 ug | Freq: Once | INTRAMUSCULAR | Status: AC
  Administered 2022-12-23: 1000 ug via INTRAMUSCULAR

## 2022-12-27 ENCOUNTER — Other Ambulatory Visit: Payer: Self-pay | Admitting: Family Medicine

## 2022-12-27 DIAGNOSIS — G43109 Migraine with aura, not intractable, without status migrainosus: Secondary | ICD-10-CM

## 2022-12-27 NOTE — Telephone Encounter (Signed)
Medication Refill - Medication:  Ubrogepant (UBRELVY) 100 MG TABS [[id number]]   Has the patient contacted their pharmacy? Yes.   (Agent: If no, request that the patient contact the pharmacy for the refill. If patient does not wish to contact the pharmacy document the reason why and proceed with request.) (Agent: If yes, when and what did the pharmacy advise?) they faxed request to office   Preferred Pharmacy (with phone number or street name): [facility] [place] - [address]  Has the patient been seen for an appointment in the last year OR does the patient have an upcoming appointment? Yes.    Agent: Please be advised that RX refills may take up to 3 business days. We ask that you follow-up with your pharmacy.

## 2022-12-28 ENCOUNTER — Other Ambulatory Visit: Payer: Self-pay

## 2022-12-28 DIAGNOSIS — G43109 Migraine with aura, not intractable, without status migrainosus: Secondary | ICD-10-CM

## 2022-12-28 MED ORDER — UBRELVY 100 MG PO TABS: 1.0000 | ORAL_TABLET | ORAL | 5 refills | Status: DC

## 2022-12-28 NOTE — Telephone Encounter (Signed)
Requested medication (s) are due for refill today -yes  Requested medication (s) are on the active medication list -yes  Future visit scheduled -yes  Last refill: 04/01/22 #16 5RF  Notes to clinic: medication not assigned protocol- provider review   Requested Prescriptions  Pending Prescriptions Disp Refills   Ubrogepant (UBRELVY) 100 MG TABS 16 tablet 5    Sig: Take 1 tablet by mouth every other day.     Off-Protocol Failed - 12/27/2022  4:18 PM      Failed - Medication not assigned to a protocol, review manually.      Passed - Valid encounter within last 12 months    Recent Outpatient Visits           2 months ago B12 deficiency   [place], [place]   2 months ago Vaginal itching   [facility] [place], Drue Stager, MD   6 months ago Moderate persistent asthma without complication   [facility] Steele Sizer, MD   6 months ago Urinary frequency   [facility] Bo Merino, FNP   8 months ago Upper respiratory tract infection, unspecified type   [place], PA-C       Future Appointments             In 1 week Steele Sizer, MD [facility], [facility]               Requested Prescriptions  Pending Prescriptions Disp Refills   Ubrogepant (UBRELVY) 100 MG TABS 16 tablet 5    Sig: Take 1 tablet by mouth every other day.     Off-Protocol Failed - 12/27/2022  4:18 PM      Failed - Medication not assigned to a protocol, review manually.      Passed - Valid encounter within last 12 months    Recent Outpatient Visits           2 months ago B12 deficiency   [place], DO   2 months ago Vaginal itching   [facility] [place], Drue Stager, MD   6 months ago Moderate persistent asthma without complication   [facility] Steele Sizer, MD   6 months  ago Urinary frequency   [facility] Bo Merino, FNP   8 months ago Upper respiratory tract infection, unspecified type   [place], PA-C       Future Appointments             In 1 week Steele Sizer, MD [facility], [facility]

## 2022-12-29 ENCOUNTER — Other Ambulatory Visit: Payer: Self-pay | Admitting: Family Medicine

## 2022-12-29 DIAGNOSIS — N63 Unspecified lump in unspecified breast: Secondary | ICD-10-CM

## 2023-01-02 ENCOUNTER — Ambulatory Visit: Payer: 59 | Admitting: Family Medicine

## 2023-01-05 NOTE — Patient Instructions (Signed)
Preventive Care 53-53 Years Old, Female Preventive care refers to lifestyle choices and visits with your health care provider that can promote health and wellness. Preventive care visits are also called wellness exams. What can I expect for my preventive care visit? Counseling Your health care provider may ask you questions about your: Medical history, including: Past medical problems. Family medical history. Pregnancy history. Current health, including: Menstrual cycle. Method of birth control. Emotional well-being. Home life and relationship well-being. Sexual activity and sexual health. Lifestyle, including: Alcohol, nicotine or tobacco, and drug use. Access to firearms. Diet, exercise, and sleep habits. Work and work environment. Sunscreen use. Safety issues such as seatbelt and bike helmet use. Physical exam Your health care provider will check your: Height and weight. These may be used to calculate your BMI (body mass index). BMI is a measurement that tells if you are at a healthy weight. Waist circumference. This measures the distance around your waistline. This measurement also tells if you are at a healthy weight and may help predict your risk of certain diseases, such as type 2 diabetes and high blood pressure. Heart rate and blood pressure. Body temperature. Skin for abnormal spots. What immunizations do I need?  Vaccines are usually given at various ages, according to a schedule. Your health care provider will recommend vaccines for you based on your age, medical history, and lifestyle or other factors, such as travel or where you work. What tests do I need? Screening Your health care provider may recommend screening tests for certain conditions. This may include: Lipid and cholesterol levels. Diabetes screening. This is done by checking your blood sugar (glucose) after you have not eaten for a while (fasting). Pelvic exam and Pap test. Hepatitis B test. Hepatitis C  test. HIV (human immunodeficiency virus) test. STI (sexually transmitted infection) testing, if you are at risk. Lung cancer screening. Colorectal cancer screening. Mammogram. Talk with your health care provider about when you should start having regular mammograms. This may depend on whether you have a family history of breast cancer. BRCA-related cancer screening. This may be done if you have a family history of breast, ovarian, tubal, or peritoneal cancers. Bone density scan. This is done to screen for osteoporosis. Talk with your health care provider about your test results, treatment options, and if necessary, the need for more tests. Follow these instructions at home: Eating and drinking  Eat a diet that includes fresh fruits and vegetables, whole grains, lean protein, and low-fat dairy products. Take vitamin and mineral supplements as recommended by your health care provider. Do not drink alcohol if: Your health care provider tells you not to drink. You are pregnant, may be pregnant, or are planning to become pregnant. If you drink alcohol: Limit how much you have to 0-1 drink a day. Know how much alcohol is in your drink. In the [place] one drink equals one 12 oz bottle of beer (355 mL), one 5 oz glass of wine (148 mL), or one 1 oz glass of hard liquor (44 mL). Lifestyle Brush your teeth every morning and night with fluoride toothpaste. Floss one time each day. Exercise for at least 30 minutes 5 or more days each week. Do not use any products that contain nicotine or tobacco. These products include cigarettes, chewing tobacco, and vaping devices, such as e-cigarettes. If you need help quitting, ask your health care provider. Do not use drugs. If you are sexually active, practice safe sex. Use a condom or other form of protection to   prevent STIs. If you do not wish to become pregnant, use a form of birth control. If you plan to become pregnant, see your health care provider for a  prepregnancy visit. Take aspirin only as told by your health care provider. Make sure that you understand how much to take and what form to take. Work with your health care provider to find out whether it is safe and beneficial for you to take aspirin daily. Find healthy ways to manage stress, such as: Meditation, yoga, or listening to music. Journaling. Talking to a trusted person. Spending time with friends and family. Minimize exposure to UV radiation to reduce your risk of skin cancer. Safety Always wear your seat belt while driving or riding in a vehicle. Do not drive: If you have been drinking alcohol. Do not ride with someone who has been drinking. When you are tired or distracted. While texting. If you have been using any mind-altering substances or drugs. Wear a helmet and other protective equipment during sports activities. If you have firearms in your house, make sure you follow all gun safety procedures. Seek help if you have been physically or sexually abused. What's next? Visit your health care provider once a year for an annual wellness visit. Ask your health care provider how often you should have your eyes and teeth checked. Stay up to date on all vaccines. This information is not intended to replace advice given to you by your health care provider. Make sure you discuss any questions you have with your health care provider. Document Revised: 05/26/2021 Document Reviewed: 05/26/2021 Elsevier Patient Education  [place].

## 2023-01-05 NOTE — Progress Notes (Signed)
Name: Jessica Carroll   MRN: [id number]    DOB: 25-Jul-1971   Date:01/06/2023       Progress Note  Subjective  Chief Complaint  Annual Exam  HPI  Patient presents for annual CPE.  Diet: cook at home most of the time, packs her lunch, eats fruit , vegetables, lots of grilled chilcken Exercise:  working two jobs - physical  - discussed yoga Last Eye Exam: up to date Last Dental Exam: up to date   [employer] Video Visit from 02/22/2022 in [facility]  AUDIT-C Score 0      Depression: Phq 9 is  positive - friend diagnosed with breast cancer     01/06/2023    7:52 AM 10/17/2022   11:33 AM 09/30/2022    2:46 PM 07/01/2022    1:36 PM 06/13/2022    1:07 PM  Depression screen PHQ 2/9  Decreased Interest 1 0 '1 1 1  '$ Down, Depressed, Hopeless 1 0 1 1 0  PHQ - 2 Score 2 0 '2 2 1  '$ Altered sleeping 3 1 0 1 0  Tired, decreased energy 0 0 0 1 0  Change in appetite 0 0 0 0 0  Feeling bad or failure about yourself  0 0 0 0 0  Trouble concentrating 0 0 0 0 0  Moving slowly or fidgety/restless 0 0 0 0 0  Suicidal thoughts 0 0 0 0 0  PHQ-9 Score '5 1 2 4 1  '$ Difficult doing work/chores  Not difficult at all  Not difficult at all Not difficult at all   Hypertension: BP Readings from Last 3 Encounters:  01/06/23 116/64  11/28/22 (!) 114/57  10/17/22 116/76   Obesity: Wt Readings from Last 3 Encounters:  01/06/23 118 lb (53.5 kg)  11/28/22 123 lb (55.8 kg)  10/17/22 123 lb (55.8 kg)   BMI Readings from Last 3 Encounters:  01/06/23 19.64 kg/m  11/28/22 20.47 kg/m  10/17/22 19.85 kg/m     Vaccines:    Tdap: up to date Shingrix: discussed with patient  Pneumonia: up to date  Flu: up to date COVID-85: refused    Hep C Screening: 12/24/19 STD testing and prevention (HIV/chl/gon/syphilis): 12/17/15 Intimate partner violence: negative screen  Sexual History :one partner, no pain  Menstrual History/LMP/Abnormal Bleeding: s/p hysterectomy  Discussed  importance of follow up if any post-menopausal bleeding: yes  Incontinence Symptoms: negative for symptoms   Breast cancer:  - Last Mammogram: Scheduled for 03/30/23 - BRCA gene screening: N/A  Osteoporosis Prevention : Discussed high calcium and vitamin D supplementation, weight bearing exercises Bone density: N/A  Cervical cancer screening: N/A  Skin cancer: Discussed monitoring for atypical lesions  Colorectal cancer: 09/29/17  , she is losing weight has some nausea and is having an EGD done in a couple of weeks  Lung cancer:  Low Dose CT Chest recommended if Age 41-80 years, 20 pack-year currently smoking OR have quit w/in 15years. Patient does not qualify for screen   ECG: 05/03/10  Advanced Care Planning: A voluntary discussion about advance care planning including the explanation and discussion of advance directives.  Discussed health care proxy and Living will, and the patient was able to identify a health care proxy as mother .  Patient does not have a living will and power of attorney of health care   Lipids: Lab Results  Component Value Date   CHOL 201 (H) 12/31/2021   CHOL 188 12/25/2020   CHOL 197 12/24/2019  Lab Results  Component Value Date   HDL 64 12/31/2021   HDL 58 12/25/2020   HDL 68 12/24/2019   Lab Results  Component Value Date   LDLCALC 120 (H) 12/31/2021   LDLCALC 116 (H) 12/25/2020   LDLCALC 118 (H) 12/24/2019   Lab Results  Component Value Date   TRIG 71 12/31/2021   TRIG 57 12/25/2020   TRIG 58 12/24/2019   Lab Results  Component Value Date   CHOLHDL 3.1 12/31/2021   CHOLHDL 3.2 12/25/2020   CHOLHDL 2.9 12/24/2019   No results found for: "LDLDIRECT"  Glucose: Glucose, Bld  Date Value Ref Range Status  07/01/2022 92 65 - 99 mg/dL Final    Comment:    .            Fasting reference interval .   12/31/2021 80 65 - 99 mg/dL Final    Comment:    .            Fasting reference interval .   12/25/2020 94 65 - 99 mg/dL Final     Comment:    .            Fasting reference interval .     Patient Active Problem List   Diagnosis Date Noted   Migraine with aura and without status migrainosus, not intractable 09/30/2022   Psychophysiological insomnia 09/30/2022   Perennial allergic rhinitis with seasonal variation 09/30/2022   Spasm of muscle of lower back 09/30/2022   Age-related nuclear cataract of right eye 01/19/2021   Corneal scar, right eye 01/19/2021   Moderate episode of recurrent major depressive disorder ([place]) 12/20/2018   Iron deficiency anemia 12/19/2018   Type A blood, Rh positive 08/22/2017   S/P vaginal hysterectomy 07/31/2017   Recurrent vaginitis 01/10/2017   Hemorrhoids, external without complications 40/98/1191   Nausea in adult patient 02/24/2016   Endometriosis determined by laparoscopy 01/25/2016   Chronic constipation 08/14/2015   Bilateral ganglion cysts of wrists 08/14/2015   Mild mitral regurgitation 05/19/2015   B12 deficiency 05/18/2015   Asthma, moderate 05/18/2015   Anxiety 05/18/2015   Insomnia, persistent 05/18/2015   Eczema of hand 05/18/2015   Viral keratitis 05/18/2015   Migraine without aura and without status migrainosus, not intractable 05/18/2015   Numerous moles 05/18/2015   Allergic rhinitis 05/18/2015   History of corneal transplant 05/18/2015   Gastroesophageal reflux disease without esophagitis 12/16/2009    Past Surgical History:  Procedure Laterality Date   COLONOSCOPY WITH PROPOFOL N/A 09/29/2017   Procedure: COLONOSCOPY WITH PROPOFOL;  Surgeon: Jonathon Bellows, MD;  Location: [facility] ENDOSCOPY;  Service: Gastroenterology;  Laterality: N/A;   CORNEAL TRANSPLANT Right 01/07/2013   ESOPHAGOGASTRODUODENOSCOPY (EGD) WITH PROPOFOL N/A 09/29/2017   Procedure: ESOPHAGOGASTRODUODENOSCOPY (EGD) WITH PROPOFOL;  Surgeon: Jonathon Bellows, MD;  Location: [facility] ENDOSCOPY;  Service: Gastroenterology;  Laterality: N/A;   EYE SURGERY     eyelid surgery Right June 19, 2015   [facility] by Dr. Norlene Duel   LAPAROSCOPIC BILATERAL SALPINGECTOMY N/A 01/25/2016   Procedure: LAPAROSCOPIC BILATERAL SALPINGECTOMY, ADHESIOLYSIS, LAPAROSCOPIC EXCISION/FULGERATION ENDOMETRIOSIS ;  Surgeon: [name], MD;  Location: [facility] ORS;  Service: Gynecology;  Laterality: N/A;   Laparoscopic excision and fulguration of endometriosis N/A    bladder flap and uterosacral ligament endometriosis, excised and cauterized   UPPER GI ENDOSCOPY  07/19/2012   VAGINAL HYSTERECTOMY N/A 07/31/2017   Procedure: HYSTERECTOMY VAGINAL;  Surgeon: [name], MD;  Location: [facility] ORS;  Service: Gynecology;  Laterality: N/A;  Family History  Adopted: Yes  Problem Relation Age of Onset   Hyperlipidemia Mother    Hypertension Mother    Diabetes Father    Heart failure Father 16       hear attack   Cancer Neg Hx    Breast cancer Neg Hx     Social History   Socioeconomic History   Marital status: Single    Spouse name: Not on file   Number of children: 0   Years of education: 12   Highest education level: High school graduate  Occupational History   Occupation: textile  Tobacco Use   Smoking status: Never   Smokeless tobacco: Never  Vaping Use   Vaping Use: Never used  Substance and Sexual Activity   Alcohol use: Not Currently    Alcohol/week: 0.0 standard drinks of alcohol    Comment: occ. wine   Drug use: No   Sexual activity: Yes    Partners: Male    Birth control/protection: None, Surgical  Other Topics Concern   Not on file  Social History Narrative   Engaged to get married to her boyfriend of 14 years.   Social Determinants of Health   Financial Resource Strain: Low Risk  (01/06/2023)   Overall Financial Resource Strain (CARDIA)    Difficulty of Paying Living Expenses: Not hard at all  Food Insecurity: No Food Insecurity (01/06/2023)   Hunger Vital Sign    Worried About Running Out of Food in the Last Year: Never true    Ran Out of Food in the Last Year:  Never true  Transportation Needs: No Transportation Needs (01/06/2023)   PRAPARE - Hydrologist (Medical): No    Lack of Transportation (Non-Medical): No  Physical Activity: Insufficiently Active (01/06/2023)   Exercise Vital Sign    Days of Exercise per Week: 1 day    Minutes of Exercise per Session: 10 min  Stress: Stress Concern Present (01/06/2023)   [place]    Feeling of Stress : Rather much  Social Connections: Moderately Integrated (01/06/2023)   Social Connection and Isolation Panel [NHANES]    Frequency of Communication with Friends and Family: More than three times a week    Frequency of Social Gatherings with Friends and Family: Twice a week    Attends Religious Services: More than 4 times per year    Active Member of [employer] or Organizations: No    Attends Archivist Meetings: Never    Marital Status: Living with partner  Intimate Partner Violence: Not At Risk (01/06/2023)   Humiliation, Afraid, Rape, and Kick questionnaire    Fear of Current or Ex-Partner: No    Emotionally Abused: No    Physically Abused: No    Sexually Abused: No     Current Outpatient Medications:    albuterol (VENTOLIN HFA) 108 (90 Base) MCG/ACT inhaler, Inhale 2 puffs into the lungs every 6 (six) hours as needed for wheezing or shortness of breath., Disp: 18 g, Rfl: 0   carboxymethylcellul-glycerin (REFRESH OPTIVE) 0.5-0.9 % ophthalmic solution, Frequency:PHARMDIR   Dosage:0.0     Instructions:  Note:6 x OU Dose: 0.5%-0.9%, Disp: , Rfl:    conjugated estrogens (PREMARIN) vaginal cream, Place vaginally daily. For the first 2 weeks after that a few times a week - pea size on urethra and vaginal entry only, Disp: 42.5 g, Rfl: 5   cyclobenzaprine (FLEXERIL) 5 MG tablet, Take 1-2 tablets (5-10 mg  total) by mouth 3 (three) times daily as needed for muscle spasms. Take mainly at bedtime as may make drowsy,  do not take before driving., Disp: 90 tablet, Rfl: 0   dexlansoprazole (DEXILANT) 60 MG capsule, Take 1 capsule (60 mg total) by mouth daily., Disp: 90 capsule, Rfl: 3   Estradiol (IMVEXXY MAINTENANCE PACK) 10 MCG INST, 1 tablet per vagina 2 times per week at bedtime, Disp: 72 each, Rfl: 5   fluconazole (DIFLUCAN) 150 MG tablet, Take 1 tablet (150 mg total) by mouth every other day., Disp: 3 tablet, Rfl: 0   fluticasone-salmeterol (ADVAIR) 100-50 MCG/ACT AEPB, Inhale 1 puff into the lungs 2 (two) times daily., Disp: 1 each, Rfl: 5   hydrOXYzine (ATARAX) 10 MG tablet, Take 1-2 tablets (10-20 mg total) by mouth 3 (three) times daily as needed., Disp: 90 tablet, Rfl: 0   ibuprofen (ADVIL) 800 MG tablet, Take 1 tablet (800 mg total) by mouth every 8 (eight) hours as needed., Disp: 30 tablet, Rfl: 0   metroNIDAZOLE (FLAGYL) 500 MG tablet, Take 1 tablet (500 mg total) by mouth 2 (two) times daily., Disp: 14 tablet, Rfl: 0   montelukast (SINGULAIR) 10 MG tablet, Take 1 tablet (10 mg total) by mouth at bedtime., Disp: 90 tablet, Rfl: 1   MULTIPLE VITAMIN PO, Take 1 tablet by mouth daily., Disp: , Rfl:    Plecanatide 3 MG TABS, Take 1 tablet by mouth daily., Disp: 90 tablet, Rfl: 3   prednisoLONE acetate (PRED FORTE) 1 % ophthalmic suspension, Place 1 drop into the right eye daily., Disp: , Rfl:    predniSONE (STERAPRED UNI-PAK 21 TAB) 10 MG (21) TBPK tablet, Take per package directions., Disp: 21 tablet, Rfl: 0   promethazine (PHENERGAN) 12.5 MG tablet, Take 1 tablet (12.5 mg total) by mouth every 8 (eight) hours as needed for nausea or vomiting., Disp: 20 tablet, Rfl: 0   rizatriptan (MAXALT) 10 MG tablet, Take 1 tablet (10 mg total) by mouth 2 (two) times daily as needed., Disp: 10 tablet, Rfl: 1   triamcinolone (KENALOG) 0.1 %, Apply 1 application topically 2 (two) times daily as needed., Disp: 80 g, Rfl: 0   Ubrogepant (UBRELVY) 100 MG TABS, Take 1 tablet by mouth every other day., Disp: 16 tablet, Rfl:  5   valACYclovir (VALTREX) 500 MG tablet, Take 1 tablet by mouth 2 (two) times daily., Disp: , Rfl: 0   cetirizine (ZYRTEC) 5 MG tablet, Take by mouth., Disp: , Rfl:   Allergies  Allergen Reactions   Bactrim [Sulfamethoxazole-Trimethoprim] Nausea Only   Penicillins Nausea Only   Topamax [Topiramate] Other (See Comments)    Fatigue      ROS  Constitutional: Negative for fever , positive for weight change.  Respiratory: Negative for cough and shortness of breath.   Cardiovascular: Negative for chest pain or palpitations.  Gastrointestinal: Negative for abdominal pain, no bowel changes.  Musculoskeletal: Negative for gait problem or joint swelling.  Skin: Negative for rash.  Neurological: Negative for dizziness or headache.  No other specific complaints in a complete review of systems (except as listed in HPI above).   Objective  Vitals:   01/06/23 0754  BP: 116/64  Pulse: 75  Resp: 16  SpO2: 100%  Weight: 118 lb (53.5 kg)  Height: '5\' 5"'$  (1.651 m)    Body mass index is 19.64 kg/m.  Physical Exam  Constitutional: Patient appears well-developed and well-nourished. No distress.  HENT: Head: Normocephalic and atraumatic. Ears: B TMs ok, no erythema  or effusion; Nose: Nose normal. Mouth/Throat: Oropharynx is clear and moist. No oropharyngeal exudate.  Eyes: Conjunctivae and EOM are normal. Pupils are equal, round, and reactive to light. No scleral icterus.  Neck: Normal range of motion. Neck supple. No JVD present. No thyromegaly present.  Cardiovascular: Normal rate, regular rhythm and normal heart sounds.  No murmur heard. No BLE edema. Pulmonary/Chest: Effort normal and breath sounds normal. No respiratory distress. Abdominal: Soft. Bowel sounds are normal, no distension. There is no tenderness. no masses Breast: no lumps or masses, no nipple discharge or rashes FEMALE GENITALIA:  Not done  RECTAL: not done  Musculoskeletal: Normal range of motion, no joint effusions.  No gross deformities Neurological: he is alert and oriented to person, place, and time. No cranial nerve deficit. Coordination, balance, strength, speech and gait are normal.  Skin: Skin is warm and dry. No rash noted. No erythema.  Psychiatric: Patient has a normal mood and affect. behavior is normal. Judgment and thought content normal.   Recent Results (from the past 2160 hour(s))  Cervicovaginal ancillary only     Status: Abnormal   Collection Time: 11/28/22  8:48 AM  Result Value Ref Range   Bacterial Vaginitis (gardnerella) Positive (A)    Candida Vaginitis Negative    Candida Glabrata Negative    Comment Normal Reference Range Candida Species - Negative    Comment Normal Reference Range Candida Galbrata - Negative    Comment      Normal Reference Range Bacterial Vaginosis - Negative     Fall Risk:    01/06/2023    7:52 AM 10/17/2022   11:32 AM 09/30/2022    2:46 PM 07/01/2022    1:36 PM 06/13/2022    1:07 PM  [place] in the past year? 0 0 0 0 0  Number falls in past yr: 0  0  0  Injury with Fall? 0  0  0  Risk for fall due to : No Fall Risks No Fall Risks No Fall Risks No Fall Risks No Fall Risks  Follow up Falls prevention discussed Falls prevention discussed Falls prevention discussed Falls prevention discussed Falls prevention discussed;Education provided     Functional Status Survey: Is the patient deaf or have difficulty hearing?: No Does the patient have difficulty seeing, even when wearing glasses/contacts?: No Does the patient have difficulty concentrating, remembering, or making decisions?: No Does the patient have difficulty walking or climbing stairs?: No Does the patient have difficulty dressing or bathing?: No Does the patient have difficulty doing errands alone such as visiting a doctor's office or shopping?: No   Assessment & Plan  1. Well adult exam  - DG Bone Density; Future - Lipid panel - COMPLETE METABOLIC PANEL WITH GFR  2.  Osteoporosis screening  - DG Bone Density; Future  3. Weight loss  - C-reactive protein - TSH - Sedimentation rate  4. B12 deficiency  - B12 and Folate Panel  5. Lipid screening  - Lipid panel  6. Nausea   7. History of iron deficiency anemia  - CBC with Differential/Platelet - Iron, TIBC and Ferritin Panel   -USPSTF grade A and B recommendations reviewed with patient; age-appropriate recommendations, preventive care, screening tests, etc discussed and encouraged; healthy living encouraged; see AVS for patient education given to patient -Discussed importance of 150 minutes of physical activity weekly, eat two servings of fish weekly, eat one serving of tree nuts ( cashews, pistachios, pecans, almonds.Marland Kitchen) every other day, eat 6  servings of fruit/vegetables daily and drink plenty of water and avoid sweet beverages.   -Reviewed Health Maintenance: Yes.

## 2023-01-06 ENCOUNTER — Ambulatory Visit (INDEPENDENT_AMBULATORY_CARE_PROVIDER_SITE_OTHER): Payer: 59 | Admitting: Family Medicine

## 2023-01-06 ENCOUNTER — Encounter: Payer: Self-pay | Admitting: Family Medicine

## 2023-01-06 VITALS — BP 116/64 | HR 75 | Resp 16 | Ht 65.0 in | Wt 118.0 lb

## 2023-01-06 DIAGNOSIS — Z862 Personal history of diseases of the blood and blood-forming organs and certain disorders involving the immune mechanism: Secondary | ICD-10-CM

## 2023-01-06 DIAGNOSIS — Z0000 Encounter for general adult medical examination without abnormal findings: Secondary | ICD-10-CM

## 2023-01-06 DIAGNOSIS — Z13820 Encounter for screening for osteoporosis: Secondary | ICD-10-CM

## 2023-01-06 DIAGNOSIS — Z13220 Encounter for screening for lipoid disorders: Secondary | ICD-10-CM

## 2023-01-06 DIAGNOSIS — R634 Abnormal weight loss: Secondary | ICD-10-CM

## 2023-01-06 DIAGNOSIS — E538 Deficiency of other specified B group vitamins: Secondary | ICD-10-CM | POA: Diagnosis not present

## 2023-01-06 DIAGNOSIS — R110 Nausea: Secondary | ICD-10-CM

## 2023-01-07 LAB — LIPID PANEL
Cholesterol: 204 mg/dL — ABNORMAL HIGH (ref <200)
HDL: 70 mg/dL (ref >=50)
LDL Cholesterol (Calc): 118 mg/dL (calc) — ABNORMAL HIGH
Non-HDL Cholesterol (Calc): 134 mg/dL (calc) — ABNORMAL HIGH (ref <130)
Total CHOL/HDL Ratio: 2.9 (calc) (ref <5.0)
Triglycerides: 67 mg/dL (ref <150)

## 2023-01-07 LAB — COMPLETE METABOLIC PANEL WITH GFR
AG Ratio: 1.7 (calc) (ref 1.0–2.5)
ALT: 10 U/L (ref 6–29)
AST: 15 U/L (ref 10–35)
Albumin: 4.4 g/dL (ref 3.6–5.1)
Alkaline phosphatase (APISO): 50 U/L (ref 37–153)
BUN: 13 mg/dL (ref 7–25)
CO2: 31 mmol/L (ref 20–32)
Calcium: 9.7 mg/dL (ref 8.6–10.4)
Chloride: 104 mmol/L (ref 98–110)
Creat: 0.71 mg/dL (ref 0.50–1.03)
Globulin: 2.6 g/dL (calc) (ref 1.9–3.7)
Glucose, Bld: 89 mg/dL (ref 65–99)
Potassium: 4.4 mmol/L (ref 3.5–5.3)
Sodium: 140 mmol/L (ref 135–146)
Total Bilirubin: 1.1 mg/dL (ref 0.2–1.2)
Total Protein: 7 g/dL (ref 6.1–8.1)
eGFR: 102 mL/min/{1.73_m2} (ref >=60)

## 2023-01-07 LAB — B12 AND FOLATE PANEL
Folate: 18.7 ng/mL
Vitamin B-12: 362 pg/mL (ref 200–1100)

## 2023-01-07 LAB — CBC WITH DIFFERENTIAL/PLATELET
Absolute Monocytes: 355 cells/uL (ref 200–950)
Basophils Absolute: 48 cells/uL (ref 0–200)
Basophils Relative: 0.9 %
Eosinophils Absolute: 127 cells/uL (ref 15–500)
Eosinophils Relative: 2.4 %
HCT: 34.9 % — ABNORMAL LOW (ref 35.0–45.0)
Hemoglobin: 11.7 g/dL (ref 11.7–15.5)
Lymphs Abs: 1935 cells/uL (ref 850–3900)
MCH: 30.1 pg (ref 27.0–33.0)
MCHC: 33.5 g/dL (ref 32.0–36.0)
MCV: 89.7 fL (ref 80.0–100.0)
MPV: 12.1 fL (ref 7.5–12.5)
Monocytes Relative: 6.7 %
Neutro Abs: 2836 cells/uL (ref 1500–7800)
Neutrophils Relative %: 53.5 %
Platelets: 204 10*3/uL (ref 140–400)
RBC: 3.89 10*6/uL (ref 3.80–5.10)
RDW: 12 % (ref 11.0–15.0)
Total Lymphocyte: 36.5 %
WBC: 5.3 10*3/uL (ref 3.8–10.8)

## 2023-01-07 LAB — IRON,TIBC AND FERRITIN PANEL
%SAT: 31 % (calc) (ref 16–45)
Ferritin: 181 ng/mL (ref 16–232)
Iron: 95 ug/dL (ref 45–160)
TIBC: 308 mcg/dL (calc) (ref 250–450)

## 2023-01-07 LAB — C-REACTIVE PROTEIN: CRP: 0.2 mg/L (ref <8.0)

## 2023-01-07 LAB — TSH: TSH: 1.03 mIU/L

## 2023-01-07 LAB — SEDIMENTATION RATE: Sed Rate: 9 mm/h (ref 0–30)

## 2023-01-13 ENCOUNTER — Ambulatory Visit: Payer: 59 | Admitting: Anesthesiology

## 2023-01-13 ENCOUNTER — Encounter
Admission: RE | Disposition: A | Discharge status: Home / Self Care | Payer: Self-pay | Source: Home / Self Care | Attending: Gastroenterology

## 2023-01-13 ENCOUNTER — Ambulatory Visit
Admission: RE | Admit: 2023-01-13 | Discharge: 2023-01-13 | Disposition: A | Discharge status: Home / Self Care | Payer: 59 | Attending: Gastroenterology | Admitting: Gastroenterology

## 2023-01-13 ENCOUNTER — Encounter: Payer: Self-pay | Admitting: Gastroenterology

## 2023-01-13 DIAGNOSIS — B3781 Candidal esophagitis: Secondary | ICD-10-CM | POA: Diagnosis not present

## 2023-01-13 DIAGNOSIS — K219 Gastro-esophageal reflux disease without esophagitis: Secondary | ICD-10-CM | POA: Diagnosis not present

## 2023-01-13 DIAGNOSIS — R1013 Epigastric pain: Secondary | ICD-10-CM

## 2023-01-13 DIAGNOSIS — J45909 Unspecified asthma, uncomplicated: Secondary | ICD-10-CM | POA: Diagnosis not present

## 2023-01-13 DIAGNOSIS — R1310 Dysphagia, unspecified: Secondary | ICD-10-CM | POA: Diagnosis not present

## 2023-01-13 DIAGNOSIS — K229 Disease of esophagus, unspecified: Secondary | ICD-10-CM | POA: Insufficient documentation

## 2023-01-13 DIAGNOSIS — R140 Abdominal distension (gaseous): Secondary | ICD-10-CM

## 2023-01-13 HISTORY — PX: ESOPHAGOGASTRODUODENOSCOPY (EGD) WITH PROPOFOL: SHX5813

## 2023-01-13 LAB — KOH PREP

## 2023-01-13 SURGERY — ESOPHAGOGASTRODUODENOSCOPY (EGD) WITH PROPOFOL
Anesthesia: General

## 2023-01-13 MED ORDER — PROPOFOL 1000 MG/100ML IV EMUL
INTRAVENOUS | Status: AC
  Filled 2023-01-13: qty 100

## 2023-01-13 MED ORDER — GLYCOPYRROLATE 0.2 MG/ML IJ SOLN
INTRAMUSCULAR | Status: DC | PRN
  Administered 2023-01-13: .2 mg via INTRAVENOUS

## 2023-01-13 MED ORDER — LIDOCAINE HCL (CARDIAC) PF 100 MG/5ML IV SOSY
PREFILLED_SYRINGE | INTRAVENOUS | Status: DC | PRN
  Administered 2023-01-13: 40 mg via INTRAVENOUS

## 2023-01-13 MED ORDER — LIDOCAINE HCL (PF) 2 % IJ SOLN
INTRAMUSCULAR | Status: AC
  Filled 2023-01-13: qty 5

## 2023-01-13 MED ORDER — GLYCOPYRROLATE 0.2 MG/ML IJ SOLN
INTRAMUSCULAR | Status: AC
  Filled 2023-01-13: qty 1

## 2023-01-13 MED ORDER — DEXMEDETOMIDINE HCL IN NACL 200 MCG/50ML IV SOLN
INTRAVENOUS | Status: DC | PRN
  Administered 2023-01-13: 8 ug via INTRAVENOUS

## 2023-01-13 MED ORDER — SODIUM CHLORIDE 0.9 % IV SOLN
INTRAVENOUS | Status: DC
  Administered 2023-01-13: 20 mL/h via INTRAVENOUS

## 2023-01-13 MED ORDER — PROPOFOL 10 MG/ML IV BOLUS
INTRAVENOUS | Status: DC | PRN
  Administered 2023-01-13: 80 mg via INTRAVENOUS
  Administered 2023-01-13: 20 mg via INTRAVENOUS

## 2023-01-13 MED ORDER — PROPOFOL 500 MG/50ML IV EMUL
INTRAVENOUS | Status: DC | PRN
  Administered 2023-01-13: 175 ug/kg/min via INTRAVENOUS

## 2023-01-13 NOTE — Transfer of Care (Addendum)
Immediate Anesthesia Transfer of Care Note  Patient: Jessica Carroll  Procedure(s) Performed: Procedure(s): ESOPHAGOGASTRODUODENOSCOPY (EGD) WITH PROPOFOL (N/A)  Patient Location: PACU and Endoscopy Unit  Anesthesia Type:General  Level of Consciousness: sedated  Airway & Oxygen Therapy: Patient Spontanous Breathing and Patient connected to nasal cannula oxygen  Post-op Assessment: Report given to RN and Post -op Vital signs reviewed and stable  Post vital signs: Reviewed and stable  Last Vitals:  Vitals:   01/13/23 0719 01/13/23 0845  BP: (!) 110/56 (!) 105/57  Pulse: 63 62  Resp: 20 11  Temp: (!) 36.1 C   SpO2: 638% 177%    Complications: No apparent anesthesia complications

## 2023-01-13 NOTE — Progress Notes (Signed)
Some nausea in recovery. Said she gets it with anesthesia but forgot to tell them. Relief with drinking sips of ginger ale.

## 2023-01-13 NOTE — Brief Op Note (Signed)
Esophageal brushing sent to lab, evaluate for KOH/Candida

## 2023-01-13 NOTE — Anesthesia Preprocedure Evaluation (Signed)
Anesthesia Evaluation  Patient identified by MRN, date of birth, ID band Patient awake    Reviewed: Allergy & Precautions, H&P , NPO status , Patient's Chart, lab work & pertinent test results, reviewed documented beta blocker date and time   History of Anesthesia Complications (+) PONV and history of anesthetic complications  Airway Mallampati: II  TM Distance: >3 FB Neck ROM: full    Dental  (+) Teeth Intact, Dental Advidsory Given   Pulmonary neg shortness of breath, asthma , neg sleep apnea, neg recent URI          Cardiovascular negative cardio ROS      Neuro/Psych  Headaches, neg Seizures PSYCHIATRIC DISORDERS (Depression) Anxiety Depression       GI/Hepatic Neg liver ROS,GERD  ,,  Endo/Other  negative endocrine ROS    Renal/GU negative Renal ROS  negative genitourinary   Musculoskeletal   Abdominal   Peds  Hematology  (+) Blood dyscrasia, anemia   Anesthesia Other Findings Past Medical History:   Diffuse cystic mastopathy                                    Asthma                                          2011         GERD (gastroesophageal reflux disease)                       Anxiety                                                      Headache                                                   Past Surgical History:   UPPER GI ENDOSCOPY                               07/19/2012   CORNEAL TRANSPLANT                              Right 01/07/2013    eyelid surgery                                  Right July 8, 2*     Comment:[place] by Dr. Norlene Duel BMI    Body Mass Index   20.63 kg/m 2     Reproductive/Obstetrics negative OB ROS                             Anesthesia Physical Anesthesia Plan  ASA: 2  Anesthesia Plan: General   Post-op Pain Management:    Induction: Intravenous  PONV Risk Score and Plan: 4 or greater and Propofol  infusion and TIVA  Airway Management  Planned: Nasal Cannula and Natural Airway  Additional Equipment:   Intra-op Plan:   Post-operative Plan:   Informed Consent: I have reviewed the patients History and Physical, chart, labs and discussed the procedure including the risks, benefits and alternatives for the proposed anesthesia with the patient or authorized representative who has indicated his/her understanding and acceptance.     Dental Advisory Given  Plan Discussed with: CRNA  Anesthesia Plan Comments:         Anesthesia Quick Evaluation

## 2023-01-13 NOTE — Op Note (Signed)
[facility] Gastroenterology Patient Name: Jessica Carroll Procedure Date: 01/13/2023 8:18 AM MRN: [id number] Account #: [id number] Date of Birth: October 22, 1971 Admit Type: Outpatient Age: 52 Room: [facility] ENDO ROOM 4 Gender: Female Note Status: Finalized Instrument Name: Altamese Cabal Endoscope [id number] Procedure:             Upper GI endoscopy Indications:           Dysphagia Providers:             Jonathon Bellows MD, MD Medicines:             Propofol per Anesthesia Complications:         No immediate complications. Procedure:             Pre-Anesthesia Assessment:                        - Prior to the procedure, a History and Physical was                         performed, and patient medications, allergies and                         sensitivities were reviewed. The patient's tolerance                         of previous anesthesia was reviewed.                        - The risks and benefits of the procedure and the                         sedation options and risks were discussed with the                         patient. All questions were answered and informed                         consent was obtained.                        - ASA Grade Assessment: II - A patient with mild                         systemic disease.                        After obtaining informed consent, the endoscope was                         passed under direct vision. Throughout the procedure,                         the patient's blood pressure, pulse, and oxygen                         saturations were monitored continuously. The Endoscope                         was introduced through the mouth, and advanced to the  third part of duodenum. The upper GI endoscopy was                         accomplished with ease. The patient tolerated the                         procedure well. Findings:      The stomach was normal.      The examined duodenum was normal.      The cardia and  gastric fundus were normal on retroflexion.      Diffuse, white plaques were found in the entire esophagus. Biopsies were       taken with a cold forceps for histology. Brushings for KOH prep were       obtained in the entire esophagus. Impression:            - Normal stomach.                        - Normal examined duodenum.                        - Esophageal plaques were found, suspicious for                         candidiasis. Biopsied. Brushings performed. Recommendation:        - Discharge patient to home (with escort).                        - Resume previous diet.                        - Continue present medications.                        - Await pathology results.                        - Return to my office as previously scheduled. Procedure Code(s):     --- Professional ---                        [phone], Esophagogastroduodenoscopy, flexible,                         transoral; with biopsy, single or multiple Diagnosis Code(s):     --- Professional ---                        K22.9, Disease of esophagus, unspecified                        R13.10, Dysphagia, unspecified CPT copyright 2022 American Medical Association. All rights reserved. The codes documented in this report are preliminary and upon coder review may  be revised to meet current compliance requirements. Jonathon Bellows, MD Jonathon Bellows MD, MD 01/13/2023 8:38:44 AM This report has been signed electronically. Number of Addenda: 0 Note Initiated On: 01/13/2023 8:18 AM Estimated Blood Loss:  Estimated blood loss: none.      [facility]

## 2023-01-13 NOTE — Anesthesia Procedure Notes (Signed)
Date/Time: 01/13/2023 8:30 AM  Performed by: Doreen Salvage, CRNAPre-anesthesia Checklist: Patient identified, Emergency Drugs available, Suction available and Patient being monitored Patient Re-evaluated:Patient Re-evaluated prior to induction Oxygen Delivery Method: Nasal cannula Induction Type: IV induction Dental Injury: Teeth and Oropharynx as per pre-operative assessment  Comments: Nasal cannula with etCO2 monitoring

## 2023-01-13 NOTE — H&P (Signed)
Jessica Bellows, MD [address] [place], [place], [place], [address] [address], [place], [place], [place], [address] Phone: [phone]  Fax: [phone]  Primary Care Physician:  Steele Sizer, MD   Pre-Procedure History & Physical: HPI:  Jessica Carroll is a 52 y.o. female is here for an endoscopy    Past Medical History:  Diagnosis Date   Anxiety    Asthma 2011   Depression    Diffuse cystic mastopathy    Endometriosis    GERD (gastroesophageal reflux disease)    Headache     Past Surgical History:  Procedure Laterality Date   COLONOSCOPY WITH PROPOFOL N/A 09/29/2017   Procedure: COLONOSCOPY WITH PROPOFOL;  Surgeon: Jessica Bellows, MD;  Location: [facility] ENDOSCOPY;  Service: Gastroenterology;  Laterality: N/A;   CORNEAL TRANSPLANT Right 01/07/2013   ESOPHAGOGASTRODUODENOSCOPY (EGD) WITH PROPOFOL N/A 09/29/2017   Procedure: ESOPHAGOGASTRODUODENOSCOPY (EGD) WITH PROPOFOL;  Surgeon: Jessica Bellows, MD;  Location: [facility] ENDOSCOPY;  Service: Gastroenterology;  Laterality: N/A;   EYE SURGERY     eyelid surgery Right June 19, 2015   [facility] by Dr. Norlene Duel   LAPAROSCOPIC BILATERAL SALPINGECTOMY N/A 01/25/2016   Procedure: LAPAROSCOPIC BILATERAL SALPINGECTOMY, ADHESIOLYSIS, LAPAROSCOPIC EXCISION/FULGERATION ENDOMETRIOSIS ;  Surgeon: [name], MD;  Location: [facility] ORS;  Service: Gynecology;  Laterality: N/A;   Laparoscopic excision and fulguration of endometriosis N/A    bladder flap and uterosacral ligament endometriosis, excised and cauterized   UPPER GI ENDOSCOPY  07/19/2012   VAGINAL HYSTERECTOMY N/A 07/31/2017   Procedure: HYSTERECTOMY VAGINAL;  Surgeon: [name], MD;  Location: [facility] ORS;  Service: Gynecology;  Laterality: N/A;    Prior to Admission medications   Medication Sig Start Date End Date Taking? Authorizing Provider  albuterol (VENTOLIN HFA) 108 (90 Base) MCG/ACT inhaler Inhale 2 puffs into the lungs every 6 (six) hours as needed  for wheezing or shortness of breath. 11/21/22  Yes Sowles, Drue Stager, MD  carboxymethylcellul-glycerin (REFRESH OPTIVE) 0.5-0.9 % ophthalmic solution Frequency:PHARMDIR   Dosage:0.0     Instructions:  Note:6 x OU Dose: 0.5%-0.9% 10/01/12  Yes [provider]  conjugated estrogens (PREMARIN) vaginal cream Place vaginally daily. For the first 2 weeks after that a few times a week - pea size on urethra and vaginal entry only 09/30/22  Yes Sowles, Drue Stager, MD  cyclobenzaprine (FLEXERIL) 5 MG tablet Take 1-2 tablets (5-10 mg total) by mouth 3 (three) times daily as needed for muscle spasms. Take mainly at bedtime as may make drowsy, do not take before driving. 09/30/22  Yes Sowles, Drue Stager, MD  dexlansoprazole (DEXILANT) 60 MG capsule Take 1 capsule (60 mg total) by mouth daily. 08/25/22  Yes Jessica Bellows, MD  Estradiol [facility] MAINTENANCE PACK) 10 MCG INST 1 tablet per vagina 2 times per week at bedtime 11/28/22  Yes Dove, Myra C, MD  fluconazole (DIFLUCAN) 150 MG tablet Take 1 tablet (150 mg total) by mouth every other day. 09/30/22  Yes Sowles, Drue Stager, MD  fluticasone-salmeterol (ADVAIR) 100-50 MCG/ACT AEPB Inhale 1 puff into the lungs 2 (two) times daily. 04/01/22  Yes Sowles, Drue Stager, MD  hydrOXYzine (ATARAX) 10 MG tablet Take 1-2 tablets (10-20 mg total) by mouth 3 (three) times daily as needed. 09/30/22  Yes Sowles, Drue Stager, MD  ibuprofen (ADVIL) 800 MG tablet Take 1 tablet (800 mg total) by mouth every 8 (eight) hours as needed. 05/03/22  Yes Sowles, Drue Stager, MD  metroNIDAZOLE (FLAGYL) 500 MG tablet Take 1 tablet (500 mg total) by mouth  2 (two) times daily. 11/28/22  Yes Dove, Myra C, MD  montelukast (SINGULAIR) 10 MG tablet Take 1 tablet (10 mg total) by mouth at bedtime. 09/30/22  Yes Sowles, Drue Stager, MD  MULTIPLE VITAMIN PO Take 1 tablet by mouth daily.   Yes [provider]  Plecanatide 3 MG TABS Take 1 tablet by mouth daily. 05/12/22 05/07/23 Yes Jessica Bellows, MD  prednisoLONE acetate  (PRED FORTE) 1 % ophthalmic suspension Place 1 drop into the right eye daily. 01/19/17  Yes [provider]  predniSONE (STERAPRED UNI-PAK 21 TAB) 10 MG (21) TBPK tablet Take per package directions. 10/17/22  Yes Myles Gip, DO  promethazine (PHENERGAN) 12.5 MG tablet Take 1 tablet (12.5 mg total) by mouth every 8 (eight) hours as needed for nausea or vomiting. 12/25/20  Yes Sowles, Drue Stager, MD  rizatriptan (MAXALT) 10 MG tablet Take 1 tablet (10 mg total) by mouth 2 (two) times daily as needed. 07/01/22  Yes Sowles, Drue Stager, MD  triamcinolone (KENALOG) 0.1 % Apply 1 application topically 2 (two) times daily as needed. 12/25/20  Yes Sowles, Drue Stager, MD  Ubrogepant (UBRELVY) 100 MG TABS Take 1 tablet by mouth every other day. 12/28/22  Yes Sowles, Drue Stager, MD  valACYclovir (VALTREX) 500 MG tablet Take 1 tablet by mouth 2 (two) times daily. 11/30/15  Yes Dionne Bucy, MD  cetirizine (ZYRTEC) 5 MG tablet Take by mouth. 03/15/21 08/25/22  [provider]    Allergies as of 08/26/2022 - Review Complete 08/25/2022  Allergen Reaction Noted   Bactrim [sulfamethoxazole-trimethoprim] Nausea Only 09/25/2018   Penicillins Nausea Only 03/31/2015   Topamax [topiramate] Other (See Comments) 10/04/2021    Family History  Adopted: Yes  Problem Relation Age of Onset   Hyperlipidemia Mother    Hypertension Mother    Diabetes Father    Heart failure Father 75       hear attack   Cancer Neg Hx    Breast cancer Neg Hx     Social History   Socioeconomic History   Marital status: Single    Spouse name: Not on file   Number of children: 0   Years of education: 12   Highest education level: High school graduate  Occupational History   Occupation: textile  Tobacco Use   Smoking status: Never   Smokeless tobacco: Never  Vaping Use   Vaping Use: Never used  Substance and Sexual Activity   Alcohol use: Not Currently    Alcohol/week: 0.0 standard drinks of alcohol    Comment: occ.  wine   Drug use: No   Sexual activity: Yes    Partners: Male    Birth control/protection: None, Surgical  Other Topics Concern   Not on file  Social History Narrative   Engaged to get married to her boyfriend of 14 years.   Social Determinants of Health   Financial Resource Strain: Low Risk  (01/06/2023)   Overall Financial Resource Strain (CARDIA)    Difficulty of Paying Living Expenses: Not hard at all  Food Insecurity: No Food Insecurity (01/06/2023)   Hunger Vital Sign    Worried About Running Out of Food in the Last Year: Never true    Ran Out of Food in the Last Year: Never true  Transportation Needs: No Transportation Needs (01/06/2023)   PRAPARE - Hydrologist (Medical): No    Lack of Transportation (Non-Medical): No  Physical Activity: Insufficiently Active (01/06/2023)   Exercise Vital Sign    Days  of Exercise per Week: 1 day    Minutes of Exercise per Session: 10 min  Stress: Stress Concern Present (01/06/2023)   [place]    Feeling of Stress : Rather much  Social Connections: Moderately Integrated (01/06/2023)   Social Connection and Isolation Panel [NHANES]    Frequency of Communication with Friends and Family: More than three times a week    Frequency of Social Gatherings with Friends and Family: Twice a week    Attends Religious Services: More than 4 times per year    Active Member of [employer] or Organizations: No    Attends Archivist Meetings: Never    Marital Status: Living with partner  Intimate Partner Violence: Not At Risk (01/06/2023)   Humiliation, Afraid, Rape, and Kick questionnaire    Fear of Current or Ex-Partner: No    Emotionally Abused: No    Physically Abused: No    Sexually Abused: No    Review of Systems: See HPI, otherwise negative ROS  Physical Exam: BP (!) 110/56   Pulse 63   Temp (!) 96.9 F (36.1 C) (Temporal)   Resp 20   Ht '5\' 5"'$   (1.651 m)   Wt 53.1 kg   LMP 07/11/2017 (Exact Date) Comment: patient still has her ovaries  SpO2 100%   BMI 19.47 kg/m  General:   Alert,  pleasant and cooperative in NAD Head:  Normocephalic and atraumatic. Neck:  Supple; no masses or thyromegaly. Lungs:  Clear throughout to auscultation, normal respiratory effort.    Heart:  +S1, +S2, Regular rate and rhythm, No edema. Abdomen:  Soft, nontender and nondistended. Normal bowel sounds, without guarding, and without rebound.   Neurologic:  Alert and  oriented x4;  grossly normal neurologically.  Impression/Plan: Jessica Carroll is here for an endoscopy  to be performed for  evaluation of dysphagia     Risks, benefits, limitations, and alternatives regarding endoscopy have been reviewed with the patient.  Questions have been answered.  All parties agreeable.   Jessica Bellows, MD  01/13/2023, 8:18 AM

## 2023-01-16 ENCOUNTER — Encounter: Payer: Self-pay | Admitting: Gastroenterology

## 2023-01-16 LAB — SURGICAL PATHOLOGY

## 2023-01-16 NOTE — Progress Notes (Signed)
Biopsies show yeast infection of the esophagus   1. Start on diflucan 200 mg daily for 14 days

## 2023-01-17 ENCOUNTER — Encounter: Payer: Self-pay | Admitting: Gastroenterology

## 2023-01-17 ENCOUNTER — Other Ambulatory Visit: Payer: Self-pay

## 2023-01-17 MED ORDER — FLUCONAZOLE 200 MG PO TABS: 200.0000 mg | ORAL_TABLET | Freq: Every day | ORAL | 0 refills | Status: DC

## 2023-01-24 ENCOUNTER — Ambulatory Visit (INDEPENDENT_AMBULATORY_CARE_PROVIDER_SITE_OTHER): Payer: 59

## 2023-01-24 DIAGNOSIS — E538 Deficiency of other specified B group vitamins: Secondary | ICD-10-CM

## 2023-01-24 MED ORDER — CYANOCOBALAMIN 1000 MCG/ML IJ SOLN
1000.0000 ug | Freq: Once | INTRAMUSCULAR | Status: AC
  Administered 2023-01-24: 1000 ug via INTRAMUSCULAR

## 2023-01-24 NOTE — Progress Notes (Signed)
Patient presented for B-12 injection per the advice of Dr. Ancil Boozer. Patient stated she was in good health and has had no prior adverse reactions when receiving the injection. Patient tolerated the injection well and had no immediate adverse reactions. Patient was educated on signs and symptoms of allergic reaction and she verbalized understanding for management should any arise.

## 2023-02-02 NOTE — Anesthesia Postprocedure Evaluation (Signed)
Anesthesia Post Note  Patient: MARCILLA STABER  Procedure(s) Performed: ESOPHAGOGASTRODUODENOSCOPY (EGD) WITH PROPOFOL  Patient location during evaluation: Endoscopy Anesthesia Type: General Level of consciousness: awake and alert Pain management: pain level controlled Vital Signs Assessment: post-procedure vital signs reviewed and stable Respiratory status: spontaneous breathing, nonlabored ventilation, respiratory function stable and patient connected to nasal cannula oxygen Cardiovascular status: blood pressure returned to baseline and stable Postop Assessment: no apparent nausea or vomiting Anesthetic complications: no   No notable events documented.   Last Vitals:  Vitals:   01/13/23 0855 01/13/23 0912  BP: 104/63 111/64  Pulse:  62  Resp:    Temp:    SpO2:  99%    Last Pain:  Vitals:   01/14/23 1210  TempSrc:   PainSc: 3                  Martha Clan

## 2023-02-16 ENCOUNTER — Other Ambulatory Visit: Payer: Self-pay | Admitting: Family Medicine

## 2023-02-16 DIAGNOSIS — G43009 Migraine without aura, not intractable, without status migrainosus: Secondary | ICD-10-CM

## 2023-02-16 MED ORDER — IBUPROFEN 800 MG PO TABS: 800.0000 mg | ORAL_TABLET | Freq: Three times a day (TID) | ORAL | 0 refills | Status: DC | PRN

## 2023-02-20 ENCOUNTER — Ambulatory Visit: Payer: 59 | Admitting: Internal Medicine

## 2023-02-20 ENCOUNTER — Other Ambulatory Visit (HOSPITAL_COMMUNITY)
Admission: RE | Admit: 2023-02-20 | Discharge: 2023-02-20 | Disposition: A | Discharge status: Home / Self Care | Payer: 59 | Source: Ambulatory Visit | Attending: Internal Medicine | Admitting: Internal Medicine

## 2023-02-20 ENCOUNTER — Encounter: Payer: Self-pay | Admitting: Internal Medicine

## 2023-02-20 VITALS — BP 122/70 | HR 98 | Temp 97.5°F | Resp 16 | Ht 65.0 in | Wt 122.8 lb

## 2023-02-20 DIAGNOSIS — N949 Unspecified condition associated with female genital organs and menstrual cycle: Secondary | ICD-10-CM | POA: Insufficient documentation

## 2023-02-20 DIAGNOSIS — N898 Other specified noninflammatory disorders of vagina: Secondary | ICD-10-CM | POA: Insufficient documentation

## 2023-02-20 LAB — POCT URINALYSIS DIPSTICK
Bilirubin, UA: NEGATIVE
Blood, UA: NEGATIVE
Glucose, UA: NEGATIVE
Ketones, UA: NEGATIVE
Leukocytes, UA: NEGATIVE
Nitrite, UA: NEGATIVE
Odor: NORMAL
Protein, UA: NEGATIVE
Spec Grav, UA: 1.01 (ref 1.010–1.025)
Urobilinogen, UA: 0.2 E.U./dL
pH, UA: 6.5 (ref 5.0–8.0)

## 2023-02-20 NOTE — Progress Notes (Signed)
   Acute Office Visit  Subjective:     Patient ID: Jessica Carroll, female    DOB: 1971/05/06, 52 y.o.   MRN: [id number]  Chief Complaint  Patient presents with   vaginal discomfort    HPI Patient is in today for vaginal discomfort.   VAGINAL DISCOMFORT Duration:  3 days No change in vaginal discharge Pruritus: yes Dysuria: yes Malodorous: no Urinary frequency: yes Fevers: no Abdominal pain: no - did have some sharp LLQ pain on Friday when the symptoms started Sexual activity: monogamous History of sexually transmitted diseases: no but history of recurrent BV, last treated in December with Flagyl  Recent antibiotic use: no Context: recurrent BV  Treatments attempted: none   Review of Systems  Constitutional:  Negative for chills and fever.  Genitourinary:  Positive for dysuria, frequency and urgency. Negative for flank pain and hematuria.        Objective:    BP 122/70   Pulse 98   Temp (!) 97.5 F (36.4 C)   Resp 16   Ht 5\' 5"  (1.651 m)   Wt 122 lb 12.8 oz (55.7 kg)   LMP 07/11/2017 (Exact Date) Comment: patient still has her ovaries  SpO2 99%   BMI 20.43 kg/m  BP Readings from Last 3 Encounters:  02/20/23 122/70  01/13/23 111/64  01/06/23 116/64   Wt Readings from Last 3 Encounters:  02/20/23 122 lb 12.8 oz (55.7 kg)  01/13/23 117 lb (53.1 kg)  01/06/23 118 lb (53.5 kg)      Physical Exam Constitutional:      Appearance: Normal appearance.  HENT:     Head: Normocephalic and atraumatic.  Eyes:     Conjunctiva/sclera: Conjunctivae normal.  Cardiovascular:     Rate and Rhythm: Normal rate and regular rhythm.  Pulmonary:     Effort: Pulmonary effort is normal.     Breath sounds: Normal breath sounds.  Skin:    General: Skin is warm and dry.  Neurological:     General: No focal deficit present.     Mental Status: She is alert. Mental status is at baseline.  Psychiatric:        Mood and Affect: Mood normal.        Behavior: Behavior  normal.     Results for orders placed or performed in visit on 02/20/23  POCT Urinalysis Dipstick  Result Value Ref Range   Color, UA yellow    Clarity, UA clear    Glucose, UA Negative Negative   Bilirubin, UA neg    Ketones, UA neg    Spec Grav, UA 1.010 1.010 - 1.025   Blood, UA neg    pH, UA 6.5 5.0 - 8.0   Protein, UA Negative Negative   Urobilinogen, UA 0.2 0.2 or 1.0 E.U./dL   Nitrite, UA neg    Leukocytes, UA Negative Negative   Appearance clear    Odor normal         Assessment & Plan:   1. Vaginal burning: UA negative for UTI, vaginal swam pending. Does have a history of recurrent BV, discussed boric acid once this infection is identified and treated to help try to prevent recurrence. Work note for the day given.  - Cervicovaginal ancillary only - POCT Urinalysis Dipstick   Return if symptoms worsen or fail to improve.  Teodora Medici, DO

## 2023-02-20 NOTE — Patient Instructions (Addendum)
It was great seeing you today!  Plan discussed at today's visit: -Urine test negative for UTI -Vaginal swap pending, more info about recurrent BV below - try over the counter boric acid once this infection is treated to help decrease risk of reoccurrence   Follow up in: as needed  Take care and let us know if you have any questions or concerns prior to your next visit.  Dr. Rosana Berger  Bacterial Vaginosis  Bacterial vaginosis is an infection that occurs when the normal balance of bacteria in the vagina changes. This change is caused by an overgrowth of certain bacteria in the vagina. Bacterial vaginosis is the most common vaginal infection among females aged 71 to 107 years. This condition increases the risk of sexually transmitted infections (STIs). Treatment can help reduce this risk. Treatment is very important for pregnant women because this condition can cause babies to be born early (prematurely) or at a low birth weight. What are the causes? This condition is caused by an increase in harmful bacteria that are normally present in small amounts in the vagina. However, the exact reason this condition develops is not known. You cannot get bacterial vaginosis from toilet seats, bedding, swimming pools, or contact with objects around you. What increases the risk? The following factors may make you more likely to develop this condition: Having a new sexual partner or multiple sexual partners, or having unprotected sex. Douching. Having an intrauterine device (IUD). Smoking. Abusing drugs and alcohol. This may lead to riskier sexual behavior. Taking certain antibiotic medicines. Being pregnant. What are the signs or symptoms? Some women with this condition have no symptoms. Symptoms may include: Pearline Cables or white vaginal discharge. The discharge can be watery or foamy. A fish-like odor with discharge, especially after sex or during menstruation. Itching in and around the vagina. Burning or  pain with urination. How is this diagnosed? This condition is diagnosed based on: Your medical history. A physical exam of the vagina. Checking a sample of vaginal fluid for harmful bacteria or abnormal cells. How is this treated? This condition is treated with antibiotic medicines. These may be given as a pill, a vaginal cream, or a medicine that is put into the vagina (suppository). If the condition comes back after treatment, a second round of antibiotics may be needed. Follow these instructions at home: Medicines Take or apply over-the-counter and prescription medicines only as told by your health care provider. Take or apply your antibiotic medicine as told by your health care provider. Do not stop using the antibiotic even if you start to feel better. General instructions If you have a female sexual partner, tell her that you have a vaginal infection. She should follow up with her health care provider. If you have a female sexual partner, he does not need treatment. Avoid sexual activity until you finish treatment. Drink enough fluid to keep your urine pale yellow. Keep the area around your vagina and rectum clean. Wash the area daily with warm water. Wipe yourself from front to back after using the toilet. If you are breastfeeding, talk to your health care provider about continuing breastfeeding during treatment. Keep all follow-up visits. This is important. How is this prevented? Self-care Do not douche. Wash the outside of your vagina with warm water only. Wear cotton or cotton-lined underwear. Avoid wearing tight pants and pantyhose, especially during the summer. Safe sex Use protection when having sex. This includes: Using condoms. Using dental dams. This is a thin layer of a material made  of latex or polyurethane that protects the mouth during oral sex. Limit the number of sexual partners. To help prevent bacterial vaginosis, it is best to have sex with just one partner  (monogamous relationship). Make sure you and your sexual partner are tested for STIs. Drugs and alcohol Do not use any products that contain nicotine or tobacco. These products include cigarettes, chewing tobacco, and vaping devices, such as e-cigarettes. If you need help quitting, ask your health care provider. Do not use drugs. Do not drink alcohol if: Your health care provider tells you not to do this. You are pregnant, may be pregnant, or are planning to become pregnant. If you drink alcohol: Limit how much you have to 0-1 drink a day. Be aware of how much alcohol is in your drink. In the [place] one drink equals one 12 oz bottle of beer (355 mL), one 5 oz glass of wine (148 mL), or one 1 oz glass of hard liquor (44 mL). Where to find more information Centers for Disease Control and Prevention: http://www.wolf.info/ American Sexual Health Association (ASHA): www.ashastd.org [place].S. Department of Health and Financial controller, Office on Women's Health: [place] Contact a health care provider if: Your symptoms do not improve, even after treatment. You have more discharge or pain when urinating. You have a fever or chills. You have pain in your abdomen or pelvis. You have pain during sex. You have vaginal bleeding between menstrual periods. Summary Bacterial vaginosis is a vaginal infection that occurs when the normal balance of bacteria in the vagina changes. It results from an overgrowth of certain bacteria. This condition increases the risk of sexually transmitted infections (STIs). Getting treated can help reduce this risk. Treatment is very important for pregnant women because this condition can cause babies to be born early (prematurely) or at low birth weight. This condition is treated with antibiotic medicines. These may be given as a pill, a vaginal cream, or a medicine that is put into the vagina (suppository). This information is not intended to replace advice given to you by your  health care provider. Make sure you discuss any questions you have with your health care provider. Document Revised: 05/28/2020 Document Reviewed: 05/28/2020 Elsevier Patient Education  [place].

## 2023-02-22 ENCOUNTER — Encounter: Payer: Self-pay | Admitting: Internal Medicine

## 2023-02-22 LAB — CERVICOVAGINAL ANCILLARY ONLY
Bacterial Vaginitis (gardnerella): NEGATIVE
Chlamydia: NEGATIVE
Comment: NEGATIVE
Comment: NEGATIVE
Comment: NEGATIVE
Comment: NORMAL
Neisseria Gonorrhea: NEGATIVE
Trichomonas: NEGATIVE

## 2023-02-23 ENCOUNTER — Ambulatory Visit: Payer: 59

## 2023-02-24 ENCOUNTER — Ambulatory Visit (INDEPENDENT_AMBULATORY_CARE_PROVIDER_SITE_OTHER): Payer: 59

## 2023-02-24 DIAGNOSIS — E538 Deficiency of other specified B group vitamins: Secondary | ICD-10-CM | POA: Diagnosis not present

## 2023-02-24 MED ORDER — CYANOCOBALAMIN 1000 MCG/ML IJ SOLN
1000.0000 ug | Freq: Once | INTRAMUSCULAR | Status: AC
  Administered 2023-02-24: 1000 ug via INTRAMUSCULAR

## 2023-02-28 ENCOUNTER — Other Ambulatory Visit: Payer: Self-pay

## 2023-02-28 ENCOUNTER — Encounter: Payer: Self-pay | Admitting: Gastroenterology

## 2023-02-28 MED ORDER — PANTOPRAZOLE SODIUM 40 MG PO TBEC: 40.0000 mg | DELAYED_RELEASE_TABLET | Freq: Two times a day (BID) | ORAL | 3 refills | Status: DC

## 2023-02-28 NOTE — Telephone Encounter (Signed)
1.  Appears that you had contacted her to commence on Diflucan for the candidiasis confirmed that she took 2 weeks of treatment  2.  Yes he can change it to Protonix 40 mg twice daily from dexlansoprazole if it does not work let her know that she can try vonoprazan we can give her samples at that point

## 2023-03-15 ENCOUNTER — Encounter: Payer: Self-pay | Admitting: Gastroenterology

## 2023-03-15 NOTE — Telephone Encounter (Signed)
If dexlansoprazole has helped I would suggest to continue taking it, if not really helping then try vonoprazan samples

## 2023-03-22 ENCOUNTER — Other Ambulatory Visit: Payer: Self-pay | Admitting: Family Medicine

## 2023-03-22 DIAGNOSIS — R110 Nausea: Secondary | ICD-10-CM

## 2023-03-22 MED ORDER — PROMETHAZINE HCL 12.5 MG PO TABS: 12.5000 mg | ORAL_TABLET | Freq: Three times a day (TID) | ORAL | 0 refills | Status: DC | PRN

## 2023-03-28 ENCOUNTER — Other Ambulatory Visit: Payer: Self-pay | Admitting: Family Medicine

## 2023-03-28 DIAGNOSIS — J4540 Moderate persistent asthma, uncomplicated: Secondary | ICD-10-CM

## 2023-03-30 ENCOUNTER — Ambulatory Visit
Admission: RE | Admit: 2023-03-30 | Discharge: 2023-03-30 | Disposition: A | Discharge status: Home / Self Care | Payer: 59 | Source: Ambulatory Visit | Attending: Family Medicine | Admitting: Family Medicine

## 2023-03-30 DIAGNOSIS — N63 Unspecified lump in unspecified breast: Secondary | ICD-10-CM | POA: Diagnosis not present

## 2023-03-31 ENCOUNTER — Other Ambulatory Visit: Payer: Self-pay

## 2023-03-31 DIAGNOSIS — Z1231 Encounter for screening mammogram for malignant neoplasm of breast: Secondary | ICD-10-CM

## 2023-03-31 DIAGNOSIS — R928 Other abnormal and inconclusive findings on diagnostic imaging of breast: Secondary | ICD-10-CM

## 2023-04-03 ENCOUNTER — Encounter: Payer: Self-pay | Admitting: Gastroenterology

## 2023-04-03 ENCOUNTER — Other Ambulatory Visit: Payer: Self-pay

## 2023-04-03 MED ORDER — PLECANATIDE 3 MG PO TABS: 1.0000 | ORAL_TABLET | Freq: Every day | ORAL | 3 refills | Status: AC

## 2023-04-03 NOTE — Progress Notes (Unsigned)
Name: Jessica Carroll   MRN: [id number]    DOB: [birth year]   Date:04/04/2023       Progress Note  Subjective  Chief Complaint  Follow Up  HPI  Depression/anxiety/Insomnia: phq 9 has improved, stress is down since her step son no longer living with them. He was placed in a group home since Summer of 2022  She states mind is always busy at night , wakes up and cannot fall back asleep. Trazodone caused a morning headache. We gave her Temazepam and seems to work but she is walking up with hot flashes, we gave her Effexor last visit but it made her feel wired and she stopped. I gave her hydroxizine that she takes prn anxiety and sleep but is no longer working for sleep, she tosses and turns all night. We will try seroquel.She is very nervous about going to [place] - she is afraid of flying - we will give her valium to take prior to boarding   Menopause: she weaned self off estradiol because she was having daily headache , last visit she said she had mood swings, night sweats and irritability. We gave her Effexor but could not tolerate , she is now taking Anxiety and Stress Relief otc and is feeling better She is using premarin topically prn , she decided not to take medication given by Gyn   Chronic constipation and recently diagnosed with IBS by Dr. Tobi Bastos, she is now on Trulance and it has helped with bloating and pain, she has bowel movements daily now and Bristol scale 4   Asthma moderated: she has been taking Advair and singulair and seems to be controlling her symptoms. Shewheezing or decrease in exercise tolerance but has a dry cough at night intermittently   GERD:  She was having worsening of symptoms and some intermittent RUQ pain, plus anemia, she went to see Dr. Tobi Bastos, taking Dexilant now and symptoms have improved, she had EGD done 01/2023 that showed yeast esophagitis, she took diflucan for two weeks and is doing well now    History of iron deficiency and B12 deficiency: Symptoms improved  since she got iron infusion. Last B12 was towards low end of normal - she is back on B12 injections and doing well She  had another shot today    Migraine: She was initially taking Ubrelvy every other day however since frequency of migraine decreased to about once a month she has been taking either Ubrelvy or Maxalt  She could not tolerate Topamax in the past. Migraine is associated with visual aura, episodes used to be severe associated with photophobia, phenophobia and nausea . Very seldom has episodes now and usually mild and not missing work lately   Intermittent back pain: takes flexeril prn and it works well for her, unchanged     Patient Active Problem List   Diagnosis Date Noted   Dysphagia 01/13/2023   Migraine with aura and without status migrainosus, not intractable 09/30/2022   Psychophysiological insomnia 09/30/2022   Perennial allergic rhinitis with seasonal variation 09/30/2022   Spasm of muscle of lower back 09/30/2022   Age-related nuclear cataract of right eye 01/19/2021   Corneal scar, right eye 01/19/2021   Moderate episode of recurrent major depressive disorder 12/20/2018   Iron deficiency anemia 12/19/2018   Type A blood, Rh positive 08/22/2017   S/P vaginal hysterectomy 07/31/2017   Recurrent vaginitis 01/10/2017   Hemorrhoids, external without complications 09/26/2016   Nausea in adult patient 02/24/2016   Endometriosis determined by  laparoscopy 01/25/2016   Chronic constipation 08/14/2015   Bilateral ganglion cysts of wrists 08/14/2015   Mild mitral regurgitation 05/19/2015   B12 deficiency 05/18/2015   Asthma, moderate 05/18/2015   Anxiety 05/18/2015   Insomnia, persistent 05/18/2015   Eczema of hand 05/18/2015   Viral keratitis 05/18/2015   Migraine without aura and without status migrainosus, not intractable 05/18/2015   Numerous moles 05/18/2015   Allergic rhinitis 05/18/2015   History of corneal transplant 05/18/2015   Gastroesophageal reflux  disease without esophagitis 12/16/2009    Past Surgical History:  Procedure Laterality Date   COLONOSCOPY WITH PROPOFOL N/A 09/29/2017   Procedure: COLONOSCOPY WITH PROPOFOL;  Surgeon: Wyline Mood, MD;  Location: [facility] ENDOSCOPY;  Service: Gastroenterology;  Laterality: N/A;   CORNEAL TRANSPLANT Right 01/07/2013   ESOPHAGOGASTRODUODENOSCOPY (EGD) WITH PROPOFOL N/A 09/29/2017   Procedure: ESOPHAGOGASTRODUODENOSCOPY (EGD) WITH PROPOFOL;  Surgeon: Wyline Mood, MD;  Location: [facility] ENDOSCOPY;  Service: Gastroenterology;  Laterality: N/A;   ESOPHAGOGASTRODUODENOSCOPY (EGD) WITH PROPOFOL N/A 01/13/2023   Procedure: ESOPHAGOGASTRODUODENOSCOPY (EGD) WITH PROPOFOL;  Surgeon: Wyline Mood, MD;  Location: [facility] ENDOSCOPY;  Service: Gastroenterology;  Laterality: N/A;   EYE SURGERY     eyelid surgery Right June 19, 2015   [facility] by Dr. Mickie Kay   LAPAROSCOPIC BILATERAL SALPINGECTOMY N/A 01/25/2016   Procedure: LAPAROSCOPIC BILATERAL SALPINGECTOMY, ADHESIOLYSIS, LAPAROSCOPIC EXCISION/FULGERATION ENDOMETRIOSIS ;  Surgeon: Herold Harms, MD;  Location: [facility] ORS;  Service: Gynecology;  Laterality: N/A;   Laparoscopic excision and fulguration of endometriosis N/A    bladder flap and uterosacral ligament endometriosis, excised and cauterized   UPPER GI ENDOSCOPY  07/19/2012   VAGINAL HYSTERECTOMY N/A 07/31/2017   Procedure: HYSTERECTOMY VAGINAL;  Surgeon: Herold Harms, MD;  Location: [facility] ORS;  Service: Gynecology;  Laterality: N/A;    Family History  Adopted: Yes  Problem Relation Age of Onset   Hyperlipidemia Mother    Hypertension Mother    Diabetes Father    Heart failure Father 86       hear attack   Cancer Neg Hx    Breast cancer Neg Hx     Social History   Tobacco Use   Smoking status: Never   Smokeless tobacco: Never  Substance Use Topics   Alcohol use: Not Currently    Alcohol/week: 0.0 standard drinks of alcohol    Comment: occ. wine     Current Outpatient  Medications:    albuterol (VENTOLIN HFA) 108 (90 Base) MCG/ACT inhaler, Inhale 2 puffs into the lungs every 6 (six) hours as needed for wheezing or shortness of breath., Disp: 18 g, Rfl: 0   carboxymethylcellul-glycerin (REFRESH OPTIVE) 0.5-0.9 % ophthalmic solution, Frequency:PHARMDIR   Dosage:0.0     Instructions:  Note:6 x OU Dose: 0.5%-0.9%, Disp: , Rfl:    conjugated estrogens (PREMARIN) vaginal cream, Place vaginally daily. For the first 2 weeks after that a few times a week - pea size on urethra and vaginal entry only, Disp: 42.5 g, Rfl: 5   cyclobenzaprine (FLEXERIL) 5 MG tablet, Take 1-2 tablets (5-10 mg total) by mouth 3 (three) times daily as needed for muscle spasms. Take mainly at bedtime as may make drowsy, do not take before driving., Disp: 90 tablet, Rfl: 0   Estradiol (IMVEXXY MAINTENANCE PACK) 10 MCG INST, 1 tablet per vagina 2 times per week at bedtime, Disp: 72 each, Rfl: 5   fluticasone-salmeterol (ADVAIR) 100-50 MCG/ACT AEPB, INHALE 1 PUFF INTO THE LUNGS 2 TIMES DAILY, Disp: 60 each, Rfl: 0   hydrOXYzine (  ATARAX) 10 MG tablet, Take 1-2 tablets (10-20 mg total) by mouth 3 (three) times daily as needed., Disp: 90 tablet, Rfl: 0   ibuprofen (ADVIL) 800 MG tablet, Take 1 tablet (800 mg total) by mouth every 8 (eight) hours as needed., Disp: 90 tablet, Rfl: 0   montelukast (SINGULAIR) 10 MG tablet, Take 1 tablet (10 mg total) by mouth at bedtime., Disp: 90 tablet, Rfl: 1   MULTIPLE VITAMIN PO, Take 1 tablet by mouth daily., Disp: , Rfl:    pantoprazole (PROTONIX) 40 MG tablet, Take 1 tablet (40 mg total) by mouth 2 (two) times daily., Disp: 180 tablet, Rfl: 3   Plecanatide 3 MG TABS, Take 1 tablet (3 mg total) by mouth daily., Disp: 90 tablet, Rfl: 3   prednisoLONE acetate (PRED FORTE) 1 % ophthalmic suspension, Place 1 drop into the right eye daily., Disp: , Rfl:    promethazine (PHENERGAN) 12.5 MG tablet, Take 1 tablet (12.5 mg total) by mouth every 8 (eight) hours as needed for  nausea or vomiting., Disp: 20 tablet, Rfl: 0   rizatriptan (MAXALT) 10 MG tablet, Take 1 tablet (10 mg total) by mouth 2 (two) times daily as needed., Disp: 10 tablet, Rfl: 1   triamcinolone (KENALOG) 0.1 %, Apply 1 application topically 2 (two) times daily as needed., Disp: 80 g, Rfl: 0   Ubrogepant (UBRELVY) 100 MG TABS, Take 1 tablet by mouth every other day., Disp: 16 tablet, Rfl: 5   valACYclovir (VALTREX) 500 MG tablet, Take 1 tablet by mouth 2 (two) times daily., Disp: , Rfl: 0   cetirizine (ZYRTEC) 5 MG tablet, Take by mouth., Disp: , Rfl:    fluconazole (DIFLUCAN) 150 MG tablet, Take 1 tablet (150 mg total) by mouth every other day. (Patient not taking: Reported on 04/04/2023), Disp: 3 tablet, Rfl: 0   fluconazole (DIFLUCAN) 200 MG tablet, Take 1 tablet (200 mg total) by mouth daily. (Patient not taking: Reported on 04/04/2023), Disp: 14 tablet, Rfl: 0   metroNIDAZOLE (FLAGYL) 500 MG tablet, Take 1 tablet (500 mg total) by mouth 2 (two) times daily. (Patient not taking: Reported on 04/04/2023), Disp: 14 tablet, Rfl: 0   predniSONE (STERAPRED UNI-PAK 21 TAB) 10 MG (21) TBPK tablet, Take per package directions. (Patient not taking: Reported on 04/04/2023), Disp: 21 tablet, Rfl: 0  Allergies  Allergen Reactions   Bactrim [Sulfamethoxazole-Trimethoprim] Nausea Only   Penicillins Nausea Only   Topamax [Topiramate] Other (See Comments)    Fatigue     I personally reviewed active problem list, medication list, allergies, family history, social history, health maintenance with the patient/caregiver today.   ROS  Ten systems reviewed and is negative except as mentioned in HPI   Objective  Vitals:   04/04/23 1432  BP: 118/68  Pulse: 79  Resp: 16  SpO2: 97%  Weight: 123 lb (55.8 kg)  Height:  (1.651 m)    Body mass index is 20.47 kg/m.  Physical Exam Constitutional: Patient appears well-developed and well-nourished.  No distress.  HEENT: head atraumatic, normocephalic,  pupils equal and reactive to light,, neck supple Cardiovascular: Normal rate, regular rhythm and normal heart sounds.  No murmur heard. No BLE edema. Pulmonary/Chest: Effort normal and breath sounds normal. No respiratory distress. Abdominal: Soft.  There is no tenderness. Psychiatric: Patient has a normal mood and affect. behavior is normal. Judgment and thought content normal.    PHQ2/9:    04/04/2023    2:31 PM 02/20/2023    2:10 PM 01/06/2023  7:52 AM 10/17/2022   11:33 AM 09/30/2022    2:46 PM  Depression screen PHQ 2/9  Decreased Interest 1 0 1 0 1  Down, Depressed, Hopeless 1 0 1 0 1  PHQ - 2 Score 2 0 2 0 2  Altered sleeping 3 0 3 1 0  Tired, decreased energy 0 0 0 0 0  Change in appetite 0 0 0 0 0  Feeling bad or failure about yourself  0 0 0 0 0  Trouble concentrating 0 0 0 0 0  Moving slowly or fidgety/restless 0 0 0 0 0  Suicidal thoughts 0 0 0 0 0  PHQ-9 Score 5 0 Difficult doing work/chores  Not difficult at all  Not difficult at all     phq 9 is positive   Fall Risk:    04/04/2023    2:31 PM 02/20/2023    2:10 PM 01/06/2023    7:52 AM 10/17/2022   11:32 AM 09/30/2022    2:46 PM  Fall Risk   Falls in the past year? 0 0 0 0 0  Number falls in past yr: 0 0 0  0  Injury with Fall? 0 0 0  0  Risk for fall due to : No Fall Risks  No Fall Risks No Fall Risks No Fall Risks  Follow up Falls prevention discussed  Falls prevention discussed Falls prevention discussed Falls prevention discussed      Functional Status Survey: Is the patient deaf or have difficulty hearing?: No Does the patient have difficulty seeing, even when wearing glasses/contacts?: No Does the patient have difficulty concentrating, remembering, or making decisions?: No Does the patient have difficulty walking or climbing stairs?: No Does the patient have difficulty dressing or bathing?: No Does the patient have difficulty doing errands alone such as visiting a doctor's office or  shopping?: No    Assessment & Plan  1. Moderate persistent asthma without complication  - montelukast (SINGULAIR) 10 MG tablet; Take 1 tablet (10 mg total) by mouth at bedtime.  Dispense: 90 tablet; Refill: 1 - albuterol (VENTOLIN HFA) 108 (90 Base) MCG/ACT inhaler; Inhale 2 puffs into the lungs every 6 (six) hours as needed for wheezing or shortness of breath.  Dispense: 18 g; Refill: 0 - fluticasone-salmeterol (ADVAIR) 100-50 MCG/ACT AEPB; Inhale 1 puff into the lungs 2 (two) times daily.  Dispense: 180 each; Refill: 1  2. Perennial allergic rhinitis with seasonal variation  - montelukast (SINGULAIR) 10 MG tablet; Take 1 tablet (10 mg total) by mouth at bedtime.  Dispense: 90 tablet; Refill: 1  3. Spasm of muscle of lower back  - cyclobenzaprine (FLEXERIL) 5 MG tablet; Take 1-2 tablets (5-10 mg total) by mouth 3 (three) times daily as needed for muscle spasms. Take mainly at bedtime as may make drowsy, do not take before driving.  Dispense: 90 tablet; Refill: 0  4. Anxiety  - hydrOXYzine (ATARAX) 10 MG tablet; Take 1-2 tablets (10-20 mg total) by mouth 3 (three) times daily as needed.  Dispense: 90 tablet; Refill: 0 - diazepam (VALIUM) 2 MG tablet; Take 1 tablet (2 mg total) by mouth daily as needed for anxiety. Prior to boarding the plane  Dispense: 4 tablet; Refill: 0  5. Psychophysiological insomnia  - hydrOXYzine (ATARAX) 10 MG tablet; Take 1-2 tablets (10-20 mg total) by mouth 3 (three) times daily as needed.  Dispense: 90 tablet; Refill: 0 - QUEtiapine (SEROQUEL) 25 MG tablet; Take 1 tablet (25 mg total) by  mouth at bedtime.  Dispense: 30 tablet; Refill: 2  6. Gastroesophageal reflux disease without esophagitis   7. B12 deficiency  - cyanocobalamin (VITAMIN B12) injection 1,000 mcg  8. Migraine with aura and without status migrainosus, not intractable   9. Vasomotor symptoms due to menopause  stable  10. Fear of flying  - diazepam (VALIUM) 2 MG tablet; Take 1  tablet (2 mg total) by mouth daily as needed for anxiety. Prior to boarding the plane  Dispense: 4 tablet; Refill: 0

## 2023-04-04 ENCOUNTER — Encounter: Payer: Self-pay | Admitting: Family Medicine

## 2023-04-04 ENCOUNTER — Ambulatory Visit: Payer: 59 | Admitting: Family Medicine

## 2023-04-04 VITALS — BP 118/68 | HR 79 | Resp 16 | Ht 65.0 in | Wt 123.0 lb

## 2023-04-04 DIAGNOSIS — F419 Anxiety disorder, unspecified: Secondary | ICD-10-CM

## 2023-04-04 DIAGNOSIS — J3089 Other allergic rhinitis: Secondary | ICD-10-CM

## 2023-04-04 DIAGNOSIS — J4540 Moderate persistent asthma, uncomplicated: Secondary | ICD-10-CM | POA: Diagnosis not present

## 2023-04-04 DIAGNOSIS — F5104 Psychophysiologic insomnia: Secondary | ICD-10-CM

## 2023-04-04 DIAGNOSIS — E538 Deficiency of other specified B group vitamins: Secondary | ICD-10-CM

## 2023-04-04 DIAGNOSIS — F40243 Fear of flying: Secondary | ICD-10-CM

## 2023-04-04 DIAGNOSIS — M62830 Muscle spasm of back: Secondary | ICD-10-CM

## 2023-04-04 DIAGNOSIS — N951 Menopausal and female climacteric states: Secondary | ICD-10-CM

## 2023-04-04 DIAGNOSIS — J302 Other seasonal allergic rhinitis: Secondary | ICD-10-CM

## 2023-04-04 DIAGNOSIS — K219 Gastro-esophageal reflux disease without esophagitis: Secondary | ICD-10-CM

## 2023-04-04 DIAGNOSIS — G43109 Migraine with aura, not intractable, without status migrainosus: Secondary | ICD-10-CM

## 2023-04-04 MED ORDER — CYCLOBENZAPRINE HCL 5 MG PO TABS
5.0000 mg | ORAL_TABLET | Freq: Three times a day (TID) | ORAL | 0 refills | Status: DC | PRN
Start: 2023-04-04 — End: 2024-10-15

## 2023-04-04 MED ORDER — FLUTICASONE-SALMETEROL 100-50 MCG/ACT IN AEPB: 1.0000 | INHALATION_SPRAY | Freq: Two times a day (BID) | RESPIRATORY_TRACT | 1 refills | Status: DC

## 2023-04-04 MED ORDER — ALBUTEROL SULFATE HFA 108 (90 BASE) MCG/ACT IN AERS
2.0000 | INHALATION_SPRAY | Freq: Four times a day (QID) | RESPIRATORY_TRACT | 0 refills | Status: DC | PRN
Start: 2023-04-04 — End: 2024-01-30

## 2023-04-04 MED ORDER — QUETIAPINE FUMARATE 25 MG PO TABS
25.0000 mg | ORAL_TABLET | Freq: Every day | ORAL | 2 refills | Status: DC
Start: 2023-04-04 — End: 2023-08-07

## 2023-04-04 MED ORDER — CYANOCOBALAMIN 1000 MCG/ML IJ SOLN
1000.0000 ug | Freq: Once | INTRAMUSCULAR | Status: AC
Start: 2023-04-04 — End: 2023-04-04
  Administered 2023-04-04: 1000 ug via INTRAMUSCULAR

## 2023-04-04 MED ORDER — DIAZEPAM 2 MG PO TABS
2.0000 mg | ORAL_TABLET | Freq: Every day | ORAL | 0 refills | Status: DC | PRN
Start: 2023-04-04 — End: 2023-08-07

## 2023-04-04 MED ORDER — MONTELUKAST SODIUM 10 MG PO TABS
10.0000 mg | ORAL_TABLET | Freq: Every day | ORAL | 1 refills | Status: DC
Start: 2023-04-04 — End: 2024-03-08

## 2023-04-04 MED ORDER — HYDROXYZINE HCL 10 MG PO TABS
10.0000 mg | ORAL_TABLET | Freq: Three times a day (TID) | ORAL | 0 refills | Status: DC | PRN
Start: 2023-04-04 — End: 2024-03-08

## 2023-05-05 ENCOUNTER — Ambulatory Visit (INDEPENDENT_AMBULATORY_CARE_PROVIDER_SITE_OTHER): Payer: 59

## 2023-05-05 DIAGNOSIS — E538 Deficiency of other specified B group vitamins: Secondary | ICD-10-CM

## 2023-05-05 MED ORDER — CYANOCOBALAMIN 1000 MCG/ML IJ SOLN
1000.0000 ug | Freq: Once | INTRAMUSCULAR | Status: AC
Start: 2023-05-05 — End: 2023-05-05
  Administered 2023-05-05: 1000 ug via INTRAMUSCULAR

## 2023-05-10 ENCOUNTER — Other Ambulatory Visit: Payer: Self-pay | Admitting: Gastroenterology

## 2023-06-06 ENCOUNTER — Telehealth: Payer: Self-pay | Admitting: Gastroenterology

## 2023-06-06 NOTE — Telephone Encounter (Signed)
Patient called in because she stated her insurance no longer pays for dexlansoprazole that medication. She is wondering if it is anything else that would work for her that her insurance will pay. The patient stated that she have already tried pantoprazole this medication and it did not work. She would like a call back as soon as possible. She stated that she is almost at out medicine she only have four days left.

## 2023-06-07 ENCOUNTER — Encounter: Payer: Self-pay | Admitting: Gastroenterology

## 2023-06-07 NOTE — Telephone Encounter (Signed)
Patient's insurance denied for her to get Dexilant. They came back with alternatives such as: Lansoprasole, Omeprazole, and Pantoprazole. Dr. Tobi Bastos, can you please let me know which medication will be best for the patient? Thank you.

## 2023-06-09 ENCOUNTER — Ambulatory Visit (INDEPENDENT_AMBULATORY_CARE_PROVIDER_SITE_OTHER): Payer: 59

## 2023-06-09 DIAGNOSIS — E538 Deficiency of other specified B group vitamins: Secondary | ICD-10-CM | POA: Diagnosis not present

## 2023-06-09 MED ORDER — CYANOCOBALAMIN 1000 MCG/ML IJ SOLN
1000.0000 ug | Freq: Once | INTRAMUSCULAR | Status: AC
Start: 2023-06-09 — End: 2023-06-09
  Administered 2023-06-09: 1000 ug via INTRAMUSCULAR

## 2023-06-19 ENCOUNTER — Other Ambulatory Visit: Payer: Self-pay

## 2023-06-19 MED ORDER — LANSOPRAZOLE 30 MG PO CPDR: 30.0000 mg | DELAYED_RELEASE_CAPSULE | Freq: Every day | ORAL | 3 refills | Status: DC

## 2023-07-07 ENCOUNTER — Ambulatory Visit: Payer: 59

## 2023-07-11 ENCOUNTER — Ambulatory Visit (INDEPENDENT_AMBULATORY_CARE_PROVIDER_SITE_OTHER): Payer: 59

## 2023-07-11 DIAGNOSIS — E538 Deficiency of other specified B group vitamins: Secondary | ICD-10-CM | POA: Diagnosis not present

## 2023-07-11 MED ORDER — CYANOCOBALAMIN 1000 MCG/ML IJ SOLN
1000.0000 ug | Freq: Once | INTRAMUSCULAR | Status: AC
Start: 2023-07-11 — End: 2023-07-11
  Administered 2023-07-11: 1000 ug via INTRAMUSCULAR

## 2023-07-11 NOTE — Progress Notes (Signed)
Patient presented to clinic in expressed good health for B-12 injection. Patient expressed no prior adverse effects from receiving the injections and tolerated today's injection well. She had no questions or concerns to express at the time of clinic departure and no adverse reaction was immediately noted.

## 2023-07-26 ENCOUNTER — Encounter: Payer: Self-pay | Admitting: Internal Medicine

## 2023-07-26 ENCOUNTER — Ambulatory Visit (INDEPENDENT_AMBULATORY_CARE_PROVIDER_SITE_OTHER): Payer: 59 | Admitting: Internal Medicine

## 2023-07-26 ENCOUNTER — Other Ambulatory Visit (HOSPITAL_COMMUNITY)
Admission: RE | Admit: 2023-07-26 | Discharge: 2023-07-26 | Disposition: A | Discharge status: Home / Self Care | Payer: 59 | Source: Ambulatory Visit | Attending: Internal Medicine | Admitting: Internal Medicine

## 2023-07-26 VITALS — BP 118/70 | HR 67 | Temp 98.2°F | Resp 18 | Ht 65.0 in | Wt 121.0 lb

## 2023-07-26 DIAGNOSIS — N949 Unspecified condition associated with female genital organs and menstrual cycle: Secondary | ICD-10-CM

## 2023-07-26 LAB — POCT URINALYSIS DIPSTICK
Bilirubin, UA: NEGATIVE
Blood, UA: NEGATIVE
Glucose, UA: NEGATIVE
Ketones, UA: NEGATIVE
Leukocytes, UA: NEGATIVE
Nitrite, UA: NEGATIVE
Odor: NORMAL
Protein, UA: NEGATIVE
Spec Grav, UA: 1.02 (ref 1.010–1.025)
Urobilinogen, UA: 0.2 E.U./dL
pH, UA: 7 (ref 5.0–8.0)

## 2023-07-26 MED ORDER — FLUCONAZOLE 150 MG PO TABS
150.0000 mg | ORAL_TABLET | Freq: Once | ORAL | 0 refills | Status: AC
Start: 2023-07-26 — End: 2023-07-26

## 2023-07-26 NOTE — Progress Notes (Signed)
   Acute Office Visit  Subjective:     Patient ID: Jessica Carroll, female    DOB: 1971-07-07, 52 y.o.   MRN: [id number]  Chief Complaint  Patient presents with   vaginal burning    HPI Patient is in today for vaginal burning. Going on for 3 days, pain right on the inside of the vagina, no pain or symptoms on the outside. Denies urinary symptoms, no dysuria, hematuria, increased urinary urgency or frequency. No abdominal pain. Denies changes in vaginal discharge, has noticed a mild odor. Is sexually active with monogamous female partner. Tried OTC Monostat without relief in symptoms. Does have a history of recurrent BV, has used boric acid in the past but not using currently.  Review of Systems  Constitutional:  Negative for chills and fever.  Gastrointestinal:  Negative for abdominal pain.  Genitourinary:  Negative for dysuria, flank pain, frequency, hematuria and urgency.        Objective:    BP 118/70   Pulse 67   Temp 98.2 F (36.8 C)   Resp 18   Ht 5\' 5"  (1.651 m)   Wt 121 lb (54.9 kg)   LMP 07/11/2017 (Exact Date) Comment: patient still has her ovaries  SpO2 98%   BMI 20.14 kg/m  BP Readings from Last 3 Encounters:  07/26/23 118/70  04/04/23 118/68  02/20/23 122/70   Wt Readings from Last 3 Encounters:  07/26/23 121 lb (54.9 kg)  04/04/23 123 lb (55.8 kg)  02/20/23 122 lb 12.8 oz (55.7 kg)      Physical Exam Constitutional:      Appearance: Normal appearance.  HENT:     Head: Normocephalic and atraumatic.  Eyes:     Conjunctiva/sclera: Conjunctivae normal.  Cardiovascular:     Rate and Rhythm: Normal rate and regular rhythm.  Pulmonary:     Effort: Pulmonary effort is normal.     Breath sounds: Normal breath sounds.  Skin:    General: Skin is warm and dry.  Neurological:     General: No focal deficit present.     Mental Status: She is alert. Mental status is at baseline.  Psychiatric:        Mood and Affect: Mood normal.        Behavior: Behavior  normal.     No results found for any visits on 07/26/23.      Assessment & Plan:   1. Vaginal burning: UA normal here today, vaginal swab pending. Will empirically treat with Diflucan. Discussed using the boric acid again if this is another BV infection.   - POCT urinalysis dipstick - Cervicovaginal ancillary only - fluconazole (DIFLUCAN) 150 MG tablet; Take 1 tablet (150 mg total) by mouth once for 1 dose.  Dispense: 3 tablet; Refill: 0  Return if symptoms worsen or fail to improve.  Margarita Mail, DO

## 2023-07-27 LAB — CERVICOVAGINAL ANCILLARY ONLY
Bacterial Vaginitis (gardnerella): NEGATIVE
Candida Glabrata: NEGATIVE
Candida Vaginitis: NEGATIVE
Comment: NEGATIVE
Comment: NEGATIVE
Comment: NEGATIVE

## 2023-08-04 ENCOUNTER — Ambulatory Visit: Payer: 59 | Admitting: Family Medicine

## 2023-08-07 ENCOUNTER — Encounter: Payer: Self-pay | Admitting: Family Medicine

## 2023-08-07 ENCOUNTER — Ambulatory Visit: Payer: 59 | Admitting: Family Medicine

## 2023-08-07 VITALS — BP 114/70 | HR 88 | Temp 97.6°F | Resp 12 | Ht 65.0 in | Wt 120.4 lb

## 2023-08-07 DIAGNOSIS — F330 Major depressive disorder, recurrent, mild: Secondary | ICD-10-CM | POA: Diagnosis not present

## 2023-08-07 DIAGNOSIS — L308 Other specified dermatitis: Secondary | ICD-10-CM

## 2023-08-07 DIAGNOSIS — R350 Frequency of micturition: Secondary | ICD-10-CM

## 2023-08-07 DIAGNOSIS — L309 Dermatitis, unspecified: Secondary | ICD-10-CM | POA: Diagnosis not present

## 2023-08-07 DIAGNOSIS — E538 Deficiency of other specified B group vitamins: Secondary | ICD-10-CM

## 2023-08-07 DIAGNOSIS — K219 Gastro-esophageal reflux disease without esophagitis: Secondary | ICD-10-CM

## 2023-08-07 DIAGNOSIS — N952 Postmenopausal atrophic vaginitis: Secondary | ICD-10-CM

## 2023-08-07 DIAGNOSIS — J4540 Moderate persistent asthma, uncomplicated: Secondary | ICD-10-CM

## 2023-08-07 DIAGNOSIS — G43109 Migraine with aura, not intractable, without status migrainosus: Secondary | ICD-10-CM | POA: Diagnosis not present

## 2023-08-07 DIAGNOSIS — F5104 Psychophysiologic insomnia: Secondary | ICD-10-CM

## 2023-08-07 DIAGNOSIS — M5489 Other dorsalgia: Secondary | ICD-10-CM

## 2023-08-07 LAB — POCT URINALYSIS DIPSTICK (MANUAL)
Leukocytes, UA: NEGATIVE
Nitrite, UA: NEGATIVE
Poct Glucose: NORMAL mg/dL
Poct Protein: NEGATIVE mg/dL
Poct Urobilinogen: NORMAL mg/dL
Spec Grav, UA: 1.015 (ref 1.010–1.025)
pH, UA: 6 (ref 5.0–8.0)

## 2023-08-07 MED ORDER — TRIAMCINOLONE ACETONIDE 0.1 % EX CREA
1.0000 | TOPICAL_CREAM | Freq: Two times a day (BID) | CUTANEOUS | 0 refills | Status: AC | PRN
Start: 2023-08-07 — End: ?

## 2023-08-07 MED ORDER — TEMAZEPAM 15 MG PO CAPS
15.0000 mg | ORAL_CAPSULE | Freq: Every evening | ORAL | 0 refills | Status: DC | PRN
Start: 2023-08-07 — End: 2023-11-30

## 2023-08-07 MED ORDER — VAGIFEM 10 MCG VA TABS
1.0000 | ORAL_TABLET | VAGINAL | 12 refills | Status: DC
Start: 2023-08-07 — End: 2024-03-08

## 2023-08-07 MED ORDER — LIDOCAINE 5 % EX PTCH
1.0000 | MEDICATED_PATCH | CUTANEOUS | 0 refills | Status: DC
Start: 2023-08-07 — End: 2024-03-08

## 2023-08-07 MED ORDER — UBRELVY 100 MG PO TABS
1.0000 | ORAL_TABLET | ORAL | 5 refills | Status: DC
Start: 2023-08-07 — End: 2024-03-08
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
## 2023-09-12 ENCOUNTER — Telehealth: Payer: Self-pay | Admitting: Gastroenterology

## 2023-09-12 NOTE — Telephone Encounter (Signed)
Patient called in to see when her last colonoscopy and she wanted to know the date of her next appointment.

## 2023-10-16 ENCOUNTER — Ambulatory Visit: Payer: 59 | Admitting: Gastroenterology

## 2023-11-02 ENCOUNTER — Ambulatory Visit: Payer: 59 | Admitting: Internal Medicine

## 2023-11-17 NOTE — Progress Notes (Signed)
Name: Jessica Carroll   MRN: [id number]    DOB: 20-Jul-1971   Date:11/30/2023       Progress Note  Subjective  Chief Complaint  Chief Complaint  Patient presents with   Medical Management of Chronic Issues    HPI  Discussed the use of AI scribe software for clinical note transcription with the patient, who gave verbal consent to proceed.  History of Present Illness   The patient, with a history of depression, anxiety, sleep issues, and migraines, presents for a regular follow-up. Over the past three weeks, she has experienced a significant increase in anxiety, which she attributes to increased work responsibilities and possible menopause. The anxiety manifests as chest tightness, lasting about 30-40 minutes, and occurring daily. The patient denies associated sweating or nausea. She has been managing these episodes without medication, but the frequency and intensity of the symptoms have been distressing.  The patient also reports a recent flare-up of hemorrhoids, associated with constipation despite taking Trulance. She has been supplementing with stool softeners and suspects certain dietary factors, such as cheese, may be exacerbating the constipation.  Her migraines have been relatively well-controlled with Bernita Raisin, which she prefers over Maxalt. She reports needing to take the medication approximately three times a week, particularly during periods of high stress. Associated symptoms include light sensitivity and nausea, for which she takes promethazine.  The patient's asthma is well-controlled with twice-daily Advair, and she reports no current wheezing, cough, or shortness of breath. She has been off montelukast but is considering restarting it due to recent allergy symptoms.  She has a history of iron and B12 deficiencies, for which she received iron infusions and B12 injections, respectively. However, she stopped the B12 injections due to cost and switched to sublingual supplements,  which she feels are less effective. She is considering returning to the injections.  Her GERD is well-managed with Prevacid, and she reports no current symptoms. She has a history of IBS, which is currently under control.         Patient Active Problem List   Diagnosis Date Noted   Migraine with aura and without status migrainosus, not intractable 09/30/2022   Psychophysiological insomnia 09/30/2022   Perennial allergic rhinitis with seasonal variation 09/30/2022   Spasm of muscle of lower back 09/30/2022   Age-related nuclear cataract of right eye 01/19/2021   Corneal scar, right eye 01/19/2021   Moderate episode of recurrent major depressive disorder (HCC) 12/20/2018   Iron deficiency anemia 12/19/2018   Type A blood, Rh positive 08/22/2017   S/P vaginal hysterectomy 07/31/2017   Recurrent vaginitis 01/10/2017   Hemorrhoids, external without complications 09/26/2016   Nausea in adult patient 02/24/2016   Endometriosis determined by laparoscopy 01/25/2016   Chronic constipation 08/14/2015   Bilateral ganglion cysts of wrists 08/14/2015   Mild mitral regurgitation 05/19/2015   B12 deficiency 05/18/2015   Asthma, moderate 05/18/2015   Anxiety 05/18/2015   Insomnia, persistent 05/18/2015   Eczema of hand 05/18/2015   Viral keratitis 05/18/2015   Migraine without aura and without status migrainosus, not intractable 05/18/2015   Numerous moles 05/18/2015   Allergic rhinitis 05/18/2015   History of corneal transplant 05/18/2015   Gastroesophageal reflux disease without esophagitis 12/16/2009    Past Surgical History:  Procedure Laterality Date   COLONOSCOPY WITH PROPOFOL N/A 09/29/2017   Procedure: COLONOSCOPY WITH PROPOFOL;  Surgeon: Wyline Mood, MD;  Location: [facility] ENDOSCOPY;  Service: Gastroenterology;  Laterality: N/A;   CORNEAL TRANSPLANT Right 01/07/2013   ESOPHAGOGASTRODUODENOSCOPY (EGD)  WITH PROPOFOL N/A 09/29/2017   Procedure: ESOPHAGOGASTRODUODENOSCOPY (EGD) WITH  PROPOFOL;  Surgeon: Wyline Mood, MD;  Location: [facility] ENDOSCOPY;  Service: Gastroenterology;  Laterality: N/A;   ESOPHAGOGASTRODUODENOSCOPY (EGD) WITH PROPOFOL N/A 01/13/2023   Procedure: ESOPHAGOGASTRODUODENOSCOPY (EGD) WITH PROPOFOL;  Surgeon: Wyline Mood, MD;  Location: [facility] ENDOSCOPY;  Service: Gastroenterology;  Laterality: N/A;   EYE SURGERY     eyelid surgery Right June 19, 2015   [facility] by Dr. Mickie Kay   LAPAROSCOPIC BILATERAL SALPINGECTOMY N/A 01/25/2016   Procedure: LAPAROSCOPIC BILATERAL SALPINGECTOMY, ADHESIOLYSIS, LAPAROSCOPIC EXCISION/FULGERATION ENDOMETRIOSIS ;  Surgeon: Herold Harms, MD;  Location: [facility] ORS;  Service: Gynecology;  Laterality: N/A;   Laparoscopic excision and fulguration of endometriosis N/A    bladder flap and uterosacral ligament endometriosis, excised and cauterized   UPPER GI ENDOSCOPY  07/19/2012   VAGINAL HYSTERECTOMY N/A 07/31/2017   Procedure: HYSTERECTOMY VAGINAL;  Surgeon: Herold Harms, MD;  Location: [facility] ORS;  Service: Gynecology;  Laterality: N/A;    Family History  Adopted: Yes  Problem Relation Age of Onset   Hyperlipidemia Mother    Hypertension Mother    Diabetes Father    Heart failure Father 20       hear attack   Cancer Neg Hx    Breast cancer Neg Hx     Social History   Tobacco Use   Smoking status: Never   Smokeless tobacco: Never  Substance Use Topics   Alcohol use: Not Currently    Alcohol/week: 0.0 standard drinks of alcohol    Comment: occ. wine     Current Outpatient Medications:    albuterol (VENTOLIN HFA) 108 (90 Base) MCG/ACT inhaler, Inhale 2 puffs into the lungs every 6 (six) hours as needed for wheezing or shortness of breath., Disp: 18 g, Rfl: 0   busPIRone (BUSPAR) 5 MG tablet, Take 1-3 tablets (5-15 mg total) by mouth 3 (three) times daily., Disp: 270 tablet, Rfl: 0   carboxymethylcellul-glycerin (REFRESH OPTIVE) 0.5-0.9 % ophthalmic solution, Frequency:PHARMDIR   Dosage:0.0      Instructions:  Note:6 x OU Dose: 0.5%-0.9%, Disp: , Rfl:    cyclobenzaprine (FLEXERIL) 5 MG tablet, Take 1-2 tablets (5-10 mg total) by mouth 3 (three) times daily as needed for muscle spasms. Take mainly at bedtime as may make drowsy, do not take before driving., Disp: 90 tablet, Rfl: 0   Estradiol (VAGIFEM) 10 MCG TABS vaginal tablet, Place 1 tablet (10 mcg total) vaginally 2 (two) times a week., Disp: 8 tablet, Rfl: 12   fluticasone-salmeterol (ADVAIR) 100-50 MCG/ACT AEPB, Inhale 1 puff into the lungs 2 (two) times daily., Disp: 180 each, Rfl: 1   hydrOXYzine (ATARAX) 10 MG tablet, Take 1-2 tablets (10-20 mg total) by mouth 3 (three) times daily as needed., Disp: 90 tablet, Rfl: 0   ibuprofen (ADVIL) 800 MG tablet, Take 1 tablet (800 mg total) by mouth every 8 (eight) hours as needed., Disp: 90 tablet, Rfl: 0   lansoprazole (PREVACID) 30 MG capsule, Take 1 capsule (30 mg total) by mouth daily at 12 noon., Disp: 90 capsule, Rfl: 3   lidocaine (LIDODERM) 5 %, Place 1 patch onto the skin daily. Remove & Discard patch within 12 hours or as directed by MD, Disp: 30 patch, Rfl: 0   montelukast (SINGULAIR) 10 MG tablet, Take 1 tablet (10 mg total) by mouth at bedtime., Disp: 90 tablet, Rfl: 1   MULTIPLE VITAMIN PO, Take 1 tablet by mouth daily., Disp: , Rfl:    Plecanatide 3 MG  TABS, Take 1 tablet (3 mg total) by mouth daily., Disp: 90 tablet, Rfl: 3   prednisoLONE acetate (PRED FORTE) 1 % ophthalmic suspension, Place 1 drop into the right eye daily., Disp: , Rfl:    QUEtiapine (SEROQUEL) 25 MG tablet, Take 1 tablet (25 mg total) by mouth at bedtime., Disp: 30 tablet, Rfl: 0   triamcinolone cream (KENALOG) 0.1 %, Apply 1 Application topically 2 (two) times daily as needed., Disp: 80 g, Rfl: 0   Ubrogepant (UBRELVY) 100 MG TABS, Take 1 tablet (100 mg total) by mouth every other day., Disp: 16 tablet, Rfl: 5   valACYclovir (VALTREX) 500 MG tablet, Take 1 tablet by mouth 2 (two) times daily., Disp: , Rfl:  0   cetirizine (ZYRTEC) 5 MG tablet, Take by mouth., Disp: , Rfl:    promethazine (PHENERGAN) 12.5 MG tablet, Take 1 tablet (12.5 mg total) by mouth every 8 (eight) hours as needed for nausea or vomiting., Disp: 20 tablet, Rfl: 0  Current Facility-Administered Medications:    cyanocobalamin (VITAMIN B12) injection 1,000 mcg, 1,000 mcg, Intramuscular, Once,   Allergies  Allergen Reactions   Bactrim [Sulfamethoxazole-Trimethoprim] Nausea Only   Penicillins Nausea Only   Topamax [Topiramate] Other (See Comments)    Fatigue     I personally reviewed active problem list, medication list, allergies, family history with the patient/caregiver today.   ROS  Ten systems reviewed and is negative except as mentioned in HPI    Objective  Vitals:   11/30/23 0816  BP: 122/74  Pulse: 88  Resp: 16  Temp: (!) 97.5 F (36.4 C)  TempSrc: Oral  Weight: 124 lb 11.2 oz (56.6 kg)  Height: 5\' 5"  (1.651 m)    Body mass index is 20.75 kg/m.  Physical Exam  Constitutional: Patient appears well-developed and well-nourished.  No distress.  HEENT: head atraumatic, normocephalic, pupils equal and reactive to light, neck supple Cardiovascular: Normal rate, regular rhythm and normal heart sounds.  No murmur heard. No BLE edema. Pulmonary/Chest: Effort normal and breath sounds normal. No respiratory distress. Abdominal: Soft.  There is no tenderness. Psychiatric: Patient has a normal mood and affect. behavior is normal. Judgment and thought content normal.   PHQ2/9:    11/30/2023    8:15 AM 08/07/2023    2:17 PM 07/26/2023    8:50 AM 04/04/2023    2:31 PM 02/20/2023    2:10 PM  Depression screen PHQ 2/9  Decreased Interest 0 2 0 1 0  Down, Depressed, Hopeless 0 2 0 1 0  PHQ - 2 Score 0 4 0 2 0  Altered sleeping 0 1 0 3 0  Tired, decreased energy 0 2 0 0 0  Change in appetite 0 2 0 0 0  Feeling bad or failure about yourself  0 0 0 0 0  Trouble concentrating 0 0 0 0 0  Moving slowly or  fidgety/restless 0 0 0 0 0  Suicidal thoughts 0 0 0 0 0  PHQ-9 Score 0 9 0 5 0  Difficult doing work/chores Not difficult at all Very difficult Not difficult at all  Not difficult at all    phq 9 is negative   Fall Risk:    11/30/2023    8:15 AM 08/07/2023    2:17 PM 07/26/2023    8:49 AM 04/04/2023    2:31 PM 02/20/2023    2:10 PM  Fall Risk   Falls in the past year? 0 0 0 0 0  Number falls in past  yr: 0 0 0 0 0  Injury with Fall? 0 0 0 0 0  Risk for fall due to : No Fall Risks No Fall Risks  No Fall Risks   Follow up Falls prevention discussed;Education provided;Falls evaluation completed Falls prevention discussed;Education provided;Falls evaluation completed  Falls prevention discussed       Assessment & Plan      Anxiety Increased anxiety over the past three weeks, possibly related to increased work responsibilities and menopause. Daily episodes of chest tightness and nervousness lasting 30-40 minutes. No associated sweating or nausea. -Start Buspar 5mg  as needed, up to three times a day. -Continue Hydroxyzine 10mg  at night for sleep.  Insomnia Difficulty maintaining sleep. -Start Quetiapine 25mg  at night.  Migraines Occasional migraines managed with Bernita Raisin as needed. Lynelle Doctor as needed. -Prescribe Promethazine for associated nausea.  Asthma Well controlled on Advair twice daily. -Continue Advair twice daily. -Resume Montelukast for seasonal allergies.  Irritable Bowel Syndrome (IBS) with constipation Constipation causing hemorrhoid flare-ups. -Continue Trulance. -Add stool softeners and increase dietary fiber. -Consider follow-up with gastroenterologist for hemorrhoid management.  Iron and B12 deficiency Previously managed with iron infusions and B12 injections. Recently switched to sublingual B12 due to cost, but not feeling as well. -Resume B12 injections.  Gastroesophageal Reflux Disease (GERD) Well controlled on Prevacid. -Continue  Prevacid.  General Health Maintenance -Order bone density test for patient's mother. -Follow-up in three months. Monthly B12 injections.

## 2023-11-30 ENCOUNTER — Ambulatory Visit (INDEPENDENT_AMBULATORY_CARE_PROVIDER_SITE_OTHER): Payer: 59 | Admitting: Family Medicine

## 2023-11-30 ENCOUNTER — Encounter: Payer: Self-pay | Admitting: Family Medicine

## 2023-11-30 VITALS — BP 122/74 | HR 88 | Temp 97.5°F | Resp 16 | Ht 65.0 in | Wt 124.7 lb

## 2023-11-30 DIAGNOSIS — G43109 Migraine with aura, not intractable, without status migrainosus: Secondary | ICD-10-CM

## 2023-11-30 DIAGNOSIS — E538 Deficiency of other specified B group vitamins: Secondary | ICD-10-CM

## 2023-11-30 DIAGNOSIS — Z862 Personal history of diseases of the blood and blood-forming organs and certain disorders involving the immune mechanism: Secondary | ICD-10-CM

## 2023-11-30 DIAGNOSIS — Z23 Encounter for immunization: Secondary | ICD-10-CM

## 2023-11-30 DIAGNOSIS — J3089 Other allergic rhinitis: Secondary | ICD-10-CM

## 2023-11-30 DIAGNOSIS — F5104 Psychophysiologic insomnia: Secondary | ICD-10-CM | POA: Diagnosis not present

## 2023-11-30 DIAGNOSIS — J302 Other seasonal allergic rhinitis: Secondary | ICD-10-CM

## 2023-11-30 DIAGNOSIS — J4540 Moderate persistent asthma, uncomplicated: Secondary | ICD-10-CM | POA: Diagnosis not present

## 2023-11-30 DIAGNOSIS — F419 Anxiety disorder, unspecified: Secondary | ICD-10-CM | POA: Diagnosis not present

## 2023-11-30 DIAGNOSIS — K219 Gastro-esophageal reflux disease without esophagitis: Secondary | ICD-10-CM

## 2023-11-30 MED ORDER — QUETIAPINE FUMARATE 25 MG PO TABS: 25.0000 mg | ORAL_TABLET | Freq: Every day | ORAL | 0 refills | Status: DC

## 2023-11-30 MED ORDER — CYANOCOBALAMIN 1000 MCG/ML IJ SOLN
1000.0000 ug | Freq: Once | INTRAMUSCULAR | Status: AC
Start: 2023-11-30 — End: 2023-11-30
  Administered 2023-11-30: 1000 ug via INTRAMUSCULAR

## 2023-11-30 MED ORDER — BUSPIRONE HCL 5 MG PO TABS
5.0000 mg | ORAL_TABLET | Freq: Three times a day (TID) | ORAL | 0 refills | Status: DC
Start: 2023-11-30 — End: 2024-01-02

## 2023-11-30 MED ORDER — PROMETHAZINE HCL 12.5 MG PO TABS: 12.5000 mg | ORAL_TABLET | Freq: Three times a day (TID) | ORAL | 0 refills | Status: DC | PRN

## 2023-12-07 ENCOUNTER — Encounter: Payer: Self-pay | Admitting: Licensed Practical Nurse

## 2023-12-07 ENCOUNTER — Other Ambulatory Visit (HOSPITAL_COMMUNITY)
Admission: RE | Admit: 2023-12-07 | Discharge: 2023-12-07 | Disposition: A | Discharge status: Home / Self Care | Payer: 59 | Source: Ambulatory Visit | Attending: Licensed Practical Nurse | Admitting: Licensed Practical Nurse

## 2023-12-07 ENCOUNTER — Ambulatory Visit: Payer: 59 | Admitting: Licensed Practical Nurse

## 2023-12-07 VITALS — BP 104/56 | HR 60 | Ht 65.0 in | Wt 123.9 lb

## 2023-12-07 DIAGNOSIS — N9489 Other specified conditions associated with female genital organs and menstrual cycle: Secondary | ICD-10-CM | POA: Diagnosis present

## 2023-12-07 DIAGNOSIS — Z124 Encounter for screening for malignant neoplasm of cervix: Secondary | ICD-10-CM

## 2023-12-07 DIAGNOSIS — R399 Unspecified symptoms and signs involving the genitourinary system: Secondary | ICD-10-CM | POA: Diagnosis not present

## 2023-12-07 DIAGNOSIS — Z01419 Encounter for gynecological examination (general) (routine) without abnormal findings: Secondary | ICD-10-CM | POA: Insufficient documentation

## 2023-12-07 DIAGNOSIS — N898 Other specified noninflammatory disorders of vagina: Secondary | ICD-10-CM

## 2023-12-07 LAB — POCT URINALYSIS DIPSTICK
Bilirubin, UA: NEGATIVE
Blood, UA: NEGATIVE
Glucose, UA: NEGATIVE
Ketones, UA: NEGATIVE
Leukocytes, UA: NEGATIVE
Nitrite, UA: NEGATIVE
Protein, UA: NEGATIVE
Spec Grav, UA: 1.015 (ref 1.010–1.025)
Urobilinogen, UA: 0.2 U/dL
pH, UA: 6.5 (ref 5.0–8.0)

## 2023-12-07 NOTE — Progress Notes (Signed)
Gynecology Annual Exam  PCP: Alba Cory, MD  Chief Complaint:  Chief Complaint  Patient presents with   Gynecologic Exam    History of Present Illness: Patient is a 52 y.o. G0P0000 presents for annual exam. The patient reports:  Concern for yeast infection or possible UTI: has had vaginal burning x week, denies dysuria, has had increased frequency and some urgency. Last time she had this symptom she "had a bacterial infection". Denies any changes to her discharge  Vaginal dryness: has been happening for years, it is noticeable with IC. She does not want to use Estrogen. Her friend was recently diagnosed with cancer and was told the cancer was caused by her estrogen replacement.   LMP: Patient's last menstrual period was 07/11/2017 (exact date). Pt had Hysterectomy, has ovaries    The patient is sexually active. She currently uses status post hysterectomy for contraception. She has dyspareunia.  The patient does perform self breast exams.  There is no notable family history of breast or ovarian cancer in her family.  The patient wears seatbelts: yes.   The patient has regular exercise: yes.  treadmill  The patient reports current symptoms of depression.  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  Lives with her partner of 20 years, feels safe PCP last seen this month, has PE in February Dentist up to date Eye exam scheduled Gets Vit D and Calcium through diet   Review of Systems: ROS see HPI  Past Medical History:  Patient Active Problem List   Diagnosis Date Noted Date Diagnosed   Migraine with aura and without status migrainosus, not intractable 09/30/2022    Psychophysiological insomnia 09/30/2022    Perennial allergic rhinitis with seasonal variation 09/30/2022    Spasm of muscle of lower back 09/30/2022     refill on muscle relaxers that has been effective in the past per PCP and chart review    Age-related nuclear  cataract of right eye 01/19/2021    Corneal scar, right eye 01/19/2021    Moderate episode of recurrent major depressive disorder (HCC) 12/20/2018    Iron deficiency anemia 12/19/2018    Type A blood, Rh positive 08/22/2017    S/P vaginal hysterectomy 07/31/2017     Pathology: Uterine fibroids    Recurrent vaginitis 01/10/2017    Hemorrhoids, external without complications 09/26/2016    Nausea in adult patient 02/24/2016    Endometriosis determined by laparoscopy 01/25/2016     Rt Bladder flap powder burn, excised/cauterized Lt LUS serosa powder burn, excised/cauterized    Chronic constipation 08/14/2015    Bilateral ganglion cysts of wrists 08/14/2015    Mild mitral regurgitation 05/19/2015     Echo done in 06/08/2015 for possible cardiomegaly but echo did show that only mild mitral regurgitation     B12 deficiency 05/18/2015    Asthma, moderate 05/18/2015    Anxiety 05/18/2015    Insomnia, persistent 05/18/2015    Eczema of hand 05/18/2015    Viral keratitis 05/18/2015     Dr. Earlene Plater at [facility] ( cornea specialist), status post right corneal transplant    Migraine without aura and without status migrainosus, not intractable 05/18/2015    Numerous moles 05/18/2015    Allergic rhinitis 05/18/2015    History of corneal transplant 05/18/2015     [facility] - Dr. Jacqualine Mau - may have a cornea transplant    Gastroesophageal reflux disease without esophagitis 12/16/2009     Past Surgical History:  Past Surgical History:  Procedure Laterality Date   COLONOSCOPY WITH PROPOFOL N/A 09/29/2017   Procedure: COLONOSCOPY WITH PROPOFOL;  Surgeon: Wyline Mood, MD;  Location: [facility] ENDOSCOPY;  Service: Gastroenterology;  Laterality: N/A;   CORNEAL TRANSPLANT Right 01/07/2013   ESOPHAGOGASTRODUODENOSCOPY (EGD) WITH PROPOFOL N/A 09/29/2017   Procedure: ESOPHAGOGASTRODUODENOSCOPY (EGD) WITH PROPOFOL;  Surgeon: Wyline Mood, MD;  Location: [facility] ENDOSCOPY;  Service: Gastroenterology;  Laterality: N/A;    ESOPHAGOGASTRODUODENOSCOPY (EGD) WITH PROPOFOL N/A 01/13/2023   Procedure: ESOPHAGOGASTRODUODENOSCOPY (EGD) WITH PROPOFOL;  Surgeon: Wyline Mood, MD;  Location: [facility] ENDOSCOPY;  Service: Gastroenterology;  Laterality: N/A;   EYE SURGERY     eyelid surgery Right June 19, 2015   [facility] by Dr. Mickie Kay   LAPAROSCOPIC BILATERAL SALPINGECTOMY N/A 01/25/2016   Procedure: LAPAROSCOPIC BILATERAL SALPINGECTOMY, ADHESIOLYSIS, LAPAROSCOPIC EXCISION/FULGERATION ENDOMETRIOSIS ;  Surgeon: Herold Harms, MD;  Location: [facility] ORS;  Service: Gynecology;  Laterality: N/A;   Laparoscopic excision and fulguration of endometriosis N/A    bladder flap and uterosacral ligament endometriosis, excised and cauterized   UPPER GI ENDOSCOPY  07/19/2012   VAGINAL HYSTERECTOMY N/A 07/31/2017   Procedure: HYSTERECTOMY VAGINAL;  Surgeon: Herold Harms, MD;  Location: [facility] ORS;  Service: Gynecology;  Laterality: N/A;    Gynecologic History:  Patient's last menstrual period was 07/11/2017 (exact date). Contraception: status post hysterectomy Last Pap: Results were: 2017 no abnormalities  Last mammogram: 2024 Results were: BI-RAD III, pt recommended to have diagnostic Mammo 1 year, has had  knot in her breast for a while now, they require an [place] prior to [facility] which frustrates her as the knot has not changed and she has to pay for the [place].    Obstetric History: G0P0000  Family History:  Family History  Adopted: Yes  Problem Relation Age of Onset   Hyperlipidemia Mother    Hypertension Mother    Diabetes Father    Heart failure Father 51       hear attack   Cancer Neg Hx    Breast cancer Neg Hx     Social History:  Social History   Socioeconomic History   Marital status: Single    Spouse name: Not on file   Number of children: 0   Years of education: 12   Highest education level: High school graduate  Occupational History   Occupation: textile  Tobacco Use   Smoking status: Never    Smokeless tobacco: Never  Vaping Use   Vaping status: Never Used  Substance and Sexual Activity   Alcohol use: Not Currently    Alcohol/week: 0.0 standard drinks of alcohol    Comment: occ. wine   Drug use: No   Sexual activity: Yes    Partners: Male    Birth control/protection: Surgical  Other Topics Concern   Not on file  Social History Narrative   Engaged to get married to her boyfriend of 14 years.   Social Drivers of Corporate investment banker Strain: Low Risk  (01/06/2023)   Overall Financial Resource Strain (CARDIA)    Difficulty of Paying Living Expenses: Not hard at all  Food Insecurity: No Food Insecurity (01/06/2023)   Hunger Vital Sign    Worried About Running Out of Food in the Last Year: Never true    Ran Out of Food in the Last Year: Never true  Transportation Needs: No Transportation Needs (01/06/2023)   PRAPARE - Administrator, Civil Service (Medical): No    Lack of Transportation (Non-Medical):  No  Physical Activity: Insufficiently Active (01/06/2023)   Exercise Vital Sign    Days of Exercise per Week: 1 day    Minutes of Exercise per Session: 10 min  Stress: Stress Concern Present (01/06/2023)   [employer] of Occupational Health - Occupational Stress Questionnaire    Feeling of Stress : Rather much  Social Connections: Moderately Integrated (01/06/2023)   Social Connection and Isolation Panel [NHANES]    Frequency of Communication with Friends and Family: More than three times a week    Frequency of Social Gatherings with Friends and Family: Twice a week    Attends Religious Services: More than 4 times per year    Active Member of [employer] or Organizations: No    Attends Banker Meetings: Never    Marital Status: Living with partner  Intimate Partner Violence: Not At Risk (01/06/2023)   Humiliation, Afraid, Rape, and Kick questionnaire    Fear of Current or Ex-Partner: No    Emotionally Abused: No    Physically Abused: No     Sexually Abused: No    Allergies:  Allergies  Allergen Reactions   Bactrim [Sulfamethoxazole-Trimethoprim] Nausea Only   Penicillins Nausea Only   Topamax [Topiramate] Other (See Comments)    Fatigue     Medications: Prior to Admission medications   Medication Sig Start Date End Date Taking? Authorizing Provider  albuterol (VENTOLIN HFA) 108 (90 Base) MCG/ACT inhaler Inhale 2 puffs into the lungs every 6 (six) hours as needed for wheezing or shortness of breath. 04/04/23  Yes Sowles, Danna Hefty, MD  busPIRone (BUSPAR) 5 MG tablet Take 1-3 tablets (5-15 mg total) by mouth 3 (three) times daily. 11/30/23  Yes Sowles, Danna Hefty, MD  carboxymethylcellul-glycerin (REFRESH OPTIVE) 0.5-0.9 % ophthalmic solution Frequency:PHARMDIR   Dosage:0.0     Instructions:  Note:6 x OU Dose: 0.5%-0.9% 10/01/12  Yes [provider]  cyclobenzaprine (FLEXERIL) 5 MG tablet Take 1-2 tablets (5-10 mg total) by mouth 3 (three) times daily as needed for muscle spasms. Take mainly at bedtime as may make drowsy, do not take before driving. 7/82/95  Yes Sowles, Danna Hefty, MD  fluticasone-salmeterol (ADVAIR) 100-50 MCG/ACT AEPB Inhale 1 puff into the lungs 2 (two) times daily. 04/04/23  Yes Sowles, Danna Hefty, MD  ibuprofen (ADVIL) 800 MG tablet Take 1 tablet (800 mg total) by mouth every 8 (eight) hours as needed. 02/16/23  Yes Sowles, Danna Hefty, MD  montelukast (SINGULAIR) 10 MG tablet Take 1 tablet (10 mg total) by mouth at bedtime. 04/04/23  Yes Sowles, Danna Hefty, MD  MULTIPLE VITAMIN PO Take 1 tablet by mouth daily.   Yes [provider]  Plecanatide 3 MG TABS Take 1 tablet (3 mg total) by mouth daily. 04/03/23 03/28/24 Yes Wyline Mood, MD  prednisoLONE acetate (PRED FORTE) 1 % ophthalmic suspension Place 1 drop into the right eye daily. 01/19/17  Yes [provider]  promethazine (PHENERGAN) 12.5 MG tablet Take 1 tablet (12.5 mg total) by mouth every 8 (eight) hours as needed for nausea or vomiting. 11/30/23   Yes Sowles, Danna Hefty, MD  QUEtiapine (SEROQUEL) 25 MG tablet Take 1 tablet (25 mg total) by mouth at bedtime. 11/30/23  Yes Sowles, Danna Hefty, MD  triamcinolone cream (KENALOG) 0.1 % Apply 1 Application topically 2 (two) times daily as needed. 08/07/23  Yes Sowles, Danna Hefty, MD  Ubrogepant (UBRELVY) 100 MG TABS Take 1 tablet (100 mg total) by mouth every other day. 08/07/23  Yes Sowles, Danna Hefty, MD  valACYclovir (VALTREX) 500 MG tablet Take 1 tablet  by mouth 2 (two) times daily. 11/30/15  Yes Warden Fillers, MD  cetirizine (ZYRTEC) 5 MG tablet Take by mouth. 03/15/21 08/25/22  [provider]  Estradiol (VAGIFEM) 10 MCG TABS vaginal tablet Place 1 tablet (10 mcg total) vaginally 2 (two) times a week. Patient not taking: Reported on 12/07/2023 08/07/23   Alba Cory, MD  hydrOXYzine (ATARAX) 10 MG tablet Take 1-2 tablets (10-20 mg total) by mouth 3 (three) times daily as needed. Patient not taking: Reported on 12/07/2023 04/04/23   Alba Cory, MD  lansoprazole (PREVACID) 30 MG capsule Take 1 capsule (30 mg total) by mouth daily at 12 noon. Patient not taking: Reported on 12/07/2023 06/19/23   Wyline Mood, MD  lidocaine (LIDODERM) 5 % Place 1 patch onto the skin daily. Remove & Discard patch within 12 hours or as directed by MD Patient not taking: Reported on 12/07/2023 08/07/23   Alba Cory, MD    Physical Exam Vitals: Blood pressure (!) 104/56, pulse 60, height 5\' 5"  (1.651 m), weight 123 lb 14.4 oz (56.2 kg), last menstrual period 07/11/2017.  General: NAD HEENT: normocephalic, anicteric Thyroid: no enlargement, no palpable nodules Pulmonary: No increased work of breathing, CTAB Cardiovascular: RRR, distal pulses 2+ Breast: Breast symmetrical, no tenderness, no palpable nodules or masses on right breast, left breast: 2cm firm area on outer upper quadrant, non tender, mobile not a defined shape.  no skin or nipple retraction present, no nipple discharge.  No axillary or  supraclavicular lymphadenopathy. Abdomen: NABS, soft, non-tender, non-distended.  Umbilicus without lesions.  No hepatomegaly, splenomegaly or masses palpable. No evidence of hernia  Genitourinary:  External: Normal external female genitalia.  Normal urethral meatus, normal Bartholin's and Skene's glands.    Vagina: Normal vaginal mucosa, no evidence of prolapse.  Good tone,   Cervix: Grossly normal in appearance, no bleeding, slight odor   Uterus: surgically absent  Adnexa: ovaries non-enlarged, no adnexal masses  Rectal: deferred  Lymphatic: no evidence of inguinal lymphadenopathy Extremities: no edema, erythema, or tenderness Neurologic: Grossly intact Psychiatric: mood appropriate, affect full   Assessment: 52 y.o. G0P0000 routine annual exam  Plan: Problem List Items Addressed This Visit   None Visit Diagnoses       Well woman exam    -  Primary   Relevant Orders   Cytology - PAP     Cervical cancer screening       Relevant Orders   Cytology - PAP     Vaginal burning       Relevant Orders   Cervicovaginal ancillary only       1) Mammogram - recommend yearly screening mammogram.  Mammogram Is up to date   2) STI screening  wasoffered and declined  3) ASCCP guidelines and rational discussed.  Patient opts for every 5 years screening interval  4) Contraception - the patient is currently using  status post hysterectomy.  She is happy with her current form of contraception and plans to continue  5) Colonoscopy -- Screening recommended starting at age 8 for average risk individuals, age 75 for individuals deemed at increased risk (including African Americans) and recommended to continue until age 51.  For patient age 80-85 individualized approach is recommended.  Gold standard screening is via colonoscopy, Cologuard screening is an acceptable alternative for patient unwilling or unable to undergo colonoscopy.  "Colorectal cancer screening for average?risk adults: 2018  guideline update from the American Cancer Society"CA: A Cancer Journal for Clinicians: May 10, 2017   6) Routine  healthcare maintenance including cholesterol, diabetes screening discussed managed by PCP  7) Vaginal dryness, OTC treatments reviewed   Carie Caddy, CNM  [facility] 12/07/2023, 8:55 AM

## 2023-12-08 LAB — CYTOLOGY - PAP
Adequacy: ABSENT
Chlamydia: NEGATIVE
Comment: NEGATIVE
Comment: NEGATIVE
Comment: NORMAL
Diagnosis: NEGATIVE
High risk HPV: NEGATIVE
Neisseria Gonorrhea: NEGATIVE

## 2023-12-08 LAB — CERVICOVAGINAL ANCILLARY ONLY
Bacterial Vaginitis (gardnerella): NEGATIVE
Candida Glabrata: NEGATIVE
Candida Vaginitis: NEGATIVE
Comment: NEGATIVE
Comment: NEGATIVE
Comment: NEGATIVE

## 2023-12-09 LAB — URINE CULTURE

## 2024-01-02 ENCOUNTER — Ambulatory Visit (INDEPENDENT_AMBULATORY_CARE_PROVIDER_SITE_OTHER): Payer: 59

## 2024-01-02 ENCOUNTER — Other Ambulatory Visit: Payer: Self-pay | Admitting: Family Medicine

## 2024-01-02 DIAGNOSIS — E538 Deficiency of other specified B group vitamins: Secondary | ICD-10-CM | POA: Diagnosis not present

## 2024-01-02 DIAGNOSIS — F419 Anxiety disorder, unspecified: Secondary | ICD-10-CM

## 2024-01-02 MED ORDER — CYANOCOBALAMIN 1000 MCG/ML IJ SOLN
1000.0000 ug | Freq: Once | INTRAMUSCULAR | Status: AC
  Administered 2024-01-02: 1000 ug via INTRAMUSCULAR

## 2024-01-02 NOTE — Progress Notes (Signed)
Patient is in office today for a nurse visit for B12 Injection. Patient Injection was given in the  Left deltoid. Patient tolerated injection well.

## 2024-01-03 ENCOUNTER — Other Ambulatory Visit: Payer: Self-pay | Admitting: Family Medicine

## 2024-01-03 DIAGNOSIS — G43009 Migraine without aura, not intractable, without status migrainosus: Secondary | ICD-10-CM

## 2024-01-03 MED ORDER — IBUPROFEN 800 MG PO TABS: 800.0000 mg | ORAL_TABLET | Freq: Three times a day (TID) | ORAL | 0 refills | Status: DC | PRN

## 2024-01-12 ENCOUNTER — Encounter: Payer: 59 | Admitting: Family Medicine

## 2024-01-15 ENCOUNTER — Telehealth: Payer: Self-pay | Admitting: Gastroenterology

## 2024-01-15 NOTE — Telephone Encounter (Signed)
The patient called in to schedule appointment with Dr. Tobi Bastos.

## 2024-01-16 ENCOUNTER — Ambulatory Visit: Payer: 59 | Admitting: Gastroenterology

## 2024-01-16 VITALS — BP 125/77 | HR 62 | Temp 98.1°F | Wt 124.0 lb

## 2024-01-16 DIAGNOSIS — K648 Other hemorrhoids: Secondary | ICD-10-CM

## 2024-01-16 DIAGNOSIS — K5909 Other constipation: Secondary | ICD-10-CM | POA: Diagnosis not present

## 2024-01-16 DIAGNOSIS — Z8719 Personal history of other diseases of the digestive system: Secondary | ICD-10-CM

## 2024-01-16 NOTE — Progress Notes (Signed)
 Ruel Kung MD, MRCP(U.K) [address]  [address]  [place], [place] [address]  Main: [phone]  Fax: [phone]   Primary Care Physician: Sowles, Krichna, MD  Primary Gastroenterologist:  Dr. Ruel Kung   No chief complaint on file.   HPI: Jessica Carroll is a 53 y.o. female Summary of history :   She has previously been seen at my office back in 08/2022 History of constipation.  History of bloating abdominal discomfort felt due to be a combination of GERD constipation and secondary bloating.  History of consumption of Stevia 2-3 patches a day advised to stop previously insurance did not cover Linzess  was taking MiraLAX.     09/29/2017: EGD: Normal appearance.  Colonoscopy was normal except for internal hemorrhoids.  Biopsies of stomach showed features of mild chronic gastritis negative H. pylori.   09/01/2017 right upper quadrant ultrasound showed benign-appearing cyst in the left hepatic lobe   12/24/2019 hemoglobin 12.2 g, hepatitis C antibody negative, CMP normal except a mildly elevated total bilirubin. 07/08/2022 ultrasound right upper quadrant shows steatosis. 07/01/2022 H. pylori breath test negative, hemoglobin 10.8 g with an MCV of 91 CMP normal.  B12 low borderline 271 on injections    Interval history 08/25/2022-01/16/2024  12/2022: Hb 11.7 grams  01/13/2023: EGD: esophageal candida  She is here today to see me to discuss about internal hemorrhoids.  Complains about perianal itching after a bowel movement sometimes has to strain denies any bleeding.  Denies any change in the shape of her stool.  Takes Trulance  as well as stool softeners sometimes does not have a bowel movement on a daily basis which exacerbates her hemorrhoids.  She has tried over-the-counter Preparation H. Current Outpatient Medications  Medication Sig Dispense Refill   albuterol  (VENTOLIN  HFA) 108 (90 Base) MCG/ACT inhaler Inhale 2 puffs into the lungs every 6 (six) hours as needed for wheezing  or shortness of breath. 18 g 0   busPIRone  (BUSPAR ) 5 MG tablet TAKE 1 TO 3 TABLETS (5MG  TO 15MG  TOTAL DOSE) BY MOUTH 3 TIMES DAILY 270 tablet 0   carboxymethylcellul-glycerin (REFRESH OPTIVE) 0.5-0.9 % ophthalmic solution Frequency:PHARMDIR   Dosage:0.0     Instructions:  Note:6 x OU Dose: 0.5%-0.9%     cetirizine (ZYRTEC) 5 MG tablet Take by mouth.     cyclobenzaprine  (FLEXERIL ) 5 MG tablet Take 1-2 tablets (5-10 mg total) by mouth 3 (three) times daily as needed for muscle spasms. Take mainly at bedtime as may make drowsy, do not take before driving. 90 tablet 0   Estradiol  (VAGIFEM ) 10 MCG TABS vaginal tablet Place 1 tablet (10 mcg total) vaginally 2 (two) times a week. (Patient not taking: Reported on 12/07/2023) 8 tablet 12   fluticasone -salmeterol (ADVAIR) 100-50 MCG/ACT AEPB Inhale 1 puff into the lungs 2 (two) times daily. 180 each 1   hydrOXYzine  (ATARAX ) 10 MG tablet Take 1-2 tablets (10-20 mg total) by mouth 3 (three) times daily as needed. (Patient not taking: Reported on 12/07/2023) 90 tablet 0   ibuprofen  (ADVIL ) 800 MG tablet Take 1 tablet (800 mg total) by mouth every 8 (eight) hours as needed. 90 tablet 0   lansoprazole  (PREVACID ) 30 MG capsule Take 1 capsule (30 mg total) by mouth daily at 12 noon. (Patient not taking: Reported on 12/07/2023) 90 capsule 3   lidocaine  (LIDODERM ) 5 % Place 1 patch onto the skin daily. Remove & Discard patch within 12 hours or as directed by MD (Patient not taking: Reported on 12/07/2023) 30  patch 0   montelukast  (SINGULAIR ) 10 MG tablet Take 1 tablet (10 mg total) by mouth at bedtime. 90 tablet 1   MULTIPLE VITAMIN PO Take 1 tablet by mouth daily.     Plecanatide  3 MG TABS Take 1 tablet (3 mg total) by mouth daily. 90 tablet 3   prednisoLONE acetate (PRED FORTE) 1 % ophthalmic suspension Place 1 drop into the right eye daily.     promethazine  (PHENERGAN ) 12.5 MG tablet Take 1 tablet (12.5 mg total) by mouth every 8 (eight) hours as needed for nausea  or vomiting. 20 tablet 0   QUEtiapine  (SEROQUEL ) 25 MG tablet Take 1 tablet (25 mg total) by mouth at bedtime. 30 tablet 0   triamcinolone  cream (KENALOG ) 0.1 % Apply 1 Application topically 2 (two) times daily as needed. 80 g 0   Ubrogepant  (UBRELVY ) 100 MG TABS Take 1 tablet (100 mg total) by mouth every other day. 16 tablet 5   valACYclovir  (VALTREX ) 500 MG tablet Take 1 tablet by mouth 2 (two) times daily.  0   No current facility-administered medications for this visit.    Allergies as of 01/16/2024 - Review Complete 12/07/2023  Allergen Reaction Noted   Bactrim  [sulfamethoxazole -trimethoprim ] Nausea Only 09/25/2018   Penicillins Nausea Only 03/31/2015   Topamax  [topiramate ] Other (See Comments) 10/04/2021     ROS:  General: Negative for anorexia, weight loss, fever, chills, fatigue, weakness. ENT: Negative for hoarseness, difficulty swallowing , nasal congestion. CV: Negative for chest pain, angina, palpitations, dyspnea on exertion, peripheral edema.  Respiratory: Negative for dyspnea at rest, dyspnea on exertion, cough, sputum, wheezing.  GI: See history of present illness. GU:  Negative for dysuria, hematuria, urinary incontinence, urinary frequency, nocturnal urination.  Endo: Negative for unusual weight change.    Physical Examination:   LMP 07/11/2017 (Exact Date) Comment: patient still has her ovaries  General: Well-nourished, well-developed in no acute distress.  Eyes: No icterus. Conjunctivae pink. Skin: Warm and dry, no jaundice.   Psych: Alert and cooperative, normal mood and affect.   Imaging Studies: No results found.  Assessment and Plan:   Jessica Carroll is a 53 y.o. y/o female here  to see me today for internal hemorrhoids which was seen last on her colonoscopy in 2018.  She has a history of chronic constipation treated with Trulance  in addition she has been taking stool softeners.  Most of the time she has a good bowel movement but sometimes has to  strain which is associated with perianal itching and discomfort denies any bleeding or change in shape of her stool.     Plan 1.  Discussed about conservative management of internal hemorrhoids.  I suggested to add MiraLAX to Trulance  so that she has a soft bowel movement each day.  She presently uses medicated wipes which I advised her to stop and use very soft tissue paper to clean herself gently for followed by using a bidet or a spray can to wash herself.  Pat dry with a cotton cloth.  Rest perianal area in a bathtub with warm water at night to reduce inflammation.  Do not use Preparation H more than 5 days at that time.  High-fiber diet.  Patient information provided if this does not help come back to see me to consider hemorrhoidal banding.  No red flag signs presently to consider endoscopy evaluation at this point of time    Dr Ruel Kung  MD,MRCP [facility]) Follow up in as needed

## 2024-01-22 NOTE — Progress Notes (Signed)
Name: Jessica Carroll   MRN: [id number]    DOB: 27-Jan-1971   Date:01/23/2024       Progress Note  Subjective  Chief Complaint  Chief Complaint  Patient presents with   Annual Exam    HPI  Patient presents for annual CPE.  Diet: Regular, cooks at home, eats a lot salads, not picky Exercise: 3 times a week 10 minutes - walking on the treadmill  Last Eye Exam: will schedule Last Dental Exam: completed  Flowsheet Row Office Visit from 01/23/2024 in [facility]  AUDIT-C Score 0      Depression: Phq 9 is  negative    01/23/2024    8:10 AM 11/30/2023    8:15 AM 08/07/2023    2:17 PM 07/26/2023    8:50 AM 04/04/2023    2:31 PM  Depression screen PHQ 2/9  Decreased Interest 0 0 2 0 1  Down, Depressed, Hopeless 0 0 2 0 1  PHQ - 2 Score 0 0 4 0 2  Altered sleeping  0 1 0 3  Tired, decreased energy  0 2 0 0  Change in appetite  0 2 0 0  Feeling bad or failure about yourself   0 0 0 0  Trouble concentrating  0 0 0 0  Moving slowly or fidgety/restless  0 0 0 0  Suicidal thoughts  0 0 0 0  PHQ-9 Score  0 9 0 5  Difficult doing work/chores  Not difficult at all Very difficult Not difficult at all    Hypertension: BP Readings from Last 3 Encounters:  01/23/24 120/80  01/16/24 125/77  12/07/23 (!) 104/56   Obesity: Wt Readings from Last 3 Encounters:  01/23/24 121 lb 6.4 oz (55.1 kg)  01/16/24 124 lb (56.2 kg)  12/07/23 123 lb 14.4 oz (56.2 kg)   BMI Readings from Last 3 Encounters:  01/23/24 20.20 kg/m  01/16/24 20.63 kg/m  12/07/23 20.62 kg/m     Vaccines: Shingles vaccine reviewed with the patient.   Hep C Screening: completed STD testing and prevention (HIV/chl/gon/syphilis): no concerns  Intimate partner violence: negative screen  Menstrual History: hysterectomy years ago due to endometriosis, no vaginal bleeding or issues, still follows with her gynecologist  Discussed importance of follow up if any post-menopausal bleeding: yes   Incontinence Symptoms: negative for symptoms   Breast cancer:  - Last Mammogram: 03/30/2023, will order now for this year   Osteoporosis Prevention : Discussed high calcium and vitamin D supplementation, weight bearing exercises Bone density :no - family hx of osteoporosis, will order DEXA and vitamin D levels   Cervical cancer screening: up-to-date - hx of hysterectomy, still has ovaries last pap 12/07/2023 negative  Skin cancer: Discussed monitoring for atypical lesions  Colorectal cancer:  colonoscopy 09/29/2017, repeat in 10 years  Lung cancer:  Low Dose CT Chest recommended if Age 96-80 years, 20 pack-year currently smoking OR have quit w/in 15years. Patient does not qualify for screen   ECG: 05/03/10  Advanced Care Planning: A voluntary discussion about advance care planning including the explanation and discussion of advance directives.  Discussed health care proxy and Living will, and the patient was able to identify a health care proxy as Scot Jun (mother).  Patient does not have a living will and power of attorney of health care   Patient Active Problem List   Diagnosis Date Noted   Migraine with aura and without status migrainosus, not intractable 09/30/2022   Psychophysiological insomnia 09/30/2022  Perennial allergic rhinitis with seasonal variation 09/30/2022   Spasm of muscle of lower back 09/30/2022   Age-related nuclear cataract of right eye 01/19/2021   Corneal scar, right eye 01/19/2021   Moderate episode of recurrent major depressive disorder (HCC) 12/20/2018   Iron deficiency anemia 12/19/2018   Type A blood, Rh positive 08/22/2017   S/P vaginal hysterectomy 07/31/2017   Recurrent vaginitis 01/10/2017   Hemorrhoids, external without complications 09/26/2016   Nausea in adult patient 02/24/2016   Endometriosis determined by laparoscopy 01/25/2016   Chronic constipation 08/14/2015   Bilateral ganglion cysts of wrists 08/14/2015   Mild mitral  regurgitation 05/19/2015   B12 deficiency 05/18/2015   Asthma, moderate 05/18/2015   Anxiety 05/18/2015   Insomnia, persistent 05/18/2015   Eczema of hand 05/18/2015   Viral keratitis 05/18/2015   Migraine without aura and without status migrainosus, not intractable 05/18/2015   Numerous moles 05/18/2015   Allergic rhinitis 05/18/2015   History of corneal transplant 05/18/2015   Gastroesophageal reflux disease without esophagitis 12/16/2009    Past Surgical History:  Procedure Laterality Date   COLONOSCOPY WITH PROPOFOL N/A 09/29/2017   Procedure: COLONOSCOPY WITH PROPOFOL;  Surgeon: Wyline Mood, MD;  Location: [facility] ENDOSCOPY;  Service: Gastroenterology;  Laterality: N/A;   CORNEAL TRANSPLANT Right 01/07/2013   ESOPHAGOGASTRODUODENOSCOPY (EGD) WITH PROPOFOL N/A 09/29/2017   Procedure: ESOPHAGOGASTRODUODENOSCOPY (EGD) WITH PROPOFOL;  Surgeon: Wyline Mood, MD;  Location: [facility] ENDOSCOPY;  Service: Gastroenterology;  Laterality: N/A;   ESOPHAGOGASTRODUODENOSCOPY (EGD) WITH PROPOFOL N/A 01/13/2023   Procedure: ESOPHAGOGASTRODUODENOSCOPY (EGD) WITH PROPOFOL;  Surgeon: Wyline Mood, MD;  Location: [facility] ENDOSCOPY;  Service: Gastroenterology;  Laterality: N/A;   EYE SURGERY     eyelid surgery Right June 19, 2015   [facility] by Dr. Mickie Kay   LAPAROSCOPIC BILATERAL SALPINGECTOMY N/A 01/25/2016   Procedure: LAPAROSCOPIC BILATERAL SALPINGECTOMY, ADHESIOLYSIS, LAPAROSCOPIC EXCISION/FULGERATION ENDOMETRIOSIS ;  Surgeon: Herold Harms, MD;  Location: [facility] ORS;  Service: Gynecology;  Laterality: N/A;   Laparoscopic excision and fulguration of endometriosis N/A    bladder flap and uterosacral ligament endometriosis, excised and cauterized   UPPER GI ENDOSCOPY  07/19/2012   VAGINAL HYSTERECTOMY N/A 07/31/2017   Procedure: HYSTERECTOMY VAGINAL;  Surgeon: Herold Harms, MD;  Location: [facility] ORS;  Service: Gynecology;  Laterality: N/A;    Family History  Adopted: Yes  Problem Relation Age  of Onset   Hyperlipidemia Mother    Hypertension Mother    Diabetes Father    Heart failure Father 32       hear attack   Cancer Neg Hx    Breast cancer Neg Hx     Social History   Socioeconomic History   Marital status: Single    Spouse name: Not on file   Number of children: 0   Years of education: 12   Highest education level: High school graduate  Occupational History   Occupation: textile  Tobacco Use   Smoking status: Never   Smokeless tobacco: Never  Vaping Use   Vaping status: Never Used  Substance and Sexual Activity   Alcohol use: Not Currently    Alcohol/week: 0.0 standard drinks of alcohol    Comment: occ. wine   Drug use: No   Sexual activity: Yes    Partners: Male    Birth control/protection: Surgical  Other Topics Concern   Not on file  Social History Narrative   Engaged to get married to her boyfriend of 14 years.   Social Drivers of Dispensing optician  Resource Strain: Low Risk  (01/23/2024)   Overall Financial Resource Strain (CARDIA)    Difficulty of Paying Living Expenses: Not hard at all  Food Insecurity: No Food Insecurity (01/23/2024)   Hunger Vital Sign    Worried About Running Out of Food in the Last Year: Never true    Ran Out of Food in the Last Year: Never true  Transportation Needs: No Transportation Needs (01/23/2024)   PRAPARE - Administrator, Civil Service (Medical): No    Lack of Transportation (Non-Medical): No  Physical Activity: Insufficiently Active (01/23/2024)   Exercise Vital Sign    Days of Exercise per Week: 3 days    Minutes of Exercise per Session: 10 min  Stress: No Stress Concern Present (01/23/2024)   [employer] of Occupational Health - Occupational Stress Questionnaire    Feeling of Stress : Only a little  Social Connections: Moderately Integrated (01/23/2024)   Social Connection and Isolation Panel [NHANES]    Frequency of Communication with Friends and Family: More than three times a week     Frequency of Social Gatherings with Friends and Family: More than three times a week    Attends Religious Services: More than 4 times per year    Active Member of [employer] or Organizations: No    Attends Banker Meetings: Never    Marital Status: Living with partner  Intimate Partner Violence: Not At Risk (01/23/2024)   Humiliation, Afraid, Rape, and Kick questionnaire    Fear of Current or Ex-Partner: No    Emotionally Abused: No    Physically Abused: No    Sexually Abused: No     Current Outpatient Medications:    albuterol (VENTOLIN HFA) 108 (90 Base) MCG/ACT inhaler, Inhale 2 puffs into the lungs every 6 (six) hours as needed for wheezing or shortness of breath., Disp: 18 g, Rfl: 0   busPIRone (BUSPAR) 5 MG tablet, TAKE 1 TO 3 TABLETS (5MG  TO 15MG  TOTAL DOSE) BY MOUTH 3 TIMES DAILY, Disp: 270 tablet, Rfl: 0   carboxymethylcellul-glycerin (REFRESH OPTIVE) 0.5-0.9 % ophthalmic solution, Frequency:PHARMDIR   Dosage:0.0     Instructions:  Note:6 x OU Dose: 0.5%-0.9%, Disp: , Rfl:    cetirizine (ZYRTEC) 5 MG tablet, Take by mouth., Disp: , Rfl:    cyclobenzaprine (FLEXERIL) 5 MG tablet, Take 1-2 tablets (5-10 mg total) by mouth 3 (three) times daily as needed for muscle spasms. Take mainly at bedtime as may make drowsy, do not take before driving., Disp: 90 tablet, Rfl: 0   Estradiol (VAGIFEM) 10 MCG TABS vaginal tablet, Place 1 tablet (10 mcg total) vaginally 2 (two) times a week. (Patient not taking: Reported on 12/07/2023), Disp: 8 tablet, Rfl: 12   fluticasone-salmeterol (ADVAIR) 100-50 MCG/ACT AEPB, Inhale 1 puff into the lungs 2 (two) times daily., Disp: 180 each, Rfl: 1   hydrOXYzine (ATARAX) 10 MG tablet, Take 1-2 tablets (10-20 mg total) by mouth 3 (three) times daily as needed. (Patient not taking: Reported on 12/07/2023), Disp: 90 tablet, Rfl: 0   ibuprofen (ADVIL) 800 MG tablet, Take 1 tablet (800 mg total) by mouth every 8 (eight) hours as needed., Disp: 90 tablet, Rfl:  0   lansoprazole (PREVACID) 30 MG capsule, Take 1 capsule (30 mg total) by mouth daily at 12 noon. (Patient not taking: Reported on 12/07/2023), Disp: 90 capsule, Rfl: 3   lidocaine (LIDODERM) 5 %, Place 1 patch onto the skin daily. Remove & Discard patch within 12 hours or as directed  by MD (Patient not taking: Reported on 12/07/2023), Disp: 30 patch, Rfl: 0   montelukast (SINGULAIR) 10 MG tablet, Take 1 tablet (10 mg total) by mouth at bedtime., Disp: 90 tablet, Rfl: 1   MULTIPLE VITAMIN PO, Take 1 tablet by mouth daily., Disp: , Rfl:    Plecanatide 3 MG TABS, Take 1 tablet (3 mg total) by mouth daily., Disp: 90 tablet, Rfl: 3   prednisoLONE acetate (PRED FORTE) 1 % ophthalmic suspension, Place 1 drop into the right eye daily., Disp: , Rfl:    promethazine (PHENERGAN) 12.5 MG tablet, Take 1 tablet (12.5 mg total) by mouth every 8 (eight) hours as needed for nausea or vomiting., Disp: 20 tablet, Rfl: 0   QUEtiapine (SEROQUEL) 25 MG tablet, Take 1 tablet (25 mg total) by mouth at bedtime., Disp: 30 tablet, Rfl: 0   triamcinolone cream (KENALOG) 0.1 %, Apply 1 Application topically 2 (two) times daily as needed., Disp: 80 g, Rfl: 0   Ubrogepant (UBRELVY) 100 MG TABS, Take 1 tablet (100 mg total) by mouth every other day., Disp: 16 tablet, Rfl: 5   valACYclovir (VALTREX) 500 MG tablet, Take 1 tablet by mouth 2 (two) times daily., Disp: , Rfl: 0  Allergies  Allergen Reactions   Bactrim [Sulfamethoxazole-Trimethoprim] Nausea Only   Penicillins Nausea Only   Topamax [Topiramate] Other (See Comments)    Fatigue      Review of Systems  All other systems reviewed and are negative.   Objective  Vitals:   01/23/24 0805  BP: 120/80  Pulse: 65  Resp: 16  Temp: 97.9 F (36.6 C)  TempSrc: Oral  SpO2: 99%  Weight: 121 lb 6.4 oz (55.1 kg)  Height: 5\' 5"  (1.651 m)    Body mass index is 20.2 kg/m.  Physical Exam Constitutional:      Appearance: Normal appearance.  HENT:     Head:  Normocephalic and atraumatic.     Mouth/Throat:     Mouth: Mucous membranes are moist.     Comments: Mild PND Eyes:     Extraocular Movements: Extraocular movements intact.     Conjunctiva/sclera: Conjunctivae normal.     Pupils: Pupils are equal, round, and reactive to light.  Neck:     Comments: No thyromegaly Cardiovascular:     Rate and Rhythm: Normal rate and regular rhythm.  Pulmonary:     Effort: Pulmonary effort is normal.     Breath sounds: Normal breath sounds.  Musculoskeletal:     Cervical back: No tenderness.     Right lower leg: No edema.     Left lower leg: No edema.  Lymphadenopathy:     Cervical: No cervical adenopathy.  Skin:    General: Skin is warm and dry.  Neurological:     General: No focal deficit present.     Mental Status: She is alert. Mental status is at baseline.  Psychiatric:        Mood and Affect: Mood normal.        Behavior: Behavior normal.     Last CBC Lab Results  Component Value Date   WBC 5.3 01/06/2023   HGB 11.7 01/06/2023   HCT 34.9 (L) 01/06/2023   MCV 89.7 01/06/2023   MCH 30.1 01/06/2023   RDW 12.0 01/06/2023   PLT 204 01/06/2023   Last metabolic panel Lab Results  Component Value Date   GLUCOSE 89 01/06/2023   NA 140 01/06/2023   K 4.4 01/06/2023   CL 104 01/06/2023  CO2 31 01/06/2023   BUN 13 01/06/2023   CREATININE 0.71 01/06/2023   EGFR 102 01/06/2023   CALCIUM 9.7 01/06/2023   PROT 7.0 01/06/2023   ALBUMIN 4.7 12/24/2019   LABGLOB 2.2 12/24/2019   AGRATIO 2.1 12/24/2019   BILITOT 1.1 01/06/2023   ALKPHOS 50 12/24/2019   AST 15 01/06/2023   ALT 10 01/06/2023   ANIONGAP 7 12/06/2018   Last lipids Lab Results  Component Value Date   CHOL 204 (H) 01/06/2023   HDL 70 01/06/2023   LDLCALC 118 (H) 01/06/2023   TRIG 67 01/06/2023   CHOLHDL 2.9 01/06/2023   Last hemoglobin A1c Lab Results  Component Value Date   HGBA1C 5.4 12/31/2021   Last thyroid functions Lab Results  Component Value Date    TSH 1.03 01/06/2023   Last vitamin D Lab Results  Component Value Date   VD25OH 35 08/22/2017   Last vitamin B12 and Folate Lab Results  Component Value Date   VITAMINB12 362 01/06/2023   FOLATE 18.7 01/06/2023      Assessment & Plan  1. Annual physical exam (Primary)/Lipid screening/B12 deficiency/Vitamin D deficiency/Abnormal bruising: Physical exam completed, health maintenance reviewed and annual labs ordered.   - CBC w/Diff/Platelet - COMPLETE METABOLIC PANEL WITH GFR - INR/PT - Lipid Profile - Vitamin B12 - Vitamin D (25 hydroxy) - INR/PT  2. Estrogen deficiency/Encounter for screening mammogram for malignant neoplasm of breast/Osteoporosis screening/Family history of osteoporosis: Mammogram and bone scan ordered.   - MM 3D SCREENING MAMMOGRAM BILATERAL BREAST; Future - DG Bone Density; Future   -USPSTF grade A and B recommendations reviewed with patient; age-appropriate recommendations, preventive care, screening tests, etc discussed and encouraged; healthy living encouraged; see AVS for patient education given to patient -Discussed importance of 150 minutes of physical activity weekly, eat two servings of fish weekly, eat one serving of tree nuts ( cashews, pistachios, pecans, almonds.Marland Kitchen) every other day, eat 6 servings of fruit/vegetables daily and drink plenty of water and avoid sweet beverages.   -Reviewed Health Maintenance: Yes.

## 2024-01-23 ENCOUNTER — Encounter: Payer: Self-pay | Admitting: Internal Medicine

## 2024-01-23 ENCOUNTER — Ambulatory Visit (INDEPENDENT_AMBULATORY_CARE_PROVIDER_SITE_OTHER): Payer: 59 | Admitting: Internal Medicine

## 2024-01-23 VITALS — BP 120/80 | HR 65 | Temp 97.9°F | Resp 16 | Ht 65.0 in | Wt 121.4 lb

## 2024-01-23 DIAGNOSIS — Z0001 Encounter for general adult medical examination with abnormal findings: Secondary | ICD-10-CM

## 2024-01-23 DIAGNOSIS — E2839 Other primary ovarian failure: Secondary | ICD-10-CM

## 2024-01-23 DIAGNOSIS — E538 Deficiency of other specified B group vitamins: Secondary | ICD-10-CM

## 2024-01-23 DIAGNOSIS — E559 Vitamin D deficiency, unspecified: Secondary | ICD-10-CM

## 2024-01-23 DIAGNOSIS — Z13220 Encounter for screening for lipoid disorders: Secondary | ICD-10-CM

## 2024-01-23 DIAGNOSIS — Z0000 Encounter for general adult medical examination without abnormal findings: Secondary | ICD-10-CM

## 2024-01-23 DIAGNOSIS — R233 Spontaneous ecchymoses: Secondary | ICD-10-CM

## 2024-01-23 DIAGNOSIS — Z13820 Encounter for screening for osteoporosis: Secondary | ICD-10-CM

## 2024-01-23 DIAGNOSIS — Z8262 Family history of osteoporosis: Secondary | ICD-10-CM

## 2024-01-23 DIAGNOSIS — Z1231 Encounter for screening mammogram for malignant neoplasm of breast: Secondary | ICD-10-CM

## 2024-01-24 LAB — COMPLETE METABOLIC PANEL WITH GFR
AG Ratio: 1.8 (calc) (ref 1.0–2.5)
ALT: 11 U/L (ref 6–29)
AST: 15 U/L (ref 10–35)
Albumin: 4.6 g/dL (ref 3.6–5.1)
Alkaline phosphatase (APISO): 56 U/L (ref 37–153)
BUN: 15 mg/dL (ref 7–25)
CO2: 29 mmol/L (ref 20–32)
Calcium: 9.7 mg/dL (ref 8.6–10.4)
Chloride: 104 mmol/L (ref 98–110)
Creat: 0.71 mg/dL (ref 0.50–1.03)
Globulin: 2.5 g/dL (ref 1.9–3.7)
Glucose, Bld: 92 mg/dL (ref 65–99)
Potassium: 4.2 mmol/L (ref 3.5–5.3)
Sodium: 140 mmol/L (ref 135–146)
Total Bilirubin: 0.9 mg/dL (ref 0.2–1.2)
Total Protein: 7.1 g/dL (ref 6.1–8.1)
eGFR: 102 mL/min/{1.73_m2} (ref >=60)

## 2024-01-24 LAB — CBC WITH DIFFERENTIAL/PLATELET
Absolute Lymphocytes: 1846 {cells}/uL (ref 850–3900)
Absolute Monocytes: 312 {cells}/uL (ref 200–950)
Basophils Absolute: 68 {cells}/uL (ref 0–200)
Basophils Relative: 1.3 %
Eosinophils Absolute: 68 {cells}/uL (ref 15–500)
Eosinophils Relative: 1.3 %
HCT: 34.5 % — ABNORMAL LOW (ref 35.0–45.0)
Hemoglobin: 11.2 g/dL — ABNORMAL LOW (ref 11.7–15.5)
MCH: 29.4 pg (ref 27.0–33.0)
MCHC: 32.5 g/dL (ref 32.0–36.0)
MCV: 90.6 fL (ref 80.0–100.0)
MPV: 12.7 fL — ABNORMAL HIGH (ref 7.5–12.5)
Monocytes Relative: 6 %
Neutro Abs: 2907 {cells}/uL (ref 1500–7800)
Neutrophils Relative %: 55.9 %
Platelets: 207 10*3/uL (ref 140–400)
RBC: 3.81 10*6/uL (ref 3.80–5.10)
RDW: 12.5 % (ref 11.0–15.0)
Total Lymphocyte: 35.5 %
WBC: 5.2 10*3/uL (ref 3.8–10.8)

## 2024-01-24 LAB — PROTIME-INR
INR: 1
Prothrombin Time: 10.9 s (ref 9.0–11.5)

## 2024-01-24 LAB — LIPID PANEL
Cholesterol: 214 mg/dL — ABNORMAL HIGH (ref <200)
HDL: 66 mg/dL (ref >=50)
LDL Cholesterol (Calc): 133 mg/dL — ABNORMAL HIGH
Non-HDL Cholesterol (Calc): 148 mg/dL — ABNORMAL HIGH (ref <130)
Total CHOL/HDL Ratio: 3.2 (calc) (ref <5.0)
Triglycerides: 63 mg/dL (ref <150)

## 2024-01-24 LAB — VITAMIN D 25 HYDROXY (VIT D DEFICIENCY, FRACTURES): Vit D, 25-Hydroxy: 36 ng/mL (ref 30–100)

## 2024-01-24 LAB — VITAMIN B12: Vitamin B-12: 324 pg/mL (ref 200–1100)

## 2024-01-30 ENCOUNTER — Other Ambulatory Visit: Payer: Self-pay | Admitting: Family Medicine

## 2024-01-30 DIAGNOSIS — J4540 Moderate persistent asthma, uncomplicated: Secondary | ICD-10-CM

## 2024-01-31 MED ORDER — ALBUTEROL SULFATE HFA 108 (90 BASE) MCG/ACT IN AERS: 2.0000 | INHALATION_SPRAY | Freq: Four times a day (QID) | RESPIRATORY_TRACT | 0 refills | Status: AC | PRN

## 2024-02-01 ENCOUNTER — Ambulatory Visit: Payer: Self-pay | Admitting: Family Medicine

## 2024-02-01 ENCOUNTER — Telehealth: Payer: 59

## 2024-02-01 NOTE — Telephone Encounter (Signed)
  Chief Complaint: low back pain Symptoms: pain Frequency: This morning Pertinent Negatives: Patient denies loss of bowel or bladder, weakness, difficulty urinating Disposition: [] ED /[] Urgent Care (no appt availability in office) / [x] Appointment(In office/virtual)/ []  [facility]/ [] Home Care/ [] Refused Recommended Disposition /[] [place]/ []  Follow-up with PCP Additional Notes: Patient calls reporting low back pain that began this morning. States she worked out on the treadmill and did sit ups last night which she thinks may have lead to this. Per protocol, patient to be evaluated within 4 hours. First available appointment with PCP is 02/02/24. Patient scheduled for virtual visit for today at 1700 at patient request. Care advice reviewed, patient verbalized understanding and denies further questions at this time. Alerting PCP for review.    Copied from CRM [id number]. Topic: Clinical - Red Word Triage >> Feb 01, 2024 12:35 PM Ivette P wrote: Red Word that prompted transfer to Nurse Triage: Severe Lower back Pain, Pulled something, moved a certain way. Reason for Disposition  [1] SEVERE back pain (e.g., excruciating, unable to do any normal activities) AND [2] not improved 2 hours after pain medicine  Answer Assessment - Initial Assessment Questions 1. ONSET: "When did the pain begin?"      Started this morning 2. LOCATION: "Where does it hurt?" (upper, mid or lower back)     Middle Low back 3. SEVERITY: "How bad is the pain?"  (e.g., Scale 1-10; mild, moderate, or severe)   - MILD (1-3): Doesn't interfere with normal activities.    - MODERATE (4-7): Interferes with normal activities or awakens from sleep.    - SEVERE (8-10): Excruciating pain, unable to do any normal activities.      9/10 4. PATTERN: "Is the pain constant?" (e.g., yes, no; constant, intermittent)      Comes and goes, worse with movement. 5. RADIATION: "Does the pain shoot into your legs or  somewhere else?"     Occasionally, buttocks 6. CAUSE:  "What do you think is causing the back pain?"      May have been from exercise, unsure 7. BACK OVERUSE:  "Any recent lifting of heavy objects, strenuous work or exercise?"     Treadmill, sit ups 8. MEDICINES: "What have you taken so far for the pain?" (e.g., nothing, acetaminophen, NSAIDS)     Tylenol, ibuprofen, no relief 9. NEUROLOGIC SYMPTOMS: "Do you have any weakness, numbness, or problems with bowel/bladder control?"     Denies 10. OTHER SYMPTOMS: "Do you have any other symptoms?" (e.g., fever, abdomen pain, burning with urination, blood in urine)       Denies 11. PREGNANCY: "Is there any chance you are pregnant?" "When was your last menstrual period?"       Denies  Protocols used: Back Pain-A-AH

## 2024-02-05 ENCOUNTER — Ambulatory Visit (INDEPENDENT_AMBULATORY_CARE_PROVIDER_SITE_OTHER): Payer: 59

## 2024-02-05 DIAGNOSIS — E538 Deficiency of other specified B group vitamins: Secondary | ICD-10-CM

## 2024-02-05 MED ORDER — CYANOCOBALAMIN 1000 MCG/ML IJ SOLN
1000.0000 ug | Freq: Once | INTRAMUSCULAR | Status: AC
  Administered 2024-02-05: 1000 ug via INTRAMUSCULAR

## 2024-02-05 NOTE — Progress Notes (Signed)
 Patient is in office today for a nurse visit for B12 Injection. Patient Injection was given in the  Left deltoid. Patient tolerated injection well.

## 2024-02-06 ENCOUNTER — Other Ambulatory Visit: Payer: Self-pay | Admitting: Family Medicine

## 2024-02-06 DIAGNOSIS — J4540 Moderate persistent asthma, uncomplicated: Secondary | ICD-10-CM

## 2024-03-08 ENCOUNTER — Encounter: Payer: Self-pay | Admitting: Family Medicine

## 2024-03-08 ENCOUNTER — Ambulatory Visit: Payer: 59 | Admitting: Family Medicine

## 2024-03-08 VITALS — BP 114/70 | HR 71 | Resp 16 | Ht 65.0 in | Wt 122.6 lb

## 2024-03-08 DIAGNOSIS — E538 Deficiency of other specified B group vitamins: Secondary | ICD-10-CM

## 2024-03-08 DIAGNOSIS — M549 Dorsalgia, unspecified: Secondary | ICD-10-CM

## 2024-03-08 DIAGNOSIS — J4540 Moderate persistent asthma, uncomplicated: Secondary | ICD-10-CM

## 2024-03-08 DIAGNOSIS — Z862 Personal history of diseases of the blood and blood-forming organs and certain disorders involving the immune mechanism: Secondary | ICD-10-CM

## 2024-03-08 DIAGNOSIS — G43109 Migraine with aura, not intractable, without status migrainosus: Secondary | ICD-10-CM

## 2024-03-08 DIAGNOSIS — J3089 Other allergic rhinitis: Secondary | ICD-10-CM

## 2024-03-08 DIAGNOSIS — J302 Other seasonal allergic rhinitis: Secondary | ICD-10-CM

## 2024-03-08 DIAGNOSIS — N952 Postmenopausal atrophic vaginitis: Secondary | ICD-10-CM

## 2024-03-08 DIAGNOSIS — E559 Vitamin D deficiency, unspecified: Secondary | ICD-10-CM

## 2024-03-08 MED ORDER — MONTELUKAST SODIUM 10 MG PO TABS: 10.0000 mg | ORAL_TABLET | Freq: Every day | ORAL | 1 refills | Status: DC

## 2024-03-08 MED ORDER — UBRELVY 100 MG PO TABS: 1.0000 | ORAL_TABLET | ORAL | 5 refills | Status: AC

## 2024-03-08 MED ORDER — CYANOCOBALAMIN 1000 MCG/ML IJ SOLN
1000.0000 ug | Freq: Once | INTRAMUSCULAR | Status: AC
  Administered 2024-03-08: 1000 ug via INTRAMUSCULAR

## 2024-03-08 MED ORDER — CYANOCOBALAMIN 1000 MCG/ML IJ SOLN: 1000.0000 ug | INTRAMUSCULAR | 5 refills | Status: AC

## 2024-03-08 MED ORDER — WIXELA INHUB 100-50 MCG/ACT IN AEPB: 1.0000 | INHALATION_SPRAY | Freq: Two times a day (BID) | RESPIRATORY_TRACT | 5 refills | Status: DC

## 2024-03-08 NOTE — Progress Notes (Signed)
 Name: Jessica Carroll   MRN: [id number]    DOB: March 13, 1971   Date:03/08/2024       Progress Note  Subjective  Chief Complaint  Chief Complaint  Patient presents with   Medical Management of Chronic Issues   HPI   Depression/anxiety/Insomnia: phq 9 has improved, she has tried hydroxizine, trazodone, Effexor, seroquel , melatonin for sleep. Currently taking seroquel prn only and is stable   Menopause: she weaned self off estradiol because she was having daily headache. She continues to have headache, mood swings, night sweats and irritability, vaginal dryness . We gave her Effexor but could not tolerate , she is now taking Anxiety and Stress Relief otc and is feeling better She stopped estrogen given by gyn, she states using vitamin e oil vaginally and no longer has vaginal dryness   Chronic constipation and recently diagnosed with IBS by Dr. Tobi Bastos, she is now on Trulance and it has helped with bloating and pain, she was having hemorrhoids and was advised to add miralax otc and is doing better    Asthma moderated: she has been taking Advair and singulair and seems to be controlling her symptoms. She denies wheezing, cough or SOB Continue current regiment    GERD:  She was having worsening of symptoms and some intermittent RUQ pain, plus anemia, she went to see Dr. Tobi Bastos, she was taking Dexilant now but no longer covered by insurance and was switched to Prevacid 30 mg and is responding to medication.    History of iron deficiency and B12 deficiency: Symptoms improved since she got iron infusion. Last B12 was towards low end of normal - she is back on B12 injections but too costly ,discussed getting it it twice monthly at local pharmacy and recheck iron studies this Summer    Migraine: She was initially taking [place] every other day however since frequency of migraine decreased to about once a month she has been taking either Ubrelvy or Maxalt  She could not tolerate Topamax in the past. Migraine  is associated with visual aura, episodes used to be severe associated with photophobia, phenophobia and nausea .She states episodes are at most 3-4 times per month.    Intermittent back pain: takes flexeril prn and it works well for her.     Patient Active Problem List   Diagnosis Date Noted   Migraine with aura and without status migrainosus, not intractable 09/30/2022   Psychophysiological insomnia 09/30/2022   Perennial allergic rhinitis with seasonal variation 09/30/2022   Spasm of muscle of lower back 09/30/2022   Age-related nuclear cataract of right eye 01/19/2021   Corneal scar, right eye 01/19/2021   Moderate episode of recurrent major depressive disorder (HCC) 12/20/2018   Iron deficiency anemia 12/19/2018   Type A blood, Rh positive 08/22/2017   S/P vaginal hysterectomy 07/31/2017   Recurrent vaginitis 01/10/2017   Hemorrhoids, external without complications 09/26/2016   Nausea in adult patient 02/24/2016   Endometriosis determined by laparoscopy 01/25/2016   Chronic constipation 08/14/2015   Bilateral ganglion cysts of wrists 08/14/2015   Mild mitral regurgitation 05/19/2015   B12 deficiency 05/18/2015   Asthma, moderate 05/18/2015   Anxiety 05/18/2015   Insomnia, persistent 05/18/2015   Eczema of hand 05/18/2015   Viral keratitis 05/18/2015   Migraine without aura and without status migrainosus, not intractable 05/18/2015   Numerous moles 05/18/2015   Allergic rhinitis 05/18/2015   History of corneal transplant 05/18/2015   Gastroesophageal reflux disease without esophagitis 12/16/2009    Past  Surgical History:  Procedure Laterality Date   COLONOSCOPY WITH PROPOFOL N/A 09/29/2017   Procedure: COLONOSCOPY WITH PROPOFOL;  Surgeon: Wyline Mood, MD;  Location: [facility] ENDOSCOPY;  Service: Gastroenterology;  Laterality: N/A;   CORNEAL TRANSPLANT Right 01/07/2013   ESOPHAGOGASTRODUODENOSCOPY (EGD) WITH PROPOFOL N/A 09/29/2017   Procedure: ESOPHAGOGASTRODUODENOSCOPY (EGD)  WITH PROPOFOL;  Surgeon: Wyline Mood, MD;  Location: [facility] ENDOSCOPY;  Service: Gastroenterology;  Laterality: N/A;   ESOPHAGOGASTRODUODENOSCOPY (EGD) WITH PROPOFOL N/A 01/13/2023   Procedure: ESOPHAGOGASTRODUODENOSCOPY (EGD) WITH PROPOFOL;  Surgeon: Wyline Mood, MD;  Location: [facility] ENDOSCOPY;  Service: Gastroenterology;  Laterality: N/A;   EYE SURGERY     eyelid surgery Right June 19, 2015   [facility] by Dr. Mickie Kay   LAPAROSCOPIC BILATERAL SALPINGECTOMY N/A 01/25/2016   Procedure: LAPAROSCOPIC BILATERAL SALPINGECTOMY, ADHESIOLYSIS, LAPAROSCOPIC EXCISION/FULGERATION ENDOMETRIOSIS ;  Surgeon: Herold Harms, MD;  Location: [facility] ORS;  Service: Gynecology;  Laterality: N/A;   Laparoscopic excision and fulguration of endometriosis N/A    bladder flap and uterosacral ligament endometriosis, excised and cauterized   UPPER GI ENDOSCOPY  07/19/2012   VAGINAL HYSTERECTOMY N/A 07/31/2017   Procedure: HYSTERECTOMY VAGINAL;  Surgeon: Herold Harms, MD;  Location: [facility] ORS;  Service: Gynecology;  Laterality: N/A;    Family History  Adopted: Yes  Problem Relation Age of Onset   Hyperlipidemia Mother    Hypertension Mother    Diabetes Father    Heart failure Father 24       hear attack   Cancer Neg Hx    Breast cancer Neg Hx     Social History   Tobacco Use   Smoking status: Never   Smokeless tobacco: Never  Substance Use Topics   Alcohol use: Not Currently    Alcohol/week: 0.0 standard drinks of alcohol    Comment: occ. wine     Current Outpatient Medications:    albuterol (VENTOLIN HFA) 108 (90 Base) MCG/ACT inhaler, Inhale 2 puffs into the lungs every 6 (six) hours as needed for wheezing or shortness of breath., Disp: 18 g, Rfl: 0   busPIRone (BUSPAR) 5 MG tablet, TAKE 1 TO 3 TABLETS (5MG  TO 15MG  TOTAL DOSE) BY MOUTH 3 TIMES DAILY, Disp: 270 tablet, Rfl: 0   carboxymethylcellul-glycerin (REFRESH OPTIVE) 0.5-0.9 % ophthalmic solution, Frequency:PHARMDIR   Dosage:0.0      Instructions:  Note:6 x OU Dose: 0.5%-0.9%, Disp: , Rfl:    cyclobenzaprine (FLEXERIL) 5 MG tablet, Take 1-2 tablets (5-10 mg total) by mouth 3 (three) times daily as needed for muscle spasms. Take mainly at bedtime as may make drowsy, do not take before driving., Disp: 90 tablet, Rfl: 0   fluticasone-salmeterol (WIXELA INHUB) 100-50 MCG/ACT AEPB, INHALE 1 PUFF INTO THE LUNGS TWICE A DAY AS DIRECTED, Disp: 60 each, Rfl: 1   ibuprofen (ADVIL) 800 MG tablet, Take 1 tablet (800 mg total) by mouth every 8 (eight) hours as needed., Disp: 90 tablet, Rfl: 0   lansoprazole (PREVACID) 30 MG capsule, Take 1 capsule (30 mg total) by mouth daily at 12 noon., Disp: 90 capsule, Rfl: 3   montelukast (SINGULAIR) 10 MG tablet, Take 1 tablet (10 mg total) by mouth at bedtime., Disp: 90 tablet, Rfl: 1   MULTIPLE VITAMIN PO, Take 1 tablet by mouth daily., Disp: , Rfl:    Plecanatide 3 MG TABS, Take 1 tablet (3 mg total) by mouth daily., Disp: 90 tablet, Rfl: 3   prednisoLONE acetate (PRED FORTE) 1 % ophthalmic suspension, Place 1 drop into the right eye daily.,  Disp: , Rfl:    promethazine (PHENERGAN) 12.5 MG tablet, Take 1 tablet (12.5 mg total) by mouth every 8 (eight) hours as needed for nausea or vomiting., Disp: 20 tablet, Rfl: 0   QUEtiapine (SEROQUEL) 25 MG tablet, Take 1 tablet (25 mg total) by mouth at bedtime., Disp: 30 tablet, Rfl: 0   triamcinolone cream (KENALOG) 0.1 %, Apply 1 Application topically 2 (two) times daily as needed., Disp: 80 g, Rfl: 0   Ubrogepant (UBRELVY) 100 MG TABS, Take 1 tablet (100 mg total) by mouth every other day., Disp: 16 tablet, Rfl: 5   valACYclovir (VALTREX) 500 MG tablet, Take 1 tablet by mouth 2 (two) times daily., Disp: , Rfl: 0   cetirizine (ZYRTEC) 5 MG tablet, Take by mouth., Disp: , Rfl:    Estradiol (VAGIFEM) 10 MCG TABS vaginal tablet, Place 1 tablet (10 mcg total) vaginally 2 (two) times a week. (Patient not taking: Reported on 12/07/2023), Disp: 8 tablet, Rfl: 12    hydrOXYzine (ATARAX) 10 MG tablet, Take 1-2 tablets (10-20 mg total) by mouth 3 (three) times daily as needed. (Patient not taking: Reported on 12/07/2023), Disp: 90 tablet, Rfl: 0   lidocaine (LIDODERM) 5 %, Place 1 patch onto the skin daily. Remove & Discard patch within 12 hours or as directed by MD (Patient not taking: Reported on 12/07/2023), Disp: 30 patch, Rfl: 0  Allergies  Allergen Reactions   Bactrim [Sulfamethoxazole-Trimethoprim] Nausea Only   Penicillins Nausea Only   Topamax [Topiramate] Other (See Comments)    Fatigue     I personally reviewed active problem list, medication list, allergies with the patient/caregiver today.   ROS  Ten systems reviewed and is negative except as mentioned in HPI    Objective Physical Exam Constitutional: Patient appears well-developed and well-nourished. No distress.  HEENT: head atraumatic, normocephalic, pupils equal and reactive to light, neck supple Cardiovascular: Normal rate, regular rhythm and normal heart sounds.  No murmur heard. No BLE edema. Pulmonary/Chest: Effort normal and breath sounds normal. No respiratory distress. Abdominal: Soft.  There is no tenderness. Psychiatric: Patient has a normal mood and affect. behavior is normal. Judgment and thought content normal.   Vitals:   03/08/24 1517  BP: 114/70  Pulse: 71  Resp: 16  SpO2: 97%  Weight: 122 lb 9.6 oz (55.6 kg)  Height: 5\' 5"  (1.651 m)    Body mass index is 20.4 kg/m.  Recent Results (from the past 2160 hours)  CBC w/Diff/Platelet     Status: Abnormal   Collection Time: 01/23/24  9:00 AM  Result Value Ref Range   WBC 5.2 3.8 - 10.8 Thousand/uL   RBC 3.81 3.80 - 5.10 Million/uL   Hemoglobin 11.2 (L) 11.7 - 15.5 g/dL   HCT 16.1 (L) 09.6 - 04.5 %   MCV 90.6 80.0 - 100.0 fL   MCH 29.4 27.0 - 33.0 pg   MCHC 32.5 32.0 - 36.0 g/dL    Comment: For adults, a slight decrease in the calculated MCHC value (in the range of 30 to 32 g/dL) is most likely not  clinically significant; however, it should be interpreted with caution in correlation with other red cell parameters and the patient's clinical condition.    RDW 12.5 11.0 - 15.0 %   Platelets 207 140 - 400 Thousand/uL   MPV 12.7 (H) 7.5 - 12.5 fL   Neutro Abs 2,907 1,500 - 7,800 cells/uL   Absolute Lymphocytes 1,846 850 - 3,900 cells/uL   Absolute Monocytes 312 200 -  950 cells/uL   Eosinophils Absolute 68 15 - 500 cells/uL   Basophils Absolute 68 0 - 200 cells/uL   Neutrophils Relative % 55.9 %   Total Lymphocyte 35.5 %   Monocytes Relative 6.0 %   Eosinophils Relative 1.3 %   Basophils Relative 1.3 %  COMPLETE METABOLIC PANEL WITH GFR     Status: None   Collection Time: 01/23/24  9:00 AM  Result Value Ref Range   Glucose, Bld 92 65 - 99 mg/dL    Comment: .            Fasting reference interval .    BUN 15 7 - 25 mg/dL   Creat 0.27 2.53 - 6.64 mg/dL   eGFR 403 > OR = 60 KV/QQV/9.56L8   BUN/Creatinine Ratio SEE NOTE: 6 - 22 (calc)    Comment:    Not Reported: BUN and Creatinine are within    reference range. .    Sodium 140 135 - 146 mmol/L   Potassium 4.2 3.5 - 5.3 mmol/L   Chloride 104 98 - 110 mmol/L   CO2 29 20 - 32 mmol/L   Calcium 9.7 8.6 - 10.4 mg/dL   Total Protein 7.1 6.1 - 8.1 g/dL   Albumin 4.6 3.6 - 5.1 g/dL   Globulin 2.5 1.9 - 3.7 g/dL (calc)   AG Ratio 1.8 1.0 - 2.5 (calc)   Total Bilirubin 0.9 0.2 - 1.2 mg/dL   Alkaline phosphatase (APISO) 56 37 - 153 U/L   AST 15 10 - 35 U/L   ALT 11 6 - 29 U/L  Lipid Profile     Status: Abnormal   Collection Time: 01/23/24  9:00 AM  Result Value Ref Range   Cholesterol 214 (H) <200 mg/dL   HDL 66 > OR = 50 mg/dL   Triglycerides 63 <756 mg/dL   LDL Cholesterol (Calc) 133 (H) mg/dL (calc)    Comment: Reference range: <100 . Desirable range <100 mg/dL for primary prevention;   <70 mg/dL for patients with CHD or diabetic patients  with > or = 2 CHD risk factors. Marland Kitchen LDL-C is now calculated using the  Martin-Hopkins  calculation, which is a validated novel method providing  better accuracy than the Friedewald equation in the  estimation of LDL-C.  Horald Pollen et al. Lenox Ahr. 4332;951(88): 2061-2068  (http://education.[facility].com/faq/FAQ164)    Total CHOL/HDL Ratio 3.2 <5.0 (calc)   Non-HDL Cholesterol (Calc) 148 (H) <130 mg/dL (calc)    Comment: For patients with diabetes plus 1 major ASCVD risk  factor, treating to a non-HDL-C goal of <100 mg/dL  (LDL-C of <41 mg/dL) is considered a therapeutic  option.   INR/PT     Status: None   Collection Time: 01/23/24  9:00 AM  Result Value Ref Range   INR 1.0     Comment: Reference Range                     0.9-1.1 Moderate-intensity Warfarin Therapy 2.0-3.0 Higher-intensity Warfarin Therapy   3.0-4.0  .    Prothrombin Time 10.9 9.0 - 11.5 sec    Comment: For additional information, please refer to http://education.[facility].com/faq/FAQ104 (This link is being provided for informational/ educational purposes only.)   Vitamin D (25 hydroxy)     Status: None   Collection Time: 01/23/24  9:00 AM  Result Value Ref Range   Vit D, 25-Hydroxy 36 30 - 100 ng/mL    Comment: Vitamin D Status  25-OH Vitamin D: . Deficiency:                    <20 ng/mL Insufficiency:             20 - 29 ng/mL Optimal:                 > or = 30 ng/mL . For 25-OH Vitamin D testing on patients on  D2-supplementation and patients for whom quantitation  of D2 and D3 fractions is required, the [facility](TM) 25-OH VIT D, (D2,D3), LC/MS/MS is recommended: order  code 16109 (patients >61yrs). . See Note 1 . Note 1 . For additional information, please refer to  http://education.[facility].com/faq/FAQ199  (This link is being provided for informational/ educational purposes only.)   Vitamin B12     Status: None   Collection Time: 01/23/24  9:00 AM  Result Value Ref Range   Vitamin B-12 324 200 - 1,100 pg/mL    Comment: . Please  Note: Although the reference range for vitamin B12 is [phone] pg/mL, it has been reported that between 5 and 10% of patients with values between 200 and 400 pg/mL may experience neuropsychiatric and hematologic abnormalities due to occult B12 deficiency; less than 1% of patients with values above 400 pg/mL will have symptoms. .     Diabetic Foot Exam:     PHQ2/9:    03/08/2024    3:16 PM 01/23/2024    8:10 AM 11/30/2023    8:15 AM 08/07/2023    2:17 PM 07/26/2023    8:50 AM  Depression screen PHQ 2/9  Decreased Interest 0 0 0 2 0  Down, Depressed, Hopeless 0 0 0 2 0  PHQ - 2 Score 0 0 0 4 0  Altered sleeping 0  0 1 0  Tired, decreased energy 0  0 2 0  Change in appetite 0  0 2 0  Feeling bad or failure about yourself  0  0 0 0  Trouble concentrating 0  0 0 0  Moving slowly or fidgety/restless 0  0 0 0  Suicidal thoughts 0  0 0 0  PHQ-9 Score 0  0 9 0  Difficult doing work/chores Not difficult at all  Not difficult at all Very difficult Not difficult at all    phq 9 is negative  Fall Risk:    01/23/2024    8:10 AM 11/30/2023    8:15 AM 08/07/2023    2:17 PM 07/26/2023    8:49 AM 04/04/2023    2:31 PM  Fall Risk   Falls in the past year? 0 0 0 0 0  Number falls in past yr: 0 0 0 0 0  Injury with Fall? 0 0 0 0 0  Risk for fall due to : No Fall Risks No Fall Risks No Fall Risks  No Fall Risks  Follow up Falls evaluation completed Falls prevention discussed;Education provided;Falls evaluation completed Falls prevention discussed;Education provided;Falls evaluation completed  Falls prevention discussed     Assessment & Plan  1. Moderate persistent asthma without complication (Primary)  - montelukast (SINGULAIR) 10 MG tablet; Take 1 tablet (10 mg total) by mouth at bedtime.  Dispense: 90 tablet; Refill: 1  2. Perennial allergic rhinitis with seasonal variation  - montelukast (SINGULAIR) 10 MG tablet; Take 1 tablet (10 mg total) by mouth at bedtime.  Dispense: 90  tablet; Refill: 1  3. Migraine with aura and without status migrainosus, not intractable  - Ubrogepant (UBRELVY) 100 MG  TABS; Take 1 tablet (100 mg total) by mouth every other day.  Dispense: 16 tablet; Refill: 5  4. B12 deficiency  - B12 and Folate Panel - CBC with Differential/Platelet B12 today   5. Vitamin D deficiency  Continue supplemnetatoin   7. Psychophysiological insomnia  Stable  8. Vaginal atrophy  Explained I don't have any information about vitamin E vaginally   9. Back pain without sciatica  Doing well   10. History of iron deficiency anemia  - Iron, TIBC and Ferritin Panel - CBC with Differential/Platelet

## 2024-03-15 ENCOUNTER — Ambulatory Visit: Payer: Self-pay

## 2024-03-15 ENCOUNTER — Ambulatory Visit (INDEPENDENT_AMBULATORY_CARE_PROVIDER_SITE_OTHER): Admitting: Family Medicine

## 2024-03-15 ENCOUNTER — Encounter: Payer: Self-pay | Admitting: Family Medicine

## 2024-03-15 VITALS — BP 110/72 | HR 74 | Resp 16 | Ht 65.0 in | Wt 122.6 lb

## 2024-03-15 DIAGNOSIS — M62830 Muscle spasm of back: Secondary | ICD-10-CM

## 2024-03-15 MED ORDER — LIDOCAINE 5 % EX PTCH
1.0000 | MEDICATED_PATCH | CUTANEOUS | 0 refills | Status: DC
Start: 2024-03-15 — End: 2024-10-15

## 2024-03-15 MED ORDER — BACLOFEN 10 MG PO TABS: 10.0000 mg | ORAL_TABLET | Freq: Three times a day (TID) | ORAL | 0 refills | Status: DC

## 2024-03-15 NOTE — Progress Notes (Signed)
 Name: Jessica Carroll   MRN: [id number]    DOB: 11/18/1971   Date:03/15/2024       Progress Note  Subjective  Chief Complaint  Chief Complaint  Patient presents with   Shoulder Pain    R shoulder, onset for 2 days radiating towards side of neck. Pt states may have pulled a muscle did some heavy lifting    Discussed the use of AI scribe software for clinical note transcription with the patient, who gave verbal consent to proceed.  History of Present Illness Jessica Carroll is a 53 year old female who presents with upper back and neck pain.  She has been experiencing pain in her upper back and neck for the past two days. The pain originates in the upper back and radiates to the right side of her neck. 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 The pain is sharp when she turns her head and dull and achy when she is still.  She has attempted to manage the pain with Tylenol and ibuprofen, but reports no significant relief. She takes ibuprofen as needed.  No fever, chills, or nausea. Despite the pain, she has been able to continue working, and her workplace has accommodated her condition.    Patient Active Problem List   Diagnosis Date Noted   Migraine with aura and without status migrainosus, not intractable 09/30/2022   Psychophysiological insomnia 09/30/2022   Perennial allergic rhinitis with seasonal variation 09/30/2022   Spasm of muscle of lower back 09/30/2022   Age-related nuclear cataract of right eye 01/19/2021   Corneal scar, right eye 01/19/2021   Moderate episode of recurrent major depressive disorder (HCC) 12/20/2018   Iron deficiency anemia 12/19/2018   Type A blood, Rh positive 08/22/2017   S/P vaginal hysterectomy 07/31/2017   Recurrent vaginitis 01/10/2017   Hemorrhoids, external without complications 09/26/2016   Nausea in adult patient  02/24/2016   Endometriosis determined by laparoscopy 01/25/2016   Chronic constipation 08/14/2015   Bilateral ganglion cysts of wrists 08/14/2015   Mild mitral regurgitation 05/19/2015   B12 deficiency 05/18/2015   Asthma, moderate 05/18/2015   Anxiety 05/18/2015   Insomnia, persistent 05/18/2015   Eczema of hand 05/18/2015   Viral keratitis 05/18/2015   Migraine without aura and without status migrainosus, not intractable 05/18/2015   Numerous moles 05/18/2015   Allergic rhinitis 05/18/2015   History of corneal transplant 05/18/2015   Gastroesophageal reflux disease without esophagitis 12/16/2009    Social History   Tobacco Use   Smoking status: Never   Smokeless tobacco: Never  Substance Use Topics   Alcohol use: Not Currently    Alcohol/week: 0.0 standard drinks of alcohol    Comment: occ. wine     Current Outpatient Medications:    albuterol (VENTOLIN HFA) 108 (90 Base) MCG/ACT inhaler, Inhale 2 puffs into the lungs every 6 (six) hours as needed for wheezing or shortness of breath., Disp: 18 g, Rfl: 0   busPIRone (BUSPAR) 5 MG tablet, TAKE 1 TO 3 TABLETS (5MG  TO 15MG  TOTAL DOSE) BY MOUTH 3 TIMES DAILY, Disp: 270 tablet, Rfl: 0   carboxymethylcellul-glycerin (REFRESH OPTIVE) 0.5-0.9 % ophthalmic solution, Frequency:PHARMDIR   Dosage:0.0     Instructions:  Note:6 x OU Dose: 0.5%-0.9%, Disp: , Rfl:    cyanocobalamin (VITAMIN B12) 1000 MCG/ML injection, Inject 1 mL (1,000 mcg total) into the muscle every 14 (  fourteen) days., Disp: 2 mL, Rfl: 5   cyclobenzaprine (FLEXERIL) 5 MG tablet, Take 1-2 tablets (5-10 mg total) by mouth 3 (three) times daily as needed for muscle spasms. Take mainly at bedtime as may make drowsy, do not take before driving., Disp: 90 tablet, Rfl: 0   fluticasone-salmeterol (WIXELA INHUB) 100-50 MCG/ACT AEPB, Inhale 1 puff into the lungs 2 (two) times daily., Disp: 60 each, Rfl: 5   ibuprofen (ADVIL) 800 MG tablet, Take 1 tablet (800 mg total) by mouth every  8 (eight) hours as needed., Disp: 90 tablet, Rfl: 0   lansoprazole (PREVACID) 30 MG capsule, Take 1 capsule (30 mg total) by mouth daily at 12 noon., Disp: 90 capsule, Rfl: 3   montelukast (SINGULAIR) 10 MG tablet, Take 1 tablet (10 mg total) by mouth at bedtime., Disp: 90 tablet, Rfl: 1   MULTIPLE VITAMIN PO, Take 1 tablet by mouth daily., Disp: , Rfl:    Plecanatide 3 MG TABS, Take 1 tablet (3 mg total) by mouth daily., Disp: 90 tablet, Rfl: 3   prednisoLONE acetate (PRED FORTE) 1 % ophthalmic suspension, Place 1 drop into the right eye daily., Disp: , Rfl:    promethazine (PHENERGAN) 12.5 MG tablet, Take 1 tablet (12.5 mg total) by mouth every 8 (eight) hours as needed for nausea or vomiting., Disp: 20 tablet, Rfl: 0   QUEtiapine (SEROQUEL) 25 MG tablet, Take 1 tablet (25 mg total) by mouth at bedtime., Disp: 30 tablet, Rfl: 0   triamcinolone cream (KENALOG) 0.1 %, Apply 1 Application topically 2 (two) times daily as needed., Disp: 80 g, Rfl: 0   Ubrogepant (UBRELVY) 100 MG TABS, Take 1 tablet (100 mg total) by mouth every other day., Disp: 16 tablet, Rfl: 5   valACYclovir (VALTREX) 500 MG tablet, Take 1 tablet by mouth 2 (two) times daily., Disp: , Rfl: 0   cetirizine (ZYRTEC) 5 MG tablet, Take by mouth., Disp: , Rfl:   Allergies  Allergen Reactions   Bactrim [Sulfamethoxazole-Trimethoprim] Nausea Only   Penicillins Nausea Only   Topamax [Topiramate] Other (See Comments)    Fatigue     ROS  Ten systems reviewed and is negative except as mentioned in HPI    Objective  Vitals:   03/15/24 1251  BP: 110/72  Pulse: 74  Resp: 16  SpO2: 99%  Weight: 122 lb 9.6 oz (55.6 kg)  Height: 5\' 5"  (1.651 m)    Body mass index is 20.4 kg/m.  Physical Exam CONSTITUTIONAL: Patient appears well-developed and well-nourished. No distress. HEENT: Head atraumatic, normocephalic, neck supple. Tenderness in right trapezius muscle extending from neck to shoulder. CARDIOVASCULAR: Normal rate,  regular rhythm and normal heart sounds. No murmur heard. No BLE edema. PULMONARY: Effort normal and breath sounds normal. No respiratory distress. ABDOMINAL: There is no tenderness or distention. MUSCULOSKELETAL: Normal gait. Without gross motor or sensory deficit. Tender during palpation of right trapezium muscle  PSYCHIATRIC: Patient has a normal mood and affect. Behavior is normal. Judgment and thought content normal. SKIN: Skin normal.  Recent Results (from the past 2160 hours)  CBC w/Diff/Platelet     Status: Abnormal   Collection Time: 01/23/24  9:00 AM  Result Value Ref Range   WBC 5.2 3.8 - 10.8 Thousand/uL   RBC 3.81 3.80 - 5.10 Million/uL   Hemoglobin 11.2 (L) 11.7 - 15.5 g/dL   HCT 96.0 (L) 45.4 - 09.8 %   MCV 90.6 80.0 - 100.0 fL   MCH 29.4 27.0 - 33.0 pg  MCHC 32.5 32.0 - 36.0 g/dL    Comment: For adults, a slight decrease in the calculated MCHC value (in the range of 30 to 32 g/dL) is most likely not clinically significant; however, it should be interpreted with caution in correlation with other red cell parameters and the patient's clinical condition.    RDW 12.5 11.0 - 15.0 %   Platelets 207 140 - 400 Thousand/uL   MPV 12.7 (H) 7.5 - 12.5 fL   Neutro Abs 2,907 1,500 - 7,800 cells/uL   Absolute Lymphocytes 1,846 850 - 3,900 cells/uL   Absolute Monocytes 312 200 - 950 cells/uL   Eosinophils Absolute 68 15 - 500 cells/uL   Basophils Absolute 68 0 - 200 cells/uL   Neutrophils Relative % 55.9 %   Total Lymphocyte 35.5 %   Monocytes Relative 6.0 %   Eosinophils Relative 1.3 %   Basophils Relative 1.3 %  COMPLETE METABOLIC PANEL WITH GFR     Status: None   Collection Time: 01/23/24  9:00 AM  Result Value Ref Range   Glucose, Bld 92 65 - 99 mg/dL    Comment: .            Fasting reference interval .    BUN 15 7 - 25 mg/dL   Creat 9.32 3.55 - 7.32 mg/dL   eGFR 202 > OR = 60 RK/YHC/6.23J6   BUN/Creatinine Ratio SEE NOTE: 6 - 22 (calc)    Comment:    Not  Reported: BUN and Creatinine are within    reference range. .    Sodium 140 135 - 146 mmol/L   Potassium 4.2 3.5 - 5.3 mmol/L   Chloride 104 98 - 110 mmol/L   CO2 29 20 - 32 mmol/L   Calcium 9.7 8.6 - 10.4 mg/dL   Total Protein 7.1 6.1 - 8.1 g/dL   Albumin 4.6 3.6 - 5.1 g/dL   Globulin 2.5 1.9 - 3.7 g/dL (calc)   AG Ratio 1.8 1.0 - 2.5 (calc)   Total Bilirubin 0.9 0.2 - 1.2 mg/dL   Alkaline phosphatase (APISO) 56 37 - 153 U/L   AST 15 10 - 35 U/L   ALT 11 6 - 29 U/L  Lipid Profile     Status: Abnormal   Collection Time: 01/23/24  9:00 AM  Result Value Ref Range   Cholesterol 214 (H) <200 mg/dL   HDL 66 > OR = 50 mg/dL   Triglycerides 63 <283 mg/dL   LDL Cholesterol (Calc) 133 (H) mg/dL (calc)    Comment: Reference range: <100 . Desirable range <100 mg/dL for primary prevention;   <70 mg/dL for patients with CHD or diabetic patients  with > or = 2 CHD risk factors. Marland Kitchen LDL-C is now calculated using the Martin-Hopkins  calculation, which is a validated novel method providing  better accuracy than the Friedewald equation in the  estimation of LDL-C.  Horald Pollen et al. Lenox Ahr. 1517;616(07): 2061-2068  (http://education.[facility].com/faq/FAQ164)    Total CHOL/HDL Ratio 3.2 <5.0 (calc)   Non-HDL Cholesterol (Calc) 148 (H) <130 mg/dL (calc)    Comment: For patients with diabetes plus 1 major ASCVD risk  factor, treating to a non-HDL-C goal of <100 mg/dL  (LDL-C of <37 mg/dL) is considered a therapeutic  option.   INR/PT     Status: None   Collection Time: 01/23/24  9:00 AM  Result Value Ref Range   INR 1.0     Comment: Reference Range  0.9-1.1 Moderate-intensity Warfarin Therapy 2.0-3.0 Higher-intensity Warfarin Therapy   3.0-4.0  .    Prothrombin Time 10.9 9.0 - 11.5 sec    Comment: For additional information, please refer to http://education.[facility].com/faq/FAQ104 (This link is being provided for informational/ educational purposes  only.)   Vitamin D (25 hydroxy)     Status: None   Collection Time: 01/23/24  9:00 AM  Result Value Ref Range   Vit D, 25-Hydroxy 36 30 - 100 ng/mL    Comment: Vitamin D Status         25-OH Vitamin D: . Deficiency:                    <20 ng/mL Insufficiency:             20 - 29 ng/mL Optimal:                 > or = 30 ng/mL . For 25-OH Vitamin D testing on patients on  D2-supplementation and patients for whom quantitation  of D2 and D3 fractions is required, the [facility]ure[facility](TM) 25-OH VIT D, (D2,D3), LC/MS/MS is recommended: order  code 16109 (patients >75yrs). . See Note 1 . Note 1 . For additional information, please refer to  http://education.[facility].com/faq/FAQ199  (This link is being provided for informational/ educational purposes only.)   Vitamin B12     Status: None   Collection Time: 01/23/24  9:00 AM  Result Value Ref Range   Vitamin B-12 324 200 - 1,100 pg/mL    Comment: . Please Note: Although the reference range for vitamin B12 is [phone] pg/mL, it has been reported that between 5 and 10% of patients with values between 200 and 400 pg/mL may experience neuropsychiatric and hematologic abnormalities due to occult B12 deficiency; less than 1% of patients with values above 400 pg/mL will have symptoms. .     Assessment & Plan  Trapezius muscle strain Acute strain likely from repetitive motion or awkward sleeping. Pain localized to right trapezius, sharp with movement, dull at rest. No fever, chills, or neurological symptoms. Not definitively work-related. - Prescribed Lidoderm patch for localized numbing. Advised cutting patches into strips for affected area. Discussed cost and insurance coverage, suggested GoodRx for savings. - Prescribed ibuprofen for pain management. - Prescribed baclofen as a muscle relaxant, to be taken at night due to drowsiness.  Work-related musculoskeletal pain Musculoskeletal pain potentially related to repetitive  motion and lifting at work. Pain not linked to a specific work incident, complicating confirmation as work-related. - Advised reporting to HR if pain is suspected to be work-related.

## 2024-03-15 NOTE — Telephone Encounter (Signed)
 Copied from CRM [id number]. Topic: Clinical - Red Word Triage >> Mar 15, 2024  7:34 AM Jessica Carroll wrote: Red Word that prompted transfer to Nurse Triage: patient calling in, right shoulder pain, 7 pain scale, for 2 days, pain is now going up the side of neck, wanting to make an appt.  Chief Complaint: right shoulder pain/injury; states pulled muscle Symptoms: see above Frequency: started two days ago Pertinent Negatives: Patient denies numbness, tingling, loss of sensation, open skin Disposition: [] ED /[] Urgent Care (no appt availability in office) / [x] Appointment(In office/virtual)/ []  [facility]/ [] Home Care/ [] Refused Recommended Disposition /[] [facility]/ []  Follow-up with PCP Additional Notes: per protocol apt made for today; care advice given, denies questions; instructed to go to ER if becomes worse.   Reason for Disposition  [1] SEVERE pain AND [2] not improved 2 hours after pain medicine/ice packs  Answer Assessment - Initial Assessment Questions 1. MECHANISM: "How did the injury happen?"     Pulled muscle; doesn't know what happened 2. ONSET: "When did the injury happen?" (Minutes or hours ago)      Two days ago 3. APPEARANCE of INJURY: "What does the injury look like?"      Denies swelling, redness 4. SEVERITY: "Can you move the shoulder normally?"      Two days ago 5. SIZE: For cuts, bruises, or swelling, ask: "How large is it?" (e.g., inches or centimeters;  entire joint)      denies 6. PAIN: "Is there pain?" If Yes, ask: "How bad is the pain?"   (e.g., Scale 1-10; or mild, moderate, severe)   - MILD (1-3): doesn't interfere with normal activities   - MODERATE (4-7): interferes with normal activities (e.g., work or school) or awakens from sleep   - SEVERE (8-10): excruciating pain, unable to do any normal activities, unable to move arm at all due to pain     7/10 7. TETANUS: For any breaks in the skin, ask: "When was the last tetanus booster?"      na 8. OTHER SYMPTOMS: "Do you have any other symptoms?" (e.g., loss of sensation)     Goes to neck 9. PREGNANCY: "Is there any chance you are pregnant?" "When was your last menstrual period?"     na  Protocols used: Shoulder Injury-A-AH

## 2024-04-09 ENCOUNTER — Encounter: Payer: Self-pay | Admitting: Family Medicine

## 2024-04-09 ENCOUNTER — Other Ambulatory Visit: Payer: Self-pay | Admitting: Family Medicine

## 2024-04-09 DIAGNOSIS — R928 Other abnormal and inconclusive findings on diagnostic imaging of breast: Secondary | ICD-10-CM

## 2024-04-11 ENCOUNTER — Encounter: Payer: Self-pay | Admitting: Licensed Practical Nurse

## 2024-04-12 ENCOUNTER — Other Ambulatory Visit: Payer: Self-pay | Admitting: Licensed Practical Nurse

## 2024-04-12 DIAGNOSIS — N952 Postmenopausal atrophic vaginitis: Secondary | ICD-10-CM

## 2024-04-12 MED ORDER — ESTRADIOL 0.1 MG/GM VA CREA: 0.5000 | TOPICAL_CREAM | Freq: Every day | VAGINAL | 12 refills | Status: AC

## 2024-04-15 ENCOUNTER — Ambulatory Visit: Admitting: Family Medicine

## 2024-04-15 ENCOUNTER — Encounter: Payer: Self-pay | Admitting: Family Medicine

## 2024-04-15 ENCOUNTER — Ambulatory Visit: Payer: Self-pay | Admitting: *Deleted

## 2024-04-15 VITALS — BP 110/70 | HR 84 | Resp 16 | Ht 65.0 in | Wt 122.8 lb

## 2024-04-15 DIAGNOSIS — H9201 Otalgia, right ear: Secondary | ICD-10-CM | POA: Diagnosis not present

## 2024-04-15 MED ORDER — METHYLPREDNISOLONE 4 MG PO TBPK: ORAL_TABLET | ORAL | 0 refills | Status: DC

## 2024-04-15 NOTE — Telephone Encounter (Signed)
  Chief Complaint: right ear pain worsening x 2 weeks  Symptoms: right ear pain, sharp pains at times since over weekend,  pain level 4-5/10, sinus drainage, glands swollen, talking louder Frequency: 2 weeks but worsening over weekend Pertinent Negatives: Patient denies fever, no dizziness no drainage Disposition: [] ED /[] Urgent Care (no appt availability in office) / [x] Appointment(In office/virtual)/ []  [facility]/ [] Home Care/ [] Refused Recommended Disposition /[] [facility]/ []  Follow-up with PCP Additional Notes:   Appt scheduled today .     Reason for Disposition  Earache  (Exceptions: brief ear pain of < 60 minutes duration, earache occurring during air travel  Answer Assessment - Initial Assessment Questions 1. LOCATION: "Which ear is involved?"     Right ear  2. ONSET: "When did the ear start hurting"      2 weeks and worsening over the weekend 3. SEVERITY: "How bad is the pain?"  (Scale 1-10; mild, moderate or severe)   - MILD (1-3): doesn't interfere with normal activities    - MODERATE (4-7): interferes with normal activities or awakens from sleep    - SEVERE (8-10): excruciating pain, unable to do any normal activities      4-5/10 4. URI SYMPTOMS: "Do you have a runny nose or cough?"     Sinus sx past 2 weeks  5. FEVER: "Do you have a fever?" If Yes, ask: "What is your temperature, how was it measured, and when did it start?"     na 6. CAUSE: "Have you been swimming recently?", "How often do you use Q-TIPS?", "Have you had any recent air travel or scuba diving?"     Na  7. OTHER SYMPTOMS: "Do you have any other symptoms?" (e.g., headache, stiff neck, dizziness, vomiting, runny nose, decreased hearing)     Right ear pain, sinus drainage, sharp pain in ear at times. Glands feel swollen , feels like she is talking loud  8. PREGNANCY: "Is there any chance you are pregnant?" "When was your last menstrual period?"     na  Protocols used:  Louie Rover

## 2024-04-15 NOTE — Progress Notes (Signed)
 Name: Jessica Carroll   MRN: [id number]    DOB: 1971/09/16   Date:04/15/2024       Progress Note  Subjective  Chief Complaint  Chief Complaint  Patient presents with   Ear Pain    R ear pain, started off like sinuses and now worst     Discussed the use of AI scribe software for clinical note transcription with the patient, who gave verbal consent to proceed.  History of Present Illness Jessica Carroll is a 53 year old female who presents with ear pain and congestion.  She has been experiencing new onset ear pain since last night, described as a sharp, shooting pain that comes and goes. The pain is intense and is accompanied by a sensation of the ear being stuffy, as if underwater, which has persisted since the onset of her symptoms two weeks ago.  Two weeks ago, she experienced symptoms consistent with a cold, including a runny nose and congestion, which have since resolved. However, she continues to experience an itchy throat and stuffy ear. No hearing loss or tinnitus, but she mentions hearing a pounding sensation when lying down.  She experienced a spontaneous epistaxis last Thursday night while in the shower, without any nose blowing. She has a history of allergies and is currently taking her allergy medication. No fever or chills.  She has been managing her symptoms with over-the-counter medications and has not sought prior medical treatment for these symptoms.    Patient Active Problem List   Diagnosis Date Noted   Migraine with aura and without status migrainosus, not intractable 09/30/2022   Psychophysiological insomnia 09/30/2022   Perennial allergic rhinitis with seasonal variation 09/30/2022   Spasm of muscle of lower back 09/30/2022   Age-related nuclear cataract of right eye 01/19/2021   Corneal scar, right eye 01/19/2021   Moderate episode of recurrent major depressive disorder (HCC) 12/20/2018   Iron deficiency anemia 12/19/2018   Type A blood, Rh positive  08/22/2017   S/P vaginal hysterectomy 07/31/2017   Recurrent vaginitis 01/10/2017   Hemorrhoids, external without complications 09/26/2016   Nausea in adult patient 02/24/2016   Endometriosis determined by laparoscopy 01/25/2016   Chronic constipation 08/14/2015   Bilateral ganglion cysts of wrists 08/14/2015   Mild mitral regurgitation 05/19/2015   B12 deficiency 05/18/2015   Asthma, moderate 05/18/2015   Anxiety 05/18/2015   Insomnia, persistent 05/18/2015   Eczema of hand 05/18/2015   Viral keratitis 05/18/2015   Migraine without aura and without status migrainosus, not intractable 05/18/2015   Numerous moles 05/18/2015   Allergic rhinitis 05/18/2015   History of corneal transplant 05/18/2015   Gastroesophageal reflux disease without esophagitis 12/16/2009    Social History   Tobacco Use   Smoking status: Never   Smokeless tobacco: Never  Substance Use Topics   Alcohol use: Not Currently    Alcohol/week: 0.0 standard drinks of alcohol    Comment: occ. wine     Current Outpatient Medications:    albuterol  (VENTOLIN  HFA) 108 (90 Base) MCG/ACT inhaler, Inhale 2 puffs into the lungs every 6 (six) hours as needed for wheezing or shortness of breath., Disp: 18 g, Rfl: 0   baclofen  (LIORESAL ) 10 MG tablet, Take 1 tablet (10 mg total) by mouth 3 (three) times daily., Disp: 30 each, Rfl: 0   busPIRone  (BUSPAR ) 5 MG tablet, TAKE 1 TO 3 TABLETS (5MG  TO 15MG  TOTAL DOSE) BY MOUTH 3 TIMES DAILY, Disp: 270 tablet, Rfl: 0   carboxymethylcellul-glycerin (REFRESH OPTIVE) 0.5-0.9 %  ophthalmic solution, Frequency:PHARMDIR   Dosage:0.0     Instructions:  Note:6 x OU Dose: 0.5%-0.9%, Disp: , Rfl:    cyanocobalamin  (VITAMIN B12) 1000 MCG/ML injection, Inject 1 mL (1,000 mcg total) into the muscle every 14 (fourteen) days., Disp: 2 mL, Rfl: 5   cyclobenzaprine  (FLEXERIL ) 5 MG tablet, Take 1-2 tablets (5-10 mg total) by mouth 3 (three) times daily as needed for muscle spasms. Take mainly at bedtime  as may make drowsy, do not take before driving., Disp: 90 tablet, Rfl: 0   estradiol  (ESTRACE  VAGINAL) 0.1 MG/GM vaginal cream, Place 0.5 Applicatorfuls vaginally at bedtime. Apply daily times 2 weeks, then taper to 1 or 2 times per week, Disp: 42.5 g, Rfl: 12   fluticasone -salmeterol (WIXELA INHUB ) 100-50 MCG/ACT AEPB, Inhale 1 puff into the lungs 2 (two) times daily., Disp: 60 each, Rfl: 5   ibuprofen  (ADVIL ) 800 MG tablet, Take 1 tablet (800 mg total) by mouth every 8 (eight) hours as needed., Disp: 90 tablet, Rfl: 0   lansoprazole  (PREVACID ) 30 MG capsule, Take 1 capsule (30 mg total) by mouth daily at 12 noon., Disp: 90 capsule, Rfl: 3   lidocaine  (LIDODERM ) 5 %, Place 1 patch onto the skin daily. Remove & Discard patch within 12 hours or as directed by MD, Disp: 30 patch, Rfl: 0   montelukast  (SINGULAIR ) 10 MG tablet, Take 1 tablet (10 mg total) by mouth at bedtime., Disp: 90 tablet, Rfl: 1   MULTIPLE VITAMIN PO, Take 1 tablet by mouth daily., Disp: , Rfl:    prednisoLONE acetate (PRED FORTE) 1 % ophthalmic suspension, Place 1 drop into the right eye daily., Disp: , Rfl:    promethazine  (PHENERGAN ) 12.5 MG tablet, Take 1 tablet (12.5 mg total) by mouth every 8 (eight) hours as needed for nausea or vomiting., Disp: 20 tablet, Rfl: 0   QUEtiapine  (SEROQUEL ) 25 MG tablet, Take 1 tablet (25 mg total) by mouth at bedtime., Disp: 30 tablet, Rfl: 0   triamcinolone  cream (KENALOG ) 0.1 %, Apply 1 Application topically 2 (two) times daily as needed., Disp: 80 g, Rfl: 0   TRULANCE  3 MG TABS, Take 1 tablet by mouth daily., Disp: , Rfl:    Ubrogepant  (UBRELVY ) 100 MG TABS, Take 1 tablet (100 mg total) by mouth every other day., Disp: 16 tablet, Rfl: 5   valACYclovir  (VALTREX ) 500 MG tablet, Take 1 tablet by mouth 2 (two) times daily., Disp: , Rfl: 0   cetirizine (ZYRTEC) 5 MG tablet, Take by mouth., Disp: , Rfl:   Allergies  Allergen Reactions   Bactrim  [Sulfamethoxazole -Trimethoprim ] Nausea Only    Penicillins Nausea Only   Topamax  [Topiramate ] Other (See Comments)    Fatigue     ROS  Ten systems reviewed and is negative except as mentioned in HPI    Objective  Vitals:   04/15/24 1316  BP: 110/70  Pulse: 84  Resp: 16  SpO2: 99%  Weight: 122 lb 12.8 oz (55.7 kg)  Height: 5\' 5"  (1.651 m)    Body mass index is 20.43 kg/m.  Physical Exam  Constitutional: Patient appears well-developed and well-nourished.  No distress.  HEENT: head atraumatic, normocephalic, pupils equal and reactive to light, ear wax small amount on right side, had to flush to be able to visualize TM - no signs of infection , neck supple, throat within normal limits except for some cobblestoning. ( Patient gave verbal consent ) no pain during palpation of TMJ Cardiovascular: Normal rate, regular rhythm and normal heart sounds.  No murmur heard. No BLE edema. Pulmonary/Chest: Effort normal and breath sounds normal. No respiratory distress. Abdominal: Soft.  There is no tenderness. Psychiatric: Patient has a normal mood and affect. behavior is normal. Judgment and thought content normal.   Recent Results (from the past 2160 hours)  CBC w/Diff/Platelet     Status: Abnormal   Collection Time: 01/23/24  9:00 AM  Result Value Ref Range   WBC 5.2 3.8 - 10.8 Thousand/uL   RBC 3.81 3.80 - 5.10 Million/uL   Hemoglobin 11.2 (L) 11.7 - 15.5 g/dL   HCT 78.2 (L) 95.6 - 21.3 %   MCV 90.6 80.0 - 100.0 fL   MCH 29.4 27.0 - 33.0 pg   MCHC 32.5 32.0 - 36.0 g/dL    Comment: For adults, a slight decrease in the calculated MCHC value (in the range of 30 to 32 g/dL) is most likely not clinically significant; however, it should be interpreted with caution in correlation with other red cell parameters and the patient's clinical condition.    RDW 12.5 11.0 - 15.0 %   Platelets 207 140 - 400 Thousand/uL   MPV 12.7 (H) 7.5 - 12.5 fL   Neutro Abs 2,907 1,500 - 7,800 cells/uL   Absolute Lymphocytes 1,846 850 - 3,900  cells/uL   Absolute Monocytes 312 200 - 950 cells/uL   Eosinophils Absolute 68 15 - 500 cells/uL   Basophils Absolute 68 0 - 200 cells/uL   Neutrophils Relative % 55.9 %   Total Lymphocyte 35.5 %   Monocytes Relative 6.0 %   Eosinophils Relative 1.3 %   Basophils Relative 1.3 %  COMPLETE METABOLIC PANEL WITH GFR     Status: None   Collection Time: 01/23/24  9:00 AM  Result Value Ref Range   Glucose, Bld 92 65 - 99 mg/dL    Comment: .            Fasting reference interval .    BUN 15 7 - 25 mg/dL   Creat 0.86 5.78 - 4.69 mg/dL   eGFR 629 > OR = 60 BM/WUX/3.24M0   BUN/Creatinine Ratio SEE NOTE: 6 - 22 (calc)    Comment:    Not Reported: BUN and Creatinine are within    reference range. .    Sodium 140 135 - 146 mmol/L   Potassium 4.2 3.5 - 5.3 mmol/L   Chloride 104 98 - 110 mmol/L   CO2 29 20 - 32 mmol/L   Calcium 9.7 8.6 - 10.4 mg/dL   Total Protein 7.1 6.1 - 8.1 g/dL   Albumin 4.6 3.6 - 5.1 g/dL   Globulin 2.5 1.9 - 3.7 g/dL (calc)   AG Ratio 1.8 1.0 - 2.5 (calc)   Total Bilirubin 0.9 0.2 - 1.2 mg/dL   Alkaline phosphatase (APISO) 56 37 - 153 U/L   AST 15 10 - 35 U/L   ALT 11 6 - 29 U/L  Lipid Profile     Status: Abnormal   Collection Time: 01/23/24  9:00 AM  Result Value Ref Range   Cholesterol 214 (H) <200 mg/dL   HDL 66 > OR = 50 mg/dL   Triglycerides 63 <102 mg/dL   LDL Cholesterol (Calc) 133 (H) mg/dL (calc)    Comment: Reference range: <100 . Desirable range <100 mg/dL for primary prevention;   <70 mg/dL for patients with CHD or diabetic patients  with > or = 2 CHD risk factors. Aaron Aas LDL-C is now calculated using the Martin-Hopkins  calculation, which is a  validated novel method providing  better accuracy than the Friedewald equation in the  estimation of LDL-C.  Melinda Sprawls et al. Erroll Heard. 4782;956(21): 2061-2068  (http://education.[facility].com/faq/FAQ164)    Total CHOL/HDL Ratio 3.2 <5.0 (calc)   Non-HDL Cholesterol (Calc) 148 (H) <130 mg/dL (calc)     Comment: For patients with diabetes plus 1 major ASCVD risk  factor, treating to a non-HDL-C goal of <100 mg/dL  (LDL-C of <30 mg/dL) is considered a therapeutic  option.   INR/PT     Status: None   Collection Time: 01/23/24  9:00 AM  Result Value Ref Range   INR 1.0     Comment: Reference Range                     0.9-1.1 Moderate-intensity Warfarin Therapy 2.0-3.0 Higher-intensity Warfarin Therapy   3.0-4.0  .    Prothrombin Time 10.9 9.0 - 11.5 sec    Comment: For additional information, please refer to http://education.[facility].com/faq/FAQ104 (This link is being provided for informational/ educational purposes only.)   Vitamin D  (25 hydroxy)     Status: None   Collection Time: 01/23/24  9:00 AM  Result Value Ref Range   Vit D, 25-Hydroxy 36 30 - 100 ng/mL    Comment: Vitamin D  Status         25-OH Vitamin D : . Deficiency:                    <20 ng/mL Insufficiency:             20 - 29 ng/mL Optimal:                 > or = 30 ng/mL . For 25-OH Vitamin D  testing on patients on  D2-supplementation and patients for whom quantitation  of D2 and D3 fractions is required, the [facility]ureD(TM) 25-OH VIT D, (D2,D3), LC/MS/MS is recommended: order  code 86578 (patients >54yrs). . See Note 1 . Note 1 . For additional information, please refer to  http://education.[facility].com/faq/FAQ199  (This link is being provided for informational/ educational purposes only.)   Vitamin B12     Status: None   Collection Time: 01/23/24  9:00 AM  Result Value Ref Range   Vitamin B-12 324 200 - 1,100 pg/mL    Comment: . Please Note: Although the reference range for vitamin B12 is [phone] pg/mL, it has been reported that between 5 and 10% of patients with values between 200 and 400 pg/mL may experience neuropsychiatric and hematologic abnormalities due to occult B12 deficiency; less than 1% of patients with values above 400 pg/mL will have symptoms. .       Assessment & Plan Ear pain and congestion New onset ear pain with congestion, sharp and intermittent. No hearing loss or tinnitus. Tympanic membrane normal, persistent congestion sensation. - Prescribed Medrol  Dosepak for inflammation. - Advised to contact if no improvement for potential ENT referral.  Allergic rhinitis Allergic rhinitis with itchy throat. Taking allergy medication. Cobblestoning in throat.  Epistaxis Recent spontaneous epistaxis, no active bleeding. Nasal mucosa boggy and swollen but no bleeding  Ear lavage Discussed ear lavage to improve tympanic membrane visualization. Verbal consent given  - Performed ear lavage with warm water to prevent dizziness.

## 2024-04-16 ENCOUNTER — Encounter: Payer: Self-pay | Admitting: Gastroenterology

## 2024-04-16 ENCOUNTER — Other Ambulatory Visit: Payer: Self-pay

## 2024-04-16 MED ORDER — TRULANCE 3 MG PO TABS: 1.0000 | ORAL_TABLET | Freq: Every day | ORAL | 3 refills | Status: AC

## 2024-04-18 ENCOUNTER — Other Ambulatory Visit: Payer: Self-pay | Admitting: Family Medicine

## 2024-04-18 ENCOUNTER — Encounter: Payer: Self-pay | Admitting: Family Medicine

## 2024-04-18 DIAGNOSIS — H938X1 Other specified disorders of right ear: Secondary | ICD-10-CM

## 2024-04-19 ENCOUNTER — Other Ambulatory Visit

## 2024-04-19 ENCOUNTER — Encounter

## 2024-05-01 ENCOUNTER — Other Ambulatory Visit: Payer: Self-pay

## 2024-05-01 ENCOUNTER — Encounter: Payer: Self-pay | Admitting: Gastroenterology

## 2024-05-01 MED ORDER — LANSOPRAZOLE 30 MG PO CPDR: 30.0000 mg | DELAYED_RELEASE_CAPSULE | Freq: Every day | ORAL | 3 refills | Status: DC

## 2024-05-03 ENCOUNTER — Ambulatory Visit: Payer: Self-pay

## 2024-05-03 NOTE — Telephone Encounter (Signed)
  Chief Complaint: lump  Symptoms: lump under right rib cage Frequency: x 1 day Pertinent Negatives: Patient denies pain Disposition: [] ED /[] Urgent Care (no appt availability in office) / [x] Appointment(In office/virtual)/ []  [facility]/ [x] Home Care/ [] Refused Recommended Disposition /[] [facility]/ []  Follow-up with PCP Additional Notes: pt states that she has a knot under the right side abd under her rib cage. States its about the size of half dollar. States she noticed it last night when getting out the shower.  Denies redness or pain. States that is under the skin and can't be noticed from looking. Pt wants an appt just to get it checked out.   Copied from CRM [id number]. Topic: Clinical - Red Word Triage >> May 03, 2024 11:39 AM Jessica Carroll wrote: Red Word that prompted transfer to Nurse Triage: patient has a swollen knot underneath her right rib cage Reason for Disposition  [1] Small swelling or lump AND [2] unexplained AND [3] present < 1 week  Protocols used: Skin Lump or Localized Swelling-A-AH

## 2024-05-08 ENCOUNTER — Ambulatory Visit: Admitting: Family Medicine

## 2024-05-09 ENCOUNTER — Encounter: Payer: Self-pay | Admitting: Internal Medicine

## 2024-05-09 ENCOUNTER — Other Ambulatory Visit: Payer: Self-pay

## 2024-05-09 ENCOUNTER — Ambulatory Visit: Admitting: Internal Medicine

## 2024-05-09 ENCOUNTER — Other Ambulatory Visit (HOSPITAL_COMMUNITY)
Admission: RE | Admit: 2024-05-09 | Discharge: 2024-05-09 | Disposition: A | Discharge status: Home / Self Care | Source: Ambulatory Visit | Attending: Internal Medicine | Admitting: Internal Medicine

## 2024-05-09 VITALS — BP 120/78 | HR 80 | Temp 97.9°F | Resp 16 | Ht 65.0 in | Wt 124.7 lb

## 2024-05-09 DIAGNOSIS — Z01419 Encounter for gynecological examination (general) (routine) without abnormal findings: Secondary | ICD-10-CM | POA: Insufficient documentation

## 2024-05-09 DIAGNOSIS — N949 Unspecified condition associated with female genital organs and menstrual cycle: Secondary | ICD-10-CM | POA: Diagnosis present

## 2024-05-09 DIAGNOSIS — R351 Nocturia: Secondary | ICD-10-CM

## 2024-05-09 DIAGNOSIS — D171 Benign lipomatous neoplasm of skin and subcutaneous tissue of trunk: Secondary | ICD-10-CM

## 2024-05-09 LAB — POCT URINALYSIS DIPSTICK
Bilirubin, UA: NEGATIVE
Blood, UA: NEGATIVE
Glucose, UA: NEGATIVE
Ketones, UA: NEGATIVE
Leukocytes, UA: NEGATIVE
Nitrite, UA: NEGATIVE
Protein, UA: NEGATIVE
Spec Grav, UA: 1.02 (ref 1.010–1.025)
Urobilinogen, UA: 0.2 U/dL
pH, UA: 5 (ref 5.0–8.0)

## 2024-05-09 NOTE — Progress Notes (Signed)
   Acute Office Visit  Subjective:     Patient ID: Jessica Carroll, female    DOB: 02-09-1971, 53 y.o.   MRN: [id number]  Chief Complaint  Patient presents with   Cyst    Under right breat (rib area) for 3 days     HPI Patient is in today for cyst like structure in the RUQ area.   Discussed the use of AI scribe software for clinical note transcription with the patient, who gave verbal consent to proceed.  History of Present Illness Jessica Carroll is a 53 year old female who presents with a palpable mass and vaginal discomfort.  She discovered a soft, mobile, non-painful mass in the RUQ while drying off after a shower. The mass is palpable when standing but not when lying down, with distinct edges and no skin changes.  Vaginal discomfort with burning has been present for about a week. There is increased urinary frequency, including nocturia, but no hematuria. She has experienced similar symptoms in the past, attributed to infections.   Review of Systems  Constitutional:  Negative for chills and fever.  Gastrointestinal:  Negative for abdominal pain, nausea and vomiting.  Genitourinary:  Positive for frequency and urgency. Negative for hematuria.        Objective:    BP 120/78 (Cuff Size: Normal)   Pulse 80   Temp 97.9 F (36.6 C) (Oral)   Resp 16   Ht 5\' 5"  (1.651 m)   Wt 124 lb 11.2 oz (56.6 kg)   LMP 07/11/2017 (Exact Date) Comment: patient still has her ovaries  SpO2 99%   BMI 20.75 kg/m    Physical Exam Constitutional:      Appearance: Normal appearance.  HENT:     Head: Normocephalic and atraumatic.  Eyes:     Conjunctiva/sclera: Conjunctivae normal.  Skin:    General: Skin is warm and dry.     Comments: Soft, mobile superficial mass in the RUQ abdominal area, non-painful to palpation   Neurological:     General: No focal deficit present.     Mental Status: She is alert. Mental status is at baseline.  Psychiatric:        Mood and Affect: Mood  normal.        Behavior: Behavior normal.     No results found for any visits on 05/09/24.      Assessment & Plan:   Assessment & Plan Vaginal Discomfort Burning sensation and increased frequency suggest UTI or vaginal infection. - Urine dipstick test for UTI negative for leukocytes or nitrates.  - Vaginal swab for yeast infection, bacterial vaginosis, and trichomoniasis.  Lipoma Soft, mobile mass likely a benign lipoma. No concerning features for malignancy. - Monitor for size changes or pain. - Consider ultrasound if changes occur. - Educated on benign nature and when to seek further evaluation.  - POCT Urinalysis Dipstick - Cervicovaginal ancillary only - Urine Culture   Return if symptoms worsen or fail to improve.  Rockney Cid, DO

## 2024-05-09 NOTE — Patient Instructions (Signed)
 Lipoma  A lipoma is a noncancerous (benign) tumor that is made up of fat cells. This is a very common type of soft-tissue growth. Lipomas are usually found under the skin (subcutaneous). They may occur in any tissue of the body that contains fat. Common areas for lipomas to appear include the back, arms, shoulders, buttocks, and thighs. Lipomas grow slowly, and they are usually painless. Most lipomas do not cause problems and do not require treatment. What are the causes? The cause of this condition is not known. What increases the risk? You are more likely to develop this condition if: You are 18-42 years old. You have a family history of lipomas. What are the signs or symptoms? A lipoma usually appears as a small, round bump under the skin. In most cases, the lump will: Feel soft or rubbery. Not cause pain or other symptoms. However, if a lipoma is located in an area where it pushes on nerves, it can become painful or cause other symptoms. How is this diagnosed? A lipoma can usually be diagnosed with a physical exam. You may also have tests to confirm the diagnosis and to rule out other conditions. Tests may include: Imaging tests, such as a CT scan or an MRI. Removal of a tissue sample to be looked at under a microscope (biopsy). How is this treated? Treatment for this condition depends on the size of the lipoma and whether it is causing any symptoms. For small lipomas that are not causing problems, no treatment is needed. If a lipoma is bigger or it causes problems, surgery may be done to remove the lipoma. Lipomas can also be removed to improve appearance. Most often, the procedure is done after applying a medicine that numbs the area (local anesthetic). Liposuction may be done to reduce the size of the lipoma before it is removed through surgery, or it may be done to remove the lipoma. Lipomas are removed with this method to limit incision size and scarring. A liposuction tube is  inserted through a small incision into the lipoma, and the contents of the lipoma are removed through the tube with suction. Follow these instructions at home: Watch your lipoma for any changes. Keep all follow-up visits. This is important. Where to find more information OrthoInfo: orthoinfo.aaos.org Contact a health care provider if: Your lipoma becomes larger or hard. Your lipoma becomes painful, red, or increasingly swollen. These could be signs of infection or a more serious condition. Get help right away if: You develop tingling or numbness in an area near the lipoma. This could indicate that the lipoma is causing nerve damage. Summary A lipoma is a noncancerous tumor that is made up of fat cells. Most lipomas do not cause problems and do not require treatment. If a lipoma is bigger or it causes problems, surgery may be done to remove the lipoma. Contact a health care provider if your lipoma becomes larger or hard, or if it becomes painful, red, or increasingly swollen. These could be signs of infection or a more serious condition. This information is not intended to replace advice given to you by your health care provider. Make sure you discuss any questions you have with your health care provider. Document Revised: 12/17/2021 Document Reviewed: 12/17/2021 Elsevier Patient Education  2024 [employer].

## 2024-05-10 ENCOUNTER — Ambulatory Visit: Payer: Self-pay | Admitting: Internal Medicine

## 2024-05-10 ENCOUNTER — Other Ambulatory Visit: Payer: Self-pay | Admitting: Internal Medicine

## 2024-05-10 ENCOUNTER — Encounter: Payer: Self-pay | Admitting: Internal Medicine

## 2024-05-10 DIAGNOSIS — B9689 Other specified bacterial agents as the cause of diseases classified elsewhere: Secondary | ICD-10-CM

## 2024-05-10 LAB — URINE CULTURE
MICRO NUMBER:: 16515786
Result:: NO GROWTH
SPECIMEN QUALITY:: ADEQUATE

## 2024-05-10 LAB — CERVICOVAGINAL ANCILLARY ONLY
Bacterial Vaginitis (gardnerella): POSITIVE — AB
Candida Glabrata: NEGATIVE
Candida Vaginitis: NEGATIVE
Chlamydia: NEGATIVE
Comment: NEGATIVE
Comment: NEGATIVE
Comment: NEGATIVE
Comment: NEGATIVE
Comment: NEGATIVE
Comment: NORMAL
Neisseria Gonorrhea: NEGATIVE
Trichomonas: NEGATIVE

## 2024-05-10 MED ORDER — METRONIDAZOLE 500 MG PO TABS: 500.0000 mg | ORAL_TABLET | Freq: Two times a day (BID) | ORAL | 0 refills | Status: AC

## 2024-05-13 ENCOUNTER — Other Ambulatory Visit: Payer: Self-pay | Admitting: Internal Medicine

## 2024-05-13 DIAGNOSIS — N949 Unspecified condition associated with female genital organs and menstrual cycle: Secondary | ICD-10-CM

## 2024-05-13 MED ORDER — FLUCONAZOLE 150 MG PO TABS: 150.0000 mg | ORAL_TABLET | Freq: Once | ORAL | 0 refills | Status: AC

## 2024-06-11 ENCOUNTER — Other Ambulatory Visit

## 2024-06-11 ENCOUNTER — Encounter

## 2024-09-06 ENCOUNTER — Ambulatory Visit: Admitting: Family Medicine

## 2024-09-09 ENCOUNTER — Other Ambulatory Visit (HOSPITAL_COMMUNITY): Payer: Self-pay

## 2024-09-09 ENCOUNTER — Telehealth: Payer: Self-pay | Admitting: Pharmacy Technician

## 2024-09-09 ENCOUNTER — Encounter: Payer: Self-pay | Admitting: Oncology

## 2024-09-09 ENCOUNTER — Encounter: Payer: Self-pay | Admitting: Family Medicine

## 2024-09-09 ENCOUNTER — Telehealth: Payer: Self-pay

## 2024-09-09 MED ORDER — LANSOPRAZOLE 30 MG PO CPDR: 30.0000 mg | DELAYED_RELEASE_CAPSULE | Freq: Every day | ORAL | 0 refills | Status: DC

## 2024-09-09 NOTE — Telephone Encounter (Signed)
 Copied from CRM #[id number]. Topic: Clinical - Medication Question >> Sep 09, 2024 11:06 AM Debby BROCKS wrote: Reason for CRM: Medication that GI doctor has prescribed: lansoprazole  (PREVACID ) 30 MG capsule Patient states that she cannot see him until December and would like to know if Dr. Glenard can take over the refill of the meds until she sees the GI again

## 2024-09-09 NOTE — Telephone Encounter (Signed)
 Pharmacy Patient Advocate Encounter   Received notification from CoverMyMeds that prior authorization for Lansoprazole  30MG  dr capsules  is required/requested.   Insurance verification completed.   The patient is insured through [facility] .   Per test claim: PA required; PA submitted to above mentioned insurance via Latent Key/confirmation #/EOC [facility] Status is pending

## 2024-09-09 NOTE — Telephone Encounter (Signed)
 Pharmacy Patient Advocate Encounter  Received notification from [employer] that Prior Authorization for Lansoprazole  has been APPROVED from 09/09/2024 to 09/09/2025   PA #/Case ID/Reference #: [id number]

## 2024-09-10 ENCOUNTER — Other Ambulatory Visit (HOSPITAL_COMMUNITY): Payer: Self-pay

## 2024-09-10 NOTE — Telephone Encounter (Signed)
 Good morning this was approved yesterday, see telephone encounter 09/09/24

## 2024-10-15 ENCOUNTER — Ambulatory Visit: Admitting: Family Medicine

## 2024-10-15 ENCOUNTER — Encounter: Payer: Self-pay | Admitting: Family Medicine

## 2024-10-15 ENCOUNTER — Other Ambulatory Visit (HOSPITAL_COMMUNITY)
Admission: RE | Admit: 2024-10-15 | Discharge: 2024-10-15 | Disposition: A | Discharge status: Home / Self Care | Source: Ambulatory Visit | Attending: Family Medicine | Admitting: Family Medicine

## 2024-10-15 VITALS — BP 110/66 | HR 73 | Resp 16 | Ht 65.0 in | Wt 124.1 lb

## 2024-10-15 DIAGNOSIS — E538 Deficiency of other specified B group vitamins: Secondary | ICD-10-CM | POA: Diagnosis not present

## 2024-10-15 DIAGNOSIS — M545 Low back pain, unspecified: Secondary | ICD-10-CM

## 2024-10-15 DIAGNOSIS — R300 Dysuria: Secondary | ICD-10-CM | POA: Diagnosis not present

## 2024-10-15 DIAGNOSIS — J4540 Moderate persistent asthma, uncomplicated: Secondary | ICD-10-CM

## 2024-10-15 DIAGNOSIS — J3089 Other allergic rhinitis: Secondary | ICD-10-CM

## 2024-10-15 DIAGNOSIS — N951 Menopausal and female climacteric states: Secondary | ICD-10-CM

## 2024-10-15 DIAGNOSIS — N898 Other specified noninflammatory disorders of vagina: Secondary | ICD-10-CM

## 2024-10-15 DIAGNOSIS — K219 Gastro-esophageal reflux disease without esophagitis: Secondary | ICD-10-CM

## 2024-10-15 DIAGNOSIS — K581 Irritable bowel syndrome with constipation: Secondary | ICD-10-CM

## 2024-10-15 DIAGNOSIS — J302 Other seasonal allergic rhinitis: Secondary | ICD-10-CM

## 2024-10-15 DIAGNOSIS — E559 Vitamin D deficiency, unspecified: Secondary | ICD-10-CM

## 2024-10-15 DIAGNOSIS — G43109 Migraine with aura, not intractable, without status migrainosus: Secondary | ICD-10-CM

## 2024-10-15 LAB — POCT URINALYSIS DIPSTICK
Bilirubin, UA: NEGATIVE
Glucose, UA: NEGATIVE
Ketones, UA: NEGATIVE
Nitrite, UA: NEGATIVE
Protein, UA: NEGATIVE
Spec Grav, UA: 1.02 (ref 1.010–1.025)
Urobilinogen, UA: 0.2 U/dL
pH, UA: 6 (ref 5.0–8.0)

## 2024-10-15 MED ORDER — BACLOFEN 10 MG PO TABS: 10.0000 mg | ORAL_TABLET | Freq: Three times a day (TID) | ORAL | 0 refills | Status: AC | PRN

## 2024-10-15 MED ORDER — IBUPROFEN 800 MG PO TABS: 800.0000 mg | ORAL_TABLET | Freq: Three times a day (TID) | ORAL | 0 refills | Status: AC | PRN

## 2024-10-15 MED ORDER — MONTELUKAST SODIUM 10 MG PO TABS: 10.0000 mg | ORAL_TABLET | Freq: Every day | ORAL | 1 refills | Status: AC

## 2024-10-15 MED ORDER — CIPROFLOXACIN HCL 250 MG PO TABS: 250.0000 mg | ORAL_TABLET | Freq: Two times a day (BID) | ORAL | 0 refills | Status: AC

## 2024-10-15 MED ORDER — FLUTICASONE-SALMETEROL 100-50 MCG/ACT IN AEPB: 1.0000 | INHALATION_SPRAY | Freq: Two times a day (BID) | RESPIRATORY_TRACT | 5 refills | Status: AC

## 2024-10-15 NOTE — Progress Notes (Unsigned)
 Name: Jessica Carroll   MRN: [id number]    DOB: 05-16-1971   Date:10/15/2024       Progress Note  Subjective  Chief Complaint  Chief Complaint  Patient presents with   Medical Management of Chronic Issues   Vaginal Discharge    White onset x1 week   Dysuria    X1 week   {HPI:32069} ***  The 10-year ASCVD risk score (Arnett DK, et al., 2019) is: 1.2%   Values used to calculate the score:     Age: 53 years     Clincally relevant sex: Female     Is Non-Hispanic African American: No     Diabetic: No     Tobacco smoker: No     Systolic Blood Pressure: 110 mmHg     Is BP treated: No     HDL Cholesterol: 66 mg/dL     Total Cholesterol: 214 mg/dL    Patient Active Problem List   Diagnosis Date Noted   Irritable bowel syndrome with constipation 10/15/2024   Vitamin D  deficiency 10/15/2024   Menopausal symptom 10/15/2024   Migraine with aura and without status migrainosus, not intractable 09/30/2022   Psychophysiological insomnia 09/30/2022   Perennial allergic rhinitis with seasonal variation 09/30/2022   Spasm of muscle of lower back 09/30/2022   Age-related nuclear cataract of right eye 01/19/2021   Corneal scar, right eye 01/19/2021   Intermittent low back pain 10/27/2020   Moderate episode of recurrent major depressive disorder (HCC) 12/20/2018   Iron deficiency anemia 12/19/2018   Type A blood, Rh positive 08/22/2017   S/P vaginal hysterectomy 07/31/2017   Recurrent vaginitis 01/10/2017   Hemorrhoids, external without complications 09/26/2016   Nausea in adult patient 02/24/2016   Endometriosis determined by laparoscopy 01/25/2016   Chronic constipation 08/14/2015   Bilateral ganglion cysts of wrists 08/14/2015   Mild mitral regurgitation 05/19/2015   B12 deficiency 05/18/2015   Asthma, moderate 05/18/2015   Anxiety 05/18/2015   Insomnia, persistent 05/18/2015   Eczema of hand 05/18/2015   Viral keratitis 05/18/2015   Migraine without aura and without status  migrainosus, not intractable 05/18/2015   Numerous moles 05/18/2015   Allergic rhinitis 05/18/2015   History of corneal transplant 05/18/2015   Gastroesophageal reflux disease without esophagitis 12/16/2009    Past Surgical History:  Procedure Laterality Date   COLONOSCOPY WITH PROPOFOL  N/A 09/29/2017   Procedure: COLONOSCOPY WITH PROPOFOL ;  Surgeon: Therisa Bi, MD;  Location: [facility] ENDOSCOPY;  Service: Gastroenterology;  Laterality: N/A;   CORNEAL TRANSPLANT Right 01/07/2013   ESOPHAGOGASTRODUODENOSCOPY (EGD) WITH PROPOFOL  N/A 09/29/2017   Procedure: ESOPHAGOGASTRODUODENOSCOPY (EGD) WITH PROPOFOL ;  Surgeon: Therisa Bi, MD;  Location: [facility] ENDOSCOPY;  Service: Gastroenterology;  Laterality: N/A;   ESOPHAGOGASTRODUODENOSCOPY (EGD) WITH PROPOFOL  N/A 01/13/2023   Procedure: ESOPHAGOGASTRODUODENOSCOPY (EGD) WITH PROPOFOL ;  Surgeon: Therisa Bi, MD;  Location: [facility] ENDOSCOPY;  Service: Gastroenterology;  Laterality: N/A;   EYE SURGERY     eyelid surgery Right June 19, 2015   [facility] by Dr. Greig Gay   LAPAROSCOPIC BILATERAL SALPINGECTOMY N/A 01/25/2016   Procedure: LAPAROSCOPIC BILATERAL SALPINGECTOMY, ADHESIOLYSIS, LAPAROSCOPIC EXCISION/FULGERATION ENDOMETRIOSIS ;  Surgeon: Gladis DELENA Dollar, MD;  Location: [facility] ORS;  Service: Gynecology;  Laterality: N/A;   Laparoscopic excision and fulguration of endometriosis N/A    bladder flap and uterosacral ligament endometriosis, excised and cauterized   UPPER GI ENDOSCOPY  07/19/2012   VAGINAL HYSTERECTOMY N/A 07/31/2017   Procedure: HYSTERECTOMY VAGINAL;  Surgeon: Dollar Gladis DELENA, MD;  Location: [facility] ORS;  Service: Gynecology;  Laterality: N/A;    Family History  Adopted: Yes  Problem Relation Age of Onset   Hyperlipidemia Mother    Hypertension Mother    Diabetes Father    Heart failure Father 72       hear attack   Cancer Neg Hx    Breast cancer Neg Hx     Social History   Tobacco Use   Smoking status: Never   Smokeless  tobacco: Never  Substance Use Topics   Alcohol use: Not Currently    Alcohol/week: 0.0 standard drinks of alcohol    Comment: occ. wine     Current Outpatient Medications:    albuterol  (VENTOLIN  HFA) 108 (90 Base) MCG/ACT inhaler, Inhale 2 puffs into the lungs every 6 (six) hours as needed for wheezing or shortness of breath., Disp: 18 g, Rfl: 0   baclofen  (LIORESAL ) 10 MG tablet, Take 1 tablet (10 mg total) by mouth 3 (three) times daily., Disp: 30 each, Rfl: 0   carboxymethylcellul-glycerin (REFRESH OPTIVE) 0.5-0.9 % ophthalmic solution, Frequency:PHARMDIR   Dosage:0.0     Instructions:  Note:6 x OU Dose: 0.5%-0.9%, Disp: , Rfl:    cetirizine (ZYRTEC) 5 MG tablet, Take by mouth., Disp: , Rfl:    cyanocobalamin  (VITAMIN B12) 1000 MCG/ML injection, Inject 1 mL (1,000 mcg total) into the muscle every 14 (fourteen) days., Disp: 2 mL, Rfl: 5   estradiol  (ESTRACE  VAGINAL) 0.1 MG/GM vaginal cream, Place 0.5 Applicatorfuls vaginally at bedtime. Apply daily times 2 weeks, then taper to 1 or 2 times per week, Disp: 42.5 g, Rfl: 12   fluticasone -salmeterol (WIXELA INHUB ) 100-50 MCG/ACT AEPB, Inhale 1 puff into the lungs 2 (two) times daily., Disp: 60 each, Rfl: 5   ibuprofen  (ADVIL ) 800 MG tablet, Take 1 tablet (800 mg total) by mouth every 8 (eight) hours as needed., Disp: 90 tablet, Rfl: 0   lansoprazole  (PREVACID ) 30 MG capsule, Take 1 capsule (30 mg total) by mouth daily at 12 noon., Disp: 90 capsule, Rfl: 0   montelukast  (SINGULAIR ) 10 MG tablet, Take 1 tablet (10 mg total) by mouth at bedtime., Disp: 90 tablet, Rfl: 1   MULTIPLE VITAMIN PO, Take 1 tablet by mouth daily., Disp: , Rfl:    Plecanatide  (TRULANCE ) 3 MG TABS, Take 1 tablet (3 mg total) by mouth daily., Disp: 90 tablet, Rfl: 3   prednisoLONE acetate (PRED FORTE) 1 % ophthalmic suspension, Place 1 drop into the right eye daily., Disp: , Rfl:    promethazine  (PHENERGAN ) 12.5 MG tablet, Take 1 tablet (12.5 mg total) by mouth every 8 (eight)  hours as needed for nausea or vomiting., Disp: 20 tablet, Rfl: 0   triamcinolone  cream (KENALOG ) 0.1 %, Apply 1 Application topically 2 (two) times daily as needed., Disp: 80 g, Rfl: 0   Ubrogepant  (UBRELVY ) 100 MG TABS, Take 1 tablet (100 mg total) by mouth every other day., Disp: 16 tablet, Rfl: 5   valACYclovir  (VALTREX ) 500 MG tablet, Take 1 tablet by mouth 2 (two) times daily., Disp: , Rfl: 0  Allergies  Allergen Reactions   Bactrim  [Sulfamethoxazole -Trimethoprim ] Nausea Only   Penicillins Nausea Only   Topamax  [Topiramate ] Other (See Comments)    Fatigue     I personally reviewed active problem list, medication list, allergies with the patient/caregiver today.   ROS  Ten systems reviewed and is negative except as mentioned in HPI    Objective Physical Exam   Vitals:   10/15/24 1425  BP: 110/66  Pulse: 73  Resp: 16  SpO2: 99%  Weight: 124 lb 1.6 oz (56.3 kg)  Height: 5' 5 (1.651 m)    Body mass index is 20.65 kg/m.  Recent Results (from the past 2160 hours)  POCT urinalysis dipstick     Status: Abnormal   Collection Time: 10/15/24  2:40 PM  Result Value Ref Range   Color, UA Yellow    Clarity, UA Cloudy    Glucose, UA Negative Negative   Bilirubin, UA Negative    Ketones, UA Negative    Spec Grav, UA 1.020 1.010 - 1.025   Blood, UA Moderate    pH, UA 6.0 5.0 - 8.0   Protein, UA Negative Negative   Urobilinogen, UA 0.2 0.2 or 1.0 E.U./dL   Nitrite, UA Negative    Leukocytes, UA Moderate (2+) (A) Negative   Appearance yellow    Odor foul       PHQ2/9:    10/15/2024    2:15 PM 04/15/2024    1:16 PM 03/08/2024    3:16 PM 01/23/2024    8:10 AM 11/30/2023    8:15 AM  Depression screen PHQ 2/9  Decreased Interest 0 0 0 0 0  Down, Depressed, Hopeless 0 0 0 0 0  PHQ - 2 Score 0 0 0 0 0  Altered sleeping 0 0 0  0  Tired, decreased energy 0 0 0  0  Change in appetite 0 0 0  0  Feeling bad or failure about yourself  0 0 0  0  Trouble concentrating 0 0 0   0  Moving slowly or fidgety/restless 0 0 0  0  Suicidal thoughts 0 0 0  0  PHQ-9 Score 0 0 0  0  Difficult doing work/chores Not difficult at all Not difficult at all Not difficult at all  Not difficult at all    phq 9 is negative  Fall Risk:    10/15/2024    2:15 PM 04/15/2024    1:13 PM 01/23/2024    8:10 AM 11/30/2023    8:15 AM 08/07/2023    2:17 PM  Fall Risk   Falls in the past year? 0 0 0 0 0  Number falls in past yr: 0 0 0 0 0  Injury with Fall? 0 0 0 0 0  Risk for fall due to : No Fall Risks No Fall Risks No Fall Risks No Fall Risks No Fall Risks  Follow up Falls evaluation completed Falls prevention discussed;Education provided;Falls evaluation completed Falls evaluation completed Falls prevention discussed;Education provided;Falls evaluation completed Falls prevention discussed;Education provided;Falls evaluation completed   Assessment & Plan

## 2024-10-16 ENCOUNTER — Ambulatory Visit: Payer: Self-pay | Admitting: Family Medicine

## 2024-10-16 ENCOUNTER — Encounter: Payer: Self-pay | Admitting: Family Medicine

## 2024-10-16 LAB — B12 AND FOLATE PANEL
Folate: 10.9 ng/mL
Vitamin B-12: 215 pg/mL (ref 200–1100)

## 2024-10-16 LAB — URINE CULTURE
MICRO NUMBER:: 17188928
Result:: NO GROWTH
SPECIMEN QUALITY:: ADEQUATE

## 2024-10-16 LAB — CBC WITH DIFFERENTIAL/PLATELET
Absolute Lymphocytes: 2548 {cells}/uL (ref 850–3900)
Absolute Monocytes: 390 {cells}/uL (ref 200–950)
Basophils Absolute: 52 {cells}/uL (ref 0–200)
Basophils Relative: 0.8 %
Eosinophils Absolute: 117 {cells}/uL (ref 15–500)
Eosinophils Relative: 1.8 %
HCT: 33.5 % — ABNORMAL LOW (ref 35.0–45.0)
Hemoglobin: 11 g/dL — ABNORMAL LOW (ref 11.7–15.5)
MCH: 30.8 pg (ref 27.0–33.0)
MCHC: 32.8 g/dL (ref 32.0–36.0)
MCV: 93.8 fL (ref 80.0–100.0)
MPV: 12.3 fL (ref 7.5–12.5)
Monocytes Relative: 6 %
Neutro Abs: 3393 {cells}/uL (ref 1500–7800)
Neutrophils Relative %: 52.2 %
Platelets: 212 Thousand/uL (ref 140–400)
RBC: 3.57 Million/uL — ABNORMAL LOW (ref 3.80–5.10)
RDW: 12.3 % (ref 11.0–15.0)
Total Lymphocyte: 39.2 %
WBC: 6.5 Thousand/uL (ref 3.8–10.8)

## 2024-10-16 LAB — IRON,TIBC AND FERRITIN PANEL
%SAT: 14 % — ABNORMAL LOW (ref 16–45)
Ferritin: 142 ng/mL (ref 16–232)
Iron: 45 ug/dL (ref 45–160)
TIBC: 317 ug/dL (ref 250–450)

## 2024-10-17 ENCOUNTER — Ambulatory Visit: Payer: Self-pay | Admitting: Family Medicine

## 2024-10-17 ENCOUNTER — Encounter: Payer: Self-pay | Admitting: Family Medicine

## 2024-10-17 ENCOUNTER — Other Ambulatory Visit (HOSPITAL_COMMUNITY): Payer: Self-pay

## 2024-10-17 ENCOUNTER — Ambulatory Visit (INDEPENDENT_AMBULATORY_CARE_PROVIDER_SITE_OTHER)

## 2024-10-17 ENCOUNTER — Telehealth: Payer: Self-pay | Admitting: Pharmacy Technician

## 2024-10-17 DIAGNOSIS — E538 Deficiency of other specified B group vitamins: Secondary | ICD-10-CM

## 2024-10-17 MED ORDER — CYANOCOBALAMIN 1000 MCG/ML IJ SOLN
1000.0000 ug | Freq: Once | INTRAMUSCULAR | Status: AC
  Administered 2024-10-17: 1000 ug via INTRAMUSCULAR

## 2024-10-17 NOTE — Telephone Encounter (Signed)
 Pharmacy Patient Advocate Encounter   Received notification from CoverMyMeds that prior authorization for Ubrelvy  100MG  tablets is due for renewal.   Insurance verification completed.   The patient is insured through [facility].  Action: PA required; PA submitted to above mentioned insurance via CoverMyMeds Key/confirmation #/EOC [id number] Status is pending

## 2024-10-17 NOTE — Progress Notes (Signed)
Patient here for b12 injection.

## 2024-10-17 NOTE — Telephone Encounter (Signed)
 Pharmacy Patient Advocate Encounter  Received notification from [facility] that Prior Authorization for Ubrelvy  100MG  tablets has been APPROVED from 10/17/24 to 10/17/25. Ran test claim, Copay is $0.00. This test claim was processed through [facility]- copay amounts may vary at other pharmacies due to pharmacy/plan contracts, or as the patient moves through the different stages of their insurance plan.   PA #/Case ID/Reference #:  [id number]

## 2024-10-18 ENCOUNTER — Other Ambulatory Visit: Payer: Self-pay | Admitting: Family Medicine

## 2024-10-18 ENCOUNTER — Encounter: Payer: Self-pay | Admitting: Family Medicine

## 2024-10-18 LAB — CERVICOVAGINAL ANCILLARY ONLY
Bacterial Vaginitis (gardnerella): POSITIVE — AB
Candida Glabrata: NEGATIVE
Candida Vaginitis: NEGATIVE
Comment: NEGATIVE
Comment: NEGATIVE
Comment: NEGATIVE
Comment: NEGATIVE
Trichomonas: NEGATIVE

## 2024-10-18 MED ORDER — METRONIDAZOLE 250 MG PO TABS: 250.0000 mg | ORAL_TABLET | Freq: Two times a day (BID) | ORAL | 0 refills | Status: AC

## 2024-10-22 ENCOUNTER — Encounter: Payer: Self-pay | Admitting: Family Medicine

## 2024-10-23 ENCOUNTER — Other Ambulatory Visit: Payer: Self-pay | Admitting: Family Medicine

## 2024-10-23 MED ORDER — FLUCONAZOLE 150 MG PO TABS: 150.0000 mg | ORAL_TABLET | ORAL | 0 refills | Status: DC

## 2024-11-20 ENCOUNTER — Ambulatory Visit: Admitting: Emergency Medicine

## 2024-11-20 DIAGNOSIS — E538 Deficiency of other specified B group vitamins: Secondary | ICD-10-CM

## 2024-11-20 MED ORDER — CYANOCOBALAMIN 1000 MCG/ML IJ SOLN
1000.0000 ug | Freq: Once | INTRAMUSCULAR | Status: AC
  Administered 2024-11-20: 1000 ug via INTRAMUSCULAR

## 2024-12-11 ENCOUNTER — Ambulatory Visit: Admitting: Licensed Practical Nurse

## 2024-12-15 NOTE — ED Provider Notes (Signed)
 Patient: Jessica Carroll MRN: [id number] Date of visit: Sun 12/15/2024  CDU Provider Note:  Care assumed from [place], PA-C at 8195. ED attending Dr. Jeananne. Received report.  History obtained from the patient and chart review. Interpreter was not needed.  Subjective Jessica Carroll is a 54 y.o. female with PMH GERD, chronic constipation, IBS and corneal transplant presenting to the Emergency Room for complaints of abdominal pain. Onset was last night. Pain is located in the RUQ without radiation. The patient reports having bloating and pain after eating for some time. She notes having seen a Gastroenterologist in the past, but not currently followed by one. The pain is described as sharp and cramping and is 8/10 currently in intensity. Symptoms have been continuous since last night since onset.  Associated symptoms: subjective chills, nausea without vomiting. Aggravating factors: eating.  Alleviating factors: nothing specific.   Review of Systems Pertinent items are noted in HPI.   Patients Past Medical History, Family History & Social History has been reviewed with the patient. Relevant PMH includes: Medical History[1] . Relevant Family History includes: Family History[2] .   Social History: Social History[3]    Medications and Allergies reviewed with patient.   Objective BP 113/64   Pulse 62   Temp 98.9 F (37.2 C) (Oral)   Resp 15   SpO2 99%  General: alert, well appearing, and in no distress. Head: Normocephalic, without obvious abnormality Eyes: Grossly normal. Cardiac: S1 and S2 normal, no murmur, click, gallop or rub..  Lungs: clear to auscultation, no wheezes or rales, or unlabored breathing Abdomen: moderate tenderness in the in the RUQ. There is guarding present. . Musculoskeletal: no joint tenderness, deformity or swelling Extremities: Normal muscle tone. All joints with full range of motion. No deformity or tenderness. Neuro:  alert, oriented, normal  speech, no focal findings or movement disorder noted, cranial nerves II through XII intact as tested. Skin: Skin color, texture, turgor normal. No rashes or lesions. Psych: normal mood, behavior, speech, dress, and thought processes   Medical Decision Making RUQ Abdominal pain.  Patient was transferred to the CDU to await labs and studies as well as provide symptomatic management.  Labs and pertinent imaging/studies were viewed independently by me. Have reviewed Radiologists interpretations.  She has had U/S. Awaiting these results. Labs reviewed include CBC, CMP, troponin, lipase and urinalysis.  No electrolyte abnormality, renal insufficiency or transaminitis noted.  CBC shows no leukocytosis and no anemia.  Troponin negative, ACS felt less likely.  Lipase normal.  UA does not suggest UTI.    1900.  Reviewed ultrasound. There is no gallstones, gallbladder wall thickening or pericholecystic fluid.  There appears to be a mildly dilated common bile duct.  Plan for CT abdomen and pelvis with contrast to further evaluate  2010.  Patient has head CT.  Reviewed results.  The suspected common bile duct dilatation noted on ultrasound is felt to be artifactual.  There is no acute abnormalities within the abdomen.  Considered within the differential but after reviewing labs and studies the clinical picture is felt less concerning for appendicitis, acute cholecystitis, ascending cholangitis, choledocholithiasis, biliary colic, volvulus, SBO/LBO, intestinal perforation, incarcerated hernia, acute diverticulitis, Inflammatory/infectious colitis, acute pancreatitis, PUD/acute gastritis, ruptured AAA, ischemic colitis, mesenteric ischemia, nephrolithiasis, acute urinary retention, acute cystitis or pyelonephritis, Addison's crisis or zoster.  Will place referral for gastroenterology follow-up.  Suspect patient could have peptic ulcer disease.  Will start patient on omeprazole twice daily.  Recommend close follow-up  with PCP as well.    Final Clinical Impression / Plan 1. Right upper quadrant abdominal pain      ED Disposition     ED Disposition  Discharge   Condition  Stable   Comment  --        If discharged home: After Visit Summary given to the patient at the time of discharge Discussed diagnosis, treatment plan and indications in which she should return to the ED. Patient felt stable for discharge home  Discharge Medication List as of 12/15/2024  8:11 PM     START taking these medications   Details  omeprazole (PriLOSEC) 20 mg DR capsule Take 1 capsule (20 mg total) by mouth 2 (two) times a day., Starting Sun 12/15/2024, Until Sun 12/29/2024, Normal        Outpatient Follow-up Recommendations: Dorette Joette Loron, MD [address] [place] [address] [phone]       This document has been prepared by the Dragon voice recognition system. Please contact me with any questions.            [1] Past Medical History: Diagnosis Date   Asthma (CMD)    GERD (gastroesophageal reflux disease)   [2] No family history on file. [3] Social History Tobacco Use   Smoking status: Never   Smokeless tobacco: Never  Substance Use Topics   Alcohol use: No   Drug use: No

## 2024-12-18 ENCOUNTER — Other Ambulatory Visit (HOSPITAL_COMMUNITY)
Admission: RE | Admit: 2024-12-18 | Discharge: 2024-12-18 | Disposition: A | Discharge status: Home / Self Care | Source: Ambulatory Visit | Attending: Family Medicine | Admitting: Family Medicine

## 2024-12-18 ENCOUNTER — Encounter: Payer: Self-pay | Admitting: Family Medicine

## 2024-12-18 ENCOUNTER — Ambulatory Visit: Admitting: Family Medicine

## 2024-12-18 VITALS — BP 112/70 | HR 69 | Resp 16 | Ht 65.0 in | Wt 125.4 lb

## 2024-12-18 DIAGNOSIS — R140 Abdominal distension (gaseous): Secondary | ICD-10-CM

## 2024-12-18 DIAGNOSIS — R300 Dysuria: Secondary | ICD-10-CM

## 2024-12-18 DIAGNOSIS — N898 Other specified noninflammatory disorders of vagina: Secondary | ICD-10-CM | POA: Diagnosis present

## 2024-12-18 DIAGNOSIS — R1011 Right upper quadrant pain: Secondary | ICD-10-CM

## 2024-12-18 DIAGNOSIS — R110 Nausea: Secondary | ICD-10-CM

## 2024-12-18 LAB — POCT URINALYSIS DIPSTICK
Glucose, UA: NEGATIVE
Nitrite, UA: NEGATIVE
Protein, UA: NEGATIVE
Spec Grav, UA: 1.025
Urobilinogen, UA: 0.2 U/dL
pH, UA: 5

## 2024-12-18 MED ORDER — PROMETHAZINE HCL 12.5 MG PO TABS: 12.5000 mg | ORAL_TABLET | Freq: Three times a day (TID) | ORAL | 0 refills | Status: AC | PRN

## 2024-12-18 NOTE — Progress Notes (Signed)
 Name: Jessica Carroll   MRN: [id number]    DOB: September 25, 1971   Date:12/18/2024       Progress Note  Subjective  Chief Complaint  Chief Complaint  Patient presents with   Dysuria    Few days ago and Went to ER for RUQ abdominal pain   Discussed the use of AI scribe software for clinical note transcription with the patient, who gave verbal consent to proceed.  History of Present Illness Jessica Carroll is a 54 year old female who presents with recurrent abdominal pain and bloating.  She has been experiencing recurrent abdominal pain primarily in the upper abdomen, which began prior to her visit to the emergency room on Sunday night. The pain starts in the stomach area and sometimes radiates to the chest. On Saturday night, she began vomiting and spent Sunday in bed due to feeling unwell. The pain intensified, leading her to suspect a gallbladder issue, prompting a visit to the emergency room. At the hospital, she underwent an EKG, which showed an age-undetermined STNT abnormality, but cardiac enzymes were negative. An ultrasound revealed a simple cyst on the liver and no gallstones. A CT scan showed an enlarged common bile duct, but liver enzymes were normal.  She has a history of constipation and reports feeling bloated after eating, describing it as feeling 'like I'm three months pregnant.' She has been taking lansoprazole  but was advised to switch to omeprazole 20 mg daily.  She also reports urinary symptoms, including cloudy urine with a peculiar smell, and a history of bacterial vaginosis treated in November. She experiences a burning sensation and mild vaginal discharge, suspecting a recurrent infection. No fever or chills.    Patient Active Problem List   Diagnosis Date Noted   Irritable bowel syndrome with constipation 10/15/2024   Vitamin D  deficiency 10/15/2024   Menopausal symptom 10/15/2024   Migraine with aura and without status migrainosus, not intractable 09/30/2022    Psychophysiological insomnia 09/30/2022   Perennial allergic rhinitis with seasonal variation 09/30/2022   Spasm of muscle of lower back 09/30/2022   Age-related nuclear cataract of right eye 01/19/2021   Corneal scar, right eye 01/19/2021   Intermittent low back pain 10/27/2020   Moderate episode of recurrent major depressive disorder (HCC) 12/20/2018   Iron deficiency anemia 12/19/2018   Type A blood, Rh positive 08/22/2017   S/P vaginal hysterectomy 07/31/2017   Recurrent vaginitis 01/10/2017   Hemorrhoids, external without complications 09/26/2016   Nausea in adult patient 02/24/2016   Endometriosis determined by laparoscopy 01/25/2016   Chronic constipation 08/14/2015   Bilateral ganglion cysts of wrists 08/14/2015   Mild mitral regurgitation 05/19/2015   B12 deficiency 05/18/2015   Asthma, moderate 05/18/2015   Anxiety 05/18/2015   Insomnia, persistent 05/18/2015   Eczema of hand 05/18/2015   Viral keratitis 05/18/2015   Migraine without aura and without status migrainosus, not intractable 05/18/2015   Numerous moles 05/18/2015   Allergic rhinitis 05/18/2015   History of corneal transplant 05/18/2015   Gastroesophageal reflux disease without esophagitis 12/16/2009    Past Surgical History:  Procedure Laterality Date   COLONOSCOPY WITH PROPOFOL  N/A 09/29/2017   Procedure: COLONOSCOPY WITH PROPOFOL ;  Surgeon: Therisa Bi, MD;  Location: [facility] ENDOSCOPY;  Service: Gastroenterology;  Laterality: N/A;   CORNEAL TRANSPLANT Right 01/07/2013   ESOPHAGOGASTRODUODENOSCOPY (EGD) WITH PROPOFOL  N/A 09/29/2017   Procedure: ESOPHAGOGASTRODUODENOSCOPY (EGD) WITH PROPOFOL ;  Surgeon: Therisa Bi, MD;  Location: [facility] ENDOSCOPY;  Service: Gastroenterology;  Laterality: N/A;   ESOPHAGOGASTRODUODENOSCOPY (EGD) WITH  PROPOFOL  N/A 01/13/2023   Procedure: ESOPHAGOGASTRODUODENOSCOPY (EGD) WITH PROPOFOL ;  Surgeon: Therisa Bi, MD;  Location: [facility] ENDOSCOPY;  Service: Gastroenterology;  Laterality: N/A;    EYE SURGERY     eyelid surgery Right June 19, 2015   [facility] by Dr. Greig Gay   LAPAROSCOPIC BILATERAL SALPINGECTOMY N/A 01/25/2016   Procedure: LAPAROSCOPIC BILATERAL SALPINGECTOMY, ADHESIOLYSIS, LAPAROSCOPIC EXCISION/FULGERATION ENDOMETRIOSIS ;  Surgeon: Gladis DELENA Dollar, MD;  Location: [facility] ORS;  Service: Gynecology;  Laterality: N/A;   Laparoscopic excision and fulguration of endometriosis N/A    bladder flap and uterosacral ligament endometriosis, excised and cauterized   UPPER GI ENDOSCOPY  07/19/2012   VAGINAL HYSTERECTOMY N/A 07/31/2017   Procedure: HYSTERECTOMY VAGINAL;  Surgeon: Dollar Gladis DELENA, MD;  Location: [facility] ORS;  Service: Gynecology;  Laterality: N/A;    Family History  Adopted: Yes  Problem Relation Age of Onset   Hyperlipidemia Mother    Hypertension Mother    Diabetes Father    Heart failure Father 31       hear attack   Cancer Neg Hx    Breast cancer Neg Hx     Social History   Tobacco Use   Smoking status: Never   Smokeless tobacco: Never  Substance Use Topics   Alcohol use: Not Currently    Alcohol/week: 0.0 standard drinks of alcohol    Comment: occ. wine    Current Medications[1]  Allergies[2]  I personally reviewed active problem list, medication list, allergies with the patient/caregiver today.   ROS  Ten systems reviewed and is negative except as mentioned in HPI    Objective Physical Exam CONSTITUTIONAL: Patient appears well-developed and well-nourished. No distress. HEENT: Head atraumatic, normocephalic, neck supple. CARDIOVASCULAR: Normal rate, regular rhythm and normal heart sounds. No murmur heard. No BLE edema. PULMONARY: Effort normal and breath sounds normal. No respiratory distress. ABDOMINAL: Abdomen soft, non-tender, no CVA tenderness. No distention. MUSCULOSKELETAL: Normal gait. Without gross motor or sensory deficit. PSYCHIATRIC: Patient has a normal mood and affect. Behavior is normal. Judgment and thought  content normal.  Vitals:   12/18/24 1017  BP: 112/70  Pulse: 69  Resp: 16  SpO2: 99%  Weight: 125 lb 6.4 oz (56.9 kg)  Height: 5' 5 (1.651 m)    Body mass index is 20.87 kg/m.  Recent Results (from the past 2160 hours)  POCT urinalysis dipstick     Status: Abnormal   Collection Time: 10/15/24  2:40 PM  Result Value Ref Range   Color, UA Yellow    Clarity, UA Cloudy    Glucose, UA Negative Negative   Bilirubin, UA Negative    Ketones, UA Negative    Spec Grav, UA 1.020 1.010 - 1.025   Blood, UA Moderate    pH, UA 6.0 5.0 - 8.0   Protein, UA Negative Negative   Urobilinogen, UA 0.2 0.2 or 1.0 E.U./dL   Nitrite, UA Negative    Leukocytes, UA Moderate (2+) (A) Negative   Appearance yellow    Odor foul   Cervicovaginal ancillary only     Status: Abnormal   Collection Time: 10/15/24  3:14 PM  Result Value Ref Range   Trichomonas Negative    Bacterial Vaginitis (gardnerella) Positive (A)    Candida Vaginitis Negative    Candida Glabrata Negative    Comment      Normal Reference Range Bacterial Vaginosis - Negative   Comment Normal Reference Range Candida Species - Negative    Comment Normal Reference Range Candida Exie -  Negative    Comment Normal Reference Range Trichomonas - Negative   Urine Culture     Status: None   Collection Time: 10/15/24  3:21 PM   Specimen: Urine  Result Value Ref Range   MICRO NUMBER: [id number]    SPECIMEN QUALITY: Adequate    Sample Source URINE    STATUS: FINAL    Result: No Growth   Iron, TIBC and Ferritin Panel     Status: Abnormal   Collection Time: 10/15/24  3:22 PM  Result Value Ref Range   Iron 45 45 - 160 mcg/dL   TIBC 682 749 - 549 mcg/dL (calc)   %SAT 14 (L) 16 - 45 % (calc)   Ferritin 142 16 - 232 ng/mL  B12 and Folate Panel     Status: None   Collection Time: 10/15/24  3:22 PM  Result Value Ref Range   Vitamin B-12 215 200 - 1,100 pg/mL    Comment: . Please Note: Although the reference range for vitamin B12 is  [phone] pg/mL, it has been reported that between 5 and 10% of patients with values between 200 and 400 pg/mL may experience neuropsychiatric and hematologic abnormalities due to occult B12 deficiency; less than 1% of patients with values above 400 pg/mL will have symptoms. .    Folate 10.9 ng/mL    Comment:                            Reference Range                            Low:           <3.4                            Borderline:    3.4-5.4                            Normal:        >5.4 .   CBC with Differential/Platelet     Status: Abnormal   Collection Time: 10/15/24  3:22 PM  Result Value Ref Range   WBC 6.5 3.8 - 10.8 Thousand/uL   RBC 3.57 (L) 3.80 - 5.10 Million/uL   Hemoglobin 11.0 (L) 11.7 - 15.5 g/dL   HCT 66.4 (L) 64.9 - 54.9 %   MCV 93.8 80.0 - 100.0 fL   MCH 30.8 27.0 - 33.0 pg   MCHC 32.8 32.0 - 36.0 g/dL    Comment: For adults, a slight decrease in the calculated MCHC value (in the range of 30 to 32 g/dL) is most likely not clinically significant; however, it should be interpreted with caution in correlation with other red cell parameters and the patient's clinical condition.    RDW 12.3 11.0 - 15.0 %   Platelets 212 140 - 400 Thousand/uL   MPV 12.3 7.5 - 12.5 fL   Neutro Abs 3,393 1,500 - 7,800 cells/uL   Absolute Lymphocytes 2,548 850 - 3,900 cells/uL   Absolute Monocytes 390 200 - 950 cells/uL   Eosinophils Absolute 117 15 - 500 cells/uL   Basophils Absolute 52 0 - 200 cells/uL   Neutrophils Relative % 52.2 %   Total Lymphocyte 39.2 %   Monocytes Relative 6.0 %   Eosinophils Relative 1.8 %   Basophils Relative 0.8 %  POCT urinalysis dipstick     Status: Abnormal   Collection Time: 12/18/24 10:43 AM  Result Value Ref Range   Color, UA yellow    Clarity, UA cloudy    Glucose, UA Negative Negative   Bilirubin, UA small    Ketones, UA small    Spec Grav, UA 1.025 1.010 - 1.025   Blood, UA small    pH, UA 5.0 5.0 - 8.0   Protein, UA Negative  Negative   Urobilinogen, UA 0.2 0.2 or 1.0 E.U./dL   Nitrite, UA Negative    Leukocytes, UA Moderate (2+) (A) Negative   Appearance yellow    Odor foul     Diabetic Foot Exam:     PHQ2/9:    12/18/2024   10:16 AM 10/15/2024    2:15 PM 04/15/2024    1:16 PM 03/08/2024    3:16 PM 01/23/2024    8:10 AM  Depression screen PHQ 2/9  Decreased Interest 0 0 0 0 0  Down, Depressed, Hopeless 0 0 0 0 0  PHQ - 2 Score 0 0 0 0 0  Altered sleeping  0 0 0   Tired, decreased energy  0 0 0   Change in appetite  0 0 0   Feeling bad or failure about yourself   0 0 0   Trouble concentrating  0 0 0   Moving slowly or fidgety/restless  0 0 0   Suicidal thoughts  0 0 0   PHQ-9 Score  0  0  0    Difficult doing work/chores  Not difficult at all Not difficult at all Not difficult at all      Data saved with a previous flowsheet row definition    phq 9 is negative  Fall Risk:    12/18/2024   10:16 AM 10/15/2024    2:15 PM 04/15/2024    1:13 PM 01/23/2024    8:10 AM 11/30/2023    8:15 AM  Fall Risk   Falls in the past year? 0 0 0 0 0  Number falls in past yr: 0 0 0 0 0  Injury with Fall? 0 0  0  0  0   Risk for fall due to : No Fall Risks No Fall Risks No Fall Risks No Fall Risks No Fall Risks  Follow up Falls evaluation completed Falls evaluation completed Falls prevention discussed;Education provided;Falls evaluation completed Falls evaluation completed Falls prevention discussed;Education provided;Falls evaluation completed     Data saved with a previous flowsheet row definition     Assessment & Plan Recurrent right upper quadrant abdominal pain with bloating and nausea Recurrent pain with bloating and nausea. Simple liver cyst, no gallstones, slightly enlarged common bile duct. Positive Murphy's sign. Differential includes biliary colic and gastritis. - Ordered HIDA scan to assess gallbladder function. - Referred to gastroenterologist for further evaluation and possible endoscopy. -  Switched from lansoprazole  to omeprazole 20 mg daily. - Prescribed nausea medication for as-needed use.  Dysuria Cloudy urine with pus, no nitrates or protein, suggesting possible infection. - Ordered urine culture to identify bacterial infection.  Recurrent vaginal discharge Recurrent discharge with history of bacterial vaginosis. Current symptoms suggest possible recurrence. - Performed swab to check for bacterial vaginosis, yeast, trichomonas, gonorrhea, and chlamydia. - Await culture results before initiating treatment.  Chronic constipation Seeking second opinion due to dissatisfaction with current care. - Referred to gastroenterologist for further evaluation and management.        [1]  Current Outpatient  Medications:    albuterol  (VENTOLIN  HFA) 108 (90 Base) MCG/ACT inhaler, Inhale 2 puffs into the lungs every 6 (six) hours as needed for wheezing or shortness of breath., Disp: 18 g, Rfl: 0   baclofen  (LIORESAL ) 10 MG tablet, Take 1 tablet (10 mg total) by mouth 3 (three) times daily as needed for muscle spasms., Disp: 90 each, Rfl: 0   carboxymethylcellul-glycerin (REFRESH OPTIVE) 0.5-0.9 % ophthalmic solution, Frequency:PHARMDIR   Dosage:0.0     Instructions:  Note:6 x OU Dose: 0.5%-0.9%, Disp: , Rfl:    cetirizine (ZYRTEC) 5 MG tablet, Take by mouth., Disp: , Rfl:    cyanocobalamin  (VITAMIN B12) 1000 MCG/ML injection, Inject 1 mL (1,000 mcg total) into the muscle every 14 (fourteen) days., Disp: 2 mL, Rfl: 5   estradiol  (ESTRACE  VAGINAL) 0.1 MG/GM vaginal cream, Place 0.5 Applicatorfuls vaginally at bedtime. Apply daily times 2 weeks, then taper to 1 or 2 times per week, Disp: 42.5 g, Rfl: 12   fluconazole  (DIFLUCAN ) 150 MG tablet, Take 1 tablet (150 mg total) by mouth every other day., Disp: 3 tablet, Rfl: 0   fluticasone  furoate-vilanterol (BREO ELLIPTA ) 100-25 MCG/ACT AEPB, INL 1 PUFF ITL QD, Disp: , Rfl:    fluticasone -salmeterol (WIXELA INHUB ) 100-50 MCG/ACT AEPB, Inhale 1  puff into the lungs 2 (two) times daily., Disp: 60 each, Rfl: 5   ibuprofen  (ADVIL ) 800 MG tablet, Take 1 tablet (800 mg total) by mouth every 8 (eight) hours as needed., Disp: 90 tablet, Rfl: 0   lansoprazole  (PREVACID ) 30 MG capsule, Take 1 capsule (30 mg total) by mouth daily at 12 noon., Disp: 90 capsule, Rfl: 0   montelukast  (SINGULAIR ) 10 MG tablet, Take 1 tablet (10 mg total) by mouth at bedtime., Disp: 90 tablet, Rfl: 1   MULTIPLE VITAMIN PO, Take 1 tablet by mouth daily., Disp: , Rfl:    omeprazole (PRILOSEC) 20 MG capsule, Take 20 mg by mouth., Disp: , Rfl:    Plecanatide  (TRULANCE ) 3 MG TABS, Take 1 tablet (3 mg total) by mouth daily., Disp: 90 tablet, Rfl: 3   prednisoLONE acetate (PRED FORTE) 1 % ophthalmic suspension, Place 1 drop into the right eye daily., Disp: , Rfl:    promethazine  (PHENERGAN ) 12.5 MG tablet, Take 1 tablet (12.5 mg total) by mouth every 8 (eight) hours as needed for nausea or vomiting., Disp: 20 tablet, Rfl: 0   rifaximin  (XIFAXAN ) 550 MG TABS tablet, Take 550 mg by mouth., Disp: , Rfl:    triamcinolone  cream (KENALOG ) 0.1 %, Apply 1 Application topically 2 (two) times daily as needed., Disp: 80 g, Rfl: 0   Ubrogepant  (UBRELVY ) 100 MG TABS, Take 1 tablet (100 mg total) by mouth every other day., Disp: 16 tablet, Rfl: 5   valACYclovir  (VALTREX ) 500 MG tablet, Take 1 tablet by mouth 2 (two) times daily., Disp: , Rfl: 0 [2]  Allergies Allergen Reactions   Bactrim  [Sulfamethoxazole -Trimethoprim ] Nausea Only   Penicillins Nausea Only   Topamax  [Topiramate ] Other (See Comments)    Fatigue

## 2024-12-19 ENCOUNTER — Encounter: Payer: Self-pay | Admitting: Family Medicine

## 2024-12-19 LAB — CERVICOVAGINAL ANCILLARY ONLY
Bacterial Vaginitis (gardnerella): POSITIVE — AB
Candida Glabrata: NEGATIVE
Candida Vaginitis: NEGATIVE
Chlamydia: NEGATIVE
Comment: NEGATIVE
Comment: NEGATIVE
Comment: NEGATIVE
Comment: NEGATIVE
Comment: NEGATIVE
Comment: NORMAL
Neisseria Gonorrhea: NEGATIVE
Trichomonas: NEGATIVE

## 2024-12-19 LAB — URINE CULTURE
MICRO NUMBER:: 17438205
Result:: NO GROWTH
SPECIMEN QUALITY:: ADEQUATE

## 2024-12-20 ENCOUNTER — Other Ambulatory Visit: Payer: Self-pay | Admitting: Family Medicine

## 2024-12-20 ENCOUNTER — Ambulatory Visit: Payer: Self-pay | Admitting: Family Medicine

## 2024-12-20 MED ORDER — CLINDAMYCIN PHOSPHATE 2 % VA CREA: 1.0000 | TOPICAL_CREAM | Freq: Every day | VAGINAL | 0 refills | Status: AC

## 2024-12-20 MED ORDER — METRONIDAZOLE 250 MG PO TABS: 250.0000 mg | ORAL_TABLET | Freq: Two times a day (BID) | ORAL | 0 refills | Status: AC

## 2024-12-20 MED ORDER — FLUCONAZOLE 150 MG PO TABS: 150.0000 mg | ORAL_TABLET | ORAL | 0 refills | Status: AC

## 2024-12-23 ENCOUNTER — Telehealth: Payer: Self-pay

## 2024-12-23 ENCOUNTER — Encounter: Payer: Self-pay | Admitting: Family Medicine

## 2024-12-23 NOTE — Telephone Encounter (Signed)
 Per pt would like to be seen here instead of going back to [facility]. Pt stats  she saw  [facility] on 11-25-2024 and now would like to be seen by [facility]. PT was upset and didn't think tat [facility]  listen to anything she was telling them.  Is it ok that I add pt on to be seen or is this classified as a 2nd opinion and are we doing 2nd opinion?

## 2024-12-24 NOTE — Telephone Encounter (Signed)
 Per pt would like to transfer care  back to [facility]. Pt explained that she only went to [facility] on 11-20-2024 to get refill. Pt wasn't sure if we had MD at the time when she made the appt with [facility]. Pt received letter explaining  we had new MD . Pt call to transfer service back to [facility].

## 2024-12-25 ENCOUNTER — Encounter: Payer: Self-pay | Admitting: Family Medicine

## 2024-12-26 ENCOUNTER — Encounter: Payer: Self-pay | Admitting: Family Medicine

## 2024-12-26 ENCOUNTER — Ambulatory Visit: Admitting: Licensed Practical Nurse

## 2024-12-26 ENCOUNTER — Encounter
Admission: RE | Admit: 2024-12-26 | Discharge: 2024-12-26 | Disposition: A | Discharge status: Home / Self Care | Source: Ambulatory Visit | Attending: Family Medicine | Admitting: Family Medicine

## 2024-12-26 DIAGNOSIS — R1011 Right upper quadrant pain: Secondary | ICD-10-CM | POA: Diagnosis present

## 2024-12-26 MED ORDER — TECHNETIUM TC 99M MEBROFENIN IV KIT
5.3700 | PACK | Freq: Once | INTRAVENOUS | Status: AC | PRN
  Administered 2024-12-26: 5.37 via INTRAVENOUS

## 2024-12-27 ENCOUNTER — Encounter: Payer: Self-pay | Admitting: Family Medicine

## 2024-12-29 MED ORDER — OMEPRAZOLE 20 MG PO CPDR: 20.0000 mg | DELAYED_RELEASE_CAPSULE | Freq: Two times a day (BID) | ORAL | 0 refills | Status: AC

## 2025-01-01 ENCOUNTER — Ambulatory Visit

## 2025-01-06 ENCOUNTER — Encounter: Payer: Self-pay | Admitting: Oncology

## 2025-01-13 ENCOUNTER — Ambulatory Visit: Admitting: Surgery

## 2025-01-21 ENCOUNTER — Ambulatory Visit: Admitting: Licensed Practical Nurse

## 2025-01-24 ENCOUNTER — Encounter: Payer: 59 | Admitting: Family Medicine

## 2025-01-27 ENCOUNTER — Ambulatory Visit: Admitting: Surgery

## 2025-02-04 ENCOUNTER — Ambulatory Visit

## 2025-02-27 ENCOUNTER — Ambulatory Visit: Admitting: Gastroenterology

## 2025-04-18 ENCOUNTER — Ambulatory Visit: Admitting: Family Medicine
